# Patient Record
Sex: Female | Born: 1975 | Race: Black or African American | Hispanic: No | Marital: Single | Smoking: Never smoker
Health system: Southern US, Community
[De-identification: ages and names within clinical notes are randomized; demographics above are authoritative.]

## PROBLEM LIST (undated history)

## (undated) DIAGNOSIS — C50919 Malignant neoplasm of unspecified site of unspecified female breast: Secondary | ICD-10-CM

## (undated) DIAGNOSIS — J45909 Unspecified asthma, uncomplicated: Secondary | ICD-10-CM

## (undated) DIAGNOSIS — Z8616 Personal history of COVID-19: Secondary | ICD-10-CM

## (undated) DIAGNOSIS — E785 Hyperlipidemia, unspecified: Secondary | ICD-10-CM

## (undated) DIAGNOSIS — Z923 Personal history of irradiation: Secondary | ICD-10-CM

## (undated) DIAGNOSIS — R51 Headache: Secondary | ICD-10-CM

## (undated) DIAGNOSIS — K219 Gastro-esophageal reflux disease without esophagitis: Secondary | ICD-10-CM

## (undated) DIAGNOSIS — S82852A Displaced trimalleolar fracture of left lower leg, initial encounter for closed fracture: Secondary | ICD-10-CM

## (undated) DIAGNOSIS — R7303 Prediabetes: Secondary | ICD-10-CM

## (undated) DIAGNOSIS — R519 Headache, unspecified: Secondary | ICD-10-CM

## (undated) DIAGNOSIS — I10 Essential (primary) hypertension: Secondary | ICD-10-CM

## (undated) DIAGNOSIS — C801 Malignant (primary) neoplasm, unspecified: Secondary | ICD-10-CM

## (undated) HISTORY — DX: Personal history of COVID-19: Z86.16

## (undated) HISTORY — PX: NO PAST SURGERIES: SHX2092

## (undated) HISTORY — DX: Prediabetes: R73.03

## (undated) HISTORY — DX: Hyperlipidemia, unspecified: E78.5

## (undated) HISTORY — DX: Gastro-esophageal reflux disease without esophagitis: K21.9

## (undated) HISTORY — DX: Malignant neoplasm of unspecified site of unspecified female breast: C50.919

---

## 1998-12-16 DIAGNOSIS — T7840XA Allergy, unspecified, initial encounter: Secondary | ICD-10-CM

## 1998-12-16 HISTORY — DX: Allergy, unspecified, initial encounter: T78.40XA

## 2000-06-05 ENCOUNTER — Other Ambulatory Visit: Admission: RE | Admit: 2000-06-05 | Discharge: 2000-06-05 | Payer: Self-pay | Admitting: Internal Medicine

## 2001-08-13 ENCOUNTER — Other Ambulatory Visit: Admission: RE | Admit: 2001-08-13 | Discharge: 2001-08-13 | Payer: Self-pay | Admitting: Internal Medicine

## 2002-10-06 ENCOUNTER — Other Ambulatory Visit: Admission: RE | Admit: 2002-10-06 | Discharge: 2002-10-06 | Payer: Self-pay | Admitting: Internal Medicine

## 2004-01-13 ENCOUNTER — Other Ambulatory Visit: Admission: RE | Admit: 2004-01-13 | Discharge: 2004-01-13 | Payer: Self-pay | Admitting: Internal Medicine

## 2004-10-17 ENCOUNTER — Ambulatory Visit: Payer: Self-pay | Admitting: Internal Medicine

## 2005-03-11 ENCOUNTER — Other Ambulatory Visit: Admission: RE | Admit: 2005-03-11 | Discharge: 2005-03-11 | Payer: Self-pay | Admitting: Internal Medicine

## 2005-03-11 ENCOUNTER — Ambulatory Visit: Payer: Self-pay | Admitting: Internal Medicine

## 2005-07-16 ENCOUNTER — Ambulatory Visit: Payer: Self-pay | Admitting: Internal Medicine

## 2006-03-20 ENCOUNTER — Ambulatory Visit (HOSPITAL_COMMUNITY): Admission: RE | Admit: 2006-03-20 | Discharge: 2006-03-20 | Payer: Self-pay | Admitting: Obstetrics and Gynecology

## 2006-04-10 ENCOUNTER — Inpatient Hospital Stay (HOSPITAL_COMMUNITY): Admission: AD | Admit: 2006-04-10 | Discharge: 2006-04-10 | Payer: Self-pay | Admitting: *Deleted

## 2006-04-11 ENCOUNTER — Inpatient Hospital Stay (HOSPITAL_COMMUNITY): Admission: AD | Admit: 2006-04-11 | Discharge: 2006-04-12 | Payer: Self-pay | Admitting: Obstetrics and Gynecology

## 2006-04-11 ENCOUNTER — Encounter (INDEPENDENT_AMBULATORY_CARE_PROVIDER_SITE_OTHER): Payer: Self-pay | Admitting: Specialist

## 2006-11-12 ENCOUNTER — Ambulatory Visit: Payer: Self-pay | Admitting: Internal Medicine

## 2006-11-12 LAB — CONVERTED CEMR LAB
Alkaline Phosphatase: 58 units/L (ref 39–117)
Basophils Absolute: 0 10*3/uL (ref 0.0–0.1)
Creatinine, Ser: 0.8 mg/dL (ref 0.4–1.2)
GFR calc non Af Amer: 90 mL/min
Glucose, Bld: 105 mg/dL — ABNORMAL HIGH (ref 70–99)
Hemoglobin: 13.6 g/dL (ref 12.0–15.0)
MCHC: 33.3 g/dL (ref 30.0–36.0)
Monocytes Absolute: 0.4 10*3/uL (ref 0.2–0.7)
Monocytes Relative: 4.5 % (ref 3.0–11.0)
Neutro Abs: 6.5 10*3/uL (ref 1.4–7.7)
Neutrophils Relative %: 73.7 % (ref 43.0–77.0)
Platelets: 263 10*3/uL (ref 150–400)
RBC: 4.48 M/uL (ref 3.87–5.11)
RDW: 13 % (ref 11.5–14.6)
Sodium: 143 meq/L (ref 135–145)
TSH: 0.57 microintl units/mL (ref 0.35–5.50)
Total Bilirubin: 0.9 mg/dL (ref 0.3–1.2)
Triglyceride fasting, serum: 104 mg/dL (ref 0–149)
VLDL: 21 mg/dL (ref 0–40)

## 2006-11-19 ENCOUNTER — Ambulatory Visit: Payer: Self-pay | Admitting: Internal Medicine

## 2008-06-23 ENCOUNTER — Telehealth: Payer: Self-pay | Admitting: *Deleted

## 2008-08-01 ENCOUNTER — Ambulatory Visit: Payer: Self-pay | Admitting: Internal Medicine

## 2008-08-01 DIAGNOSIS — N760 Acute vaginitis: Secondary | ICD-10-CM | POA: Insufficient documentation

## 2008-08-01 DIAGNOSIS — J309 Allergic rhinitis, unspecified: Secondary | ICD-10-CM | POA: Insufficient documentation

## 2008-08-01 DIAGNOSIS — R51 Headache: Secondary | ICD-10-CM

## 2008-08-01 DIAGNOSIS — R519 Headache, unspecified: Secondary | ICD-10-CM | POA: Insufficient documentation

## 2008-08-01 DIAGNOSIS — E669 Obesity, unspecified: Secondary | ICD-10-CM

## 2008-08-01 DIAGNOSIS — B009 Herpesviral infection, unspecified: Secondary | ICD-10-CM | POA: Insufficient documentation

## 2008-08-03 ENCOUNTER — Ambulatory Visit: Payer: Self-pay | Admitting: Internal Medicine

## 2008-08-03 DIAGNOSIS — E786 Lipoprotein deficiency: Secondary | ICD-10-CM | POA: Insufficient documentation

## 2008-08-04 LAB — CONVERTED CEMR LAB
ALT: 12 units/L (ref 0–35)
AST: 14 units/L (ref 0–37)
Albumin: 3.7 g/dL (ref 3.5–5.2)
Alkaline Phosphatase: 49 units/L (ref 39–117)
BUN: 6 mg/dL (ref 6–23)
Basophils Relative: 0.4 % (ref 0.0–3.0)
Chloride: 109 meq/L (ref 96–112)
Cholesterol: 168 mg/dL (ref 0–200)
Creatinine, Ser: 0.8 mg/dL (ref 0.4–1.2)
Eosinophils Absolute: 0.1 10*3/uL (ref 0.0–0.7)
GFR calc Af Amer: 108 mL/min
GFR calc non Af Amer: 89 mL/min
LDL Cholesterol: 127 mg/dL — ABNORMAL HIGH (ref 0–99)
Potassium: 4 meq/L (ref 3.5–5.1)
RBC: 4.34 M/uL (ref 3.87–5.11)
RDW: 13.1 % (ref 11.5–14.6)
Sodium: 142 meq/L (ref 135–145)
TSH: 0.68 microintl units/mL (ref 0.35–5.50)
Total Bilirubin: 0.7 mg/dL (ref 0.3–1.2)
Total Protein: 6.9 g/dL (ref 6.0–8.3)
VLDL: 15 mg/dL (ref 0–40)
WBC: 8.8 10*3/uL (ref 4.5–10.5)

## 2009-08-14 ENCOUNTER — Telehealth: Payer: Self-pay | Admitting: Internal Medicine

## 2009-09-07 ENCOUNTER — Ambulatory Visit: Payer: Self-pay | Admitting: Internal Medicine

## 2009-09-07 DIAGNOSIS — J019 Acute sinusitis, unspecified: Secondary | ICD-10-CM | POA: Insufficient documentation

## 2009-09-07 DIAGNOSIS — J029 Acute pharyngitis, unspecified: Secondary | ICD-10-CM | POA: Insufficient documentation

## 2009-09-22 ENCOUNTER — Ambulatory Visit: Payer: Self-pay | Admitting: Internal Medicine

## 2012-03-24 ENCOUNTER — Emergency Department (HOSPITAL_COMMUNITY): Admission: EM | Admit: 2012-03-24 | Discharge: 2012-03-24 | Payer: Self-pay | Source: Home / Self Care

## 2015-01-30 ENCOUNTER — Encounter (HOSPITAL_COMMUNITY): Payer: Self-pay | Admitting: Emergency Medicine

## 2015-01-30 ENCOUNTER — Emergency Department (HOSPITAL_COMMUNITY): Payer: Medicaid Other

## 2015-01-30 ENCOUNTER — Emergency Department (HOSPITAL_COMMUNITY)
Admission: EM | Admit: 2015-01-30 | Discharge: 2015-01-30 | Disposition: A | Discharge status: Home / Self Care | Payer: Medicaid Other | Attending: Emergency Medicine | Admitting: Emergency Medicine

## 2015-01-30 DIAGNOSIS — R109 Unspecified abdominal pain: Secondary | ICD-10-CM | POA: Insufficient documentation

## 2015-01-30 DIAGNOSIS — Z79899 Other long term (current) drug therapy: Secondary | ICD-10-CM | POA: Insufficient documentation

## 2015-01-30 DIAGNOSIS — Z7982 Long term (current) use of aspirin: Secondary | ICD-10-CM | POA: Insufficient documentation

## 2015-01-30 DIAGNOSIS — Z3202 Encounter for pregnancy test, result negative: Secondary | ICD-10-CM | POA: Insufficient documentation

## 2015-01-30 LAB — CBC
HCT: 39.3 % (ref 36.0–46.0)
Hemoglobin: 13.2 g/dL (ref 12.0–15.0)
MCH: 30.2 pg (ref 26.0–34.0)
MCHC: 33.6 g/dL (ref 30.0–36.0)
MCV: 89.9 fL (ref 78.0–100.0)
PLATELETS: 297 10*3/uL (ref 150–400)
RBC: 4.37 MIL/uL (ref 3.87–5.11)
RDW: 14.1 % (ref 11.5–15.5)
WBC: 14.3 10*3/uL — AB (ref 4.0–10.5)

## 2015-01-30 LAB — URINALYSIS, ROUTINE W REFLEX MICROSCOPIC
BILIRUBIN URINE: NEGATIVE
GLUCOSE, UA: NEGATIVE mg/dL
HGB URINE DIPSTICK: NEGATIVE
Ketones, ur: NEGATIVE mg/dL
NITRITE: NEGATIVE
Protein, ur: NEGATIVE mg/dL
SPECIFIC GRAVITY, URINE: 1.011 (ref 1.005–1.030)
UROBILINOGEN UA: 0.2 mg/dL (ref 0.0–1.0)
pH: 7.5 (ref 5.0–8.0)

## 2015-01-30 LAB — URINE MICROSCOPIC-ADD ON

## 2015-01-30 LAB — BASIC METABOLIC PANEL
Anion gap: 8 (ref 5–15)
BUN: 6 mg/dL (ref 6–23)
CALCIUM: 8.6 mg/dL (ref 8.4–10.5)
CHLORIDE: 107 mmol/L (ref 96–112)
CO2: 23 mmol/L (ref 19–32)
Creatinine, Ser: 0.84 mg/dL (ref 0.50–1.10)
GFR, EST NON AFRICAN AMERICAN: 88 mL/min — AB (ref >90)
GLUCOSE: 103 mg/dL — AB (ref 70–99)
POTASSIUM: 3.9 mmol/L (ref 3.5–5.1)
Sodium: 138 mmol/L (ref 135–145)

## 2015-01-30 LAB — POC URINE PREG, ED: PREG TEST UR: NEGATIVE

## 2015-01-30 MED ORDER — CIPROFLOXACIN HCL 500 MG PO TABS: 500.0000 mg | ORAL_TABLET | Freq: Two times a day (BID) | ORAL | Status: DC

## 2015-01-30 MED ORDER — MORPHINE SULFATE 4 MG/ML IJ SOLN
4.0000 mg | Freq: Once | INTRAMUSCULAR | Status: AC
  Administered 2015-01-30: 4 mg via INTRAVENOUS
  Filled 2015-01-30: qty 1

## 2015-01-30 MED ORDER — HYDROCODONE-ACETAMINOPHEN 5-325 MG PO TABS: 1.0000 | ORAL_TABLET | Freq: Four times a day (QID) | ORAL | Status: DC | PRN

## 2015-01-30 MED ORDER — SODIUM CHLORIDE 0.9 % IV BOLUS (SEPSIS)
1000.0000 mL | Freq: Once | INTRAVENOUS | Status: AC
  Administered 2015-01-30: 1000 mL via INTRAVENOUS

## 2015-01-30 MED ORDER — DEXTROSE 5 % IV SOLN
1.0000 g | Freq: Once | INTRAVENOUS | Status: AC
  Administered 2015-01-30: 1 g via INTRAVENOUS
  Filled 2015-01-30: qty 10

## 2015-01-30 MED ORDER — ONDANSETRON HCL 4 MG PO TABS: 4.0000 mg | ORAL_TABLET | Freq: Four times a day (QID) | ORAL | Status: DC

## 2015-01-30 MED ORDER — ONDANSETRON HCL 4 MG/2ML IJ SOLN
4.0000 mg | Freq: Once | INTRAMUSCULAR | Status: AC
  Administered 2015-01-30: 4 mg via INTRAVENOUS
  Filled 2015-01-30: qty 2

## 2015-01-30 NOTE — ED Notes (Signed)
Per pt, states right flank pain since yesterday, increased urination

## 2015-01-30 NOTE — ED Provider Notes (Signed)
CSN: [id number]     Arrival date & time 01/30/15  1253 History   First MD Initiated Contact with Patient 01/30/15 1311     Chief Complaint  Patient presents with  . Flank Pain     (Consider location/radiation/quality/duration/timing/severity/associated sxs/prior Treatment) HPI   Diane Miranda is a 39 y.o. female complaining of 10 out of 10 right flank pain radiating around to the far lateral side onset yesterday. No pain medication is taken prior to arrival, no exacerbating or alleviating factors identified. Patient reports dysuria, denies urinary frequency, foul-smelling more concentrated urine. She denies fever, chills, nausea, vomiting, abnormal vaginal discharge, prior history of kidney stones, cough, chest pain, shortness of breath, abdominal pain, rash, cervicalgia, headache.  History reviewed. No pertinent past medical history. History reviewed. No pertinent past surgical history. No family history on file. History  Substance Use Topics  . Smoking status: Never Smoker   . Smokeless tobacco: Not on file  . Alcohol Use: No   OB History    No data available     Review of Systems  10 systems reviewed and found to be negative, except as noted in the HPI.   Allergies  Review of patient's allergies indicates no known allergies.  Home Medications   Prior to Admission medications   Medication Sig Start Date End Date Taking? Authorizing Provider  aspirin 325 MG tablet Take 325 mg by mouth once.   Yes Historical Provider, MD  ibuprofen (ADVIL,MOTRIN) 200 MG tablet Take 400 mg by mouth every 6 (six) hours as needed for headache or moderate pain.   Yes Historical Provider, MD  levonorgestrel (MIRENA) 20 MCG/24HR IUD 1 each by Intrauterine route once.   Yes Historical Provider, MD  OVER THE COUNTER MEDICATION Take 1 tablet by mouth daily. protandim   Yes Historical Provider, MD  sodium chloride (OCEAN) 0.65 % SOLN nasal spray Place 2 sprays into both nostrils daily as needed  for congestion.   Yes Historical Provider, MD   BP 149/91 mmHg  Pulse 112  Temp(Src) 98.5 F (36.9 C) (Oral)  Resp 18  SpO2 100%  LMP 01/16/2015 Physical Exam  Constitutional: She is oriented to person, place, and time. She appears well-developed and well-nourished. No distress.  Reclining comfortably in bed, watching TV  HENT:  Head: Normocephalic and atraumatic.  Mouth/Throat: Oropharynx is clear and moist.  Eyes: Conjunctivae and EOM are normal. Pupils are equal, round, and reactive to light.  Neck: Normal range of motion.  Cardiovascular: Normal rate, regular rhythm and intact distal pulses.   Pulmonary/Chest: Effort normal and breath sounds normal. No stridor. No respiratory distress. She has no wheezes. She has no rales. She exhibits no tenderness.  Abdominal: Soft. Bowel sounds are normal. She exhibits no distension and no mass. There is no tenderness. There is no rebound and no guarding.  Genitourinary:  No CVA tenderness to palpation bilaterally  Musculoskeletal: Normal range of motion.  Neurological: She is alert and oriented to person, place, and time.  Psychiatric: She has a normal mood and affect.  Nursing note and vitals reviewed.   ED Course  Procedures (including critical care time) Labs Review Labs Reviewed  CBC - Abnormal; Notable for the following:    WBC 14.3 (*)    All other components within normal limits  BASIC METABOLIC PANEL  URINALYSIS, ROUTINE W REFLEX MICROSCOPIC  POC URINE PREG, ED    Imaging Review No results found.   EKG Interpretation None      MDM  Final diagnoses:  None    Filed Vitals:   01/30/15 1300  BP: 149/91  Pulse: 112  Temp: 98.5 F (36.9 C)  TempSrc: Oral  Resp: 18  SpO2: 100%    Medications  sodium chloride 0.9 % bolus 1,000 mL (1,000 mLs Intravenous New Bag/Given 01/30/15 1333)  morphine 4 MG/ML injection 4 mg (4 mg Intravenous Given 01/30/15 1404)  ondansetron (ZOFRAN) injection 4 mg (4 mg Intravenous  Given 01/30/15 1403)    [name] is a pleasant 39 y.o. female presenting with right flank pain and dysuria onset yesterday. Patient is afebrile and mildly tachycardic at 112. Urinalysis does not appear to be infected, urine culture is ordered. She has a white count of 14.5. Will obtain renal ultrasound to evaluate for hydronephrosis.  Case signed out to PA [place] at shift change: Plan is to follow-up renal ultrasound, recheck vitals.      Monico Blitz, PA-C 01/30/15 [place], MD 01/31/15 [phone]

## 2015-01-30 NOTE — Discharge Instructions (Signed)
Pyelonephritis, Adult Pyelonephritis is a kidney infection. In general, there are 2 main types of pyelonephritis:  Infections that come on quickly without any warning (acute pyelonephritis).  Infections that persist for a long period of time (chronic pyelonephritis). CAUSES  Two main causes of pyelonephritis are:  Bacteria traveling from the bladder to the kidney. This is a problem especially in pregnant women. The urine in the bladder can become filled with bacteria from multiple causes, including:  Inflammation of the prostate gland (prostatitis).  Sexual intercourse in females.  Bladder infection (cystitis).  Bacteria traveling from the bloodstream to the tissue part of the kidney. Problems that may increase your risk of getting a kidney infection include:  Diabetes.  Kidney stones or bladder stones.  Cancer.  Catheters placed in the bladder.  Other abnormalities of the kidney or ureter. SYMPTOMS   Abdominal pain.  Pain in the side or flank area.  Fever.  Chills.  Upset stomach.  Blood in the urine (dark urine).  Frequent urination.  Strong or persistent urge to urinate.  Burning or stinging when urinating. DIAGNOSIS  Your caregiver may diagnose your kidney infection based on your symptoms. A urine sample may also be taken. TREATMENT  In general, treatment depends on how severe the infection is.   If the infection is mild and caught early, your caregiver may treat you with oral antibiotics and send you home.  If the infection is more severe, the bacteria may have gotten into the bloodstream. This will require intravenous (IV) antibiotics and a hospital stay. Symptoms may include:  High fever.  Severe flank pain.  Shaking chills.  Even after a hospital stay, your caregiver may require you to be on oral antibiotics for a period of time.  Other treatments may be required depending upon the cause of the infection. HOME CARE INSTRUCTIONS   Take your  antibiotics as directed. Finish them even if you start to feel better.  Make an appointment to have your urine checked to make sure the infection is gone.  Drink enough fluids to keep your urine clear or pale yellow.  Take medicines for the bladder if you have urgency and frequency of urination as directed by your caregiver. SEEK IMMEDIATE MEDICAL CARE IF:   You have a fever or persistent symptoms for more than 2-3 days.  You have a fever and your symptoms suddenly get worse.  You are unable to take your antibiotics or fluids.  You develop shaking chills.  You experience extreme weakness or fainting.  There is no improvement after 2 days of treatment. MAKE SURE YOU:  Understand these instructions.  Will watch your condition.  Will get help right away if you are not doing well or get worse. Document Released: 12/02/2005 Document Revised: 06/02/2012 Document Reviewed: 05/08/2011 [facility] Patient Information 2015 [place], [place]. This information is not intended to replace advice given to you by your health care provider. Make sure you discuss any questions you have with your health care provider.  Flank Pain Flank pain refers to pain that is located on the side of the body between the upper abdomen and the back. The pain may occur over a short period of time (acute) or may be long-term or reoccurring (chronic). It may be mild or severe. Flank pain can be caused by many things. CAUSES  Some of the more common causes of flank pain include:  Muscle strains.   Muscle spasms.   A disease of your spine (vertebral disk disease).   A  lung infection (pneumonia).   Fluid around your lungs (pulmonary edema).   A kidney infection.   Kidney stones.   A very painful skin rash caused by the chickenpox virus (shingles).   Gallbladder disease.  [place] care will depend on the cause of your pain. In general,  Rest as directed by your  caregiver.  Drink enough fluids to keep your urine clear or pale yellow.  Only take over-the-counter or prescription medicines as directed by your caregiver. Some medicines may help relieve the pain.  Tell your caregiver about any changes in your pain.  Follow up with your caregiver as directed. SEEK IMMEDIATE MEDICAL CARE IF:   Your pain is not controlled with medicine.   You have new or worsening symptoms.  Your pain increases.   You have abdominal pain.   You have shortness of breath.   You have persistent nausea or vomiting.   You have swelling in your abdomen.   You feel faint or pass out.   You have blood in your urine.  You have a fever or persistent symptoms for more than 2-3 days.  You have a fever and your symptoms suddenly get worse. MAKE SURE YOU:   Understand these instructions.  Will watch your condition.  Will get help right away if you are not doing well or get worse. Document Released: 01/23/2006 Document Revised: 08/26/2012 Document Reviewed: 07/16/2012 [facility] Patient Information 2015 [place], [place]. This information is not intended to replace advice given to you by your health care provider. Make sure you discuss any questions you have with your health care provider.

## 2015-02-03 LAB — URINE CULTURE: Colony Count: 80000

## 2015-02-07 ENCOUNTER — Telehealth (HOSPITAL_COMMUNITY): Payer: Self-pay

## 2015-02-07 NOTE — Telephone Encounter (Signed)
Post ED Visit - Positive Culture Follow-up  Culture report reviewed by antimicrobial stewardship pharmacist: []  Wes Redmon, Pharm.D., BCPS []  Heide Guile, Pharm.D., BCPS []  Alycia Rossetti, Pharm.D., BCPS []  [place], Pharm.D., BCPS, AAHIVP [x]  Legrand Como, Pharm.D., BCPS, AAHIVP []  Isac Sarna, Pharm.D., BCPS  Positive Urine culture, >/= 80,000 colonies -> Staph Species Treated with Ciprofloxacin, organism sensitive to the same and no further patient follow-up is required at this time.  Dortha Kern 02/07/2015, 5:27 AM

## 2016-11-19 ENCOUNTER — Ambulatory Visit (HOSPITAL_COMMUNITY)
Admission: EM | Admit: 2016-11-19 | Discharge: 2016-11-19 | Disposition: A | Discharge status: Home / Self Care | Payer: BLUE CROSS/BLUE SHIELD | Attending: Family Medicine | Admitting: Family Medicine

## 2016-11-19 ENCOUNTER — Encounter (HOSPITAL_COMMUNITY): Payer: Self-pay | Admitting: Emergency Medicine

## 2016-11-19 DIAGNOSIS — H6503 Acute serous otitis media, bilateral: Secondary | ICD-10-CM | POA: Diagnosis not present

## 2016-11-19 MED ORDER — FLUCONAZOLE 150 MG PO TABS: 150.0000 mg | ORAL_TABLET | Freq: Every day | ORAL | 0 refills | Status: DC

## 2016-11-19 MED ORDER — AMOXICILLIN 875 MG PO TABS: 875.0000 mg | ORAL_TABLET | Freq: Two times a day (BID) | ORAL | 0 refills | Status: DC

## 2016-11-19 MED ORDER — IPRATROPIUM BROMIDE 0.06 % NA SOLN: 2.0000 | Freq: Four times a day (QID) | NASAL | 0 refills | Status: DC

## 2016-11-19 NOTE — ED Triage Notes (Signed)
See provider note. 

## 2016-11-19 NOTE — ED Provider Notes (Signed)
CSN: [id number]     Arrival date & time 11/19/16  1848 History   First MD Initiated Contact with Patient 11/19/16 1935     No chief complaint on file.  (Consider location/radiation/quality/duration/timing/severity/associated sxs/prior Treatment) Patient c/o decreased hearing acuity and congestion.   The history is provided by the patient.    No past medical history on file. No past surgical history on file. No family history on file. Social History  Substance Use Topics  . Smoking status: Never Smoker  . Smokeless tobacco: Not on file  . Alcohol use No   OB History    No data available     Review of Systems  Constitutional: Negative.   HENT: Positive for ear pain, postnasal drip, sneezing and sore throat.   Eyes: Negative.   Respiratory: Negative.   Cardiovascular: Negative.   Gastrointestinal: Negative.   Endocrine: Negative.   Genitourinary: Negative.   Musculoskeletal: Negative.   Allergic/Immunologic: Negative.   Neurological: Negative.   Hematological: Negative.   Psychiatric/Behavioral: Negative.     Allergies  Patient has no known allergies.  Home Medications   Prior to Admission medications   Medication Sig Start Date End Date Taking? Authorizing Provider  amoxicillin (AMOXIL) 875 MG tablet Take 1 tablet (875 mg total) by mouth 2 (two) times daily. 11/19/16   Lysbeth Penner, FNP  aspirin 325 MG tablet Take 325 mg by mouth once.    Historical Provider, MD  ciprofloxacin (CIPRO) 500 MG tablet Take 1 tablet (500 mg total) by mouth 2 (two) times daily. One po bid x 7 days 01/30/15   Dahlia Bailiff, PA-C  HYDROcodone-acetaminophen (NORCO/VICODIN) 5-325 MG per tablet Take 1-2 tablets by mouth every 6 (six) hours as needed for moderate pain or severe pain. 01/30/15   Dahlia Bailiff, PA-C  ibuprofen (ADVIL,MOTRIN) 200 MG tablet Take 400 mg by mouth every 6 (six) hours as needed for headache or moderate pain.    Historical Provider, MD  ipratropium (ATROVENT) 0.06 % nasal  spray Place 2 sprays into both nostrils 4 (four) times daily. 11/19/16   Lysbeth Penner, FNP  levonorgestrel (MIRENA) 20 MCG/24HR IUD 1 each by Intrauterine route once.    Historical Provider, MD  ondansetron (ZOFRAN) 4 MG tablet Take 1 tablet (4 mg total) by mouth every 6 (six) hours. 01/30/15   Dahlia Bailiff, PA-C  OVER THE COUNTER MEDICATION Take 1 tablet by mouth daily. protandim    Historical Provider, MD  sodium chloride (OCEAN) 0.65 % SOLN nasal spray Place 2 sprays into both nostrils daily as needed for congestion.    Historical Provider, MD   Meds Ordered and Administered this Visit  Medications - No data to display  BP 151/93 (BP Location: Left Arm)   Pulse 89   Temp 99 F (37.2 C) (Oral)   Resp 14   SpO2 98%  No data found.   Physical Exam  Constitutional: She is oriented to person, place, and time. She appears well-developed and well-nourished.  HENT:  Head: Normocephalic and atraumatic.  Right Ear: External ear normal.  Left Ear: External ear normal.  Mouth/Throat: Oropharynx is clear and moist.  Eyes: Conjunctivae and EOM are normal. Pupils are equal, round, and reactive to light.  Neck: Normal range of motion. Neck supple.  Cardiovascular: Normal rate, regular rhythm and normal heart sounds.   Pulmonary/Chest: Effort normal and breath sounds normal.  Abdominal: Soft. Bowel sounds are normal.  Neurological: She is alert and oriented to person, place, and time.  Nursing note and vitals reviewed.   Urgent Care Course   Clinical Course     Procedures (including critical care time)  Labs Review Labs Reviewed - No data to display  Imaging Review No results found.   Visual Acuity Review  Right Eye Distance:   Left Eye Distance:   Bilateral Distance:    Right Eye Near:   Left Eye Near:    Bilateral Near:         MDM   1. Bilateral acute serous otitis media, recurrence not specified    Amoxicillin 875mg  one po bid x 7 days #14 Diflucan 150mg  one po  qd #2 Push po fluids, rest, tylenol and motrin otc prn as directed for fever, arthralgias, and myalgias.  Follow up prn if sx's continue or persist.    Lysbeth Penner, FNP 11/19/16 1949

## 2016-12-16 HISTORY — PX: FRACTURE SURGERY: SHX138

## 2017-07-04 ENCOUNTER — Emergency Department (HOSPITAL_COMMUNITY)
Admission: EM | Admit: 2017-07-04 | Discharge: 2017-07-05 | Disposition: A | Discharge status: Home / Self Care | Payer: No Typology Code available for payment source | Attending: Emergency Medicine | Admitting: Emergency Medicine

## 2017-07-04 ENCOUNTER — Encounter (HOSPITAL_COMMUNITY): Payer: Self-pay | Admitting: Emergency Medicine

## 2017-07-04 ENCOUNTER — Emergency Department (HOSPITAL_COMMUNITY): Payer: No Typology Code available for payment source

## 2017-07-04 DIAGNOSIS — Z79899 Other long term (current) drug therapy: Secondary | ICD-10-CM | POA: Diagnosis not present

## 2017-07-04 DIAGNOSIS — S82842A Displaced bimalleolar fracture of left lower leg, initial encounter for closed fracture: Secondary | ICD-10-CM | POA: Insufficient documentation

## 2017-07-04 DIAGNOSIS — S92255A Nondisplaced fracture of navicular [scaphoid] of left foot, initial encounter for closed fracture: Secondary | ICD-10-CM | POA: Diagnosis not present

## 2017-07-04 DIAGNOSIS — S99922A Unspecified injury of left foot, initial encounter: Secondary | ICD-10-CM | POA: Diagnosis present

## 2017-07-04 DIAGNOSIS — X500XXA Overexertion from strenuous movement or load, initial encounter: Secondary | ICD-10-CM | POA: Diagnosis not present

## 2017-07-04 DIAGNOSIS — Y999 Unspecified external cause status: Secondary | ICD-10-CM | POA: Diagnosis not present

## 2017-07-04 DIAGNOSIS — Y9389 Activity, other specified: Secondary | ICD-10-CM | POA: Insufficient documentation

## 2017-07-04 DIAGNOSIS — Y92830 Public park as the place of occurrence of the external cause: Secondary | ICD-10-CM | POA: Insufficient documentation

## 2017-07-04 MED ORDER — KETAMINE HCL-SODIUM CHLORIDE 100-0.9 MG/10ML-% IV SOSY
1.0000 mg/kg | PREFILLED_SYRINGE | Freq: Once | INTRAVENOUS | Status: AC
  Administered 2017-07-04: 100 mg via INTRAVENOUS
  Filled 2017-07-04: qty 20

## 2017-07-04 MED ORDER — SODIUM CHLORIDE 0.9 % IV SOLN
INTRAVENOUS | Status: DC
  Administered 2017-07-04: 23:00:00 via INTRAVENOUS

## 2017-07-04 MED ORDER — PROPOFOL 10 MG/ML IV BOLUS
0.5000 mg/kg | Freq: Once | INTRAVENOUS | Status: AC
  Administered 2017-07-04: 40 mg via INTRAVENOUS
  Filled 2017-07-04: qty 20

## 2017-07-04 MED ORDER — KETAMINE HCL 10 MG/ML IJ SOLN
INTRAMUSCULAR | Status: AC | PRN
  Administered 2017-07-04: 204.2 mg via INTRAVENOUS

## 2017-07-04 MED ORDER — HYDROMORPHONE HCL 1 MG/ML IJ SOLN
1.0000 mg | Freq: Once | INTRAMUSCULAR | Status: AC
  Administered 2017-07-04: 1 mg via INTRAMUSCULAR
  Filled 2017-07-04: qty 1

## 2017-07-04 MED ORDER — PROPOFOL 10 MG/ML IV BOLUS
INTRAVENOUS | Status: AC | PRN
  Administered 2017-07-04: 51.05 mg via INTRAVENOUS

## 2017-07-04 NOTE — ED Provider Notes (Signed)
[facility] DEPT Provider Note   CSN: [id number] Arrival date & time: 07/04/17  2104     History   Chief Complaint Chief Complaint  Patient presents with  . Foot Injury    HPI Diane Miranda is a 41 y.o. female who presents with L ankle and foot pain. She states she was with her family at movie night at [employer] when the movie screen fell and landed on her L ankle and foot. She had immediate pain and swelling and heard a "pop" when she tried to get out of the way. She was unable to bear weight after the incident. She denies numbness and is able to wiggle all her toes.  HPI  History reviewed. No pertinent past medical history.  Patient Active Problem List   Diagnosis Date Noted  . SINUSITIS - ACUTE-NOS 09/07/2009  . EXUDATIVE PHARYNGITIS 09/07/2009  . LOW HDL 08/03/2008  . 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 OBESITY 08/01/2008  . ALLERGIC RHINITIS 08/01/2008  . VAGINITIS 08/01/2008  . HEADACHE 08/01/2008    History reviewed. No pertinent surgical history.  OB History    No data available       Home Medications    Prior to Admission medications   Medication Sig Start Date End Date Taking? Authorizing Provider  aspirin 325 MG tablet Take 325 mg by mouth once.    [provider]  ibuprofen (ADVIL,MOTRIN) 200 MG tablet Take 400 mg by mouth every 6 (six) hours as needed for headache or moderate pain.    [provider]  ipratropium (ATROVENT) 0.06 % nasal spray Place 2 sprays into both nostrils 4 (four) times daily. 11/19/16   Lysbeth Penner, FNP  levonorgestrel (MIRENA) 20 MCG/24HR IUD 1 each by Intrauterine route once.    [provider]  OVER THE COUNTER MEDICATION Take 1 tablet by mouth daily. protandim    [provider]  sodium chloride (OCEAN) 0.65 % SOLN nasal spray Place 2 sprays into both nostrils daily as needed for congestion.    [provider]    Family History History reviewed. No pertinent family  history.  Social History Social History  Substance Use Topics  . Smoking status: Never Smoker  . Smokeless tobacco: Never Used  . Alcohol use No     Allergies   Patient has no known allergies.   Review of Systems Review of Systems  Musculoskeletal: Positive for arthralgias, gait problem and joint swelling.  Skin: Negative for wound.  Neurological: Negative for weakness and numbness.     Physical Exam Updated Vital Signs BP (!) 149/90 (BP Location: Right Arm)   Pulse 94   Temp 99.2 F (37.3 C) (Oral)   Resp 20   Ht 5\' 4"  (1.626 m)   Wt 102.1 kg (225 lb)   LMP 07/02/2017   SpO2 100%   BMI 38.62 kg/m   Physical Exam  Constitutional: She is oriented to person, place, and time. She appears well-developed and well-nourished. No distress.  HENT:  Head: Normocephalic and atraumatic.  Eyes: Pupils are equal, round, and reactive to light. Conjunctivae are normal. Right eye exhibits no discharge. Left eye exhibits no discharge. No scleral icterus.  Neck: Normal range of motion.  Cardiovascular: Normal rate.   Pulmonary/Chest: Effort normal. No respiratory distress.  Abdominal: She exhibits no distension.  Musculoskeletal:  Left ankle and foot: Significant amount of swelling of foot and ankle. Diffuse tenderness to palpation. Able to wiggle all toes. N/V intact.   Neurological: She is alert  and oriented to person, place, and time.  Skin: Skin is warm and dry.  Psychiatric: She has a normal mood and affect. Her behavior is normal.  Nursing note and vitals reviewed.    ED Treatments / Results  Labs (all labs ordered are listed, but only abnormal results are displayed) Labs Reviewed - No data to display  EKG  EKG Interpretation None       Radiology Dg Ankle Complete Left  Result Date: 07/04/2017 CLINICAL DATA:  Initial evaluation for acute trauma. EXAM: LEFT ANKLE COMPLETE - 3+ VIEW COMPARISON:  None. FINDINGS: Acute transverse mildly displaced fracture through  the medial malleolus. Additional acute comminuted oblique fracture through the distal fibula with mild lateral displacement. Posterior malleolus grossly intact. Disruption of the ankle mortise. Extensive soft tissue swelling about the ankle. IMPRESSION: Acute fracture dislocation about the left ankle as above. Electronically Signed   By: Jeannine Boga M.D.   On: 07/04/2017 22:02   Dg Foot Complete Left  Result Date: 07/04/2017 CLINICAL DATA:  Initial evaluation for acute injury. EXAM: LEFT FOOT - COMPLETE 3+ VIEW COMPARISON:  None. FINDINGS: Acute fractures about the ankle, better evaluated on concomitant ankle radiographs. There is linear lucency traversing the medial aspect of the navicular, suspicious for acute nondisplaced fracture. Overlying soft tissue swelling. No other acute fracture or dislocation about the foot. Small plantar calcaneal enthesophyte noted. IMPRESSION: 1. Linear lucency traversing the medial aspect of the navicular, suspicious for acute nondisplaced fracture. 2. Multiple acute fractures about the left ankle, better evaluated on concomitant ankle radiographs. Electronically Signed   By: Jeannine Boga M.D.   On: 07/04/2017 22:00    Procedures Procedures (including critical care time)  Medications Ordered in ED Medications  HYDROmorphone (DILAUDID) injection 1 mg (1 mg Intramuscular Given 07/04/17 2232)  propofol (DIPRIVAN) 10 mg/mL bolus/IV push 51.1 mg (40 mg Intravenous Given 07/04/17 2326)  ketamine 100 mg in normal saline 10 mL (10mg /mL) syringe (100 mg Intravenous Given 07/04/17 2324)  ketamine (KETALAR) injection (204.2 mg Intravenous Given 07/04/17 2324)  propofol (DIPRIVAN) 10 mg/mL bolus/IV push (51.05 mg Intravenous Given 07/04/17 2326)  ondansetron (ZOFRAN) injection 4 mg (4 mg Intravenous Given 07/05/17 0058)  ketorolac (TORADOL) 15 MG/ML injection 15 mg (15 mg Intravenous Given 07/05/17 0058)    Initial Impression / Assessment and Plan / ED Course  I  have reviewed the triage vital signs and the nursing notes.  Pertinent labs & imaging results that were available during my care of the patient were reviewed by me and considered in my medical decision making (see chart for details).  41 year old female with unstable L ankle and foot fx. Shared visit with Dr. Wilson Singer. Spoke with Dr. Doran Durand who recommends reduction, splint, CT of ankle and foot, pain control, elevation and office follow up Monday.   L ankle was reduced and splinted. Crutches were given. Pain rx and Ortho referral given. Return precautions were discussed.  Final Clinical Impressions(s) / ED Diagnoses   Final diagnoses:  Closed bimalleolar fracture, left, initial encounter  Closed nondisplaced fracture of navicular bone of left foot, initial encounter    New Prescriptions Current Discharge Medication List       Iris Pert 07/05/17 1525    Virgel Manifold, MD 07/12/17 1219

## 2017-07-04 NOTE — ED Notes (Signed)
Pt reports she was at [employer] watching a movie, states the screen was falling and she was right under it, so she attempted to get out of the way when she slipped on the sand twisting her L ankle.

## 2017-07-04 NOTE — ED Triage Notes (Signed)
Patient was at family night and a screen was falling. Patient was trying to get out of the way and twisted her left foot.

## 2017-07-04 NOTE — Progress Notes (Signed)
Orthopedic Tech Progress Note Patient Details:  Diane Miranda 28-Mar-1976 [id number]  Assisted reduction performed by Dr. Wilson Singer and PA Janetta Hora. Posterior and stirrup short leg splint applied. Crutches were left in the room, Ortho Tech did not do crutch training as Pt was under conscious sedation. Informed RN to please provide crutch training when Pt is more conscious.  Ortho Devices Type of Ortho Device: Ace wrap, Post (short leg) splint, Stirrup splint, Crutches Ortho Device/Splint Location: Well padded plaster posterior and stirrup splint to Lt Leg. Ortho Device/Splint Interventions: Application   Charlott Rakes 07/04/2017, 11:47 PM

## 2017-07-05 ENCOUNTER — Emergency Department (HOSPITAL_COMMUNITY): Payer: No Typology Code available for payment source

## 2017-07-05 MED ORDER — IBUPROFEN 600 MG PO TABS: 600.0000 mg | ORAL_TABLET | Freq: Four times a day (QID) | ORAL | 0 refills | Status: DC | PRN

## 2017-07-05 MED ORDER — OXYCODONE-ACETAMINOPHEN 5-325 MG PO TABS: 1.0000 | ORAL_TABLET | ORAL | 0 refills | Status: DC | PRN

## 2017-07-05 MED ORDER — KETOROLAC TROMETHAMINE 15 MG/ML IJ SOLN
15.0000 mg | Freq: Once | INTRAMUSCULAR | Status: AC
  Administered 2017-07-05: 15 mg via INTRAVENOUS
  Filled 2017-07-05: qty 1

## 2017-07-05 MED ORDER — ONDANSETRON HCL 4 MG/2ML IJ SOLN
4.0000 mg | Freq: Once | INTRAMUSCULAR | Status: AC
  Administered 2017-07-05: 4 mg via INTRAVENOUS
  Filled 2017-07-05: qty 2

## 2017-07-05 NOTE — ED Provider Notes (Signed)
Medical screening examination/treatment/procedure(s) were conducted as a shared visit with non-physician practitioner(s) and myself.  I personally evaluated the patient during the encounter.   EKG Interpretation None       41yF with closed trimalleolar fx L ankle. Reduced. Remains NVI post-reduction. Splinted. Crutches. PRN pain meds. CT per ortho request. FU with ortho this upcoming week.     Reduction of dislocation Date/Time: 11:00 PM Performed by: Virgel Manifold Authorized by: Virgel Manifold Consent: Verbal consent obtained. Risks and benefits: risks, benefits and alternatives were discussed Consent given by: patient Required items: required blood products, implants, devices, and special equipment available Time out: Immediately prior to procedure a "time out" was called to verify the correct patient, procedure, equipment, support staff and site/side marked as required.  Patient sedated: see MAR  Vitals: Vital signs were monitored during sedation. Patient tolerance: Patient tolerated the procedure well with no immediate complications. Joint: L ankle Reduction technique: axial traction   Procedural Sedation  Preprocedure  Pre-anesthesia/induction confirmation of laterality/correct procedure site including "time-out."  Provider confirms review of the nurses' note, allergies, medications, pertinent labs, PMH, pre-induction vital signs, pulse oximetry, pain level, and ECG (as applicable), and patient condition satisfactory for commencing with order for sedation and procedure.  Medications: Ketamine IV           Propofol IV  Patient tolerated procedure and procedural sedation component as expected without apparent immediate complications.  Physician confirms procedural medication orders as administered, patient was assessed by physician post-procedure, and confirms post-sedation plan of care and disposition.  Total time of sedation/monitoring: 35 minutes   Virgel Manifold,  MD 07/05/17 1447

## 2017-07-05 NOTE — Discharge Instructions (Signed)
Please elevate leg as much as possible this weekend. Leg should be above the level of the heart (toes above nose) Take Ibuprofen three times a day for pain/inflammation Take pain medicine as needed Do not put any weight on left leg  Follow up with Dr. Doran Durand on Monday - call his office Monday morning

## 2017-07-07 ENCOUNTER — Other Ambulatory Visit: Payer: Self-pay | Admitting: Orthopedic Surgery

## 2017-07-08 ENCOUNTER — Encounter (HOSPITAL_BASED_OUTPATIENT_CLINIC_OR_DEPARTMENT_OTHER): Payer: Self-pay | Admitting: *Deleted

## 2017-07-08 ENCOUNTER — Encounter (HOSPITAL_BASED_OUTPATIENT_CLINIC_OR_DEPARTMENT_OTHER)
Admission: RE | Admit: 2017-07-08 | Discharge: 2017-07-08 | Disposition: A | Discharge status: Home / Self Care | Payer: BLUE CROSS/BLUE SHIELD | Source: Ambulatory Visit | Attending: Orthopedic Surgery | Admitting: Orthopedic Surgery

## 2017-07-08 ENCOUNTER — Other Ambulatory Visit: Payer: Self-pay

## 2017-07-08 DIAGNOSIS — S82852A Displaced trimalleolar fracture of left lower leg, initial encounter for closed fracture: Secondary | ICD-10-CM | POA: Diagnosis present

## 2017-07-08 DIAGNOSIS — Z79899 Other long term (current) drug therapy: Secondary | ICD-10-CM | POA: Diagnosis not present

## 2017-07-08 DIAGNOSIS — X58XXXA Exposure to other specified factors, initial encounter: Secondary | ICD-10-CM | POA: Diagnosis not present

## 2017-07-08 DIAGNOSIS — Y929 Unspecified place or not applicable: Secondary | ICD-10-CM | POA: Diagnosis not present

## 2017-07-08 DIAGNOSIS — I10 Essential (primary) hypertension: Secondary | ICD-10-CM | POA: Diagnosis not present

## 2017-07-08 LAB — POCT I-STAT, CHEM 8
BUN: 3 mg/dL — ABNORMAL LOW (ref 6–20)
CALCIUM ION: 1.15 mmol/L (ref 1.15–1.40)
CREATININE: 0.7 mg/dL (ref 0.44–1.00)
Chloride: 104 mmol/L (ref 101–111)
GLUCOSE: 109 mg/dL — AB (ref 65–99)
HCT: 40 % (ref 36.0–46.0)
HEMOGLOBIN: 13.6 g/dL (ref 12.0–15.0)
POTASSIUM: 3.5 mmol/L (ref 3.5–5.1)
Sodium: 139 mmol/L (ref 135–145)
TCO2: 25 mmol/L (ref 0–100)

## 2017-07-08 NOTE — Progress Notes (Signed)
Pt is a difficult stick, S. Coakley RN drew I stat 8 on second attempt in right hand. Pt tolerated well.

## 2017-07-08 NOTE — Progress Notes (Signed)
Reviewed EKG with Dr Tobias Alexander. OK to proceed as planned with surgery

## 2017-07-10 ENCOUNTER — Ambulatory Visit (HOSPITAL_BASED_OUTPATIENT_CLINIC_OR_DEPARTMENT_OTHER): Payer: BLUE CROSS/BLUE SHIELD | Admitting: Certified Registered"

## 2017-07-10 ENCOUNTER — Encounter (HOSPITAL_BASED_OUTPATIENT_CLINIC_OR_DEPARTMENT_OTHER)
Admission: RE | Disposition: A | Discharge status: Home / Self Care | Payer: Self-pay | Source: Ambulatory Visit | Attending: Orthopedic Surgery

## 2017-07-10 ENCOUNTER — Encounter (HOSPITAL_BASED_OUTPATIENT_CLINIC_OR_DEPARTMENT_OTHER): Payer: Self-pay

## 2017-07-10 ENCOUNTER — Ambulatory Visit (HOSPITAL_BASED_OUTPATIENT_CLINIC_OR_DEPARTMENT_OTHER)
Admission: RE | Admit: 2017-07-10 | Discharge: 2017-07-10 | Disposition: A | Discharge status: Home / Self Care | Payer: BLUE CROSS/BLUE SHIELD | Source: Ambulatory Visit | Attending: Orthopedic Surgery | Admitting: Orthopedic Surgery

## 2017-07-10 DIAGNOSIS — X58XXXA Exposure to other specified factors, initial encounter: Secondary | ICD-10-CM | POA: Insufficient documentation

## 2017-07-10 DIAGNOSIS — S82852A Displaced trimalleolar fracture of left lower leg, initial encounter for closed fracture: Secondary | ICD-10-CM | POA: Insufficient documentation

## 2017-07-10 DIAGNOSIS — I10 Essential (primary) hypertension: Secondary | ICD-10-CM | POA: Insufficient documentation

## 2017-07-10 DIAGNOSIS — Y929 Unspecified place or not applicable: Secondary | ICD-10-CM | POA: Insufficient documentation

## 2017-07-10 DIAGNOSIS — Z79899 Other long term (current) drug therapy: Secondary | ICD-10-CM | POA: Insufficient documentation

## 2017-07-10 HISTORY — PX: ORIF ANKLE FRACTURE: SHX5408

## 2017-07-10 HISTORY — DX: Displaced trimalleolar fracture of left lower leg, initial encounter for closed fracture: S82.852A

## 2017-07-10 HISTORY — DX: Headache: R51

## 2017-07-10 HISTORY — DX: Essential (primary) hypertension: I10

## 2017-07-10 HISTORY — DX: Unspecified asthma, uncomplicated: J45.909

## 2017-07-10 HISTORY — DX: Headache, unspecified: R51.9

## 2017-07-10 SURGERY — OPEN REDUCTION INTERNAL FIXATION (ORIF) ANKLE FRACTURE
Anesthesia: General | Site: Ankle | Laterality: Left

## 2017-07-10 MED ORDER — CEFAZOLIN SODIUM-DEXTROSE 2-4 GM/100ML-% IV SOLN
2.0000 g | INTRAVENOUS | Status: AC
  Administered 2017-07-10: 2 g via INTRAVENOUS

## 2017-07-10 MED ORDER — LACTATED RINGERS IV SOLN
INTRAVENOUS | Status: DC
  Administered 2017-07-10 (×2): via INTRAVENOUS

## 2017-07-10 MED ORDER — FENTANYL CITRATE (PF) 100 MCG/2ML IJ SOLN
50.0000 ug | INTRAMUSCULAR | Status: DC | PRN
  Administered 2017-07-10: 50 ug via INTRAVENOUS
  Administered 2017-07-10: 25 ug via INTRAVENOUS

## 2017-07-10 MED ORDER — ASPIRIN EC 81 MG PO TBEC: 81.0000 mg | DELAYED_RELEASE_TABLET | Freq: Two times a day (BID) | ORAL | 0 refills | Status: DC

## 2017-07-10 MED ORDER — MIDAZOLAM HCL 2 MG/2ML IJ SOLN
INTRAMUSCULAR | Status: AC
  Filled 2017-07-10: qty 2

## 2017-07-10 MED ORDER — ROCURONIUM BROMIDE 10 MG/ML (PF) SYRINGE
PREFILLED_SYRINGE | INTRAVENOUS | Status: AC
  Filled 2017-07-10: qty 5

## 2017-07-10 MED ORDER — FENTANYL CITRATE (PF) 100 MCG/2ML IJ SOLN
INTRAMUSCULAR | Status: AC
  Filled 2017-07-10: qty 2

## 2017-07-10 MED ORDER — DOCUSATE SODIUM 100 MG PO CAPS: 100.0000 mg | ORAL_CAPSULE | Freq: Two times a day (BID) | ORAL | 0 refills | Status: DC

## 2017-07-10 MED ORDER — HYDROCODONE-ACETAMINOPHEN 5-325 MG PO TABS: 1.0000 | ORAL_TABLET | ORAL | 0 refills | Status: DC | PRN

## 2017-07-10 MED ORDER — DEXAMETHASONE SODIUM PHOSPHATE 10 MG/ML IJ SOLN
INTRAMUSCULAR | Status: DC | PRN
  Administered 2017-07-10: 10 mg via INTRAVENOUS

## 2017-07-10 MED ORDER — LIDOCAINE HCL (CARDIAC) 20 MG/ML IV SOLN
INTRAVENOUS | Status: DC | PRN
  Administered 2017-07-10: 30 mg via INTRAVENOUS

## 2017-07-10 MED ORDER — FENTANYL CITRATE (PF) 100 MCG/2ML IJ SOLN: 25.0000 ug | INTRAMUSCULAR | Status: DC | PRN

## 2017-07-10 MED ORDER — CHLORHEXIDINE GLUCONATE 4 % EX LIQD: 60.0000 mL | Freq: Once | CUTANEOUS | Status: DC

## 2017-07-10 MED ORDER — BUPIVACAINE-EPINEPHRINE (PF) 0.5% -1:200000 IJ SOLN
INTRAMUSCULAR | Status: DC | PRN
  Administered 2017-07-10: 30 mL via PERINEURAL

## 2017-07-10 MED ORDER — PROMETHAZINE HCL 25 MG/ML IJ SOLN: 6.2500 mg | INTRAMUSCULAR | Status: DC | PRN

## 2017-07-10 MED ORDER — SENNA 8.6 MG PO TABS
2.0000 | ORAL_TABLET | Freq: Two times a day (BID) | ORAL | 0 refills | Status: DC
Start: 2017-07-10 — End: 2021-03-14

## 2017-07-10 MED ORDER — SODIUM CHLORIDE 0.9 % IV SOLN: INTRAVENOUS | Status: DC

## 2017-07-10 MED ORDER — SCOPOLAMINE 1 MG/3DAYS TD PT72: 1.0000 | MEDICATED_PATCH | Freq: Once | TRANSDERMAL | Status: DC | PRN

## 2017-07-10 MED ORDER — EPHEDRINE 5 MG/ML INJ
INTRAVENOUS | Status: AC
  Filled 2017-07-10: qty 10

## 2017-07-10 MED ORDER — MIDAZOLAM HCL 2 MG/2ML IJ SOLN
1.0000 mg | INTRAMUSCULAR | Status: DC | PRN
  Administered 2017-07-10: 2 mg via INTRAVENOUS

## 2017-07-10 MED ORDER — ONDANSETRON HCL 4 MG/2ML IJ SOLN
INTRAMUSCULAR | Status: DC | PRN
  Administered 2017-07-10: 4 mg via INTRAVENOUS

## 2017-07-10 MED ORDER — PROPOFOL 10 MG/ML IV BOLUS
INTRAVENOUS | Status: DC | PRN
  Administered 2017-07-10: 200 mg via INTRAVENOUS

## 2017-07-10 MED ORDER — CEFAZOLIN SODIUM-DEXTROSE 2-4 GM/100ML-% IV SOLN
INTRAVENOUS | Status: AC
  Filled 2017-07-10: qty 100

## 2017-07-10 MED ORDER — PHENYLEPHRINE HCL 10 MG/ML IJ SOLN
INTRAMUSCULAR | Status: DC | PRN
  Administered 2017-07-10 (×2): 40 ug via INTRAVENOUS

## 2017-07-10 MED ORDER — KETOROLAC TROMETHAMINE 10 MG PO TABS
10.0000 mg | ORAL_TABLET | Freq: Four times a day (QID) | ORAL | 0 refills | Status: DC | PRN
Start: 2017-07-10 — End: 2021-03-14

## 2017-07-10 SURGICAL SUPPLY — 79 items
BANDAGE ESMARK 6X9 LF (GAUZE/BANDAGES/DRESSINGS) ×1 IMPLANT
BIT DRILL 2.5X2.75 QC CALB (BIT) ×2 IMPLANT
BIT DRILL 2.9 CANN QC NONSTRL (BIT) ×2 IMPLANT
BIT DRILL 3.5X5.5 QC CALB (BIT) ×2 IMPLANT
BLADE SURG 15 STRL LF DISP TIS (BLADE) ×2 IMPLANT
BLADE SURG 15 STRL SS (BLADE) ×6
BNDG CMPR 9X4 STRL LF SNTH (GAUZE/BANDAGES/DRESSINGS)
BNDG CMPR 9X6 STRL LF SNTH (GAUZE/BANDAGES/DRESSINGS) ×1
BNDG COHESIVE 4X5 TAN STRL (GAUZE/BANDAGES/DRESSINGS) ×3 IMPLANT
BNDG COHESIVE 6X5 TAN STRL LF (GAUZE/BANDAGES/DRESSINGS) ×3 IMPLANT
BNDG ESMARK 4X9 LF (GAUZE/BANDAGES/DRESSINGS) IMPLANT
BNDG ESMARK 6X9 LF (GAUZE/BANDAGES/DRESSINGS) ×3
CANISTER SUCT 1200ML W/VALVE (MISCELLANEOUS) ×3 IMPLANT
CHLORAPREP W/TINT 26ML (MISCELLANEOUS) ×3 IMPLANT
COVER BACK TABLE 60X90IN (DRAPES) ×3 IMPLANT
CUFF TOURNIQUET SINGLE 34IN LL (TOURNIQUET CUFF) ×2 IMPLANT
DECANTER SPIKE VIAL GLASS SM (MISCELLANEOUS) IMPLANT
DRAPE EXTREMITY T 121X128X90 (DRAPE) ×3 IMPLANT
DRAPE OEC MINIVIEW 54X84 (DRAPES) ×3 IMPLANT
DRAPE U-SHAPE 47X51 STRL (DRAPES) ×3 IMPLANT
DRSG MEPITEL 4X7.2 (GAUZE/BANDAGES/DRESSINGS) ×3 IMPLANT
DRSG PAD ABDOMINAL 8X10 ST (GAUZE/BANDAGES/DRESSINGS) ×6 IMPLANT
ELECT REM PT RETURN 9FT ADLT (ELECTROSURGICAL) ×3
ELECTRODE REM PT RTRN 9FT ADLT (ELECTROSURGICAL) ×1 IMPLANT
GAUZE SPONGE 4X4 12PLY STRL (GAUZE/BANDAGES/DRESSINGS) ×3 IMPLANT
GLOVE BIO SURGEON STRL SZ8 (GLOVE) ×3 IMPLANT
GLOVE BIOGEL PI IND STRL 7.0 (GLOVE) IMPLANT
GLOVE BIOGEL PI IND STRL 8 (GLOVE) ×2 IMPLANT
GLOVE BIOGEL PI INDICATOR 7.0 (GLOVE) ×2
GLOVE BIOGEL PI INDICATOR 8 (GLOVE) ×6
GLOVE ECLIPSE 6.5 STRL STRAW (GLOVE) ×2 IMPLANT
GLOVE ECLIPSE 8.0 STRL XLNG CF (GLOVE) ×3 IMPLANT
GOWN STRL REUS W/ TWL LRG LVL3 (GOWN DISPOSABLE) ×1 IMPLANT
GOWN STRL REUS W/ TWL XL LVL3 (GOWN DISPOSABLE) ×2 IMPLANT
GOWN STRL REUS W/TWL LRG LVL3 (GOWN DISPOSABLE) ×3
GOWN STRL REUS W/TWL XL LVL3 (GOWN DISPOSABLE) ×6
K-WIRE ACE 1.6X6 (WIRE) ×6
KWIRE ACE 1.6X6 (WIRE) IMPLANT
NEEDLE HYPO 22GX1.5 SAFETY (NEEDLE) IMPLANT
NS IRRIG 1000ML POUR BTL (IV SOLUTION) ×3 IMPLANT
PACK BASIN DAY SURGERY FS (CUSTOM PROCEDURE TRAY) ×3 IMPLANT
PAD CAST 4YDX4 CTTN HI CHSV (CAST SUPPLIES) ×1 IMPLANT
PADDING CAST ABS 4INX4YD NS (CAST SUPPLIES)
PADDING CAST ABS COTTON 4X4 ST (CAST SUPPLIES) IMPLANT
PADDING CAST COTTON 4X4 STRL (CAST SUPPLIES) ×3
PADDING CAST COTTON 6X4 STRL (CAST SUPPLIES) ×3 IMPLANT
PENCIL BUTTON HOLSTER BLD 10FT (ELECTRODE) ×3 IMPLANT
PLATE ACE 100DEG 7HOLE (Plate) ×2 IMPLANT
SANITIZER HAND PURELL 535ML FO (MISCELLANEOUS) ×3 IMPLANT
SCREW ACE CAN 4.0 40M (Screw) ×4 IMPLANT
SCREW CORTICAL 3.5MM  16MM (Screw) ×2 IMPLANT
SCREW CORTICAL 3.5MM  20MM (Screw) ×2 IMPLANT
SCREW CORTICAL 3.5MM 14MM (Screw) ×4 IMPLANT
SCREW CORTICAL 3.5MM 16MM (Screw) IMPLANT
SCREW CORTICAL 3.5MM 18MM (Screw) ×4 IMPLANT
SCREW CORTICAL 3.5MM 20MM (Screw) IMPLANT
SCREW CORTICAL 3.5MM 24MM (Screw) ×2 IMPLANT
SHEET MEDIUM DRAPE 40X70 STRL (DRAPES) ×3 IMPLANT
SLEEVE SCD COMPRESS KNEE MED (MISCELLANEOUS) ×3 IMPLANT
SPLINT FAST PLASTER 5X30 (CAST SUPPLIES) ×40
SPLINT PLASTER CAST FAST 5X30 (CAST SUPPLIES) ×20 IMPLANT
SPONGE LAP 18X18 X RAY DECT (DISPOSABLE) ×3 IMPLANT
STOCKINETTE 6  STRL (DRAPES) ×2
STOCKINETTE 6 STRL (DRAPES) ×1 IMPLANT
SUCTION FRAZIER HANDLE 10FR (MISCELLANEOUS) ×2
SUCTION TUBE FRAZIER 10FR DISP (MISCELLANEOUS) ×1 IMPLANT
SUT ETHILON 3 0 PS 1 (SUTURE) ×5 IMPLANT
SUT FIBERWIRE #2 38 T-5 BLUE (SUTURE)
SUT MNCRL AB 3-0 PS2 18 (SUTURE) ×4 IMPLANT
SUT VIC AB 0 SH 27 (SUTURE) ×2 IMPLANT
SUT VIC AB 2-0 SH 27 (SUTURE) ×3
SUT VIC AB 2-0 SH 27XBRD (SUTURE) ×1 IMPLANT
SUTURE FIBERWR #2 38 T-5 BLUE (SUTURE) IMPLANT
SYR BULB 3OZ (MISCELLANEOUS) ×3 IMPLANT
SYR CONTROL 10ML LL (SYRINGE) IMPLANT
TOWEL OR 17X24 6PK STRL BLUE (TOWEL DISPOSABLE) ×6 IMPLANT
TUBE CONNECTING 20'X1/4 (TUBING) ×1
TUBE CONNECTING 20X1/4 (TUBING) ×2 IMPLANT
UNDERPAD 30X30 (UNDERPADS AND DIAPERS) ×3 IMPLANT

## 2017-07-10 NOTE — Op Note (Signed)
07/10/2017  11:13 AM  PATIENT:  Diane Miranda  40 y.o. female  PRE-OPERATIVE DIAGNOSIS:  Left ankle trimalleolar fracture   POST-OPERATIVE DIAGNOSIS:  Left ankle trimalleolar fracture   Procedure(s): 1.  Open treatment of left ankle trimalleolar fracture with internal fixation without fixation of the posterior lip 2.  Stress exam of the left ankle under fluoroscopy 3.  AP, mortise and lateral xrays of the left ankle  SURGEON:  Wylene Simmer, MD  ASSISTANT: Mechele Claude, PA-C  ANESTHESIA:   General, regional  EBL:  minimal   TOURNIQUET:   Total Tourniquet Time Documented: Thigh (Left) - 38 minutes Total: Thigh (Left) - 38 minutes  COMPLICATIONS:  None apparent  DISPOSITION:  Extubated, awake and stable to recovery.  PROCEDURE IN DETAIL:  After pre operative consent was obtained, and the correct operative site was identified, the patient was brought to the operating room and placed supine on the OR table.  Anesthesia was administered.  Pre-operative antibiotics were administered.  A surgical timeout was taken.  The left lower extremity was then prepped and draped in standard sterile fashion with tourniquet around the thigh. The extremity was exsanguinated and the tourniquet was inflated to 250 mmHg. A longitudinal incision was then made over the lateral malleolus. Sharp dissection was carried down through the skin and subtenon's tissue to the level of the fracture site. Fracture was then irrigated and hematoma was debrided. Fracture was reduced and held with a lobster claw and a pointed tenaculum. 3.5 mm fully threaded lag screw was inserted from anterior to posterior across the fracture site. It was noted to have excellent purchase and compressed the fracture site appropriately. A 7 hole one third tubular plate from the [employer] small frag set was selected. It was contoured to fit the lateral malleolus. It was secured distally with 3 unicortical screws and proximally with 3  bicortical screws.  AP and lateral radiographs confirmed appropriate reduction of the fibula fracture and appropriate position and length of the screws.  Attention was turned to the medial aspect of the ankle where a longitudinal incision was made over the medial malleolus. Sharp dissection was carried down through the skin and subcutaneous tissue to the fracture site. Periosteum was excised with a scalpel. The fracture was reduced and held with a pointed tenaculum. AP and lateral ray grafts confirmed appropriate reduction of the fracture. 2 K wires were inserted across the fracture site. 4 mm x 40 mm partially threaded cannulated screws were inserted. Both were noted of excellent purchase and compressed the fracture site appropriately.  AP, mortise and lateral radiographs of the ankle showed appropriate position and length of all hardware and appropriate reduction of the medial lateral and posterior malleolus fractures. Stress examination was then performed under live fluoroscopy. No widening of the ankle mortise or syndesmosis was noted. The graft both wounds were then irrigated copiously. Lateral wound was closed with Vicryl and nylon. The medial wound was closed with Monocryl and nylon. Sterile dressings were applied followed by well-padded short leg splint. Tourniquet was released after application of the dressings at 38 minutes. The patient was awakened from anesthesia and transported to the recovery room in stable condition.   FOLLOW UP PLAN: Nonweightbearing on the left lower extremity for the next 6 weeks. Follow-up in 2 weeks for suture removal and conversion to a short leg cast. Aspirin 81 mg by mouth twice a day for DVT prophylaxis.   RADIOGRAPHS: AP, mortise and lateral radiographs of the left ankle are obtained  intraoperatively. These show interval reduction and fixation of the medial and lateral malleolus fractures. The posterior malleolus fracture fragment is small and appropriately  reduced. No other acute injuries are noted.    Mechele Claude PA-C was present and scrubbed for the duration of the operative case. His assistance assistance was essential in positioning the patient, prepping and draping, gaining maintaining exposure, performing the operation, closing and dressing the wounds and applying the splint.

## 2017-07-10 NOTE — Discharge Instructions (Addendum)
Wylene Simmer, MD [facility]  Please read the following information regarding your care after surgery.  Medications  You only need a prescription for the narcotic pain medicine (ex. oxycodone, Percocet, Norco).  All of the other medicines listed below are available over the counter. X hydrocodone as prescribed for moderate to severe pain X ketorolac as prescribed, with food, as needed for pain  Narcotic pain medicine (ex. oxycodone, Percocet, Vicodin) will cause constipation.  To prevent this problem, take the following medicines while you are taking any pain medicine. X docusate sodium (Colace) 100 mg twice a day X senna (Senokot) 2 tablets twice a day  X To help prevent blood clots, take a baby aspirin (81 mg) twice a day for six weeks after surgery.  You should also get up every hour while you are awake to move around.    Weight Bearing X Do not bear any weight on the operated leg or foot.  Cast / Splint / Dressing X Keep your splint, cast or dressing clean and dry.  Dont put anything (coat hanger, pencil, etc) down inside of it.  If it gets damp, use a hair dryer on the cool setting to dry it.  If it gets soaked, call the office to schedule an appointment for a cast change.  After your dressing, cast or splint is removed; you may shower, but do not soak or scrub the wound.  Allow the water to run over it, and then gently pat it dry.  Swelling It is normal for you to have swelling where you had surgery.  To reduce swelling and pain, keep your toes above your nose for at least 3 days after surgery.  It may be necessary to keep your foot or leg elevated for several weeks.  If it hurts, it should be elevated.  Follow Up Call my office at [phone] when you are discharged from the hospital or surgery center to schedule an appointment to be seen two weeks after surgery.  Call my office at [phone] if you develop a fever >101.5 F, nausea, vomiting, bleeding from the  surgical site or severe pain.     Post Anesthesia Home Care Instructions  Activity: Get plenty of rest for the remainder of the day. A responsible individual must stay with you for 24 hours following the procedure.  For the next 24 hours, DO NOT: -Drive a car -Paediatric nurse -Drink alcoholic beverages -Take any medication unless instructed by your physician -Make any legal decisions or sign important papers.  Meals: Start with liquid foods such as gelatin or soup. Progress to regular foods as tolerated. Avoid greasy, spicy, heavy foods. If nausea and/or vomiting occur, drink only clear liquids until the nausea and/or vomiting subsides. Call your physician if vomiting continues.  Special Instructions/Symptoms: Your throat may feel dry or sore from the anesthesia or the breathing tube placed in your throat during surgery. If this causes discomfort, gargle with warm salt water. The discomfort should disappear within 24 hours.  If you had a scopolamine patch placed behind your ear for the management of post- operative nausea and/or vomiting:  1. The medication in the patch is effective for 72 hours, after which it should be removed.  Wrap patch in a tissue and discard in the trash. Wash hands thoroughly with soap and water. 2. You may remove the patch earlier than 72 hours if you experience unpleasant side effects which may include dry mouth, dizziness or visual disturbances. 3. Avoid touching the patch. Wash  your hands with soap and water after contact with the patch.   Regional Anesthesia Blocks  1. Numbness or the inability to move the "blocked" extremity may last from 3-48 hours after placement. The length of time depends on the medication injected and your individual response to the medication. If the numbness is not going away after 48 hours, call your surgeon.  2. The extremity that is blocked will need to be protected until the numbness is gone and the  Strength has returned.  Because you cannot feel it, you will need to take extra care to avoid injury. Because it may be weak, you may have difficulty moving it or using it. You may not know what position it is in without looking at it while the block is in effect.  3. For blocks in the legs and feet, returning to weight bearing and walking needs to be done carefully. You will need to wait until the numbness is entirely gone and the strength has returned. You should be able to move your leg and foot normally before you try and bear weight or walk. You will need someone to be with you when you first try to ensure you do not fall and possibly risk injury.  4. Bruising and tenderness at the needle site are common side effects and will resolve in a few days.  5. Persistent numbness or new problems with movement should be communicated to the surgeon or the [facility] [phone] [place] [phone]).

## 2017-07-10 NOTE — Anesthesia Procedure Notes (Signed)
Procedure Name: LMA Insertion Date/Time: 07/10/2017 10:15 AM Performed by: Yariana Hoaglund D Pre-anesthesia Checklist: Patient identified, Emergency Drugs available, Suction available and Patient being monitored Patient Re-evaluated:Patient Re-evaluated prior to induction Oxygen Delivery Method: Circle system utilized Preoxygenation: Pre-oxygenation with 100% oxygen Induction Type: IV induction Ventilation: Mask ventilation without difficulty LMA: LMA inserted LMA Size: 3.0 Number of attempts: 1 Airway Equipment and Method: Bite block Placement Confirmation: positive ETCO2 Tube secured with: Tape Dental Injury: Teeth and Oropharynx as per pre-operative assessment

## 2017-07-10 NOTE — Anesthesia Postprocedure Evaluation (Signed)
Anesthesia Post Note  Patient: Diane Miranda  Procedure(s) Performed: Procedure(s) (LRB): OPEN REDUCTION INTERNAL FIXATION (ORIF) trimallolar ANKLE FRACTURE (Left)     Patient location during evaluation: PACU Anesthesia Type: General Level of consciousness: awake and alert Pain management: pain level controlled Vital Signs Assessment: post-procedure vital signs reviewed and stable Respiratory status: spontaneous breathing, nonlabored ventilation, respiratory function stable and patient connected to nasal cannula oxygen Cardiovascular status: blood pressure returned to baseline and stable Postop Assessment: no signs of nausea or vomiting Anesthetic complications: no    Last Vitals:  Vitals:   07/10/17 1145 07/10/17 1204  BP: 120/73 (!) 159/82  Pulse: (!) 118 (!) 120  Resp: 17 16  Temp:  36.8 C    Last Pain:  Vitals:   07/10/17 1204  TempSrc:   PainSc: 0-No pain                 Catalina Gravel

## 2017-07-10 NOTE — H&P (Signed)
Diane Miranda is an 41 y.o. female.   Chief Complaint:  Left ankle pain HPI:  41 y/o female with left ankle trimalleolar fracture after an injury sustained this past weekend.  She presents today for operative treatment of the unstable and displaced ankle fracture.  Past Medical History:  Diagnosis Date  . Asthma    as child  . Headache   . Hypertension   . Trimalleolar fracture of ankle, closed, left, initial encounter     Past Surgical History:  Procedure Laterality Date  . NO PAST SURGERIES      History reviewed. No pertinent family history. Social History:  reports that she has never smoked. She has never used smokeless tobacco. She reports that she drinks alcohol. She reports that she does not use drugs.  Allergies: No Known Allergies  Medications Prior to Admission  Medication Sig Dispense Refill  . hydrochlorothiazide (MICROZIDE) 12.5 MG capsule Take 12.5 mg by mouth daily.    Marland Kitchen levonorgestrel (MIRENA) 20 MCG/24HR IUD 1 each by Intrauterine route once.    Marland Kitchen oxyCODONE-acetaminophen (PERCOCET/ROXICET) 5-325 MG tablet Take 1 tablet by mouth every 4 (four) hours as needed for severe pain. 15 tablet 0    Results for orders placed or performed during the hospital encounter of 07/08/17 (from the past 48 hour(s))  I-STAT, chem 8     Status: Abnormal   Collection Time: 07/08/17  1:44 PM  Result Value Ref Range   Sodium 139 135 - 145 mmol/L   Potassium 3.5 3.5 - 5.1 mmol/L   Chloride 104 101 - 111 mmol/L   BUN 3 (L) 6 - 20 mg/dL   Creatinine, Ser 0.70 0.44 - 1.00 mg/dL   Glucose, Bld 109 (H) 65 - 99 mg/dL   Calcium, Ion 1.15 1.15 - 1.40 mmol/L   TCO2 25 0 - 100 mmol/L   Hemoglobin 13.6 12.0 - 15.0 g/dL   HCT 40.0 36.0 - 46.0 %   No results found.  ROS  No recent f/c/n/v/wt loss  Blood pressure 126/80, pulse (!) 105, temperature 99.4 F (37.4 C), temperature source Oral, resp. rate 12, height 5\' 4"  (1.626 m), weight 102.1 kg (225 lb), last menstrual period  07/02/2017, SpO2 100 %. Physical Exam  wn wd woman in nad.  A and o x 4.  Mood and affect normal.  EOMI.  resp unlabored.  L ankle with healthy skin.  No lymphadenopathy.  5/5 strength in PF and DF of the toes.  sens to LT intact at the dorsal and plantar forefoot.  Assessment/Plan L ankle trimal fracture - to OR for ORIF.  The risks and benefits of the alternative treatment options have been discussed in detail.  The patient wishes to proceed with surgery and specifically understands risks of bleeding, infection, nerve damage, blood clots, need for additional surgery, amputation and death.   Wylene Simmer, MD 2017/07/27, 9:44 AM

## 2017-07-10 NOTE — Transfer of Care (Signed)
Immediate Anesthesia Transfer of Care Note  Patient: Diane Miranda  Procedure(s) Performed: Procedure(s): OPEN REDUCTION INTERNAL FIXATION (ORIF) trimallolar ANKLE FRACTURE (Left)  Patient Location: PACU  Anesthesia Type:GA combined with regional for post-op pain  Level of Consciousness: awake, alert , oriented and patient cooperative  Airway & Oxygen Therapy: Patient Spontanous Breathing and Patient connected to face mask oxygen  Post-op Assessment: Report given to RN and Post -op Vital signs reviewed and stable  Post vital signs: Reviewed and stable  Last Vitals:  Vitals:   07/10/17 0952 07/10/17 1117  BP: 124/82 124/76  Pulse:  (!) 131  Resp:  12  Temp:      Last Pain:  Vitals:   07/10/17 0906  TempSrc: Oral         Complications: No apparent anesthesia complications

## 2017-07-10 NOTE — Anesthesia Preprocedure Evaluation (Signed)
Anesthesia Evaluation  Patient identified by MRN, date of birth, ID band Patient awake    Reviewed: Allergy & Precautions, NPO status , Patient's Chart, lab work & pertinent test results  Airway Mallampati: II  TM Distance: >3 FB Neck ROM: Full    Dental  (+) Teeth Intact, Dental Advisory Given   Pulmonary asthma ,    Pulmonary exam normal breath sounds clear to auscultation       Cardiovascular hypertension, Pt. on medications Normal cardiovascular exam Rhythm:Regular Rate:Normal     Neuro/Psych  Headaches,    GI/Hepatic negative GI ROS, Neg liver ROS,   Endo/Other  Obesity   Renal/GU negative Renal ROS     Musculoskeletal negative musculoskeletal ROS (+)   Abdominal   Peds  Hematology negative hematology ROS (+)   Anesthesia Other Findings Day of surgery medications reviewed with the patient.  Reproductive/Obstetrics negative OB ROS                             Anesthesia Physical Anesthesia Plan  ASA: II  Anesthesia Plan: General   Post-op Pain Management:  Regional for Post-op pain   Induction: Intravenous  PONV Risk Score and Plan: 3 and Ondansetron, Dexamethasone, Propofol and Midazolam  Airway Management Planned: LMA  Additional Equipment:   Intra-op Plan:   Post-operative Plan: Extubation in OR  Informed Consent: I have reviewed the patients History and Physical, chart, labs and discussed the procedure including the risks, benefits and alternatives for the proposed anesthesia with the patient or authorized representative who has indicated his/her understanding and acceptance.   Dental advisory given  Plan Discussed with: CRNA  Anesthesia Plan Comments: (Risks/benefits of general anesthesia discussed with patient including risk of damage to teeth, lips, gum, and tongue, nausea/vomiting, allergic reactions to medications, and the possibility of heart attack, stroke  and death.  All patient questions answered.  Patient wishes to proceed.)       Anesthesia Quick Evaluation

## 2017-07-10 NOTE — Progress Notes (Signed)
Assisted Dr. Turk with left, ultrasound guided, popliteal/saphenous block. Side rails up, monitors on throughout procedure. See vital signs in flow sheet. Tolerated Procedure well. 

## 2017-07-10 NOTE — Anesthesia Procedure Notes (Addendum)
Anesthesia Regional Block: Popliteal block   Pre-Anesthetic Checklist: ,, timeout performed, Correct Patient, Correct Site, Correct Laterality, Correct Procedure, Correct Position, site marked, Risks and benefits discussed,  Surgical consent,  Pre-op evaluation,  At surgeon's request and post-op pain management  Laterality: Left  Prep: chloraprep       Needles:  Injection technique: Single-shot  Needle Type: Echogenic Needle     Needle Length: 9cm  Needle Gauge: 21     Additional Needles:   Procedures: ultrasound guided,,,,,,,,  Narrative:  Start time: 07/10/2017 9:45 AM End time: 07/10/2017 9:50 AM Injection made incrementally with aspirations every 5 mL.  Performed by: Personally  Anesthesiologist: Catalina Gravel  Additional Notes: No pain on injection. No increased resistance to injection. Injection made in 5cc increments.  Good needle visualization.  Patient tolerated procedure well.  Combined left popliteal/saphenous nerve block.

## 2017-07-11 ENCOUNTER — Encounter (HOSPITAL_BASED_OUTPATIENT_CLINIC_OR_DEPARTMENT_OTHER): Payer: Self-pay | Admitting: Orthopedic Surgery

## 2019-11-09 ENCOUNTER — Other Ambulatory Visit: Payer: Self-pay

## 2019-11-09 DIAGNOSIS — Z20822 Contact with and (suspected) exposure to covid-19: Secondary | ICD-10-CM

## 2019-11-11 LAB — NOVEL CORONAVIRUS, NAA: SARS-CoV-2, NAA: NOT DETECTED

## 2019-11-23 ENCOUNTER — Other Ambulatory Visit: Payer: Self-pay

## 2019-11-23 DIAGNOSIS — Z20822 Contact with and (suspected) exposure to covid-19: Secondary | ICD-10-CM

## 2019-11-25 LAB — NOVEL CORONAVIRUS, NAA: SARS-CoV-2, NAA: NOT DETECTED

## 2021-01-16 DIAGNOSIS — I10 Essential (primary) hypertension: Secondary | ICD-10-CM | POA: Insufficient documentation

## 2021-02-07 ENCOUNTER — Other Ambulatory Visit: Payer: Self-pay | Admitting: Nurse Practitioner

## 2021-02-07 ENCOUNTER — Other Ambulatory Visit: Payer: Self-pay | Admitting: *Deleted

## 2021-02-07 DIAGNOSIS — R5381 Other malaise: Secondary | ICD-10-CM

## 2021-02-09 ENCOUNTER — Other Ambulatory Visit: Payer: Self-pay | Admitting: Nurse Practitioner

## 2021-02-09 DIAGNOSIS — N644 Mastodynia: Secondary | ICD-10-CM

## 2021-02-15 ENCOUNTER — Ambulatory Visit
Admission: RE | Admit: 2021-02-15 | Discharge: 2021-02-15 | Disposition: A | Discharge status: Home / Self Care | Payer: No Typology Code available for payment source | Source: Ambulatory Visit | Attending: *Deleted | Admitting: *Deleted

## 2021-02-15 ENCOUNTER — Other Ambulatory Visit: Payer: Self-pay | Admitting: Nurse Practitioner

## 2021-02-15 ENCOUNTER — Other Ambulatory Visit: Payer: Self-pay

## 2021-02-15 DIAGNOSIS — N644 Mastodynia: Secondary | ICD-10-CM

## 2021-02-15 DIAGNOSIS — N6310 Unspecified lump in the right breast, unspecified quadrant: Secondary | ICD-10-CM

## 2021-02-15 DIAGNOSIS — R599 Enlarged lymph nodes, unspecified: Secondary | ICD-10-CM

## 2021-02-23 ENCOUNTER — Ambulatory Visit
Admission: RE | Admit: 2021-02-23 | Discharge: 2021-02-23 | Disposition: A | Discharge status: Home / Self Care | Payer: No Typology Code available for payment source | Source: Ambulatory Visit | Attending: *Deleted | Admitting: *Deleted

## 2021-02-23 ENCOUNTER — Other Ambulatory Visit: Payer: Self-pay

## 2021-02-23 DIAGNOSIS — N6310 Unspecified lump in the right breast, unspecified quadrant: Secondary | ICD-10-CM

## 2021-02-23 DIAGNOSIS — N644 Mastodynia: Secondary | ICD-10-CM

## 2021-02-23 DIAGNOSIS — R599 Enlarged lymph nodes, unspecified: Secondary | ICD-10-CM

## 2021-02-28 ENCOUNTER — Telehealth: Payer: Self-pay | Admitting: Oncology

## 2021-02-28 NOTE — Telephone Encounter (Signed)
Spoke to patient to confirm afternoon [facility] appointment for 3/23, packet emailed to patient

## 2021-03-01 ENCOUNTER — Other Ambulatory Visit: Payer: Self-pay | Admitting: Nurse Practitioner

## 2021-03-01 ENCOUNTER — Encounter: Payer: Self-pay | Admitting: *Deleted

## 2021-03-01 ENCOUNTER — Other Ambulatory Visit: Payer: Self-pay | Admitting: Anesthesiology

## 2021-03-01 DIAGNOSIS — C50911 Malignant neoplasm of unspecified site of right female breast: Secondary | ICD-10-CM

## 2021-03-01 DIAGNOSIS — C50411 Malignant neoplasm of upper-outer quadrant of right female breast: Secondary | ICD-10-CM

## 2021-03-04 ENCOUNTER — Ambulatory Visit
Admission: RE | Admit: 2021-03-04 | Discharge: 2021-03-04 | Disposition: A | Discharge status: Home / Self Care | Payer: No Typology Code available for payment source | Source: Ambulatory Visit | Attending: *Deleted | Admitting: *Deleted

## 2021-03-04 ENCOUNTER — Other Ambulatory Visit: Payer: Self-pay

## 2021-03-04 DIAGNOSIS — C50911 Malignant neoplasm of unspecified site of right female breast: Secondary | ICD-10-CM

## 2021-03-04 MED ORDER — GADOBUTROL 1 MMOL/ML IV SOLN
10.0000 mL | Freq: Once | INTRAVENOUS | Status: AC | PRN
  Administered 2021-03-04: 10 mL via INTRAVENOUS

## 2021-03-06 NOTE — Progress Notes (Signed)
[facility]  [phone] [phone]     ID: Diane Miranda DOB: [birth year]  MR#: [id number]  LYY#:[id number]  Patient Care Team: Diane Docker, MD as PCP - General (Internal Medicine) Diane Kaufmann, RN as Oncology Nurse Navigator Diane Germany, RN as Oncology Nurse Navigator Diane Klein, MD as Consulting Physician (General Surgery) Diane Miranda, Diane Dad, MD as Consulting Physician (Oncology) Diane Pray, MD as Consulting Physician (Radiation Oncology) Diane Simmer, MD as Consulting Physician (Orthopedic Surgery) Diane Cruel, MD OTHER MD:  CHIEF COMPLAINT: triple positive breast cancer  CURRENT TREATMENT: Neoadjuvant chemotherapy   HISTORY OF CURRENT ILLNESS: Diane Miranda herself palpated a right breast mass and noted associated pain. She underwent bilateral diagnostic mammography with tomography and right breast ultrasonography at [facility] on 02/15/2021 showing: breast density category B; palpable 5.4 cm mass within outer right breast; two abnormal right axillary lymph nodes; 1.8 cm indeterminate hypoechoic area within upper-outer right breast; no evidence of left breast malignancy.  Accordingly on 02/23/2021 she proceeded to biopsy of the right breast areas in question. The pathology from this procedure ([id number]) showed:  1. Right Breast, 11 o'clock  - adenosis and columnar cell hyperplasia; this was concordant 2. Right Breast, 9 o'clock  - invasive ductal carcinoma, grade 3  - Prognostic indicators significant for: estrogen receptor, 40% positive with moderate staining intensity and progesterone receptor, 60% positive with strong staining intensity. Proliferation marker Ki67 at 35%. HER2 positive by immunohistochemistry (3+). 3. Right Axilla, lymph node  - invasive ductal carcinoma involving nodal tissue  She underwent breast MRI on 03/04/2021 showing: breast composition B; 4.7 cm mass in 8-9 o'clock position  of posterior right breast represents recently diagnosed malignancy; two adjacent abnormal right axillary lymph nodes, one of which represents the biopsy-proven metastatic node; significant skin thickening in periareolar right breast, with significant increased T2 signal consistent with edema; mild abnormal enhancement within thickened skin, nonspecific.  There was also a 1.1 cm nonspecific enhancing mass in left breast at 2-3 o'clock.  Cancer Staging Malignant neoplasm of upper-outer quadrant of right breast in female, estrogen receptor positive ([place]) Staging form: Breast, AJCC 8th Edition - Clinical: Stage IIB (cT3, cN1, cM0, G3, ER+, PR+, HER2+) - Signed by Diane Cruel, MD on 03/07/2021 Histologic grading system: 3 grade system   The patient's subsequent history is as detailed below.   INTERVAL HISTORY: Diane Miranda was evaluated in the multidisciplinary breast cancer clinic on 03/07/2021 accompanied by her best friend Diane Miranda. Her case was also presented at the multidisciplinary breast cancer conference on the same day. At that time a preliminary plan was proposed: Neoadjuvant chemotherapy, definitive surgery, biopsy of the left breast mass, adjuvant radiation, antiestrogens, anti-HER-2 treatment   REVIEW OF SYSTEMS: On the provided questionnaire, Diane Miranda reports fatigue that does not affect her activities, joint pain, sinus problems, tachycardia, heartburn, palpable breast lump, anxiety, and swollen lymph glands. The patient denies unusual headaches, visual changes, nausea, vomiting, stiff neck, dizziness, or gait imbalance. There has been no cough, phlegm production, or pleurisy, no chest pain or pressure, and no change in bowel or bladder habits. The patient denies fever, rash, bleeding, unexplained fatigue or unexplained weight loss. A detailed review of systems was otherwise entirely negative.   COVID 19 VACCINATION STATUS: refuses vaccination; infection 10/2019   PAST MEDICAL  HISTORY: Past Medical History:  Diagnosis Date  . Allergy 2000  . Asthma    as child  . Headache   . Hypertension   .  Prediabetes   . Trimalleolar fracture of ankle, closed, left, initial encounter     PAST SURGICAL HISTORY: Past Surgical History:  Procedure Laterality Date  . FRACTURE SURGERY  2018  . NO PAST SURGERIES    . ORIF ANKLE FRACTURE Left 07/10/2017   Procedure: OPEN REDUCTION INTERNAL FIXATION (ORIF) trimallolar ANKLE FRACTURE;  Surgeon: Diane Simmer, MD;  Location: [facility];  Service: Orthopedics;  Laterality: Left;    FAMILY HISTORY: Family History  Problem Relation Age of Onset  . Diabetes Father   . Hypertension Father   . Early death Mother   . Heart disease Mother   . Hypertension Mother   . Hearing loss Maternal Grandmother   . Hypertension Maternal Grandmother   . Lung cancer Paternal Aunt    Her father is living at age 5 as of 03/26/2021. Her mother died at age 52 from [place]. Diane Miranda has two brothers (and no sisters). She reports endometrial cancer in a maternal great aunt and lung cancer in a paternal aunt. There is no other family history of cancer to her knowledge.   GYNECOLOGIC HISTORY:  No LMP recorded. Menarche: 45 years old Age at first live birth: 45 years old [place] P 2 LMP early Mar 26, 2021 (has since had IUD placed) Contraceptive: IUD in place Diane Miranda), previously used pills from 1996-2006 HRT n/a  Hysterectomy? no BSO? 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 She is single. She lives at home with daughter Diane Miranda (age 33), adopted daughter Diane Miranda (age 14), and the patient's maternal grandmother, who has significant dementia issues. Daughter Diane Miranda, age 24, works in Press photographer in [place]. 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    ADVANCED DIRECTIVES: not in place; however the patient made it clear during her 03/07/2021 visit that she would not accept  blood product transfusions for any reason including immediately life saving reasons  HEALTH MAINTENANCE: Social History   Tobacco Use  . Smoking status: Never Smoker  . Smokeless tobacco: Never Used  Vaping Use  . Vaping Use: Never used  Substance Use Topics  . Alcohol use: Yes    Alcohol/week: 0.0 standard drinks    Comment: social  . Drug use: No     Colonoscopy: n/a (age)  PAP: 2020  Bone density: n/a (age)   No Known Allergies  Current Outpatient Medications  Medication Sig Dispense Refill  . aspirin EC 81 MG tablet Take 1 tablet (81 mg total) by mouth 2 (two) times daily. 84 tablet 0  . docusate sodium (COLACE) 100 MG capsule Take 1 capsule (100 mg total) by mouth 2 (two) times daily. While taking narcotic pain medicine. 30 capsule 0  . hydrochlorothiazide (MICROZIDE) 12.5 MG capsule Take 12.5 mg by mouth daily.    Marland Kitchen HYDROcodone-acetaminophen (NORCO/VICODIN) 5-325 MG tablet Take 1-2 tablets by mouth every 4 (four) hours as needed for moderate pain. For no more than 3 days. 30 tablet 0  . ketorolac (TORADOL) 10 MG tablet Take 1 tablet (10 mg total) by mouth every 6 (six) hours as needed. 30 tablet 0  . levonorgestrel (MIRENA) 20 MCG/24HR IUD 1 each by Intrauterine route once.    . senna (SENOKOT) 8.6 MG TABS tablet Take 2 tablets (17.2 mg total) by mouth 2 (two) times daily. 30 each 0   No current facility-administered medications for this visit.    OBJECTIVE: African-American woman who appears stated age  12:   03/07/21 1252  BP: (!) 153/90  Pulse: (!) 107  Resp: 18  Temp: 98.8 F (37.1 C)  SpO2: 100%     Body mass index is 41.09 kg/m.   Wt Readings from Last 3 Encounters:  03/07/21 239 lb 6.4 oz (108.6 kg)  07/10/17 225 lb (102.1 kg)  07/04/17 225 lb (102.1 kg)      ECOG FS:1 - Symptomatic but completely ambulatory  Ocular: Sclerae unicteric, pupils round and equal Ear-nose-throat: Wearing a mask Lymphatic: No cervical or supraclavicular  adenopathy Lungs no rales or rhonchi Heart regular rate and rhythm Abd soft, nontender, positive bowel sounds MSK no focal spinal tenderness, no joint edema Neuro: non-focal, well-oriented, appropriate affect Breasts: The right breast is status post biopsies.  There is no significant ecchymosis.  The mass in the outer quadrant of the right breast is not well-defined but seems to measure approximately 5 cm by palpation.  I do not palpate axillary adenopathy bilaterally   LAB RESULTS:  CMP     Component Value Date/Time   NA 142 03/07/2021 1219   K 3.6 03/07/2021 1219   CL 106 03/07/2021 1219   CO2 26 03/07/2021 1219   GLUCOSE 122 (H) 03/07/2021 1219   GLUCOSE 105 (H) 11/12/2006 1022   BUN 8 03/07/2021 1219   CREATININE 0.92 03/07/2021 1219   CALCIUM 8.7 (L) 03/07/2021 1219   PROT 7.0 03/07/2021 1219   ALBUMIN 3.8 03/07/2021 1219   AST 13 (L) 03/07/2021 1219   ALT 16 03/07/2021 1219   ALKPHOS 63 03/07/2021 1219   BILITOT 0.6 03/07/2021 1219   GFRNONAA >60 03/07/2021 1219   GFRAA >90 01/30/2015 1319    No results found for: TOTALPROTELP, ALBUMINELP, A1GS, A2GS, BETS, BETA2SER, GAMS, MSPIKE, SPEI  Lab Results  Component Value Date   WBC 11.9 (H) 03/07/2021   NEUTROABS 9.0 (H) 03/07/2021   HGB 13.4 03/07/2021   HCT 39.8 03/07/2021   MCV 88.8 03/07/2021   PLT 330 03/07/2021    No results found for: LABCA2  No components found for: IRCVEL381  No results for input(s): INR in the last 168 hours.  No results found for: LABCA2  No results found for: OFB510  No results found for: CHE527  No results found for: POE423  No results found for: CA2729  No components found for: HGQUANT  No results found for: CEA1 / No results found for: CEA1   No results found for: AFPTUMOR  No results found for: CHROMOGRNA  No results found for: KPAFRELGTCHN, LAMBDASER, KAPLAMBRATIO (kappa/lambda light chains)  No results found for: HGBA, HGBA2QUANT, HGBFQUANT,  HGBSQUAN (Hemoglobinopathy evaluation)   No results found for: LDH  No results found for: IRON, TIBC, IRONPCTSAT (Iron and TIBC)  No results found for: FERRITIN  Urinalysis    Component Value Date/Time   COLORURINE YELLOW 01/30/2015 1343   APPEARANCEUR CLEAR 01/30/2015 1343   LABSPEC 1.011 01/30/2015 1343   PHURINE 7.5 01/30/2015 1343   GLUCOSEU NEGATIVE 01/30/2015 1343   HGBUR NEGATIVE 01/30/2015 1343   BILIRUBINUR NEGATIVE 01/30/2015 1343   KETONESUR NEGATIVE 01/30/2015 1343   PROTEINUR NEGATIVE 01/30/2015 1343   UROBILINOGEN 0.2 01/30/2015 1343   NITRITE NEGATIVE 01/30/2015 1343   LEUKOCYTESUR SMALL (A) 01/30/2015 1343     STUDIES: MR BREAST BILATERAL W WO CONTRAST INC CAD  Result Date: 03/05/2021 CLINICAL DATA:  Recent diagnosis invasive ductal carcinoma of the right breast at 9 o'clock as well as metastatic right axillary lymphadenopathy. Patient also underwent a biopsy of an abnormality in the anterior  right breast at 11 o'clock, which revealed adenosis and columnar cell hyperplasia without malignancy or atypia. Current exam is to assess extent of disease. LABS:  No labs drawn at time of imaging. EXAM: BILATERAL BREAST MRI WITH AND WITHOUT CONTRAST TECHNIQUE: Multiplanar, multisequence MR images of both breasts were obtained prior to and following the intravenous administration of 10 ml of Gadavist Three-dimensional MR images were rendered by post-processing of the original MR data on an independent workstation. The three-dimensional MR images were interpreted, and findings are reported in the following complete MRI report for this study. Three dimensional images were evaluated at the independent interpreting workstation using the DynaCAD thin client. COMPARISON:  Previous exam(s). FINDINGS: Breast composition: b. Scattered fibroglandular tissue. Background parenchymal enhancement: Marked Right breast: There is an enhancing mass in the right breast laterally measuring 4.7 x 4.2 x  4.4 cm. The mass lies posteriorly, projecting at 8-9 o'clock. Post biopsy clip artifact lies along the mass's superior margin. There is non mass-like enhancement along the lateral margin, which likely reflects post biopsy change. There are no other masses. Post biopsy clip artifact is also noted in the anterior, upper outer right breast from the recent benign biopsy. There is significant periareolar skin thickening, which shows increased T2 signal consistent with edema, but also demonstrates mild enhancement. Left breast: There is a small enhancing mass in the upper outer quadrant between 2 and 3 o'clock, middle depth, measuring 1.1 x 0.5 x 0.8 cm. This mass shows some washout kinetics, but is predominantly plateau type kinetics. There are no other left breast masses or areas of abnormal enhancement. Lymph nodes: 2 adjacent enlarged lymph nodes are noted in the right axilla with maximal cortical thickness is measured at 18 and 12 mm. No other enlarged or abnormal appearing lymph nodes on the right. No abnormal lymph nodes on the left. Ancillary findings:  None. IMPRESSION: 1. 4.7 x 4.2 x 4.4 cm mass in the 8-9 o'clock position of the posterior right breast represents the recently diagnosed malignancy. 2. Two adjacent abnormal right axillary lymph nodes, 1 of which represents the biopsy-proven metastatic node, the other is also consistent with metastatic lymphadenopathy. 3. Significant skin thickening in the periareolar right breast, with significant increased T2 signal consistent with edema. There is also mild abnormal enhancement within the thickened skin, which is nonspecific. Consider skin punch biopsy. 4. 1.1 cm, nonspecific enhancing mass in the 2 to 3 o'clock position of the left breast. RECOMMENDATION: 1. Recommend MRI guided biopsy of the 1.1 cm enhancing mass in the lateral left at 2-3 o'clock. BI-RADS CATEGORY  4: Suspicious. Electronically Signed   By: Lajean Manes M.D.   On: 03/05/2021 08:55   US  BREAST LTD UNI RIGHT INC AXILLA  Result Date: 02/15/2021 CLINICAL DATA:  45 year old female with palpable RIGHT breast mass and pain. Baseline examination. EXAM: DIGITAL DIAGNOSTIC BILATERAL MAMMOGRAM WITH TOMOSYNTHESIS AND CAD; ULTRASOUND RIGHT BREAST LIMITED TECHNIQUE: Bilateral digital diagnostic mammography and breast tomosynthesis was performed. The images were evaluated with computer-aided detection.; Targeted ultrasound examination of the right breast was performed COMPARISON:  None. ACR Breast Density Category b: There are scattered areas of fibroglandular density. FINDINGS: 2D/3D full field views of both breasts demonstrate a large indistinct mass within the posterior OUTER RIGHT breast. An asymmetry in the RETROAREOLAR RIGHT breast is noted. At least 2 lymph nodes with cortical thickening are identified within the RIGHT axilla. No suspicious mammographic findings within the LEFT breast are noted. On physical exam, a large firm palpable mass  identified at the 9 o'clock position of the RIGHT breast 8 cm from the nipple. Targeted ultrasound of the RIGHT breast is performed, showing the following: A 3.7 x 3.2 x 5.4 cm irregular hypoechoic mass at the 9 o'clock position 8 cm from the nipple. A 0.8 x 0.7 x 1.8 cm irregular hypoechoic area at the 11 o'clock position 3 cm from the nipple. Two abnormal RIGHT axillary lymph nodes with cortical thickening of up to 0.9 cm. IMPRESSION: 1. Highly suspicious 5.4 cm mass within the OUTER RIGHT breast and 2 abnormal RIGHT axillary lymph nodes. Tissue sampling of this mass and 1 of the RIGHT axillary lymph nodes is recommended. 2. 1.8 cm indeterminate hypoechoic area within the UPPER-OUTER RIGHT breast. Tissue sampling is recommended. 3. No mammographic evidence of LEFT breast malignancy. RECOMMENDATION: Ultrasound-guided biopsies of the 5.4 cm OUTER RIGHT breast mass, the 1.8 cm UPPER-OUTER RIGHT breast hypoechoic area and 1 of the enlarged RIGHT axillary lymph nodes.  These procedures will be scheduled. I have discussed the findings and recommendations with the patient. If applicable, a reminder letter will be sent to the patient regarding the next appointment. BI-RADS CATEGORY  5: Highly suggestive of malignancy. Electronically Signed   By: Margarette Canada M.D.   On: 02/15/2021 15:54   MM DIAG BREAST TOMO BILATERAL  Result Date: 02/15/2021 CLINICAL DATA:  45 year old female with palpable RIGHT breast mass and pain. Baseline examination. EXAM: DIGITAL DIAGNOSTIC BILATERAL MAMMOGRAM WITH TOMOSYNTHESIS AND CAD; ULTRASOUND RIGHT BREAST LIMITED TECHNIQUE: Bilateral digital diagnostic mammography and breast tomosynthesis was performed. The images were evaluated with computer-aided detection.; Targeted ultrasound examination of the right breast was performed COMPARISON:  None. ACR Breast Density Category b: There are scattered areas of fibroglandular density. FINDINGS: 2D/3D full field views of both breasts demonstrate a large indistinct mass within the posterior OUTER RIGHT breast. An asymmetry in the RETROAREOLAR RIGHT breast is noted. At least 2 lymph nodes with cortical thickening are identified within the RIGHT axilla. No suspicious mammographic findings within the LEFT breast are noted. On physical exam, a large firm palpable mass identified at the 9 o'clock position of the RIGHT breast 8 cm from the nipple. Targeted ultrasound of the RIGHT breast is performed, showing the following: A 3.7 x 3.2 x 5.4 cm irregular hypoechoic mass at the 9 o'clock position 8 cm from the nipple. A 0.8 x 0.7 x 1.8 cm irregular hypoechoic area at the 11 o'clock position 3 cm from the nipple. Two abnormal RIGHT axillary lymph nodes with cortical thickening of up to 0.9 cm. IMPRESSION: 1. Highly suspicious 5.4 cm mass within the OUTER RIGHT breast and 2 abnormal RIGHT axillary lymph nodes. Tissue sampling of this mass and 1 of the RIGHT axillary lymph nodes is recommended. 2. 1.8 cm indeterminate  hypoechoic area within the UPPER-OUTER RIGHT breast. Tissue sampling is recommended. 3. No mammographic evidence of LEFT breast malignancy. RECOMMENDATION: Ultrasound-guided biopsies of the 5.4 cm OUTER RIGHT breast mass, the 1.8 cm UPPER-OUTER RIGHT breast hypoechoic area and 1 of the enlarged RIGHT axillary lymph nodes. These procedures will be scheduled. I have discussed the findings and recommendations with the patient. If applicable, a reminder letter will be sent to the patient regarding the next appointment. BI-RADS CATEGORY  5: Highly suggestive of malignancy. Electronically Signed   By: Margarette Canada M.D.   On: 02/15/2021 15:54   [place] AXILLARY NODE CORE BIOPSY RIGHT  Addendum Date: 02/26/2021   ADDENDUM REPORT: 02/26/2021 15:04 ADDENDUM: Pathology revealed [place]  HYPERPLASIA of the Right breast, 11:00 o'clock. This was found to be concordant by Dr. Franki Cabot. 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 This was found to be concordant by Dr. Franki Cabot. Pathology revealed INVASIVE DUCTAL CARCINOMA INVOLVING NODAL TISSUE of the Right axilla. This was found to be concordant by Dr. Franki Cabot. Pathology results were discussed with the patient by telephone. The patient reported doing well after the biopsies with tenderness at the sites. Post biopsy instructions and care were reviewed and questions were answered. The patient was encouraged to call [facility] for any additional concerns. My direct phone number was provided. The patient was referred to [facility] on March 07, 2021. Pathology results reported by Terie Purser, RN on 02/26/2021. Electronically Signed   By: Franki Cabot M.D.   On: 02/26/2021 15:04   Result Date: 02/26/2021 CLINICAL DATA:  Patient with suspicious masses within the RIGHT breast at the 11 o'clock and 9 o'clock  axes, presenting today for ultrasound-guided core biopsies. Patient also with suspicious enlarged/morphologically abnormal lymph nodes in the RIGHT axilla, presenting today for ultrasound-guided core biopsy of 1 of these abnormal-appearing lymph nodes. EXAM: ULTRASOUND GUIDED RIGHT BREAST CORE NEEDLE BIOPSY x2 ULTRASOUND-GUIDED RIGHT AXILLARY LYMPH NODE CORE NEEDLE BIOPSY COMPARISON:  Previous exam(s). PROCEDURE: I met with the patient and we discussed the procedure of ultrasound-guided biopsy, including benefits and alternatives. We discussed the high likelihood of a successful procedure. We discussed the risks of the procedure, including infection, bleeding, tissue injury, clip migration, and inadequate sampling. Informed written consent was given. The usual time-out protocol was performed immediately prior to the procedure. Site 1: Lesion quadrant: Upper outer quadrant Using sterile technique and 1% Lidocaine as local anesthetic, under direct ultrasound visualization, a 12 gauge spring-loaded device was used to perform biopsy of the hypoechoic mass in the RIGHT breast at the 11 o'clock axis using a medial approach. At the conclusion of the procedure coil shaped tissue marker clip was deployed into the biopsy cavity. Site 2: Lesion quadrant: Upper outer quadrant Using sterile technique and 1% Lidocaine as local anesthetic, under direct ultrasound visualization, a 12 gauge spring-loaded device was used to perform biopsy of the RIGHT breast mass at the 9 o'clock axis using a lateral approach. At the conclusion of the procedure heart shaped tissue marker clip was deployed into the biopsy cavity. Site 3: Lesion quadrant: RIGHT axilla Using sterile technique and 1% Lidocaine as local anesthetic, under direct ultrasound visualization, a 14 gauge spring-loaded device was used to perform biopsy of 1 of the enlarged/morphologically abnormal lymph nodes in the RIGHT axilla using a lateral approach. At the conclusion of the  procedure tribell tissue marker clip was deployed into the biopsy cavity. Follow up 2 view mammogram was performed and dictated separately. IMPRESSION: 1. Ultrasound guided biopsy of the RIGHT breast mass at the 11 o'clock axis. No apparent complications. 2. Ultrasound guided biopsy of the RIGHT breast mass at the 9 o'clock axis. No apparent complications. 3. Ultrasound guided biopsy of 1 of the enlarged lymph nodes in the RIGHT axilla. No apparent complications. Electronically Signed: By: Franki Cabot M.D. On: 02/23/2021 13:48   MM CLIP PLACEMENT RIGHT  Result Date: 02/23/2021 CLINICAL DATA:  Status post ultrasound-guided biopsies for suspicious masses in the RIGHT breast at the 9 o'clock and 11 o'clock axes. Also status post ultrasound-guided biopsy of an enlarged lymph node  in the RIGHT axilla. EXAM: DIAGNOSTIC RIGHT MAMMOGRAM POST ULTRASOUND BIOPSY x3 COMPARISON:  Previous exam(s). FINDINGS: Mammographic images were obtained following ultrasound guided biopsy of suspicious masses in the RIGHT breast at the 9 o'clock and 11 o'clock axes and ultrasound-guided biopsy of an enlarged lymph node in the RIGHT axilla. The biopsy marking clips are in expected positions at the sites of biopsy. IMPRESSION: 1. Appropriate positioning of the coil shaped biopsy marking clip at the site of biopsy in the upper outer quadrant, corresponding to the targeted mass at the 11 o'clock axis. 2. Appropriate positioning of the heart shaped biopsy marking clip at the site of biopsy in the upper outer quadrant, corresponding to the targeted mass at the 9 o'clock axis. 3. Appropriate positioning of the tribell shaped biopsy marking clip at the site of biopsy in the lower RIGHT axilla. Final Assessment: Post Procedure Mammograms for Marker Placement Electronically Signed   By: Franki Cabot M.D.   On: 02/23/2021 14:22   [place] RT BREAST BX W LOC DEV 1ST LESION IMG BX SPEC US GUIDE  Addendum Date: 02/26/2021   ADDENDUM REPORT: 02/26/2021  15:04 ADDENDUM: 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 This was found to be concordant by Dr. Franki Cabot. 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 This was found to be concordant by Dr. Franki Cabot. Pathology revealed INVASIVE DUCTAL CARCINOMA INVOLVING NODAL TISSUE of the Right axilla. This was found to be concordant by Dr. Franki Cabot. Pathology results were discussed with the patient by telephone. The patient reported doing well after the biopsies with tenderness at the sites. Post biopsy instructions and care were reviewed and questions were answered. The patient was encouraged to call [facility] for any additional concerns. My direct phone number was provided. The patient was referred to [facility] on March 07, 2021. Pathology results reported by Terie Purser, RN on 02/26/2021. Electronically Signed   By: Franki Cabot M.D.   On: 02/26/2021 15:04   Result Date: 02/26/2021 CLINICAL DATA:  Patient with suspicious masses within the RIGHT breast at the 11 o'clock and 9 o'clock axes, presenting today for ultrasound-guided core biopsies. Patient also with suspicious enlarged/morphologically abnormal lymph nodes in the RIGHT axilla, presenting today for ultrasound-guided core biopsy of 1 of these abnormal-appearing lymph nodes. EXAM: ULTRASOUND GUIDED RIGHT BREAST CORE NEEDLE BIOPSY x2 ULTRASOUND-GUIDED RIGHT AXILLARY LYMPH NODE CORE NEEDLE BIOPSY COMPARISON:  Previous exam(s). PROCEDURE: I met with the patient and we discussed the procedure of ultrasound-guided biopsy, including benefits and alternatives. We discussed the high likelihood of a successful procedure. We discussed the risks of the procedure, including infection, bleeding, tissue injury, clip migration, and inadequate sampling. Informed written  consent was given. The usual time-out protocol was performed immediately prior to the procedure. Site 1: Lesion quadrant: Upper outer quadrant Using sterile technique and 1% Lidocaine as local anesthetic, under direct ultrasound visualization, a 12 gauge spring-loaded device was used to perform biopsy of the hypoechoic mass in the RIGHT breast at the 11 o'clock axis using a medial approach. At the conclusion of the procedure coil shaped tissue marker clip was deployed into the biopsy cavity. Site 2: Lesion quadrant: Upper outer quadrant Using sterile technique and 1% Lidocaine as local anesthetic, under direct ultrasound visualization, a 12 gauge spring-loaded device was used to perform biopsy of the RIGHT breast mass at  the 9 o'clock axis using a lateral approach. At the conclusion of the procedure heart shaped tissue marker clip was deployed into the biopsy cavity. Site 3: Lesion quadrant: RIGHT axilla Using sterile technique and 1% Lidocaine as local anesthetic, under direct ultrasound visualization, a 14 gauge spring-loaded device was used to perform biopsy of 1 of the enlarged/morphologically abnormal lymph nodes in the RIGHT axilla using a lateral approach. At the conclusion of the procedure tribell tissue marker clip was deployed into the biopsy cavity. Follow up 2 view mammogram was performed and dictated separately. IMPRESSION: 1. Ultrasound guided biopsy of the RIGHT breast mass at the 11 o'clock axis. No apparent complications. 2. Ultrasound guided biopsy of the RIGHT breast mass at the 9 o'clock axis. No apparent complications. 3. Ultrasound guided biopsy of 1 of the enlarged lymph nodes in the RIGHT axilla. No apparent complications. Electronically Signed: By: Franki Cabot M.D. 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 This was found to be concordant by Dr. Franki Cabot. 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 This was found to be concordant by Dr. Franki Cabot. Pathology revealed INVASIVE DUCTAL CARCINOMA INVOLVING NODAL TISSUE of the Right axilla. This was found to be concordant by Dr. Franki Cabot. Pathology results were discussed with the patient by telephone. The patient reported doing well after the biopsies with tenderness at the sites. Post biopsy instructions and care were reviewed and questions were answered. The patient was encouraged to call [facility] for any additional concerns. My direct phone number was provided. The patient was referred to [facility] on March 07, 2021. Pathology results reported by Terie Purser, RN on 02/26/2021. Electronically Signed   By: Franki Cabot M.D.   On: 02/26/2021 15:04   Result Date: 02/26/2021 CLINICAL DATA:  Patient with suspicious masses within the RIGHT breast at the 11 o'clock and 9 o'clock axes, presenting today for ultrasound-guided core biopsies. Patient also with suspicious enlarged/morphologically abnormal lymph nodes in the RIGHT axilla, presenting today for ultrasound-guided core biopsy of 1 of these abnormal-appearing lymph nodes. EXAM: ULTRASOUND GUIDED RIGHT BREAST CORE NEEDLE BIOPSY x2 ULTRASOUND-GUIDED RIGHT AXILLARY LYMPH NODE CORE NEEDLE BIOPSY COMPARISON:  Previous exam(s). PROCEDURE: I met with the patient and we discussed the procedure of ultrasound-guided biopsy, including benefits and alternatives. We discussed the high likelihood of a successful procedure. We discussed the risks of the procedure, including infection, bleeding, tissue injury, clip migration, and inadequate sampling. Informed written consent was given. The usual time-out protocol was performed immediately prior  to the procedure. Site 1: Lesion quadrant: Upper outer quadrant Using sterile technique and 1% Lidocaine as local anesthetic, under direct ultrasound visualization, a 12 gauge spring-loaded device was used to perform biopsy of the hypoechoic mass in the RIGHT breast at the 11 o'clock axis using a medial approach. At the conclusion of the procedure coil shaped tissue marker clip was deployed into the biopsy cavity. Site 2: Lesion quadrant: Upper outer quadrant Using sterile technique and 1% Lidocaine as local anesthetic, under direct ultrasound visualization, a 12 gauge spring-loaded device was used to perform biopsy of the RIGHT breast mass at the 9 o'clock axis using a lateral approach.  At the conclusion of the procedure heart shaped tissue marker clip was deployed into the biopsy cavity. Site 3: Lesion quadrant: RIGHT axilla Using sterile technique and 1% Lidocaine as local anesthetic, under direct ultrasound visualization, a 14 gauge spring-loaded device was used to perform biopsy of 1 of the enlarged/morphologically abnormal lymph nodes in the RIGHT axilla using a lateral approach. At the conclusion of the procedure tribell tissue marker clip was deployed into the biopsy cavity. Follow up 2 view mammogram was performed and dictated separately. IMPRESSION: 1. Ultrasound guided biopsy of the RIGHT breast mass at the 11 o'clock axis. No apparent complications. 2. Ultrasound guided biopsy of the RIGHT breast mass at the 9 o'clock axis. No apparent complications. 3. Ultrasound guided biopsy of 1 of the enlarged lymph nodes in the RIGHT axilla. No apparent complications. Electronically Signed: By: Franki Cabot M.D. On: 02/23/2021 13:48     ELIGIBLE FOR AVAILABLE RESEARCH PROTOCOL:no  ASSESSMENT: 45 y.o. [place] woman status post right breast upper outer quadrant biopsy 02/23/2021 for a clinical T3N1, stage IIB invasive ductal carcinoma, grade 3, triple positive, with an MIB-1 of 35%  (a) right periareolar  biopsy 02/23/2021 was benign/concordant  (b) left breast nodule biopsy pending  (1) genetics testing  (2) neoadjuvant chemotherapy will consist of carboplatin, docetaxel, trastuzumab and Pertuzumab every 21 days x 6 beginning 03/27/2021  (3) anti-HER-2 immunotherapy to continue to total 1 year  (4) definitive surgery to follow  (5) adjuvant radiation  (6) antiestrogens    PLAN: I met today with Arlee to review her new diagnosis. Specifically we discussed the biology of her breast cancer, its diagnosis, staging, treatment  options and prognosis. We first reviewed the fact that cancer is not one disease but more than 100 different diseases and that it is important to keep them separate-- otherwise when friends and relatives discuss their own cancer experiences with Amaiya confusion can result. Similarly we explained that if breast cancer spreads to the bone or liver, the patient would not have bone cancer or liver cancer, but breast cancer in the bone and breast cancer in the liver: one cancer in three places-- not 3 different cancers which otherwise would have to be treated in 3 different ways.  We discussed the difference between local and systemic therapy. In terms of loco-regional treatment, lumpectomy plus radiation is equivalent to mastectomy as far as survival is concerned. For this reason, and because the cosmetic results are generally superior, we recommend breast conserving surgery.   We also noted that in terms of sequencing of treatments, whether systemic therapy or surgery is done first does not affect the ultimate outcome.  This is relevant to this patient's case since we recommend neoadjuvant treatment so that we can obtain prognostic information from the response and also to minimize the need for axillary lymph node dissection.  We then discussed the rationale for systemic therapy. There is some risk that this cancer may have already spread to other parts of her body.  We  are going to obtain a CT of the chest and a bone scan to assure there is no stage IV disease.  Of course this does not mean there is no occult cancer.  By definition occult microscopic cancer cannot be demonstrated by scans.  This is why she needs systemic therapy  Next we went over the options for systemic therapy which are anti-estrogens, anti-HER-2 immunotherapy, and chemotherapy. Kristinia meets criteria for all 3 so the plan is for her to receive standard chemo/anti-HER-2  therapy for 6 doses to be followed by definitive surgery and then continuing anti-HER-2 treatment as appropriate depending on the final surgical results.  Today we discussed the possible toxicities side effects and complications of the chemotherapy which may include permanent alopecia, irreversible peripheral neuropathy, weakening of the heart muscle and permanent menopause  The patient does also qualify for genetics testing In patients who carry a deleterious mutation [for example in a  BRCA gene], the risk of a new breast cancer developing in the future may be sufficiently great that the patient may choose bilateral mastectomies. However if she wishes to keep her breasts in that situation it is safe to do so. That would require intensified screening, which generally means not only yearly mammography but a yearly breast MRI as well.  Kawehi has a good understanding of the overall plan. She agrees with it. She knows the goal of treatment in her case is cure. She will call with any problems that may develop before her next visit here.  Total encounter time 65 minutes.Sarajane Jews C. Primrose Oler, MD 03/07/2021 2:58 PM Medical Oncology and Hematology [facility] [place], [place] [address] Tel. [phone]    Fax. [phone]   This document serves as a record of services personally performed by Lurline Del, MD. It was created on his behalf by Wilburn Mylar, a trained medical scribe. The creation  of this record is based on the scribe's personal observations and the provider's statements to them.   I, Lurline Del MD, have reviewed the above documentation for accuracy and completeness, and I agree with the above.    *Total Encounter Time as defined by the Centers for Medicare and Medicaid Services includes, in addition to the face-to-face time of a patient visit (documented in the note above) non-face-to-face time: obtaining and reviewing outside history, ordering and reviewing medications, tests or procedures, care coordination (communications with other health care professionals or caregivers) and documentation in the medical record.

## 2021-03-07 ENCOUNTER — Other Ambulatory Visit: Payer: Self-pay | Admitting: Oncology

## 2021-03-07 ENCOUNTER — Encounter: Payer: Self-pay | Admitting: Oncology

## 2021-03-07 ENCOUNTER — Inpatient Hospital Stay: Payer: No Typology Code available for payment source

## 2021-03-07 ENCOUNTER — Inpatient Hospital Stay (HOSPITAL_BASED_OUTPATIENT_CLINIC_OR_DEPARTMENT_OTHER): Payer: No Typology Code available for payment source | Admitting: Genetic Counselor

## 2021-03-07 ENCOUNTER — Ambulatory Visit
Admission: RE | Admit: 2021-03-07 | Discharge: 2021-03-07 | Disposition: A | Discharge status: Home / Self Care | Payer: No Typology Code available for payment source | Source: Ambulatory Visit | Attending: Radiation Oncology | Admitting: Radiation Oncology

## 2021-03-07 ENCOUNTER — Ambulatory Visit: Payer: No Typology Code available for payment source | Attending: General Surgery | Admitting: Physical Therapy

## 2021-03-07 ENCOUNTER — Encounter: Payer: Self-pay | Admitting: *Deleted

## 2021-03-07 ENCOUNTER — Other Ambulatory Visit: Payer: Self-pay | Admitting: General Surgery

## 2021-03-07 ENCOUNTER — Inpatient Hospital Stay (HOSPITAL_BASED_OUTPATIENT_CLINIC_OR_DEPARTMENT_OTHER): Payer: No Typology Code available for payment source | Admitting: Oncology

## 2021-03-07 ENCOUNTER — Encounter: Payer: Self-pay | Admitting: Physical Therapy

## 2021-03-07 ENCOUNTER — Other Ambulatory Visit: Payer: Self-pay

## 2021-03-07 VITALS — BP 153/90 | HR 107 | Temp 98.8°F | Resp 18 | Ht 64.0 in | Wt 239.4 lb

## 2021-03-07 DIAGNOSIS — Z170 Estrogen receptor positive status [ER+]: Secondary | ICD-10-CM

## 2021-03-07 DIAGNOSIS — C50411 Malignant neoplasm of upper-outer quadrant of right female breast: Secondary | ICD-10-CM | POA: Insufficient documentation

## 2021-03-07 DIAGNOSIS — R293 Abnormal posture: Secondary | ICD-10-CM | POA: Diagnosis present

## 2021-03-07 DIAGNOSIS — R928 Other abnormal and inconclusive findings on diagnostic imaging of breast: Secondary | ICD-10-CM

## 2021-03-07 DIAGNOSIS — C773 Secondary and unspecified malignant neoplasm of axilla and upper limb lymph nodes: Secondary | ICD-10-CM | POA: Insufficient documentation

## 2021-03-07 DIAGNOSIS — Z793 Long term (current) use of hormonal contraceptives: Secondary | ICD-10-CM | POA: Insufficient documentation

## 2021-03-07 DIAGNOSIS — Z79899 Other long term (current) drug therapy: Secondary | ICD-10-CM | POA: Insufficient documentation

## 2021-03-07 DIAGNOSIS — Z8049 Family history of malignant neoplasm of other genital organs: Secondary | ICD-10-CM

## 2021-03-07 DIAGNOSIS — Z8249 Family history of ischemic heart disease and other diseases of the circulatory system: Secondary | ICD-10-CM

## 2021-03-07 DIAGNOSIS — Z801 Family history of malignant neoplasm of trachea, bronchus and lung: Secondary | ICD-10-CM

## 2021-03-07 DIAGNOSIS — Z833 Family history of diabetes mellitus: Secondary | ICD-10-CM | POA: Insufficient documentation

## 2021-03-07 DIAGNOSIS — N6320 Unspecified lump in the left breast, unspecified quadrant: Secondary | ICD-10-CM

## 2021-03-07 DIAGNOSIS — Z7982 Long term (current) use of aspirin: Secondary | ICD-10-CM | POA: Insufficient documentation

## 2021-03-07 DIAGNOSIS — I10 Essential (primary) hypertension: Secondary | ICD-10-CM | POA: Diagnosis not present

## 2021-03-07 LAB — CBC WITH DIFFERENTIAL (CANCER CENTER ONLY)
Abs Immature Granulocytes: 0.04 10*3/uL (ref 0.00–0.07)
Basophils Absolute: 0.1 10*3/uL (ref 0.0–0.1)
Basophils Relative: 1 %
Eosinophils Absolute: 0.1 10*3/uL (ref 0.0–0.5)
Eosinophils Relative: 1 %
HCT: 39.8 % (ref 36.0–46.0)
Hemoglobin: 13.4 g/dL (ref 12.0–15.0)
Immature Granulocytes: 0 %
Lymphocytes Relative: 17 %
Lymphs Abs: 2.1 10*3/uL (ref 0.7–4.0)
MCH: 29.9 pg (ref 26.0–34.0)
MCHC: 33.7 g/dL (ref 30.0–36.0)
MCV: 88.8 fL (ref 80.0–100.0)
Monocytes Absolute: 0.6 10*3/uL (ref 0.1–1.0)
Monocytes Relative: 5 %
Neutro Abs: 9 10*3/uL — ABNORMAL HIGH (ref 1.7–7.7)
Neutrophils Relative %: 76 %
Platelet Count: 330 10*3/uL (ref 150–400)
RBC: 4.48 MIL/uL (ref 3.87–5.11)
RDW: 14.6 % (ref 11.5–15.5)
WBC Count: 11.9 10*3/uL — ABNORMAL HIGH (ref 4.0–10.5)
nRBC: 0 % (ref 0.0–0.2)

## 2021-03-07 LAB — CMP (CANCER CENTER ONLY)
ALT: 16 U/L (ref 0–44)
AST: 13 U/L — ABNORMAL LOW (ref 15–41)
Albumin: 3.8 g/dL (ref 3.5–5.0)
Alkaline Phosphatase: 63 U/L (ref 38–126)
Anion gap: 10 (ref 5–15)
BUN: 8 mg/dL (ref 6–20)
CO2: 26 mmol/L (ref 22–32)
Calcium: 8.7 mg/dL — ABNORMAL LOW (ref 8.9–10.3)
Chloride: 106 mmol/L (ref 98–111)
Creatinine: 0.92 mg/dL (ref 0.44–1.00)
GFR, Estimated: >60 mL/min (ref >60)
Glucose, Bld: 122 mg/dL — ABNORMAL HIGH (ref 70–99)
Potassium: 3.6 mmol/L (ref 3.5–5.1)
Sodium: 142 mmol/L (ref 135–145)
Total Bilirubin: 0.6 mg/dL (ref 0.3–1.2)
Total Protein: 7 g/dL (ref 6.5–8.1)

## 2021-03-07 LAB — GENETIC SCREENING ORDER

## 2021-03-07 MED ORDER — ONDANSETRON HCL 8 MG PO TABS: 8.0000 mg | ORAL_TABLET | Freq: Two times a day (BID) | ORAL | 1 refills | Status: DC | PRN

## 2021-03-07 MED ORDER — LORATADINE 10 MG PO TABS: 10.0000 mg | ORAL_TABLET | Freq: Every day | ORAL | 4 refills | Status: DC

## 2021-03-07 MED ORDER — PROCHLORPERAZINE MALEATE 10 MG PO TABS: 10.0000 mg | ORAL_TABLET | Freq: Four times a day (QID) | ORAL | 1 refills | Status: DC | PRN

## 2021-03-07 MED ORDER — LIDOCAINE-PRILOCAINE 2.5-2.5 % EX CREA: TOPICAL_CREAM | CUTANEOUS | 3 refills | Status: DC

## 2021-03-07 MED ORDER — DEXAMETHASONE 4 MG PO TABS: 8.0000 mg | ORAL_TABLET | Freq: Two times a day (BID) | ORAL | 1 refills | Status: DC

## 2021-03-07 NOTE — Progress Notes (Signed)
START ON PATHWAY REGIMEN - Breast     A cycle is every 21 days:     Pertuzumab      Pertuzumab      Trastuzumab-xxxx      Trastuzumab-xxxx      Carboplatin      Docetaxel   **Always confirm dose/schedule in your pharmacy ordering system**  Patient Characteristics: Preoperative or Nonsurgical Candidate (Clinical Staging), Neoadjuvant Therapy followed by Surgery, Invasive Disease, Chemotherapy, HER2 Positive, ER Positive Therapeutic Status: Preoperative or Nonsurgical Candidate (Clinical Staging) AJCC M Category: cM0 AJCC Grade: G3 Breast Surgical Plan: Neoadjuvant Therapy followed by Surgery ER Status: Positive (+) AJCC 8 Stage Grouping: IIB HER2 Status: Positive (+) AJCC T Category: cT3 AJCC N Category: cN1 PR Status: Positive (+) Intent of Therapy: Curative Intent, Discussed with Patient

## 2021-03-07 NOTE — Progress Notes (Signed)
[facility] Psychosocial Distress Screening Counseling Intern  Counseling intern was referred by distress screening protocol.  The patient scored a 8 on the Psychosocial Distress Thermometer which indicates severe distress. Counseling intern met with patient in exam room" to assess for distress and other psychosocial needs. Intern met with patient and her long time best friend. Patient reported distress at more of a 3 after clinic and was feeling less nervous overall, despite still being nervous about the actual process of treatment. She reports good support from friends and family and is more worried about sharing the news with her children.   ON[facility] DISTRESS SCREENING 03/07/2021  Screening Type Initial Screening  Distress experienced in past week (1-10) 8  Emotional problem type Nervousness/Anxiety;Adjusting to illness  Information Concerns Type Lack of info about diagnosis;Lack of info about treatment;Lack of info about complementary therapy choices  Referral to support programs Yes    Follow up needed: No.  Emily Fogleman Counseling Intern 

## 2021-03-07 NOTE — Patient Instructions (Signed)

## 2021-03-07 NOTE — Progress Notes (Signed)
Radiation Oncology         [phone] ________________________________  Multidisciplinary Breast Oncology Clinic [facility]) Initial Outpatient Consultation  Name: Diane Miranda MRN: [id number]  Date: 03/07/2021  DOB: 1976/09/29  ZD:GLOVFIEP, Ralene Bathe, MD  Stark Klein, MD   REFERRING PHYSICIAN: Stark Klein, MD  DIAGNOSIS: The encounter diagnosis was Malignant neoplasm of upper-outer quadrant of right breast in female, estrogen receptor positive ([place]).  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 Malignant neoplasm of upper-outer quadrant of right breast in female, estrogen receptor positive ([place])  C50.411    Z17.0     HISTORY OF PRESENT ILLNESS::Diane Miranda is a 46 y.o. female who is presenting to the office today for evaluation of her newly diagnosed breast cancer.  She is doing well overall.   She underwent bilateral diagnostic mammography with tomography and right breast ultrasonography at [facility] on 02/15/2021 for evaluation of palpable right breast mass. Results showed a highly suspicious 5.4 cm mass within the upper outer right breast and two abnormal right axillary lymph nodes. There was also noted to be a 1.8 cm indeterminate hypoechoic area within the upper inner right breast. There was no mammographic evidence of left breast malignancy.  Biopsy on 02/23/2021 showed: grade 3 invasive ductal carcinoma of the right breast at the 9 o'clock position. Right axillary node was positive for invasive ductal carcinoma involving nodal tissue. There was no malignancy identified in the right breast at the 11 o'clock position. Prognostic indicators significant for: estrogen receptor, 40% positive with a moderate staining intensity and progesterone receptor, 60% positive with a strong staining intensity. Proliferation marker Ki67 at 35%. HER2 positive.  MRI of bilateral breasts on 03/04/2021  showed a 4.7 x 4.2 x 4.4 cm mass in the 8-9 o'clock position of the posterior right breast that represented the recently diagnosed malignancy. There were two adjacent abnormal right axillary lymph nodes, one of which represented the biopsy-proven metastatic node. The other was also consistent with metastatic lymphadenopathy. There was significant skin thickening in the periareolar right breast with significant increased T2 signal, consistent with edema. There was also mild abnormal enhancement within the thickened skin, which was non-specific. Finally, there was a 1.1 cm non-specific enhancing mass in the 2 to 3 o'clock position of the left breast.  Biopsy was recommended of this area in the left breast  GYNECOLOGIC HISTORY:  No LMP recorded. Menarche: 45 years old Age at first live birth: 45 years old [place] P 2 LMP early March 2022 (has since had IUD placed) Contraceptive: IUD in place [place]), previously used pills from 1996-2006 HRT n/a Hysterectomy? no BSO? no  The patient was referred today for presentation in the multidisciplinary conference.  Radiology studies and pathology slides were presented there for review and discussion of treatment options.  A consensus was discussed regarding potential next steps.  PREVIOUS RADIATION THERAPY: No  PAST MEDICAL HISTORY:  Past Medical History:  Diagnosis Date  . Allergy 2000  . Asthma    as child  . Headache   . Hypertension   . Prediabetes   . Trimalleolar fracture of ankle, closed, left, initial encounter     PAST SURGICAL HISTORY: Past Surgical History:  Procedure Laterality Date  . FRACTURE SURGERY  2018  . NO PAST SURGERIES    . ORIF ANKLE FRACTURE Left 07/10/2017   Procedure: OPEN REDUCTION INTERNAL FIXATION (ORIF) trimallolar ANKLE FRACTURE;  Surgeon: Wylene Simmer, MD;  Location: [place];  Service: Orthopedics;  Laterality: Left;    FAMILY HISTORY:  Family History  Problem Relation Age of Onset  . Diabetes  Father   . Hypertension Father   . Early death Mother   . Heart disease Mother   . Hypertension Mother   . Hearing loss Maternal Grandmother   . Hypertension Maternal Grandmother   . Lung cancer Paternal Aunt     SOCIAL HISTORY:  Social History   Socioeconomic History  . Marital status: Single    Spouse name: Not on file  . Number of children: Not on file  . Years of education: Not on file  . Highest education level: Not on file  Occupational History  . Not on file  Tobacco Use  . Smoking status: Never Smoker  . Smokeless tobacco: Never Used  Vaping Use  . Vaping Use: Never used  Substance and Sexual Activity  . Alcohol use: Yes    Alcohol/week: 0.0 standard drinks    Comment: social  . Drug use: No  . Sexual activity: Yes    Birth control/protection: I.U.D.  Other Topics Concern  . Not on file  Social History Narrative  . Not on file   Social Determinants of Health   Financial Resource Strain: Not on file  Food Insecurity: Not on file  Transportation Needs: Not on file  Physical Activity: Not on file  Stress: Not on file  Social Connections: Not on file    ALLERGIES: No Known Allergies  MEDICATIONS:  Current Outpatient Medications  Medication Sig Dispense Refill  . aspirin EC 81 MG tablet Take 1 tablet (81 mg total) by mouth 2 (two) times daily. 84 tablet 0  . dexamethasone (DECADRON) 4 MG tablet Take 2 tablets (8 mg total) by mouth 2 (two) times daily. Start the day before Taxotere. Then take daily x 3 days after chemotherapy. 30 tablet 1  . docusate sodium (COLACE) 100 MG capsule Take 1 capsule (100 mg total) by mouth 2 (two) times daily. While taking narcotic pain medicine. 30 capsule 0  . hydrochlorothiazide (MICROZIDE) 12.5 MG capsule Take 12.5 mg by mouth daily.    Marland Kitchen HYDROcodone-acetaminophen (NORCO/VICODIN) 5-325 MG tablet Take 1-2 tablets by mouth every 4 (four) hours as needed for moderate pain. For no more than 3 days. 30 tablet 0  . ketorolac  (TORADOL) 10 MG tablet Take 1 tablet (10 mg total) by mouth every 6 (six) hours as needed. 30 tablet 0  . levonorgestrel (MIRENA) 20 MCG/24HR IUD 1 each by Intrauterine route once.    . lidocaine-prilocaine (EMLA) cream Apply to affected area once 30 g 3  . loratadine (CLARITIN) 10 MG tablet Take 1 tablet (10 mg total) by mouth daily. 90 tablet 4  . ondansetron (ZOFRAN) 8 MG tablet Take 1 tablet (8 mg total) by mouth 2 (two) times daily as needed (Nausea or vomiting). Start on the third day after chemotherapy. 30 tablet 1  . prochlorperazine (COMPAZINE) 10 MG tablet Take 1 tablet (10 mg total) by mouth every 6 (six) hours as needed (Nausea or vomiting). 30 tablet 1  . senna (SENOKOT) 8.6 MG TABS tablet Take 2 tablets (17.2 mg total) by mouth 2 (two) times daily. 30 each 0   No current facility-administered medications for this encounter.    REVIEW OF SYSTEMS: A 10+ POINT REVIEW OF SYSTEMS WAS OBTAINED including neurology, dermatology, psychiatry, cardiac, respiratory, lymph, extremities, GI, GU, musculoskeletal, constitutional, reproductive, HEENT. On  the provided form, she reports palpating the mass along the lateral aspect of the right breast.  No significant pain. She denies new bony pain headaches dizziness or blurred vision and any other symptoms.    PHYSICAL EXAM:  03/07/21 1252  BP: (!) 153/90  Pulse: (!) 107  Resp: 18  Temp: 98.8 F (37.1 C)  SpO2: 100%     Body mass index is 41.09 kg/m.    Lungs are clear to auscultation bilaterally. Heart has regular rate and rhythm. No palpable cervical, supraclavicular, or left axillary adenopathy. Abdomen soft, non-tender, normal bowel sounds. Left breast with no palpable mass, nipple discharge, or bleeding.  Right breast with biopsy site noted in the upper inner quadrant which turned out to be benign.  Patient has a large palpable mass in the lateral aspect of the breast estimated to be approximately 4 x 5 cm. Palpation along the right  axilla reveals approximately 2 palpable lymph nodes   KPS = 90  100 - Normal; no complaints; no evidence of disease. 90   - Able to carry on normal activity; minor signs or symptoms of disease. 80   - Normal activity with effort; some signs or symptoms of disease. 19   - Cares for self; unable to carry on normal activity or to do active work. 60   - Requires occasional assistance, but is able to care for most of his personal needs. 50   - Requires considerable assistance and frequent medical care. 53   - Disabled; requires special care and assistance. 37   - Severely disabled; hospital admission is indicated although death not imminent. 54   - Very sick; hospital admission necessary; active supportive treatment necessary. 10   - Moribund; fatal processes progressing rapidly. 0     - Dead  Karnofsky DA, Abelmann [place], Craver LS and Burchenal [facility] [phone]) The use of the nitrogen mustards in the palliative treatment of carcinoma: with particular reference to bronchogenic carcinoma Cancer 1 634-56  LABORATORY DATA:  Lab Results  Component Value Date   WBC 11.9 (H) 03/07/2021   HGB 13.4 03/07/2021   HCT 39.8 03/07/2021   MCV 88.8 03/07/2021   PLT 330 03/07/2021   Lab Results  Component Value Date   NA 142 03/07/2021   K 3.6 03/07/2021   CL 106 03/07/2021   CO2 26 03/07/2021   Lab Results  Component Value Date   ALT 16 03/07/2021   AST 13 (L) 03/07/2021   ALKPHOS 63 03/07/2021   BILITOT 0.6 03/07/2021    PULMONARY FUNCTION TEST:   Recent Review Flowsheet Data   There is no flowsheet data to display.     RADIOGRAPHY: MR BREAST BILATERAL W WO CONTRAST INC CAD  Result Date: 03/05/2021 CLINICAL DATA:  Recent diagnosis invasive ductal carcinoma of the right breast at 9 o'clock as well as metastatic right axillary lymphadenopathy. Patient also underwent a biopsy of an abnormality in the anterior right breast at 11 o'clock, which revealed adenosis and columnar cell hyperplasia without  malignancy or atypia. Current exam is to assess extent of disease. LABS:  No labs drawn at time of imaging. EXAM: BILATERAL BREAST MRI WITH AND WITHOUT CONTRAST TECHNIQUE: Multiplanar, multisequence MR images of both breasts were obtained prior to and following the intravenous administration of 10 ml of Gadavist Three-dimensional MR images were rendered by post-processing of the original MR data on an independent workstation. The three-dimensional MR images were interpreted, and findings are reported in the following complete MRI report  for this study. Three dimensional images were evaluated at the independent interpreting workstation using the DynaCAD thin client. COMPARISON:  Previous exam(s). FINDINGS: Breast composition: b. Scattered fibroglandular tissue. Background parenchymal enhancement: Marked Right breast: There is an enhancing mass in the right breast laterally measuring 4.7 x 4.2 x 4.4 cm. The mass lies posteriorly, projecting at 8-9 o'clock. Post biopsy clip artifact lies along the mass's superior margin. There is non mass-like enhancement along the lateral margin, which likely reflects post biopsy change. There are no other masses. Post biopsy clip artifact is also noted in the anterior, upper outer right breast from the recent benign biopsy. There is significant periareolar skin thickening, which shows increased T2 signal consistent with edema, but also demonstrates mild enhancement. Left breast: There is a small enhancing mass in the upper outer quadrant between 2 and 3 o'clock, middle depth, measuring 1.1 x 0.5 x 0.8 cm. This mass shows some washout kinetics, but is predominantly plateau type kinetics. There are no other left breast masses or areas of abnormal enhancement. Lymph nodes: 2 adjacent enlarged lymph nodes are noted in the right axilla with maximal cortical thickness is measured at 18 and 12 mm. No other enlarged or abnormal appearing lymph nodes on the right. No abnormal lymph nodes on  the left. Ancillary findings:  None. IMPRESSION: 1. 4.7 x 4.2 x 4.4 cm mass in the 8-9 o'clock position of the posterior right breast represents the recently diagnosed malignancy. 2. Two adjacent abnormal right axillary lymph nodes, 1 of which represents the biopsy-proven metastatic node, the other is also consistent with metastatic lymphadenopathy. 3. Significant skin thickening in the periareolar right breast, with significant increased T2 signal consistent with edema. There is also mild abnormal enhancement within the thickened skin, which is nonspecific. Consider skin punch biopsy. 4. 1.1 cm, nonspecific enhancing mass in the 2 to 3 o'clock position of the left breast. RECOMMENDATION: 1. Recommend MRI guided biopsy of the 1.1 cm enhancing mass in the lateral left at 2-3 o'clock. BI-RADS CATEGORY  4: Suspicious. Electronically Signed   By: Lajean Manes M.D.   On: 03/05/2021 08:55   US BREAST LTD UNI RIGHT INC AXILLA  Result Date: 02/15/2021 CLINICAL DATA:  45 year old female with palpable RIGHT breast mass and pain. Baseline examination. EXAM: DIGITAL DIAGNOSTIC BILATERAL MAMMOGRAM WITH TOMOSYNTHESIS AND CAD; ULTRASOUND RIGHT BREAST LIMITED TECHNIQUE: Bilateral digital diagnostic mammography and breast tomosynthesis was performed. The images were evaluated with computer-aided detection.; Targeted ultrasound examination of the right breast was performed COMPARISON:  None. ACR Breast Density Category b: There are scattered areas of fibroglandular density. FINDINGS: 2D/3D full field views of both breasts demonstrate a large indistinct mass within the posterior OUTER RIGHT breast. An asymmetry in the RETROAREOLAR RIGHT breast is noted. At least 2 lymph nodes with cortical thickening are identified within the RIGHT axilla. No suspicious mammographic findings within the LEFT breast are noted. On physical exam, a large firm palpable mass identified at the 9 o'clock position of the RIGHT breast 8 cm from the nipple.  Targeted ultrasound of the RIGHT breast is performed, showing the following: A 3.7 x 3.2 x 5.4 cm irregular hypoechoic mass at the 9 o'clock position 8 cm from the nipple. A 0.8 x 0.7 x 1.8 cm irregular hypoechoic area at the 11 o'clock position 3 cm from the nipple. Two abnormal RIGHT axillary lymph nodes with cortical thickening of up to 0.9 cm. IMPRESSION: 1. Highly suspicious 5.4 cm mass within the OUTER RIGHT breast and 2 abnormal  RIGHT axillary lymph nodes. Tissue sampling of this mass and 1 of the RIGHT axillary lymph nodes is recommended. 2. 1.8 cm indeterminate hypoechoic area within the UPPER-OUTER RIGHT breast. Tissue sampling is recommended. 3. No mammographic evidence of LEFT breast malignancy. RECOMMENDATION: Ultrasound-guided biopsies of the 5.4 cm OUTER RIGHT breast mass, the 1.8 cm UPPER-OUTER RIGHT breast hypoechoic area and 1 of the enlarged RIGHT axillary lymph nodes. These procedures will be scheduled. I have discussed the findings and recommendations with the patient. If applicable, a reminder letter will be sent to the patient regarding the next appointment. BI-RADS CATEGORY  5: Highly suggestive of malignancy. Electronically Signed   By: Margarette Canada M.D.   On: 02/15/2021 15:54   MM DIAG BREAST TOMO BILATERAL  Result Date: 02/15/2021 CLINICAL DATA:  45 year old female with palpable RIGHT breast mass and pain. Baseline examination. EXAM: DIGITAL DIAGNOSTIC BILATERAL MAMMOGRAM WITH TOMOSYNTHESIS AND CAD; ULTRASOUND RIGHT BREAST LIMITED TECHNIQUE: Bilateral digital diagnostic mammography and breast tomosynthesis was performed. The images were evaluated with computer-aided detection.; Targeted ultrasound examination of the right breast was performed COMPARISON:  None. ACR Breast Density Category b: There are scattered areas of fibroglandular density. FINDINGS: 2D/3D full field views of both breasts demonstrate a large indistinct mass within the posterior OUTER RIGHT breast. An asymmetry in the  RETROAREOLAR RIGHT breast is noted. At least 2 lymph nodes with cortical thickening are identified within the RIGHT axilla. No suspicious mammographic findings within the LEFT breast are noted. On physical exam, a large firm palpable mass identified at the 9 o'clock position of the RIGHT breast 8 cm from the nipple. Targeted ultrasound of the RIGHT breast is performed, showing the following: A 3.7 x 3.2 x 5.4 cm irregular hypoechoic mass at the 9 o'clock position 8 cm from the nipple. A 0.8 x 0.7 x 1.8 cm irregular hypoechoic area at the 11 o'clock position 3 cm from the nipple. Two abnormal RIGHT axillary lymph nodes with cortical thickening of up to 0.9 cm. IMPRESSION: 1. Highly suspicious 5.4 cm mass within the OUTER RIGHT breast and 2 abnormal RIGHT axillary lymph nodes. Tissue sampling of this mass and 1 of the RIGHT axillary lymph nodes is recommended. 2. 1.8 cm indeterminate hypoechoic area within the UPPER-OUTER RIGHT breast. Tissue sampling is recommended. 3. No mammographic evidence of LEFT breast malignancy. RECOMMENDATION: Ultrasound-guided biopsies of the 5.4 cm OUTER RIGHT breast mass, the 1.8 cm UPPER-OUTER RIGHT breast hypoechoic area and 1 of the enlarged RIGHT axillary lymph nodes. These procedures will be scheduled. I have discussed the findings and recommendations with the patient. If applicable, a reminder letter will be sent to the patient regarding the next appointment. BI-RADS CATEGORY  5: Highly suggestive of malignancy. Electronically Signed   By: Margarette Canada M.D.   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 This was found to be concordant by Dr. Franki Cabot. 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 This was found to be concordant by Dr. Franki Cabot.  Pathology revealed INVASIVE DUCTAL CARCINOMA INVOLVING NODAL TISSUE of the Right axilla. This was found to be concordant by Dr. Franki Cabot. Pathology results were discussed with the patient by telephone. The patient reported doing well after the biopsies with tenderness at the sites. Post biopsy instructions and care were reviewed and questions were answered.  The patient was encouraged to call [facility] for any additional concerns. My direct phone number was provided. The patient was referred to [facility] at [facility] on March 07, 2021. Pathology results reported by Terie Purser, RN on 02/26/2021. Electronically Signed   By: Franki Cabot M.D.   On: 02/26/2021 15:04   Result Date: 02/26/2021 CLINICAL DATA:  Patient with suspicious masses within the RIGHT breast at the 11 o'clock and 9 o'clock axes, presenting today for ultrasound-guided core biopsies. Patient also with suspicious enlarged/morphologically abnormal lymph nodes in the RIGHT axilla, presenting today for ultrasound-guided core biopsy of 1 of these abnormal-appearing lymph nodes. EXAM: ULTRASOUND GUIDED RIGHT BREAST CORE NEEDLE BIOPSY x2 ULTRASOUND-GUIDED RIGHT AXILLARY LYMPH NODE CORE NEEDLE BIOPSY COMPARISON:  Previous exam(s). PROCEDURE: I met with the patient and we discussed the procedure of ultrasound-guided biopsy, including benefits and alternatives. We discussed the high likelihood of a successful procedure. We discussed the risks of the procedure, including infection, bleeding, tissue injury, clip migration, and inadequate sampling. Informed written consent was given. The usual time-out protocol was performed immediately prior to the procedure. Site 1: Lesion quadrant: Upper outer quadrant Using sterile technique and 1% Lidocaine as local anesthetic, under direct ultrasound visualization, a 12 gauge spring-loaded device was used to perform biopsy of  the hypoechoic mass in the RIGHT breast at the 11 o'clock axis using a medial approach. At the conclusion of the procedure coil shaped tissue marker clip was deployed into the biopsy cavity. Site 2: Lesion quadrant: Upper outer quadrant Using sterile technique and 1% Lidocaine as local anesthetic, under direct ultrasound visualization, a 12 gauge spring-loaded device was used to perform biopsy of the RIGHT breast mass at the 9 o'clock axis using a lateral approach. At the conclusion of the procedure heart shaped tissue marker clip was deployed into the biopsy cavity. Site 3: Lesion quadrant: RIGHT axilla Using sterile technique and 1% Lidocaine as local anesthetic, under direct ultrasound visualization, a 14 gauge spring-loaded device was used to perform biopsy of 1 of the enlarged/morphologically abnormal lymph nodes in the RIGHT axilla using a lateral approach. At the conclusion of the procedure tribell tissue marker clip was deployed into the biopsy cavity. Follow up 2 view mammogram was performed and dictated separately. IMPRESSION: 1. Ultrasound guided biopsy of the RIGHT breast mass at the 11 o'clock axis. No apparent complications. 2. Ultrasound guided biopsy of the RIGHT breast mass at the 9 o'clock axis. No apparent complications. 3. Ultrasound guided biopsy of 1 of the enlarged lymph nodes in the RIGHT axilla. No apparent complications. Electronically Signed: By: Franki Cabot M.D. On: 02/23/2021 13:48   MM CLIP PLACEMENT RIGHT  Result Date: 02/23/2021 CLINICAL DATA:  Status post ultrasound-guided biopsies for suspicious masses in the RIGHT breast at the 9 o'clock and 11 o'clock axes. Also status post ultrasound-guided biopsy of an enlarged lymph node in the RIGHT axilla. EXAM: DIAGNOSTIC RIGHT MAMMOGRAM POST ULTRASOUND BIOPSY x3 COMPARISON:  Previous exam(s). FINDINGS: Mammographic images were obtained following ultrasound guided biopsy of suspicious masses in the RIGHT breast at the 9 o'clock and 11  o'clock axes and ultrasound-guided biopsy of an enlarged lymph node in the RIGHT axilla. The biopsy marking clips are in expected positions at the sites of biopsy. IMPRESSION: 1. Appropriate positioning of the coil shaped biopsy marking clip at the site of biopsy in the upper outer quadrant, corresponding to the targeted mass at the 11 o'clock  axis. 2. Appropriate positioning of the heart shaped biopsy marking clip at the site of biopsy in the upper outer quadrant, corresponding to the targeted mass at the 9 o'clock axis. 3. Appropriate positioning of the tribell shaped biopsy marking clip at the site of biopsy in the lower RIGHT axilla. Final Assessment: Post Procedure Mammograms for Marker Placement Electronically Signed   By: Franki Cabot M.D.   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 This was found to be concordant by Dr. Franki Cabot. 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 This was found to be concordant by Dr. Franki Cabot. Pathology revealed INVASIVE DUCTAL CARCINOMA INVOLVING NODAL TISSUE of the Right axilla. This was found to be concordant by Dr. Franki Cabot. Pathology results were discussed with the patient by telephone. The patient reported doing well after the biopsies with tenderness at the sites. Post biopsy instructions and care were reviewed and questions were answered. The patient was encouraged to call [facility] for any additional concerns. My direct phone number was provided. The patient was referred to [facility] on March 07, 2021. Pathology results reported by Terie Purser, RN on 02/26/2021. Electronically Signed   By: Franki Cabot M.D.   On: 02/26/2021 15:04   Result Date: 02/26/2021 CLINICAL DATA:  Patient with suspicious masses within the RIGHT breast at the 11 o'clock and 9 o'clock axes, presenting today for ultrasound-guided core biopsies. Patient also with suspicious enlarged/morphologically abnormal lymph nodes in the RIGHT axilla, presenting today for ultrasound-guided core biopsy of 1 of these abnormal-appearing lymph nodes. EXAM: ULTRASOUND GUIDED RIGHT BREAST CORE NEEDLE BIOPSY x2 ULTRASOUND-GUIDED RIGHT AXILLARY LYMPH NODE CORE NEEDLE BIOPSY COMPARISON:  Previous exam(s). PROCEDURE: I met with the patient and we discussed the procedure of ultrasound-guided biopsy, including benefits and alternatives. We discussed the high likelihood of a successful procedure. We discussed the risks of the procedure, including infection, bleeding, tissue injury, clip migration, and inadequate sampling. Informed written consent was given. The usual time-out protocol was performed immediately prior to the procedure. Site 1: Lesion quadrant: Upper outer quadrant Using sterile technique and 1% Lidocaine as local anesthetic, under direct ultrasound visualization, a 12 gauge spring-loaded device was used to perform biopsy of the hypoechoic mass in the RIGHT breast at the 11 o'clock axis using a medial approach. At the conclusion of the procedure coil shaped tissue marker clip was deployed into the biopsy cavity. Site 2: Lesion quadrant: Upper outer quadrant Using sterile technique and 1% Lidocaine as local anesthetic, under direct ultrasound visualization, a 12 gauge spring-loaded device was used to perform biopsy of the RIGHT breast mass at the 9 o'clock axis using a lateral approach. At the conclusion of the procedure heart shaped tissue marker clip was deployed into the biopsy cavity. Site 3: Lesion quadrant: RIGHT axilla Using sterile technique and 1% Lidocaine as local anesthetic, under direct ultrasound visualization, a 14 gauge  spring-loaded device was used to perform biopsy of 1 of the enlarged/morphologically abnormal lymph nodes in the RIGHT axilla using a lateral approach. At the conclusion of the procedure tribell tissue marker clip was deployed into the biopsy cavity. Follow up 2 view mammogram was performed and dictated  separately. IMPRESSION: 1. Ultrasound guided biopsy of the RIGHT breast mass at the 11 o'clock axis. No apparent complications. 2. Ultrasound guided biopsy of the RIGHT breast mass at the 9 o'clock axis. No apparent complications. 3. Ultrasound guided biopsy of 1 of the enlarged lymph nodes in the RIGHT axilla. No apparent complications. Electronically Signed: By: Franki Cabot M.D. 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 This was found to be concordant by Dr. Franki Cabot. 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 This was found to be concordant by Dr. Franki Cabot. Pathology revealed INVASIVE DUCTAL CARCINOMA INVOLVING NODAL TISSUE of the Right axilla. This was found to be concordant by Dr. Franki Cabot. Pathology results were discussed with the patient by telephone. The patient reported doing well after the biopsies with tenderness at the sites. Post biopsy instructions and care were reviewed and questions were answered. The patient was encouraged to call [facility] for any additional concerns. My direct phone number was provided. The patient was referred to [facility] on March 07, 2021. Pathology results reported by Terie Purser, RN on 02/26/2021. Electronically Signed   By: Franki Cabot M.D.   On: 02/26/2021 15:04   Result Date: 02/26/2021 CLINICAL  DATA:  Patient with suspicious masses within the RIGHT breast at the 11 o'clock and 9 o'clock axes, presenting today for ultrasound-guided core biopsies. Patient also with suspicious enlarged/morphologically abnormal lymph nodes in the RIGHT axilla, presenting today for ultrasound-guided core biopsy of 1 of these abnormal-appearing lymph nodes. EXAM: ULTRASOUND GUIDED RIGHT BREAST CORE NEEDLE BIOPSY x2 ULTRASOUND-GUIDED RIGHT AXILLARY LYMPH NODE CORE NEEDLE BIOPSY COMPARISON:  Previous exam(s). PROCEDURE: I met with the patient and we discussed the procedure of ultrasound-guided biopsy, including benefits and alternatives. We discussed the high likelihood of a successful procedure. We discussed the risks of the procedure, including infection, bleeding, tissue injury, clip migration, and inadequate sampling. Informed written consent was given. The usual time-out protocol was performed immediately prior to the procedure. Site 1: Lesion quadrant: Upper outer quadrant Using sterile technique and 1% Lidocaine as local anesthetic, under direct ultrasound visualization, a 12 gauge spring-loaded device was used to perform biopsy of the hypoechoic mass in the RIGHT breast at the 11 o'clock axis using a medial approach. At the conclusion of the procedure coil shaped tissue marker clip was deployed into the biopsy cavity. Site 2: Lesion quadrant: Upper outer quadrant Using sterile technique and 1% Lidocaine as local anesthetic, under direct ultrasound visualization, a 12 gauge spring-loaded device was used to perform biopsy of the RIGHT breast mass at the 9 o'clock axis using a lateral approach. At the conclusion of the procedure heart shaped tissue marker clip was deployed into the biopsy cavity. Site 3: Lesion quadrant: RIGHT axilla Using sterile technique and 1% Lidocaine as local anesthetic, under direct ultrasound visualization, a 14 gauge spring-loaded device was used to perform biopsy of 1 of the  enlarged/morphologically abnormal lymph nodes in the RIGHT axilla using a lateral approach. At the conclusion of the procedure tribell tissue marker clip was deployed into the biopsy cavity. Follow up 2 view mammogram was performed and dictated separately. IMPRESSION: 1. Ultrasound guided biopsy of the  RIGHT breast mass at the 11 o'clock axis. No apparent complications. 2. Ultrasound guided biopsy of the RIGHT breast mass at the 9 o'clock axis. No apparent complications. 3. Ultrasound guided biopsy of 1 of the enlarged lymph nodes in the RIGHT axilla. No apparent complications. Electronically Signed: By: Franki Cabot M.D. On: 02/23/2021 13:48      IMPRESSION: Stage II-B (clinical T3N1) Right Breast UOQ, Invasive Ductal Carcinoma, ER+ / PR+ / Her2+, Grade 3  The patient will be a good candidate for breast conservation with radiotherapy to the right breast. We discussed the general course of radiation, potential side effects, and toxicities with radiation and the patient is interested in this approach.  Would also recommend coverage of the axillary region given her biopsy-proven disease in this area.  Would recommend postmastectomy radiation therapy if she deems not be a candidate for breast conserving surgery.   PLAN:  1. Genetics testing 2. Staging work-up 3. Biopsy of left breast lesion found on MRI 4. neoadjuvant chemotherapy will consist of carboplatin, docetaxel, trastuzumab and Pertuzumab every 21 days x 6 beginning 03/27/2021 5. Anti-HER-2/neu therapy continuing for 1 year total 6. Definitive surgery to follow-up 7. adjuvant radiation therapy 8. Adjuvant hormonal therapy    ------------------------------------------------  Blair Promise, PhD, MD  This document serves as a record of services personally performed by Gery Pray, MD. It was created on his behalf by Clerance Lav, a trained medical scribe. The creation of this record is based on the scribe's personal observations and the  provider's statements to them. This document has been checked and approved by the attending provider.

## 2021-03-07 NOTE — Therapy (Signed)
[place], [place], [address] Phone: [phone]   Fax:  [phone]  Physical Therapy Evaluation  Patient Details  Name: Diane Miranda MRN: [id number] Date of Birth: [birth year] Referring Provider (PT): Dr. Stark Klein   Encounter Date: 03/07/2021   PT End of Session - 03/07/21 1557    Visit Number 1    Number of Visits 2    Date for PT Re-Evaluation 09/07/21    PT Start Time 1512    PT Stop Time 1543    PT Time Calculation (min) 31 min    Activity Tolerance Patient tolerated treatment well    Behavior During Therapy [facility] for tasks assessed/performed           Past Medical History:  Diagnosis Date  . Allergy 2000  . Asthma    as child  . Headache   . Hypertension   . Prediabetes   . Trimalleolar fracture of ankle, closed, left, initial encounter     Past Surgical History:  Procedure Laterality Date  . FRACTURE SURGERY  2018  . NO PAST SURGERIES    . ORIF ANKLE FRACTURE Left 07/10/2017   Procedure: OPEN REDUCTION INTERNAL FIXATION (ORIF) trimallolar ANKLE FRACTURE;  Surgeon: Wylene Simmer, MD;  Location: [facility];  Service: Orthopedics;  Laterality: Left;    There were no vitals filed for this visit.    Subjective Assessment - 03/07/21 1547    Subjective Patient reports she is here today to be seen by her medical team for her newly diagnosed right breast cancer.    Patient is accompained by: Family member    Pertinent History Patient was diagnosed on 02/15/2021 with right grade III invasive ductal carcinoma breast cancer. It measures 5.4 cm and is located in the upper outer quadrant. It is triple positive with a Ki67 of 35%. She had an axillary lymph node biopsied and it was found to be positive.    Patient Stated Goals Reduce lymphedema risk and learn post op shoulder ROM HEP    Currently in Pain? No/denies              [facility] PT Assessment - 03/07/21 0001       Assessment   Medical Diagnosis Right breast cancer    Referring Provider (PT) Dr. Stark Klein    Onset Date/Surgical Date 02/15/21    Hand Dominance Right    Prior Therapy none      Precautions   Precautions Other (comment)    Precaution Comments active cancer      Restrictions   Weight Bearing Restrictions No      Balance Screen   Has the patient fallen in the past 6 months No    Has the patient had a decrease in activity level because of a fear of falling?  No    Is the patient reluctant to leave their home because of a fear of falling?  No      Home Ecologist residence    Living Arrangements Children;Other relatives   Grandmother, 78 and 55 y.o. kids   Available Help at Discharge Family      Prior Function   Level of [place] Full time employment    Research scientist (physical sciences)    Leisure She walks 3-4x/week for 30 minutes      Cognition   Overall Cognitive Status Within Functional Limits for tasks assessed  Posture/Postural Control   Posture/Postural Control Postural limitations    Postural Limitations Rounded Shoulders;Forward head      ROM / Strength   AROM / PROM / Strength AROM;Strength      AROM   Overall AROM Comments Cervical AROM is WNL    AROM Assessment Site Shoulder    Right/Left Shoulder Right;Left    Right Shoulder Extension 48 Degrees    Right Shoulder Flexion 157 Degrees    Right Shoulder ABduction 156 Degrees    Right Shoulder Internal Rotation 51 Degrees    Right Shoulder External Rotation 88 Degrees    Left Shoulder Extension 48 Degrees    Left Shoulder Flexion 150 Degrees    Left Shoulder ABduction 159 Degrees    Left Shoulder Internal Rotation 65 Degrees    Left Shoulder External Rotation 82 Degrees      Strength   Overall Strength Within functional limits for tasks performed             LYMPHEDEMA/ONCOLOGY QUESTIONNAIRE - 03/07/21 0001      Type    Cancer Type Right breast cancer      Lymphedema Assessments   Lymphedema Assessments Upper extremities      Right Upper Extremity Lymphedema   10 cm Proximal to Olecranon Process 42.6 cm    Olecranon Process 29.9 cm    10 cm Proximal to Ulnar Styloid Process 28.8 cm    Just Proximal to Ulnar Styloid Process 18.6 cm    Across Hand at [employer] 20.1 cm    At [place] of 2nd Digit 6.7 cm      Left Upper Extremity Lymphedema   10 cm Proximal to Olecranon Process 41.1 cm    Olecranon Process 30.4 cm    10 cm Proximal to Ulnar Styloid Process 28 cm    Just Proximal to Ulnar Styloid Process 18.1 cm    Across Hand at [employer] 19.9 cm    At [place] of 2nd Digit 6.5 cm           L-DEX FLOWSHEETS - 03/07/21 1500      L-DEX LYMPHEDEMA SCREENING   Measurement Type Unilateral    L-DEX MEASUREMENT EXTREMITY Upper Extremity    POSITION  Standing    DOMINANT SIDE Right    At Risk Side Right    BASELINE SCORE (UNILATERAL) -5.2          The patient was assessed using the L-Dex machine today to produce a lymphedema index baseline score. The patient will be reassessed on a regular basis (typically every 3 months) to obtain new L-Dex scores. If the score is > 6.5 points away from his/her baseline score indicating onset of subclinical lymphedema, it will be recommended to wear a compression garment for 4 weeks, 12 hours per day and then be reassessed. If the score continues to be > 6.5 points from baseline at reassessment, we will initiate lymphedema treatment. Assessing in this manner has a 95% rate of preventing clinically significant lymphedema.     [name] - 03/07/21 0001    Open a tight or new jar No difficulty    Do heavy household chores (wash walls, wash floors) No difficulty    Carry a shopping bag or briefcase No difficulty    Wash your back No difficulty    Use a knife to cut food No difficulty    Recreational activities in which you take some force or impact through your  arm, shoulder, or hand (  golf, hammering, tennis) No difficulty    During the past week, to what extent has your arm, shoulder or hand problem interfered with your normal social activities with family, friends, neighbors, or groups? Not at all    During the past week, to what extent has your arm, shoulder or hand problem limited your work or other regular daily activities Not at all    Arm, shoulder, or hand pain. Mild    Tingling (pins and needles) in your arm, shoulder, or hand None    Difficulty Sleeping No difficulty    DASH Score 2.27 %            Objective measurements completed on examination: See above findings.      Patient was instructed today in a home exercise program today for post op shoulder range of motion. These included active assist shoulder flexion in sitting, scapular retraction, wall walking with shoulder abduction, and hands behind head external rotation.  She was encouraged to do these twice a day, holding 3 seconds and repeating 5 times when permitted by her physician.             PT Education - 03/07/21 1556    Education Details Lymphedema risk reduction and post op shoulder ROM HEP    Person(s) Educated Patient;Other (comment)   best friend   Methods Explanation;Demonstration;Handout    Comprehension Returned demonstration;Verbalized understanding               PT Long Term Goals - 03/07/21 1604      PT LONG TERM GOAL #1   Title Patient will demonstrate she has regained full shoulder ROM and function post operatively compared to baselines.    Time 6    Period Months    Status New    Target Date 09/07/21           Breast Clinic Goals - 03/07/21 1604      Patient will be able to verbalize understanding of pertinent lymphedema risk reduction practices relevant to her diagnosis specifically related to skin care.   Time 1    Period Days    Status Achieved      Patient will be able to return demonstrate and/or verbalize understanding of  the post-op home exercise program related to regaining shoulder range of motion.   Time 1    Period Days    Status Achieved      Patient will be able to verbalize understanding of the importance of attending the postoperative After Breast Cancer Class for further lymphedema risk reduction education and therapeutic exercise.   Time 1    Period Days    Status Achieved                 Plan - 03/07/21 1558    Clinical Impression Statement Patient was diagnosed on 02/15/2021 with right grade III invasive ductal carcinoma breast cancer. It measures 5.4 cm and is located in the upper outer quadrant. It is triple positive with a Ki67 of 35%. She had an axillary lymph node biopsied and it was found to be positive. Her multidisciplinary medical team met prior to her assessments to determine a recommended treatment plan. She is planning to have neoadjuvant chemotherapy followed by a right lumpectomy and sentinel node biopsy, radiation, and anti-estrogen therapy. She will benefit from a post op PT reassessment to determine needs and from L-Dex screens every 3 months to detect subclinical lymphedema.    Stability/Clinical Decision Making Stable/Uncomplicated    Clinical  Decision Making Low    Rehab Potential Excellent    PT Frequency --   Eval and 1 f/u visit   PT Treatment/Interventions ADLs/Self Care Home Management;Therapeutic exercise;Patient/family education    PT Next Visit Plan Will reassess 3-4 weeks post op to determine needs    PT Home Exercise Plan Post op shoulder ROM HEP    Consulted and Agree with Plan of Care Patient;Family member/caregiver    Family Member Consulted best friend           Patient will benefit from skilled therapeutic intervention in order to improve the following deficits and impairments:  Decreased knowledge of precautions,Postural dysfunction,Decreased range of motion,Impaired UE functional use,Pain  Visit Diagnosis: Malignant neoplasm of upper-outer quadrant  of right breast in female, estrogen receptor positive ([facility]) - Plan: PT plan of care cert/re-cert  Abnormal posture - Plan: PT plan of care cert/re-cert   Patient will follow up at outpatient cancer rehab 3-4 weeks following surgery.  If the patient requires physical therapy at that time, a specific plan will be dictated and sent to the referring physician for approval. The patient was educated today on appropriate basic range of motion exercises to begin post operatively and the importance of attending the After Breast Cancer class following surgery.  Patient was educated today on lymphedema risk reduction practices as it pertains to recommendations that will benefit the patient immediately following surgery.  She verbalized good understanding.      Problem List Patient Active Problem List   Diagnosis Date Noted  . Malignant neoplasm of upper-outer quadrant of right breast in female, estrogen receptor positive ([place]) 03/01/2021  . SINUSITIS - ACUTE-NOS 09/07/2009  . EXUDATIVE PHARYNGITIS 09/07/2009  . LOW HDL 08/03/2008  . 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 OBESITY 08/01/2008  . ALLERGIC RHINITIS 08/01/2008  . VAGINITIS 08/01/2008  . HEADACHE 08/01/2008   Annia Friendly, PT 03/07/21 4:06 PM  [place], [place], [address] Phone: [phone]   Fax:  [phone]  Name: Diane Miranda MRN: [id number] Date of Birth: October 30, 1976

## 2021-03-07 NOTE — Progress Notes (Signed)
REFERRING PROVIDER: Chauncey Cruel, MD [place],  [place] [address]  PRIMARY PROVIDER:  [name], MD  PRIMARY REASON FOR VISIT:  Encounter Diagnoses  Name Primary?  . Malignant neoplasm of upper-outer quadrant of right breast in female, estrogen receptor positive ([place]) Yes  . Family history of uterine cancer   . Family history of lung cancer      HISTORY OF PRESENT ILLNESS:   Ms. Diane Miranda, a 45 y.o. female, was seen for a [facility] cancer genetics consultation at the request of Dr. Jana Hakim due to a personal history of cancer.  Ms. Waldron presents to clinic today with her friend to discuss the possibility of a hereditary predisposition to cancer, to discuss genetic testing, and to further clarify her future cancer risks, as well as potential cancer risks for family members.   In March 2022, at the age of 32, Ms. Detwiler was diagnosed with invasive ductal carcinoma of the right breast (ER+/PR+/HER2+). The preliminary treatment plan includes neoadjuvant chemotherapy.   CANCER HISTORY:  Oncology History  Malignant neoplasm of upper-outer quadrant of right breast in female, estrogen receptor positive ([place])  03/01/2021 Initial Diagnosis   Malignant neoplasm of upper-outer quadrant of right breast in female, estrogen receptor positive ([place])   03/07/2021 Cancer Staging   Staging form: Breast, AJCC 8th Edition - Clinical: Stage IIB (cT3, cN1, cM0, G3, ER+, PR+, HER2+) - Signed by Chauncey Cruel, MD on 03/07/2021 Histologic grading system: 3 grade system    RISK FACTORS:  Menarche was at age 60.  First live birth at age 77.  OCP use for approximately 10 years.  Ovaries intact: yes.  Hysterectomy: no.  Menopausal status: premenopausal.  HRT use: 0 years. Colonoscopy: no; not examined. Mammogram within the last year: yes. Up to date with pelvic exams: most recent PAP two years ago.  Past Medical History:  Diagnosis Date  . Allergy 2000  .  Asthma    as child  . Headache   . Hypertension   . Prediabetes   . Trimalleolar fracture of ankle, closed, left, initial encounter     Past Surgical History:  Procedure Laterality Date  . FRACTURE SURGERY  2018  . NO PAST SURGERIES    . ORIF ANKLE FRACTURE Left 07/10/2017   Procedure: OPEN REDUCTION INTERNAL FIXATION (ORIF) trimallolar ANKLE FRACTURE;  Surgeon: Wylene Simmer, MD;  Location: [facility];  Service: Orthopedics;  Laterality: Left;    Social History   Socioeconomic History  . Marital status: Single    Spouse name: Not on file  . Number of children: Not on file  . Years of education: Not on file  . Highest education level: Not on file  Occupational History  . Not on file  Tobacco Use  . Smoking status: Never Smoker  . Smokeless tobacco: Never Used  Vaping Use  . Vaping Use: Never used  Substance and Sexual Activity  . Alcohol use: Yes    Alcohol/week: 0.0 standard drinks    Comment: social  . Drug use: No  . Sexual activity: Yes    Birth control/protection: I.U.D.  Other Topics Concern  . Not on file  Social History Narrative  . Not on file   Social Determinants of Health   Financial Resource Strain: Not on file  Food Insecurity: Not on file  Transportation Needs: Not on file  Physical Activity: Not on file  Stress: Not on file  Social Connections: Not on file  FAMILY HISTORY:  We obtained a detailed, 4-generation family history.  Significant diagnoses are listed below: Family History  Problem Relation Age of Onset  . Uterine cancer Other 28       MGM's sister  . Throat cancer Other        MGM's brother; dx after 67  . Lung cancer Other        PGM's sister; dx 1s    Ms. Montfort has two biological daughters, ages 37 and 85, and one daughter, age 28, who was adopted into the family.  Ms. Puskas has two paternal half brothers, ages 9 and 51, both without a cancer history.    Ms. Gullatt mother passed away at age 21  and did not have cancer.  Ms. Lober mother did not have siblings.  Ms. Bolser maternal grandmother had a sister with uterine cancer diagnosed at 14 and a brother with throat cancer diagnosed after the age of 39.    Ms. Siegfried father is living at age 34 and did not have cancer.  Ms. Kappes paternal grandmother passed away before 22 of an unknown cause, possibly cancer per Ms. Marzano.  Ms. Swayze paternal grandmother's sister had lung cancer diagnosed in her 36s.   Ms. Dedic is unaware of previous family history of genetic testing for hereditary cancer risks. There is no reported Ashkenazi Jewish ancestry. There is no known consanguinity.  GENETIC COUNSELING ASSESSMENT: Ms. Scarber is a 45 y.o. female with a personal history of cancer which is somewhat suggestive of a hereditary cancer syndrome and predisposition to cancer given her age of diagnosis as well a non-informative family given the family size. We, therefore, discussed and recommended the following at today's visit.   DISCUSSION: We discussed that 5 - 10% of cancer is hereditary, with most cases of hereditary breast cancer associated with mutations in BRCA1/2.  There are other genes that can be associated with hereditary breast cancer syndromes.  Type of cancer risk and level of risk are gene-specific.  We discussed that testing is beneficial for several reasons including knowing how to follow individuals after completing their treatment, identifying whether potential treatment options would be beneficial, and understanding if other family members could be at risk for cancer and allowing them to undergo genetic testing.   We reviewed the characteristics, features and inheritance patterns of hereditary cancer syndromes. We also discussed genetic testing, including the appropriate family members to test, the process of testing, insurance coverage and turn-around-time for results. We discussed the implications of a negative,  positive, carrier and/or variant of uncertain significant result. We recommended Ms. Gangemi pursue genetic testing for a panel that includes genes associated with breast cancer.   Ms. Brossard  was offered a common hereditary cancer panel (47 genes) and an expanded pan-cancer panel (77 genes). Ms. Wooden was informed of the benefits and limitations of each panel, including that expanded pan-cancer panels contain genes that do not have clear management guidelines at this point in time.  We also discussed that as the number of genes included on a panel increases, the chances of variants of uncertain significance increases.  After considering the benefits and limitations of each gene panel, Ms. Hallberg  elected to have a common hereditary cancers panel through [facility].   The CustomNext-Cancer+RNAinsight panel offered by [facility] includes sequencing and rearrangement analysis for the following 47 genes:  APC, ATM, AXIN2, BARD1, BMPR1A, BRCA1, BRCA2, BRIP1, CDH1, CDK4, CDKN2A, CHEK2, DICER1, EPCAM, GREM1, HOXB13, MEN1, MLH1, MSH2, MSH3, MSH6, MUTYH,  NBN, NF1, NF2, NTHL1, PALB2, PMS2, POLD1, POLE, PTEN, RAD51C, RAD51D, RECQL, RET, SDHA, SDHAF2, SDHB, SDHC, SDHD, SMAD4, SMARCA4, STK11, TP53, TSC1, TSC2, and VHL.  RNA data is routinely analyzed for use in variant interpretation for all genes.  Based on Ms. Hellstrom's personal history of breast cancer before the age of 54, she meets medical criteria for genetic testing. Despite that she meets criteria, she may still have an out of pocket cost. We discussed that if her out of pocket cost for testing is over $100, the laboratory will contact her to discuss self-pay options or patient pay assistance programs.   PLAN: After considering the risks, benefits, and limitations, Ms. Mccaskey provided informed consent to pursue genetic testing and the blood sample was sent to [facility] for analysis of the CustomNext-Cancer +RNAinsight Panel. Results should be  available within approximately 3 weeks' time, at which point they will be disclosed by telephone to Ms. Huntley, as will any additional recommendations warranted by these results. Ms. Quain will receive a summary of her genetic counseling visit and a copy of her results once available. This information will also be available in Epic.   Lastly, we encouraged Ms. Loscalzo to remain in contact with cancer genetics annually so that we can continuously update the family history and inform her of any changes in cancer genetics and testing that may be of benefit for this family.   Ms. Hartshorn questions were answered to her satisfaction today. Our contact information was provided should additional questions or concerns arise. Thank you for the referral and allowing [place] to share in the care of your patient.   Timmi Devora M. Joette Catching, [place], [facility] Genetic Counselor [email or web address] (P) [phone]  The patient was seen for a total of 20 minutes in face-to-face genetic counseling.  Drs. Magrinat, Lindi Adie and/or Burr Medico were available to discuss this case as needed.    _______________________________________________________________________ For Office Staff:  Number of people involved in session: 1 Was an Intern/ student involved with case: no

## 2021-03-08 ENCOUNTER — Telehealth: Payer: Self-pay | Admitting: Oncology

## 2021-03-08 ENCOUNTER — Encounter: Payer: Self-pay | Admitting: Genetic Counselor

## 2021-03-08 DIAGNOSIS — Z8049 Family history of malignant neoplasm of other genital organs: Secondary | ICD-10-CM

## 2021-03-08 DIAGNOSIS — Z801 Family history of malignant neoplasm of trachea, bronchus and lung: Secondary | ICD-10-CM

## 2021-03-08 HISTORY — DX: Family history of malignant neoplasm of other genital organs: Z80.49

## 2021-03-08 HISTORY — DX: Family history of malignant neoplasm of trachea, bronchus and lung: Z80.1

## 2021-03-08 NOTE — Telephone Encounter (Signed)
Scheduled appt per 3/23 los. Called pt, no answer. Left msg with appt date and time.

## 2021-03-13 ENCOUNTER — Other Ambulatory Visit: Payer: Self-pay

## 2021-03-13 ENCOUNTER — Inpatient Hospital Stay: Payer: No Typology Code available for payment source

## 2021-03-13 ENCOUNTER — Encounter: Payer: Self-pay | Admitting: Oncology

## 2021-03-13 ENCOUNTER — Ambulatory Visit (HOSPITAL_COMMUNITY)
Admission: RE | Admit: 2021-03-13 | Discharge: 2021-03-13 | Disposition: A | Discharge status: Home / Self Care | Payer: No Typology Code available for payment source | Source: Ambulatory Visit | Attending: Oncology | Admitting: Oncology

## 2021-03-13 DIAGNOSIS — Z6839 Body mass index (BMI) 39.0-39.9, adult: Secondary | ICD-10-CM | POA: Diagnosis not present

## 2021-03-13 DIAGNOSIS — N6320 Unspecified lump in the left breast, unspecified quadrant: Secondary | ICD-10-CM

## 2021-03-13 DIAGNOSIS — C50411 Malignant neoplasm of upper-outer quadrant of right female breast: Secondary | ICD-10-CM | POA: Diagnosis present

## 2021-03-13 DIAGNOSIS — R928 Other abnormal and inconclusive findings on diagnostic imaging of breast: Secondary | ICD-10-CM | POA: Diagnosis not present

## 2021-03-13 DIAGNOSIS — Z0189 Encounter for other specified special examinations: Secondary | ICD-10-CM

## 2021-03-13 DIAGNOSIS — I10 Essential (primary) hypertension: Secondary | ICD-10-CM | POA: Diagnosis not present

## 2021-03-13 DIAGNOSIS — Z170 Estrogen receptor positive status [ER+]: Secondary | ICD-10-CM | POA: Diagnosis present

## 2021-03-13 LAB — ECHOCARDIOGRAM COMPLETE
Area-P 1/2: 3.6 cm2
Calc EF: 70.9 %
S' Lateral: 2.4 cm
Single Plane A2C EF: 73.3 %
Single Plane A4C EF: 68.9 %

## 2021-03-13 NOTE — Progress Notes (Signed)
Met with patient at registration to introduce myself as Arboriculturist and to offer available resources.  Discussed one-time $1000 Radio broadcast assistant to assist with personal expenses while going through treatment.  Also, discussed available copay assistance for specific treatment drugs if insurance ded/OOP have not been met. She thinks she has met them.  Gave her my card if interested in applying and for any additional financial questions or concerns.

## 2021-03-13 NOTE — Progress Notes (Signed)
  Echocardiogram 2D Echocardiogram has been performed.  Diane Miranda 03/13/2021, 9:53 AM

## 2021-03-14 ENCOUNTER — Encounter (HOSPITAL_BASED_OUTPATIENT_CLINIC_OR_DEPARTMENT_OTHER): Payer: Self-pay | Admitting: General Surgery

## 2021-03-14 ENCOUNTER — Other Ambulatory Visit: Payer: Self-pay

## 2021-03-14 ENCOUNTER — Ambulatory Visit
Admission: RE | Admit: 2021-03-14 | Discharge: 2021-03-14 | Disposition: A | Discharge status: Home / Self Care | Payer: No Typology Code available for payment source | Source: Ambulatory Visit | Attending: Oncology | Admitting: Oncology

## 2021-03-14 ENCOUNTER — Encounter: Payer: Self-pay | Admitting: Oncology

## 2021-03-14 DIAGNOSIS — Z170 Estrogen receptor positive status [ER+]: Secondary | ICD-10-CM

## 2021-03-14 DIAGNOSIS — C50411 Malignant neoplasm of upper-outer quadrant of right female breast: Secondary | ICD-10-CM

## 2021-03-14 DIAGNOSIS — N6320 Unspecified lump in the left breast, unspecified quadrant: Secondary | ICD-10-CM

## 2021-03-14 DIAGNOSIS — R928 Other abnormal and inconclusive findings on diagnostic imaging of breast: Secondary | ICD-10-CM

## 2021-03-14 MED ORDER — GADOBUTROL 1 MMOL/ML IV SOLN
10.0000 mL | Freq: Once | INTRAVENOUS | Status: AC | PRN
  Administered 2021-03-14: 10 mL via INTRAVENOUS

## 2021-03-14 NOTE — Progress Notes (Signed)
Patient called with concern regarding surgery related cost. Advised patient to contact surgeons office to discuss cost/options. She has a number to return call.  She asked about applying for [employer]. Advised what is needed to apply. She will email it to me and I will meet with her at her next visit 4/7 to complete paperwork.  She has my card for any additional financial questions or concerns. Marland Kitchen

## 2021-03-15 ENCOUNTER — Telehealth: Payer: Self-pay | Admitting: *Deleted

## 2021-03-15 ENCOUNTER — Encounter: Payer: Self-pay | Admitting: Licensed Clinical Social Worker

## 2021-03-15 ENCOUNTER — Encounter: Payer: Self-pay | Admitting: *Deleted

## 2021-03-15 NOTE — Telephone Encounter (Signed)
Spoke with patient to follow up from [facility] and assess navigation needs. Patient is considering doing the dignicap with her treatment and I discussed that with her.  She states she plans on making a decision about that by Monday and will let me know.  We have the extra time to her chemo for now just in case  She is aware of all her appointments and knows to call should have any further questions.

## 2021-03-15 NOTE — Progress Notes (Signed)
[employer]  [employer] representative spoke with patient by phone regarding financial assistance available. She has already spoken with [place] regarding the [employer].  CSW reviewed breast cancer foundations that may be able to help and e-mailed & printed information for patient.  CSW also encouraged patient to look into what her OOP maximum is, so she can be aware of what the max cost may be for her.   She is currently working. Lives with two teenage daughters and MGM.         [place], [place] Worker [employer]

## 2021-03-16 ENCOUNTER — Ambulatory Visit (HOSPITAL_COMMUNITY): Payer: No Typology Code available for payment source

## 2021-03-19 ENCOUNTER — Other Ambulatory Visit (HOSPITAL_COMMUNITY)
Admission: RE | Admit: 2021-03-19 | Discharge: 2021-03-19 | Disposition: A | Discharge status: Home / Self Care | Payer: No Typology Code available for payment source | Source: Ambulatory Visit | Attending: General Surgery | Admitting: General Surgery

## 2021-03-19 ENCOUNTER — Encounter: Payer: Self-pay | Admitting: *Deleted

## 2021-03-19 DIAGNOSIS — Z01812 Encounter for preprocedural laboratory examination: Secondary | ICD-10-CM | POA: Insufficient documentation

## 2021-03-19 DIAGNOSIS — Z20822 Contact with and (suspected) exposure to covid-19: Secondary | ICD-10-CM | POA: Diagnosis not present

## 2021-03-19 LAB — SARS CORONAVIRUS 2 (TAT 6-24 HRS): SARS Coronavirus 2: NEGATIVE

## 2021-03-20 ENCOUNTER — Other Ambulatory Visit: Payer: Self-pay

## 2021-03-20 ENCOUNTER — Encounter: Payer: Self-pay | Admitting: Oncology

## 2021-03-20 ENCOUNTER — Encounter (HOSPITAL_BASED_OUTPATIENT_CLINIC_OR_DEPARTMENT_OTHER)
Admission: RE | Admit: 2021-03-20 | Discharge: 2021-03-20 | Disposition: A | Discharge status: Home / Self Care | Payer: No Typology Code available for payment source | Source: Ambulatory Visit | Attending: General Surgery | Admitting: General Surgery

## 2021-03-20 ENCOUNTER — Ambulatory Visit (HOSPITAL_COMMUNITY)
Admission: RE | Admit: 2021-03-20 | Discharge: 2021-03-20 | Disposition: A | Discharge status: Home / Self Care | Payer: No Typology Code available for payment source | Source: Ambulatory Visit | Attending: Oncology | Admitting: Oncology

## 2021-03-20 ENCOUNTER — Encounter (HOSPITAL_COMMUNITY): Payer: Self-pay

## 2021-03-20 DIAGNOSIS — Z170 Estrogen receptor positive status [ER+]: Secondary | ICD-10-CM | POA: Diagnosis present

## 2021-03-20 DIAGNOSIS — C50411 Malignant neoplasm of upper-outer quadrant of right female breast: Secondary | ICD-10-CM | POA: Insufficient documentation

## 2021-03-20 DIAGNOSIS — R928 Other abnormal and inconclusive findings on diagnostic imaging of breast: Secondary | ICD-10-CM | POA: Insufficient documentation

## 2021-03-20 DIAGNOSIS — Z01810 Encounter for preprocedural cardiovascular examination: Secondary | ICD-10-CM | POA: Insufficient documentation

## 2021-03-20 DIAGNOSIS — N6320 Unspecified lump in the left breast, unspecified quadrant: Secondary | ICD-10-CM | POA: Insufficient documentation

## 2021-03-20 HISTORY — DX: Malignant (primary) neoplasm, unspecified: C80.1

## 2021-03-20 MED ORDER — IOHEXOL 300 MG/ML  SOLN
100.0000 mL | Freq: Once | INTRAMUSCULAR | Status: AC | PRN
  Administered 2021-03-20: 100 mL via INTRAVENOUS

## 2021-03-20 NOTE — Progress Notes (Signed)

## 2021-03-20 NOTE — H&P (Signed)
Diane Miranda Appointment: 03/07/2021 1:00 PM Location: [facility] Patient #: [id number] DOB: 10-23-1976 Undefined / Language: Cleophus Molt / Race: Black or African American Female   History of Present Illness Stark Klein MD; 03/07/2021 3:10 PM) The patient is a 45 year old female who presents with breast cancer. Pt is a 45 yo F referred for consultation by Dr. Enriqueta Shutter for a new right breast cancer. The patient presented with a palpable mass. Diagnostic imaging confirmed this, with a 5.4 cm mass at 9 o'clock, a 1.8 cm abnormal area at 11 o'clock, and 2 abnormal lymph nodes. 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 The 11 o'clock region was benign and concordant. She received a breast MR as well and the dominant mass was 4.7 cm in greatest dimension, 2 nodes, and periareolar skin thickening on the right. There was a 1.1 cm mass on the left at 2-3 o'clock.   She had menarche at age 62. She is a G2P2 with first child in her late teens. She has no close family cancer history. Maternal great aunt did have endometrial cancer. Paternal aunt had lung cancer.   She works as a Engineer, structural at [employer] and labs reviewed today in multidisciplinary fashion.  dx mammo/us 02/15/21 ACR Breast Density Category b: There are scattered areas of fibroglandular density.  FINDINGS: 2D/3D full field views of both breasts demonstrate a large indistinct mass within the posterior OUTER RIGHT breast.  An asymmetry in the RETROAREOLAR RIGHT breast is noted.  At least 2 lymph nodes with cortical thickening are identified within the RIGHT axilla.  No suspicious mammographic findings within the LEFT breast are noted.  On physical exam, a large firm palpable mass identified at the 9 o'clock position of the RIGHT breast 8 cm from the nipple.  Targeted ultrasound of the RIGHT breast is performed, showing  the following:  A 3.7 x 3.2 x 5.4 cm irregular hypoechoic mass at the 9 o'clock position 8 cm from the nipple.  A 0.8 x 0.7 x 1.8 cm irregular hypoechoic area at the 11 o'clock position 3 cm from the nipple.  Two abnormal RIGHT axillary lymph nodes with cortical thickening of up to 0.9 cm.  IMPRESSION: 1. Highly suspicious 5.4 cm mass within the OUTER RIGHT breast and 2 abnormal RIGHT axillary lymph nodes. Tissue sampling of this mass and 1 of the RIGHT axillary lymph nodes is recommended. 2. 1.8 cm indeterminate hypoechoic area within the UPPER-OUTER RIGHT breast. Tissue sampling is recommended. 3. No mammographic evidence of LEFT breast malignancy.  RECOMMENDATION: Ultrasound-guided biopsies of the 5.4 cm OUTER RIGHT breast mass, the 1.8 cm UPPER-OUTER RIGHT breast hypoechoic area and 1 of the enlarged RIGHT axillary lymph nodes. These procedures will be scheduled.  I have discussed the findings and recommendations with the patient. If applicable, a reminder letter will be sent to the patient regarding the next appointment.  BI-RADS CATEGORY 5: Highly suggestive of malignancy.   pathology 02/23/21 1. Breast, right, needle core biopsy, right breast, 11 o'clock - ADENOSIS AND COLUMNAR CELL HYPERPLASIA - NO MALIGNANCY IDENTIFIED 2. Breast, right, needle core biopsy, 9 o'clock - INVASIVE DUCTAL CARCINOMA 3. Breast, right, needle core biopsy, right axilla - INVASIVE DUCTAL CARCINOMA INVOLVING NODAL TISSUE Microscopic Comment 2. Based on the biopsy, the carcinoma appears Nottingham grade 3 of 3 and measures 0.9 cm in greatest linear extent.  breast MR 03/05/21 IMPRESSION: 1. 4.7 x 4.2 x 4.4  cm mass in the 8-9 o'clock position of the posterior right breast represents the recently diagnosed malignancy. 2. Two adjacent abnormal right axillary lymph nodes, 1 of which represents the biopsy-proven metastatic node, the other is also consistent with metastatic  lymphadenopathy. 3. Significant skin thickening in the periareolar right breast, with significant increased T2 signal consistent with edema. There is also mild abnormal enhancement within the thickened skin, which is nonspecific. Consider skin punch biopsy. 4. 1.1 cm, nonspecific enhancing mass in the 2 to 3 o'clock position of the left breast.  RECOMMENDATION: 1. Recommend MRI guided biopsy of the 1.1 cm enhancing mass in the lateral left at 2-3 o'clock.  BI-RADS CATEGORY 4: Suspicious.  The tumor cells are POSITIVE for Her2 (3+). Estrogen Receptor: 40%, POSITIVE, MODERATE STAINING INTENSITY Progesterone Receptor: 60%, POSITIVE, STRONG STAINING INTENSITY Proliferation Marker Ki67: 35%   Past Surgical History Conni Slipper, RN; 03/07/2021 8:15 AM) Breast Biopsy  Right. Foot Surgery  Left. Oral Surgery   Diagnostic Studies History Conni Slipper, RN; 03/07/2021 8:15 AM) Colonoscopy  never Mammogram  within last year Pap Smear  1-5 years ago  Medication History Conni Slipper, RN; 03/07/2021 8:15 AM) Medications Reconciled  Social History Conni Slipper, RN; 03/07/2021 8:15 AM) Alcohol use  Occasional alcohol use. Caffeine use  Coffee, Tea. No drug use  Tobacco use  Never smoker.  Family History Conni Slipper, RN; 03/07/2021 8:15 AM) Arthritis  Family Members In General. Cerebrovascular Accident  Family Members In General. Diabetes Mellitus  Family Members In General, Father. Heart Disease  Family Members In General. Heart disease in female family member before age 55  Hypertension  Family Members In General, Father, Mother. Ischemic Bowel Disease  Brother.  Pregnancy / Birth History Conni Slipper, RN; 03/07/2021 8:15 AM) Age at menarche  65 years. Contraceptive History  Contraceptive implant. Gravida  2 Length (months) of breastfeeding  7-12 Maternal age  24-20 Para  2 Regular periods   Other Problems Conni Slipper, RN; 03/07/2021 8:15 AM) Asthma   Hemorrhoids  High blood pressure  Migraine Headache     Review of Systems Conni Slipper RN; 03/07/2021 8:15 AM) General Present- Fatigue. Not Present- Appetite Loss, Chills, Fever, Night Sweats, Weight Gain and Weight Loss. Skin Not Present- Change in Wart/Mole, Dryness, Hives, Jaundice, New Lesions, Non-Healing Wounds, Rash and Ulcer. HEENT Present- Earache and Seasonal Allergies. Not Present- Hearing Loss, Hoarseness, Nose Bleed, Oral Ulcers, Ringing in the Ears, Sinus Pain, Sore Throat, Visual Disturbances, Wears glasses/contact lenses and Yellow Eyes. Respiratory Not Present- Bloody sputum, Chronic Cough, Difficulty Breathing, Snoring and Wheezing. Breast Present- Breast Mass. Not Present- Breast Pain, Nipple Discharge and Skin Changes. Cardiovascular Present- Rapid Heart Rate. Not Present- Chest Pain, Difficulty Breathing Lying Down, Leg Cramps, Palpitations, Shortness of Breath and Swelling of Extremities. Gastrointestinal Present- Indigestion. Not Present- Abdominal Pain, Bloating, Bloody Stool, Change in Bowel Habits, Chronic diarrhea, Constipation, Difficulty Swallowing, Excessive gas, Gets full quickly at meals, Hemorrhoids, Nausea, Rectal Pain and Vomiting. Female Genitourinary Not Present- Frequency, Nocturia, Painful Urination, Pelvic Pain and Urgency. Musculoskeletal Present- Joint Pain. Not Present- Back Pain, Joint Stiffness, Muscle Pain, Muscle Weakness and Swelling of Extremities. Neurological Not Present- Decreased Memory, Fainting, Headaches, Numbness, Seizures, Tingling, Tremor, Trouble walking and Weakness. Psychiatric Present- Fearful. Not Present- Anxiety, Bipolar, Change in Sleep Pattern, Depression and Frequent crying. Endocrine Not Present- Cold Intolerance, Excessive Hunger, Hair Changes, Heat Intolerance, Hot flashes and New Diabetes. Hematology Not Present- Blood Thinners, Easy Bruising, Excessive bleeding, Gland problems, HIV and Persistent  Infections.  Physical Exam Stark Klein MD; 03/07/2021 3:12 PM) General Mental Status-Alert. General Appearance-Consistent with stated age. Hydration-Well hydrated. Voice-Normal.  Head and Neck Head-normocephalic, atraumatic with no lesions or palpable masses. Trachea-midline. Thyroid Gland Characteristics - normal size and consistency.  Eye Eyeball - Bilateral-Extraocular movements intact. Sclera/Conjunctiva - Bilateral-No scleral icterus.  Chest and Lung Exam Chest and lung exam reveals -quiet, even and easy respiratory effort with no use of accessory muscles and on auscultation, normal breath sounds, no adventitious sounds and normal vocal resonance. Inspection Chest Wall - Normal. Back - normal.  Breast Note: large mobile mass laterally on right 5 cm. it farther out toward axilla. Breast skin of right superior areola is a bit thickened. right axilla has large LN that is palpable and mobile. left breast normal. both breasts are ptotic. no nipple retraction or discharge.   Cardiovascular Cardiovascular examination reveals -normal heart sounds, regular rate and rhythm with no murmurs and normal pedal pulses bilaterally.  Abdomen Inspection Inspection of the abdomen reveals - No Hernias. Palpation/Percussion Palpation and Percussion of the abdomen reveal - Soft, Non Tender, No Rebound tenderness, No Rigidity (guarding) and No hepatosplenomegaly. Auscultation Auscultation of the abdomen reveals - Bowel sounds normal.  Neurologic Neurologic evaluation reveals -alert and oriented x 3 with no impairment of recent or remote memory. Mental Status-Normal.  Musculoskeletal Global Assessment -Note: no gross deformities.  Normal Exam - Left-Upper Extremity Strength Normal and Lower Extremity Strength Normal. Normal Exam - Right-Upper Extremity Strength Normal and Lower Extremity Strength Normal.  Lymphatic Head & Neck  General Head & Neck  Lymphatics: Bilateral - Description - Normal. Axillary  General Axillary Region: Bilateral - Description - Normal. Tenderness - Non Tender. Femoral & Inguinal  Generalized Femoral & Inguinal Lymphatics: Bilateral - Description - No Generalized lymphadenopathy.    Assessment & Plan Stark Klein MD; 03/07/2021 3:13 PM) MALIGNANT NEOPLASM OF UPPER-OUTER QUADRANT OF RIGHT BREAST IN FEMALE, ESTROGEN RECEPTOR POSITIVE (C50.411) Impression: Pt with cT2N1 right breast cancer, triple positive.  Will try downstaging axilla with neoadjuvant chemo.  Pt is a candidate for breast conservation given breast size. Will make final determination regarding surgery once genetics are back. We may be able to do all her surgery from the axilla.  Port discussed with patient. Reviewed risk of bleeding, malposition, pneumothorax, malfunction, possible need to revise port, and more.  I will see the patient back toward the end of chemotherapy. If she is interested in mastectomy, I will send her to see plastic surgery to discuss. She will need adjuvant radiation on the right regardless of surgery and will need antihormone tx post op.  Discussed with patient and her best friend. Current Plans You are being scheduled for surgery- Our schedulers will call you.  You should hear from our office's scheduling department within 5 working days about the location, date, and time of surgery. We try to make accommodations for patient's preferences in scheduling surgery, but sometimes the OR schedule or the surgeon's schedule prevents [place] from making those accommodations.  If you have not heard from our office [phone]) in 5 working days, call the office and ask for your surgeon's nurse.  If you have other questions about your diagnosis, plan, or surgery, call the office and ask for your surgeon's nurse.  Pt Education - ccs port insertion education ABNORMAL MAGNETIC RESONANCE IMAGING OF LEFT BREAST (R92.8) Impression:  Will need MR biopsy of left breast. If cancer, would be candidate for breast conservation on that side.

## 2021-03-20 NOTE — Progress Notes (Signed)
Met with patient at registration to complete grant process.  Patient approved for one-time $1000 [employer] grant to assist with personal expenses while going through treatment.Discussed in detail expenses and how they are covered.  Patient submitted an expense which will close her grant.  She has a copy of the approval letter and expense sheet along with the Outpatient pharmacy information.   She has my card for any additional financial questions or concerns.

## 2021-03-21 ENCOUNTER — Encounter: Payer: Self-pay | Admitting: *Deleted

## 2021-03-21 ENCOUNTER — Telehealth: Payer: Self-pay | Admitting: *Deleted

## 2021-03-21 NOTE — Progress Notes (Signed)
Pharmacist Chemotherapy Monitoring - Initial Assessment    Anticipated start date: 03/28/2021  Regimen:  . Are orders appropriate based on the patient's diagnosis, regimen, and cycle? Yes . Does the plan date match the patient's scheduled date? Yes . Is the sequencing of drugs appropriate? Yes . Are the premedications appropriate for the patient's regimen? Yes . Prior Authorization for treatment is: Pending o If applicable, is the correct biosimilar selected based on the patient's insurance? yes  Organ Function and Labs: Marland Kitchen Are dose adjustments needed based on the patient's renal function, hepatic function, or hematologic function? Yes . Are appropriate labs ordered prior to the start of patient's treatment? Yes . Other organ system assessment, if indicated: trastuzumab: Echo/ MUGA . The following baseline labs, if indicated, have been ordered: N/A  Dose Assessment: . Are the drug doses appropriate? Yes .  Marland Kitchen Are the following correct: o Drug concentrations Yes o IV fluid compatible with drug Yes o Administration routes Yes o Timing of therapy Yes . If applicable, does the patient have documented access for treatment and/or plans for port-a-cath placement? no . If applicable, have lifetime cumulative doses been properly documented and assessed? yes Lifetime Dose Tracking  No doses have been documented on this patient for the following tracked chemicals: Doxorubicin, Epirubicin, Idarubicin, Daunorubicin, Mitoxantrone, Bleomycin, Oxaliplatin, Carboplatin, Liposomal Doxorubicin  o   Toxicity Monitoring/Prevention: . The patient has the following take home antiemetics prescribed: Ondansetron, Prochlorperazine, Dexamethasone and Lorazepam . The patient has the following take home medications prescribed: N/A . Medication allergies and previous infusion related reactions, if applicable, have been reviewed and addressed. No . The patient's current medication list has been assessed for  drug-drug interactions with their chemotherapy regimen. no significant drug-drug interactions were identified on review.  Order Review: . Are the treatment plan orders signed? no . Is the patient scheduled to see a provider prior to their treatment? Yes  I verify that I have reviewed each item in the above checklist and answered each question accordingly.  Larene Beach, [place], 03/21/2021  12:07 PM

## 2021-03-21 NOTE — Telephone Encounter (Signed)
Spoke with patient. She tells me she has decided not to do the dignicap.  I have let our infusion room schedulers know that she is not going to do this.

## 2021-03-21 NOTE — Progress Notes (Signed)
[place]  [phone] Fax:([phone]     ID: Diane Miranda DOB: 28-Nov-1976  MR#: [id number]  GYI#:[id number]  Patient Care Team: [name], MD as PCP - General (Internal Medicine) Mauro Kaufmann, RN as Oncology Nurse Navigator Rockwell Germany, RN as Oncology Nurse Navigator Stark Klein, MD as Consulting Physician (General Surgery) Jaleya Pebley, Virgie Dad, MD as Consulting Physician (Oncology) Gery Pray, MD as Consulting Physician (Radiation Oncology) Wylene Simmer, MD as Consulting Physician (Orthopedic Surgery) Chauncey Cruel, MD OTHER MD:  CHIEF COMPLAINT: triple positive breast cancer  CURRENT TREATMENT: Neoadjuvant chemotherapy   INTERVAL HISTORY: [name] returns today for follow up of her triple positive right breast cancer. She was evaluated in the multidisciplinary breast cancer clinic on 03/07/2021.  She is accompanied by her significant other Tim.  She underwent genetic testing during clinic. Results are pending.  Since consultation, she underwent echocardiogram on 03/13/2021 showing an ejection fraction of 60-65%.  She also underwent left breast biopsy on 03/14/2021. Pathology [id number]) showed: adenosis, including focal sclerosing adenosis; fibrocystic change.  She underwent an additional right breast biopsy today 03/22/2021, with results pending  She also underwent CT chest/abdomen/pelvis on 03/20/2021 showing: 4.4 cm lobular enhancing biopsy-proven right breast malignancy; enlarged 1.7 cm lobular right axillary lymph nodes; nonspecific 2.5 cm cystic mass in head of pancreas, incompletely evaluated; hyperdense 4 mm lesion in right lobe of liver, too small to accurately characterize; IUD in place with lobular uterine contour commonly related to uterine leiomyomas.  Finally, she underwent port placement earlier today.  She tolerated that procedure without unusual bleeding or pain.  She is scheduled for bone scan  tomorrow, 03/23/2021 and to start chemo immunotherapy 03/28/2021   REVIEW OF SYSTEMS: Nikkole has done a very good job of understanding of her situation and she greatly profit from meeting with our chemotherapy teaching nurse.  She knows exactly what to do and when to do it as far as supportive care is concerned.  She has all her medication on hand already.  She was appropriately concerned with similar findings and the CT scan and those are discussed below.  She is waiting on bone scan tomorrow.  A detailed review of systems today was otherwise stable.   COVID 19 VACCINATION STATUS: refuses vaccination; infection 10/2019   HISTORY OF CURRENT ILLNESS: From the original intake note:  [name] herself palpated a right breast mass and noted associated pain. She underwent bilateral diagnostic mammography with tomography and right breast ultrasonography at [facility] on 02/15/2021 showing: breast density category B; palpable 5.4 cm mass within outer right breast; two abnormal right axillary lymph nodes; 1.8 cm indeterminate hypoechoic area within upper-outer right breast; no evidence of left breast malignancy.  Accordingly on 02/23/2021 she proceeded to biopsy of the right breast areas in question. The pathology from this procedure ([id number]) showed:  1. Right Breast, 11 o'clock  - adenosis and columnar cell hyperplasia; this was concordant 2. Right Breast, 9 o'clock  - invasive ductal carcinoma, grade 3  - Prognostic indicators significant for: estrogen receptor, 40% positive with moderate staining intensity and progesterone receptor, 60% positive with strong staining intensity. Proliferation marker Ki67 at 35%. HER2 positive by immunohistochemistry (3+). 3. Right Axilla, lymph node  - invasive ductal carcinoma involving nodal tissue  She underwent breast MRI on 03/04/2021 showing: breast composition B; 4.7 cm mass in 8-9 o'clock position of posterior right breast represents recently  diagnosed malignancy; two adjacent abnormal right axillary lymph nodes,  one of which represents the biopsy-proven metastatic node; significant skin thickening in periareolar right breast, with significant increased T2 signal consistent with edema; mild abnormal enhancement within thickened skin, nonspecific.  There was also a 1.1 cm nonspecific enhancing mass in left breast at 2-3 o'clock.  Cancer Staging Malignant neoplasm of upper-outer quadrant of right breast in female, estrogen receptor positive ([place]) Staging form: Breast, AJCC 8th Edition - Clinical: Stage IIB (cT3, cN1, cM0, G3, ER+, PR+, HER2+) - Signed by Chauncey Cruel, MD on 03/07/2021 Histologic grading system: 3 grade system  The patient's subsequent history is as detailed below.   PAST MEDICAL HISTORY: Past Medical History:  Diagnosis Date  . Allergy 2000  . Asthma    as child  . Family history of lung cancer 03/08/2021  . Family history of uterine cancer 03/08/2021  . Headache   . Hypertension   . Prediabetes   . right breast ca dx'd 2021/04/03  . Trimalleolar fracture of ankle, closed, left, initial encounter     PAST SURGICAL HISTORY: Past Surgical History:  Procedure Laterality Date  . FRACTURE SURGERY  2018  . NO PAST SURGERIES    . ORIF ANKLE FRACTURE Left 07/10/2017   Procedure: OPEN REDUCTION INTERNAL FIXATION (ORIF) trimallolar ANKLE FRACTURE;  Surgeon: Wylene Simmer, MD;  Location: [facility];  Service: Orthopedics;  Laterality: Left;    FAMILY HISTORY: Family History  Problem Relation Age of Onset  . Diabetes Father   . Hypertension Father   . Early death Mother   . Heart disease Mother   . Hypertension Mother   . Hearing loss Maternal Grandmother   . Hypertension Maternal Grandmother   . Uterine cancer Other 4       MGM's sister  . Throat cancer Other        MGM's brother; dx after 37  . Lung cancer Other        PGM's sister; dx 16s   Her father is living at age 42 as of  03-Apr-2021. Her mother died at age 57 from [place]. Desree has two brothers (and no sisters). She reports endometrial cancer in a maternal great aunt and lung cancer in a paternal aunt. There is no other family history of cancer to her knowledge.   GYNECOLOGIC HISTORY:  Patient's last menstrual period was 04-03-21. Menarche: 45 years old Age at first live birth: 45 years old [place] P 2 LMP early 04-03-21 (has since had IUD placed) Contraceptive: IUD in place [place]), previously used pills from 1996-2006 HRT n/a  Hysterectomy? no BSO? 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 She is single. She lives at home with daughter Joylene Draft (age 73), adopted daughter Hetty Blend (age 28), and the patient's maternal grandmother, who has significant dementia issues. Daughter Levy Pupa, age 33, works in Press photographer in [place]. Yarnell is a Restaurant manager, fast food.    ADVANCED DIRECTIVES: not in place; however the patient made it clear during her 03/07/2021 visit that she would not accept blood product transfusions for any reason including immediately life saving reasons  HEALTH MAINTENANCE: Social History   Tobacco Use  . Smoking status: Never Smoker  . Smokeless tobacco: Never Used  Vaping Use  . Vaping Use: Never used  Substance Use Topics  . Alcohol use: Yes    Alcohol/week: 0.0 standard drinks    Comment: social  . Drug use: No     Colonoscopy: n/a (age)  PAP: 2020  Bone density: n/a (age)   No Known Allergies  Current Outpatient Medications  Medication Sig Dispense Refill  . amLODipine (NORVASC) 10 MG tablet Take 10 mg by mouth daily.    Marland Kitchen Fexofenadine HCl (ALLEGRA ALLERGY PO) Take by mouth.    . Multiple Vitamins-Minerals (MULTIVITAMIN WITH MINERALS) tablet Take 1 tablet by mouth daily.    Marland Kitchen oxyCODONE (OXY IR/ROXICODONE) 5 MG immediate release tablet Take 1 tablet (5 mg total) by mouth every 6 (six) hours as needed for  severe pain. 5 tablet 0  . Turmeric (QC TUMERIC COMPLEX PO) Take by mouth.     No current facility-administered medications for this visit.    OBJECTIVE: African-American woman who appears stated age  89:   03/22/21 1603  BP: 119/61  Pulse: (!) 129  Resp: 18  Temp: 97.7 F (36.5 C)  SpO2: 99%     Body mass index is 42.9 kg/m.   Wt Readings from Last 3 Encounters:  03/22/21 249 lb 14.4 oz (113.4 kg)  03/22/21 238 lb 8.6 oz (108.2 kg)  03/07/21 239 lb 6.4 oz (108.6 kg)      ECOG FS:1 - Symptomatic but completely ambulatory  Sclerae unicteric, EOMs intact Wearing a mask No cervical or supraclavicular adenopathy Lungs no rales or rhonchi Heart regular rate and rhythm Abd soft, nontender, positive bowel sounds MSK no focal spinal tenderness, no upper extremity lymphedema Neuro: nonfocal, well oriented, appropriate affect Breasts: The right breast is status post a recent biopsy.  There is a bandage in place.  The mass in the outer quadrant is movable and palpable although difficult to delineate.  There is no skin or nipple involvement.  The left breast is benign.   LAB RESULTS:  CMP     Component Value Date/Time   NA 142 03/07/2021 1219   K 3.6 03/07/2021 1219   CL 106 03/07/2021 1219   CO2 26 03/07/2021 1219   GLUCOSE 122 (H) 03/07/2021 1219   GLUCOSE 105 (H) 11/12/2006 1022   BUN 8 03/07/2021 1219   CREATININE 0.92 03/07/2021 1219   CALCIUM 8.7 (L) 03/07/2021 1219   PROT 7.0 03/07/2021 1219   ALBUMIN 3.8 03/07/2021 1219   AST 13 (L) 03/07/2021 1219   ALT 16 03/07/2021 1219   ALKPHOS 63 03/07/2021 1219   BILITOT 0.6 03/07/2021 1219   GFRNONAA >60 03/07/2021 1219   GFRAA >90 01/30/2015 1319    No results found for: TOTALPROTELP, ALBUMINELP, A1GS, A2GS, BETS, BETA2SER, GAMS, MSPIKE, SPEI  Lab Results  Component Value Date   WBC 11.9 (H) 03/07/2021   NEUTROABS 9.0 (H) 03/07/2021   HGB 13.4 03/07/2021   HCT 39.8 03/07/2021   MCV 88.8 03/07/2021   PLT  330 03/07/2021    No results found for: LABCA2  No components found for: [id number]  No results for input(s): INR in the last 168 hours.  No results found for: LABCA2  No results found for: CBJ628  No results found for: BTD176  No results found for: HYW737  No results found for: CA2729  No components found for: HGQUANT  No results found for: CEA1 / No results found for: CEA1   No results found for: AFPTUMOR  No results found for: CHROMOGRNA  No results found for: KPAFRELGTCHN, LAMBDASER, KAPLAMBRATIO (kappa/lambda light chains)  No results found for: HGBA, HGBA2QUANT, HGBFQUANT, HGBSQUAN (Hemoglobinopathy evaluation)   No results found for: LDH  No results found for: IRON, TIBC, IRONPCTSAT (Iron and TIBC)  No results found  for: FERRITIN  Urinalysis    Component Value Date/Time   COLORURINE YELLOW 01/30/2015 [place] 01/30/2015 1343   LABSPEC 1.011 01/30/2015 1343   PHURINE 7.5 01/30/2015 1343   GLUCOSEU NEGATIVE 01/30/2015 1343   HGBUR NEGATIVE 01/30/2015 1343   BILIRUBINUR NEGATIVE 01/30/2015 1343   KETONESUR NEGATIVE 01/30/2015 1343   PROTEINUR NEGATIVE 01/30/2015 1343   UROBILINOGEN 0.2 01/30/2015 1343   NITRITE NEGATIVE 01/30/2015 1343   LEUKOCYTESUR SMALL (A) 01/30/2015 1343     STUDIES: CT Chest W Contrast  Result Date: 03/21/2021 CLINICAL DATA:  Pretreatment staging for right breast cancer with right axillary lymph node involvement EXAM: CT CHEST, ABDOMEN, AND PELVIS WITH CONTRAST TECHNIQUE: Multidetector CT imaging of the chest, abdomen and pelvis was performed following the standard protocol during bolus administration of intravenous contrast. CONTRAST:  169m OMNIPAQUE IOHEXOL 300 MG/ML  SOLN COMPARISON:  None. FINDINGS: CT CHEST FINDINGS Cardiovascular: No thoracic aortic aneurysm. No central pulmonary embolus. Normal size heart. No significant pericardial effusion/thickening. Mediastinum/Nodes: Enlarged lobular RIGHT axillary  lymph nodes measuring up to 1.7 cm on image 19/2. No LEFT axillary adenopathy Lungs/Pleura: No suspicious pulmonary nodules or masses. No pleural effusion. No pneumothorax. Musculoskeletal: Lobular enhancing 4.4 x 3.7 cm RIGHT breast mass consistent with known diagnosis of breast cancer. Oval focus of gas and biopsy marker in the LEFT breast, sequela of recent vacuum assisted biopsy. No aggressive lytic or blastic lesions of bone. CT ABDOMEN PELVIS FINDINGS Hepatobiliary: Hyperdense 4 mm lesion in the right lobe of the liver on image 54/2 which is too small to accurately characterize. Gallbladder is unremarkable. No biliary ductal dilation. Pancreas: Cystic 1.7 x 1.7 x 2.5 cm mass in the head of the pancreas on image 70/2 and 65/4. No pancreatic ductal dilation. No evidence of acute inflammation. Spleen: Normal in size without focal abnormality. Adrenals/Urinary Tract: Adrenal glands are unremarkable. Kidneys are normal, without renal calculi, focal lesion, or hydronephrosis. Bladder is unremarkable for degree of distension. Stomach/Bowel: Radiopaque orally ingested contrast traverses the rectum. Stomach is within normal limits. Appendix appears normal. No evidence of bowel wall thickening, distention, or inflammatory changes. Vascular/Lymphatic: No significant vascular findings are present. No enlarged abdominal or pelvic lymph nodes. Reproductive: Intrauterine device in place. Lobular uterine contour commonly related to uterine leiomyomas. No suspicious adnexal masses. Other: No abdominopelvic ascites. Musculoskeletal: No acute or significant osseous findings. IMPRESSION: 1. Lobular enhancing 4.4 cm RIGHT breast mass consistent with known diagnosis of breast cancer. 2. Enlarged lobular RIGHT axillary lymph nodes measuring up to 1.7 cm, compatible with disease involvement. 3. Cystic 1.7 x 1.7 x 2.5 cm mass in the head of the pancreas, nonspecific and incompletely evaluated, recommend further characterization with  pancreas protocol MRI with and without contrast. 4. Hyperdense 4 mm lesion in the right lobe of the liver which is too small to accurately characterize. Attention on follow-up studies is recommended. 5. Intrauterine device in place with lobular uterine contour commonly related to uterine leiomyomas. These results will be called to the ordering clinician or representative by the Radiologist Assistant, and communication documented in the PACS or [employer] Electronically Signed   By: JDahlia BailiffMD   On: 03/21/2021 08:47   CT Abdomen Pelvis W Contrast  Result Date: 03/21/2021 CLINICAL DATA:  Pretreatment staging for right breast cancer with right axillary lymph node involvement EXAM: CT CHEST, ABDOMEN, AND PELVIS WITH CONTRAST TECHNIQUE: Multidetector CT imaging of the chest, abdomen and pelvis was performed following the standard protocol during bolus administration  of intravenous contrast. CONTRAST:  175m OMNIPAQUE IOHEXOL 300 MG/ML  SOLN COMPARISON:  None. FINDINGS: CT CHEST FINDINGS Cardiovascular: No thoracic aortic aneurysm. No central pulmonary embolus. Normal size heart. No significant pericardial effusion/thickening. Mediastinum/Nodes: Enlarged lobular RIGHT axillary lymph nodes measuring up to 1.7 cm on image 19/2. No LEFT axillary adenopathy Lungs/Pleura: No suspicious pulmonary nodules or masses. No pleural effusion. No pneumothorax. Musculoskeletal: Lobular enhancing 4.4 x 3.7 cm RIGHT breast mass consistent with known diagnosis of breast cancer. Oval focus of gas and biopsy marker in the LEFT breast, sequela of recent vacuum assisted biopsy. No aggressive lytic or blastic lesions of bone. CT ABDOMEN PELVIS FINDINGS Hepatobiliary: Hyperdense 4 mm lesion in the right lobe of the liver on image 54/2 which is too small to accurately characterize. Gallbladder is unremarkable. No biliary ductal dilation. Pancreas: Cystic 1.7 x 1.7 x 2.5 cm mass in the head of the pancreas on image 70/2 and 65/4.  No pancreatic ductal dilation. No evidence of acute inflammation. Spleen: Normal in size without focal abnormality. Adrenals/Urinary Tract: Adrenal glands are unremarkable. Kidneys are normal, without renal calculi, focal lesion, or hydronephrosis. Bladder is unremarkable for degree of distension. Stomach/Bowel: Radiopaque orally ingested contrast traverses the rectum. Stomach is within normal limits. Appendix appears normal. No evidence of bowel wall thickening, distention, or inflammatory changes. Vascular/Lymphatic: No significant vascular findings are present. No enlarged abdominal or pelvic lymph nodes. Reproductive: Intrauterine device in place. Lobular uterine contour commonly related to uterine leiomyomas. No suspicious adnexal masses. Other: No abdominopelvic ascites. Musculoskeletal: No acute or significant osseous findings. IMPRESSION: 1. Lobular enhancing 4.4 cm RIGHT breast mass consistent with known diagnosis of breast cancer. 2. Enlarged lobular RIGHT axillary lymph nodes measuring up to 1.7 cm, compatible with disease involvement. 3. Cystic 1.7 x 1.7 x 2.5 cm mass in the head of the pancreas, nonspecific and incompletely evaluated, recommend further characterization with pancreas protocol MRI with and without contrast. 4. Hyperdense 4 mm lesion in the right lobe of the liver which is too small to accurately characterize. Attention on follow-up studies is recommended. 5. Intrauterine device in place with lobular uterine contour commonly related to uterine leiomyomas. These results will be called to the ordering clinician or representative by the Radiologist Assistant, and communication documented in the PACS or [employer] Electronically Signed   By: JDahlia BailiffMD   On: 03/21/2021 08:47   MR BREAST BILATERAL W WO CONTRAST INC CAD  Result Date: 03/05/2021 CLINICAL DATA:  Recent diagnosis invasive ductal carcinoma of the right breast at 9 o'clock as well as metastatic right axillary  lymphadenopathy. Patient also underwent a biopsy of an abnormality in the anterior right breast at 11 o'clock, which revealed adenosis and columnar cell hyperplasia without malignancy or atypia. Current exam is to assess extent of disease. LABS:  No labs drawn at time of imaging. EXAM: BILATERAL BREAST MRI WITH AND WITHOUT CONTRAST TECHNIQUE: Multiplanar, multisequence MR images of both breasts were obtained prior to and following the intravenous administration of 10 ml of Gadavist Three-dimensional MR images were rendered by post-processing of the original MR data on an independent workstation. The three-dimensional MR images were interpreted, and findings are reported in the following complete MRI report for this study. Three dimensional images were evaluated at the independent interpreting workstation using the DynaCAD thin client. COMPARISON:  Previous exam(s). FINDINGS: Breast composition: b. Scattered fibroglandular tissue. Background parenchymal enhancement: Marked Right breast: There is an enhancing mass in the right breast laterally measuring 4.7 x 4.2  x 4.4 cm. The mass lies posteriorly, projecting at 8-9 o'clock. Post biopsy clip artifact lies along the mass's superior margin. There is non mass-like enhancement along the lateral margin, which likely reflects post biopsy change. There are no other masses. Post biopsy clip artifact is also noted in the anterior, upper outer right breast from the recent benign biopsy. There is significant periareolar skin thickening, which shows increased T2 signal consistent with edema, but also demonstrates mild enhancement. Left breast: There is a small enhancing mass in the upper outer quadrant between 2 and 3 o'clock, middle depth, measuring 1.1 x 0.5 x 0.8 cm. This mass shows some washout kinetics, but is predominantly plateau type kinetics. There are no other left breast masses or areas of abnormal enhancement. Lymph nodes: 2 adjacent enlarged lymph nodes are noted in  the right axilla with maximal cortical thickness is measured at 18 and 12 mm. No other enlarged or abnormal appearing lymph nodes on the right. No abnormal lymph nodes on the left. Ancillary findings:  None. IMPRESSION: 1. 4.7 x 4.2 x 4.4 cm mass in the 8-9 o'clock position of the posterior right breast represents the recently diagnosed malignancy. 2. Two adjacent abnormal right axillary lymph nodes, 1 of which represents the biopsy-proven metastatic node, the other is also consistent with metastatic lymphadenopathy. 3. Significant skin thickening in the periareolar right breast, with significant increased T2 signal consistent with edema. There is also mild abnormal enhancement within the thickened skin, which is nonspecific. Consider skin punch biopsy. 4. 1.1 cm, nonspecific enhancing mass in the 2 to 3 o'clock position of the left breast. RECOMMENDATION: 1. Recommend MRI guided biopsy of the 1.1 cm enhancing mass in the lateral left at 2-3 o'clock. BI-RADS CATEGORY  4: Suspicious. Electronically Signed   By: Lajean Manes M.D.   On: 03/05/2021 08:55   DG CHEST PORT 1 VIEW  Result Date: 03/22/2021 CLINICAL DATA:  45 year old female status post Port-A-Cath placement. EXAM: PORTABLE CHEST - 1 VIEW COMPARISON:  None. FINDINGS: The mediastinal contours are within normal limits. No cardiomegaly. Left subclavian port in place with catheter tip terminating at the cavoatrial junction. The lungs are clear bilaterally without evidence of focal consolidation, pleural effusion, or pneumothorax. No acute osseous abnormality. IMPRESSION: Status post left subclavian Port-A-Cath placement without evidence of pneumothorax. Electronically Signed   By: Ruthann Cancer MD   On: 03/22/2021 09:52   DG Fluoro Guide CV Line-No Report  Result Date: 03/22/2021 Fluoroscopy was utilized by the requesting physician.  No radiographic interpretation.   ECHOCARDIOGRAM COMPLETE  Result Date: 03/13/2021    ECHOCARDIOGRAM REPORT   Patient  Name:   MAIZY DAVANZO Date of Exam: 03/13/2021 Medical Rec #:  [id number]           Height:       64.0 in Accession #:    [id number]          Weight:       239.4 lb Date of Birth:  1976/01/22           BSA:          2.112 m Patient Age:    78 years            BP:           157/83 mmHg Patient Gender: F                   HR:           96 bpm. Exam  Location:  Outpatient Procedure: 2D Echo, 3D Echo, Cardiac Doppler, Color Doppler and Strain Analysis Indications:    Z51.11 Encounter for antineoplastic chemotheraphy  History:        Patient has no prior history of Echocardiogram examinations.                 Risk Factors:Hypertension. Breast cancer.  Sonographer:    Roseanna Rainbow Referring Phys: [phone] Isaura Schiller C Zoi Devine  Sonographer Comments: Technically difficult study due to poor echo windows, patient is morbidly obese and suboptimal parasternal window. Image acquisition challenging due to patient body habitus. Global longitudinal strain was attempted. IMPRESSIONS  1. Left ventricular ejection fraction, by estimation, is 60 to 65%. The left ventricle has normal function. The left ventricle has no regional wall motion abnormalities. Left ventricular diastolic parameters were normal. The average left ventricular global longitudinal strain is -24.7 %. The global longitudinal strain is normal.  2. Right ventricular systolic function is normal. The right ventricular size is normal.  3. The mitral valve is normal in structure. No evidence of mitral valve regurgitation. No evidence of mitral stenosis.  4. The aortic valve is normal in structure. Aortic valve regurgitation is not visualized. No aortic stenosis is present.  5. The inferior vena cava is normal in size with greater than 50% respiratory variability, suggesting right atrial pressure of 3 mmHg. FINDINGS  Left Ventricle: Left ventricular ejection fraction, by estimation, is 60 to 65%. The left ventricle has normal function. The left ventricle has no regional wall  motion abnormalities. The average left ventricular global longitudinal strain is -24.7 %. The global longitudinal strain is normal. The left ventricular internal cavity size was normal in size. There is no left ventricular hypertrophy. Left ventricular diastolic parameters were normal. Right Ventricle: The right ventricular size is normal. No increase in right ventricular wall thickness. Right ventricular systolic function is normal. Left Atrium: Left atrial size was normal in size. Right Atrium: Right atrial size was normal in size. Pericardium: There is no evidence of pericardial effusion. Mitral Valve: The mitral valve is normal in structure. No evidence of mitral valve regurgitation. No evidence of mitral valve stenosis. Tricuspid Valve: The tricuspid valve is normal in structure. Tricuspid valve regurgitation is not demonstrated. No evidence of tricuspid stenosis. Aortic Valve: The aortic valve is normal in structure. Aortic valve regurgitation is not visualized. No aortic stenosis is present. Pulmonic Valve: The pulmonic valve was normal in structure. Pulmonic valve regurgitation is not visualized. No evidence of pulmonic stenosis. Aorta: The aortic root is normal in size and structure. Venous: The inferior vena cava is normal in size with greater than 50% respiratory variability, suggesting right atrial pressure of 3 mmHg. IAS/Shunts: No atrial level shunt detected by color flow Doppler.  LEFT VENTRICLE PLAX 2D LVIDd:         3.60 cm     Diastology LVIDs:         2.40 cm     LV e' medial:    9.03 cm/s LV PW:         1.10 cm     LV E/e' medial:  10.6 LV IVS:        1.20 cm     LV e' lateral:   14.50 cm/s LVOT diam:     2.10 cm     LV E/e' lateral: 6.6 LV SV:         92 LV SV Index:   44  2D Longitudinal Strain LVOT Area:     3.46 cm    2D Strain GLS Avg:     -24.7 %  LV Volumes (MOD) LV vol d, MOD A2C: 39.0 ml 3D Volume EF: LV vol d, MOD A4C: 52.4 ml 3D EF:        70 % LV vol s, MOD A2C: 10.4 ml LV  EDV:       125 ml LV vol s, MOD A4C: 16.3 ml LV ESV:       37 ml LV SV MOD A2C:     28.6 ml LV SV:        87 ml LV SV MOD A4C:     52.4 ml LV SV MOD BP:      32.5 ml RIGHT VENTRICLE             IVC RV S prime:     15.20 cm/s  IVC diam: 1.40 cm TAPSE (M-mode): 2.7 cm LEFT ATRIUM             Index       RIGHT ATRIUM           Index LA diam:        3.10 cm 1.47 cm/m  RA Area:     12.50 cm LA Vol (A2C):   21.4 ml 10.13 ml/m RA Volume:   30.60 ml  14.49 ml/m LA Vol (A4C):   27.0 ml 12.79 ml/m LA Biplane Vol: 24.3 ml 11.51 ml/m  AORTIC VALVE LVOT Vmax:   144.00 cm/s LVOT Vmean:  94.300 cm/s LVOT VTI:    0.267 m  AORTA Ao Root diam: 3.00 cm MITRAL VALVE MV Area (PHT): 3.60 cm    SHUNTS MV Decel Time: 211 msec    Systemic VTI:  0.27 m MV E velocity: 95.60 cm/s  Systemic Diam: 2.10 cm MV A velocity: 84.40 cm/s MV E/A ratio:  1.13 Mihai Croitoru MD Electronically signed by Sanda Klein MD Signature Date/Time: 03/13/2021/1:17:39 PM    Final    [place] AXILLARY NODE CORE BIOPSY RIGHT  Addendum Date: 02/26/2021   ADDENDUM REPORT: 02/26/2021 15:04 ADDENDUM: 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 This was found to be concordant by Dr. Franki Cabot. 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 This was found to be concordant by Dr. Franki Cabot. Pathology revealed INVASIVE DUCTAL CARCINOMA INVOLVING NODAL TISSUE of the Right axilla. This was found to be concordant by Dr. Franki Cabot. Pathology results were discussed with the patient by telephone. The patient reported doing well after the biopsies with tenderness at the sites. Post biopsy instructions and care were reviewed and questions were answered. The patient was encouraged to call The [place] for any additional concerns. My direct phone number was provided. The patient was referred to [facility] on March 07, 2021. Pathology results reported by Terie Purser, RN on 02/26/2021. Electronically Signed   By: Franki Cabot M.D.   On: 02/26/2021 15:04   Result Date: 02/26/2021 CLINICAL DATA:  Patient with suspicious masses within the RIGHT breast at the 11 o'clock and 9 o'clock axes, presenting today for ultrasound-guided core biopsies. Patient also with suspicious enlarged/morphologically abnormal lymph nodes in the RIGHT axilla, presenting today for ultrasound-guided core biopsy of 1 of these abnormal-appearing lymph nodes. EXAM: ULTRASOUND GUIDED RIGHT BREAST CORE NEEDLE BIOPSY x2 ULTRASOUND-GUIDED RIGHT AXILLARY  LYMPH NODE CORE NEEDLE BIOPSY COMPARISON:  Previous exam(s). PROCEDURE: I met with the patient and we discussed the procedure of ultrasound-guided biopsy, including benefits and alternatives. We discussed the high likelihood of a successful procedure. We discussed the risks of the procedure, including infection, bleeding, tissue injury, clip migration, and inadequate sampling. Informed written consent was given. The usual time-out protocol was performed immediately prior to the procedure. Site 1: Lesion quadrant: Upper outer quadrant Using sterile technique and 1% Lidocaine as local anesthetic, under direct ultrasound visualization, a 12 gauge spring-loaded device was used to perform biopsy of the hypoechoic mass in the RIGHT breast at the 11 o'clock axis using a medial approach. At the conclusion of the procedure coil shaped tissue marker clip was deployed into the biopsy cavity. Site 2: Lesion quadrant: Upper outer quadrant Using sterile technique and 1% Lidocaine as local anesthetic, under direct ultrasound visualization, a 12 gauge spring-loaded device was used to perform biopsy of the RIGHT breast mass at the 9 o'clock axis using a lateral approach. At the conclusion of the procedure heart shaped tissue marker clip was deployed into the biopsy cavity. Site 3: Lesion  quadrant: RIGHT axilla Using sterile technique and 1% Lidocaine as local anesthetic, under direct ultrasound visualization, a 14 gauge spring-loaded device was used to perform biopsy of 1 of the enlarged/morphologically abnormal lymph nodes in the RIGHT axilla using a lateral approach. At the conclusion of the procedure tribell tissue marker clip was deployed into the biopsy cavity. Follow up 2 view mammogram was performed and dictated separately. IMPRESSION: 1. Ultrasound guided biopsy of the RIGHT breast mass at the 11 o'clock axis. No apparent complications. 2. Ultrasound guided biopsy of the RIGHT breast mass at the 9 o'clock axis. No apparent complications. 3. Ultrasound guided biopsy of 1 of the enlarged lymph nodes in the RIGHT axilla. No apparent complications. Electronically Signed: By: Franki Cabot M.D. On: 02/23/2021 13:48   MM CLIP PLACEMENT LEFT  Result Date: 03/14/2021 CLINICAL DATA:  Evaluate BARBELL clip placement following MR guided biopsy of 1.1 cm UPPER-OUTER LEFT breast mass. EXAM: DIAGNOSTIC LEFT MAMMOGRAM POST MRI BIOPSY COMPARISON:  Previous exam(s). FINDINGS: Mammographic images were obtained following MR guided biopsy of the 1.1 cm UPPER-OUTER LEFT breast mass. The BARBELL biopsy marking clip is in expected position at the site of biopsy. IMPRESSION: Appropriate positioning of the BARBELL shaped biopsy marking clip at the site of biopsy in the UPPER OUTER LEFT breast. Final Assessment: Post Procedure Mammograms for Marker Placement Electronically Signed   By: Margarette Canada M.D.   On: 03/14/2021 10:22   MM CLIP PLACEMENT RIGHT  Result Date: 02/23/2021 CLINICAL DATA:  Status post ultrasound-guided biopsies for suspicious masses in the RIGHT breast at the 9 o'clock and 11 o'clock axes. Also status post ultrasound-guided biopsy of an enlarged lymph node in the RIGHT axilla. EXAM: DIAGNOSTIC RIGHT MAMMOGRAM POST ULTRASOUND BIOPSY x3 COMPARISON:  Previous exam(s). FINDINGS: Mammographic  images were obtained following ultrasound guided biopsy of suspicious masses in the RIGHT breast at the 9 o'clock and 11 o'clock axes and ultrasound-guided biopsy of an enlarged lymph node in the RIGHT axilla. The biopsy marking clips are in expected positions at the sites of biopsy. IMPRESSION: 1. Appropriate positioning of the coil shaped biopsy marking clip at the site of biopsy in the upper outer quadrant, corresponding to the targeted mass at the 11 o'clock axis. 2. Appropriate positioning of the heart shaped biopsy marking clip at the site of biopsy in the upper outer  quadrant, corresponding to the targeted mass at the 9 o'clock axis. 3. Appropriate positioning of the tribell shaped biopsy marking clip at the site of biopsy in the lower RIGHT axilla. Final Assessment: Post Procedure Mammograms for Marker Placement Electronically Signed   By: Franki Cabot M.D.   On: 02/23/2021 14:22   MR LT BREAST BX W LOC DEV 1ST LESION IMAGE BX SPEC MR GUIDE  Addendum Date: 03/15/2021   ADDENDUM REPORT: 03/15/2021 12:42 ADDENDUM: Pathology revealed ADENOSIS, INCLUDING FOCAL SCLEROSING ADENOSIS, FIBROCYSTIC CHANGE- NO MALIGNANCY IDENTIFIED of the LEFT breast, upper outer. This was found to be concordant by Dr. Hassan Rowan. Pathology results were discussed with the patient by telephone. The patient reported doing well after the biopsy with tenderness at the site. Post biopsy instructions and care were reviewed and questions were answered. The patient was encouraged to call [facility] [place] for any additional concerns. The patient has a recent diagnosis of right breast cancer and should follow her outlined treatment plan. Pathology results reported by Stacie Acres RN on 03/15/2021. Electronically Signed   By: Margarette Canada M.D.   On: 03/15/2021 12:42   Result Date: 03/15/2021 CLINICAL DATA:  45 year old female for tissue sampling of 1.1 cm UPPER-OUTER LEFT breast mass. Recent diagnosis of RIGHT breast  cancer. EXAM: MRI GUIDED CORE NEEDLE BIOPSY OF THE LEFT BREAST TECHNIQUE: Multiplanar, multisequence MR imaging of the LEFT breast was performed both before and after administration of intravenous contrast. CONTRAST:  69m GADAVIST GADOBUTROL 1 MMOL/ML IV SOLN COMPARISON:  Previous exams. FINDINGS: I met with the patient, and we discussed the procedure of MRI guided biopsy, including risks, benefits, and alternatives. Specifically, we discussed the risks of infection, bleeding, tissue injury, clip migration, and inadequate sampling. Informed, written consent was given. The usual time out protocol was performed immediately prior to the procedure. Using sterile technique, 1% Lidocaine with and without epinephrine, MRI guidance, and a 9 gauge vacuum assisted device, biopsy was performed of the 1.1 cm mass in the middle to posterior depth UPPER OUTER LEFT breast using a LATERAL approach. At the conclusion of the procedure, a BARBELL tissue marker clip was deployed into the biopsy cavity. Follow-up 2-view mammogram was performed and dictated separately. IMPRESSION: MRI guided biopsy of the 1.1 cm UPPER OUTER LEFT breast mass. No apparent complications. Electronically Signed: By: JMargarette CanadaM.D. 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 This was found to be concordant by Dr. 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 This was found to be concordant by Dr. SFranki Cabot Pathology revealed INVASIVE DUCTAL CARCINOMA INVOLVING NODAL TISSUE of the Right axilla. This was found to be concordant by Dr. SFranki Cabot Pathology results were discussed with the patient by telephone. The patient reported doing well after the biopsies with tenderness at the sites. Post  biopsy instructions and care were reviewed and questions were answered. [facility] My direct phone number was provided. [facility] Pathology results reported by LTerie Purser RN on 02/26/2021. Electronically Signed   By: SRoxy HorsemanD.  On: 02/26/2021 15:04   Result Date: 02/26/2021 CLINICAL DATA:  Patient with suspicious masses within the RIGHT breast at the 11 o'clock and 9 o'clock axes, presenting today for ultrasound-guided core biopsies. Patient also with suspicious enlarged/morphologically abnormal lymph nodes in the RIGHT axilla, presenting today for ultrasound-guided core biopsy of 1 of these abnormal-appearing lymph nodes. EXAM: ULTRASOUND GUIDED RIGHT BREAST CORE NEEDLE BIOPSY x2 ULTRASOUND-GUIDED RIGHT AXILLARY LYMPH NODE CORE NEEDLE BIOPSY COMPARISON:  Previous exam(s). PROCEDURE: I met with the patient and we discussed the procedure of ultrasound-guided biopsy, including benefits and alternatives. We discussed the high likelihood of a successful procedure. We discussed the risks of the procedure, including infection, bleeding, tissue injury, clip migration, and inadequate sampling. Informed written consent was given. The usual time-out protocol was performed immediately prior to the procedure. Site 1: Lesion quadrant: Upper outer quadrant Using sterile technique and 1% Lidocaine as local anesthetic, under direct ultrasound visualization, a 12 gauge spring-loaded device was used to perform biopsy of the hypoechoic mass in the RIGHT breast at the 11 o'clock axis using a medial approach. At the conclusion of the procedure coil shaped tissue marker clip was deployed into the biopsy cavity. Site 2: Lesion quadrant: Upper outer quadrant Using sterile technique and 1% Lidocaine as local  anesthetic, under direct ultrasound visualization, a 12 gauge spring-loaded device was used to perform biopsy of the RIGHT breast mass at the 9 o'clock axis using a lateral approach. At the conclusion of the procedure heart shaped tissue marker clip was deployed into the biopsy cavity. Site 3: Lesion quadrant: RIGHT axilla Using sterile technique and 1% Lidocaine as local anesthetic, under direct ultrasound visualization, a 14 gauge spring-loaded device was used to perform biopsy of 1 of the enlarged/morphologically abnormal lymph nodes in the RIGHT axilla using a lateral approach. At the conclusion of the procedure tribell tissue marker clip was deployed into the biopsy cavity. Follow up 2 view mammogram was performed and dictated separately. IMPRESSION: 1. Ultrasound guided biopsy of the RIGHT breast mass at the 11 o'clock axis. No apparent complications. 2. Ultrasound guided biopsy of the RIGHT breast mass at the 9 o'clock axis. No apparent complications. 3. Ultrasound guided biopsy of 1 of the enlarged lymph nodes in the RIGHT axilla. No apparent complications. Electronically Signed: By: Franki Cabot M.D. 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 This was found to be concordant by Dr. Franki Cabot. 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 This was found to be concordant by Dr. Franki Cabot. Pathology revealed INVASIVE DUCTAL CARCINOMA INVOLVING NODAL TISSUE of the Right axilla. This was found to be concordant by Dr. Franki Cabot. Pathology results were discussed with the patient by telephone. The patient reported doing well after the biopsies with tenderness at the sites. Post biopsy instructions and care were reviewed and questions were answered. The  patient was encouraged to call The [place] for any additional concerns. My direct phone number was provided. The patient was referred to [facility] at [facility] on March 07, 2021. Pathology results reported by Terie Purser, RN on 02/26/2021. Electronically Signed   By: Franki Cabot M.D.   On: 02/26/2021 15:04   Result Date: 02/26/2021  CLINICAL DATA:  Patient with suspicious masses within the RIGHT breast at the 11 o'clock and 9 o'clock axes, presenting today for ultrasound-guided core biopsies. Patient also with suspicious enlarged/morphologically abnormal lymph nodes in the RIGHT axilla, presenting today for ultrasound-guided core biopsy of 1 of these abnormal-appearing lymph nodes. EXAM: ULTRASOUND GUIDED RIGHT BREAST CORE NEEDLE BIOPSY x2 ULTRASOUND-GUIDED RIGHT AXILLARY LYMPH NODE CORE NEEDLE BIOPSY COMPARISON:  Previous exam(s). PROCEDURE: I met with the patient and we discussed the procedure of ultrasound-guided biopsy, including benefits and alternatives. We discussed the high likelihood of a successful procedure. We discussed the risks of the procedure, including infection, bleeding, tissue injury, clip migration, and inadequate sampling. Informed written consent was given. The usual time-out protocol was performed immediately prior to the procedure. Site 1: Lesion quadrant: Upper outer quadrant Using sterile technique and 1% Lidocaine as local anesthetic, under direct ultrasound visualization, a 12 gauge spring-loaded device was used to perform biopsy of the hypoechoic mass in the RIGHT breast at the 11 o'clock axis using a medial approach. At the conclusion of the procedure coil shaped tissue marker clip was deployed into the biopsy cavity. Site 2: Lesion quadrant: Upper outer quadrant Using sterile technique and 1% Lidocaine as local anesthetic, under direct ultrasound visualization, a 12 gauge spring-loaded  device was used to perform biopsy of the RIGHT breast mass at the 9 o'clock axis using a lateral approach. At the conclusion of the procedure heart shaped tissue marker clip was deployed into the biopsy cavity. Site 3: Lesion quadrant: RIGHT axilla Using sterile technique and 1% Lidocaine as local anesthetic, under direct ultrasound visualization, a 14 gauge spring-loaded device was used to perform biopsy of 1 of the enlarged/morphologically abnormal lymph nodes in the RIGHT axilla using a lateral approach. At the conclusion of the procedure tribell tissue marker clip was deployed into the biopsy cavity. Follow up 2 view mammogram was performed and dictated separately. IMPRESSION: 1. Ultrasound guided biopsy of the RIGHT breast mass at the 11 o'clock axis. No apparent complications. 2. Ultrasound guided biopsy of the RIGHT breast mass at the 9 o'clock axis. No apparent complications. 3. Ultrasound guided biopsy of 1 of the enlarged lymph nodes in the RIGHT axilla. No apparent complications. Electronically Signed: By: Franki Cabot M.D. On: 02/23/2021 13:48     ELIGIBLE FOR AVAILABLE RESEARCH PROTOCOL:no  ASSESSMENT: 45 y.o. 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  It very likely is benign.  It will require yearly follow-up and at some point in the future may need to be resected.  She does have a small irregularity in the liver which is probably likely also a cyst or  a hemangioma.  This will need to be followed.  It should be imaged when we obtain the baseline MRI/MRCP for the pancreas lesion.  In short at this point and with the understanding that a bone scan is pending I do not have evidence for metastatic disease  We went over her echo results which are very favorable.  She has a very good understanding of how to take her supportive medicines and knows that she needs to start the dexamethasone the day before treatment.  She needs some dental work.  The very best time to get that done is a couple of days before her second cycle of chemotherapy, at which time we can be sure her labs will be fine.  That would be May 2 for example.  There are no limitations any procedure they might do that day but what they are planning apparently is a kappa.  She will return 03/28/2021 for cycle one of her chemotherapy and immunotherapy.  She will see me a week later and I asked her to keep a diary of symptoms so we can troubleshoot those most effectively  She knows to call for any other issue that may develop before the next visit  Total encounter time 45 minutes.Sarajane Jews C. Zarinah Oviatt, MD 03/22/2021 6:07 PM Medical Oncology and Hematology [facility] [place], [place] [address] Tel. [phone]    Fax. [phone]   This document serves as a record of services personally performed by Lurline Del, MD. It was created on his behalf by Wilburn Mylar, a trained medical scribe. The creation of this record is based on the scribe's personal observations and the provider's statements  to them.   I, Lurline Del MD, have reviewed the above documentation for accuracy and completeness, and I agree with the above.    *Total Encounter Time as defined by the Centers for Medicare and Medicaid Services includes, in addition to the face-to-face time of a patient visit (documented in the note above) non-face-to-face time: obtaining and reviewing outside history, ordering and reviewing medications, tests or procedures, care coordination (communications with other health care professionals or caregivers) and documentation in the medical record.

## 2021-03-22 ENCOUNTER — Ambulatory Visit (HOSPITAL_BASED_OUTPATIENT_CLINIC_OR_DEPARTMENT_OTHER)
Admission: RE | Admit: 2021-03-22 | Discharge: 2021-03-22 | Disposition: A | Discharge status: Home / Self Care | Payer: No Typology Code available for payment source | Attending: General Surgery | Admitting: General Surgery

## 2021-03-22 ENCOUNTER — Ambulatory Visit (HOSPITAL_BASED_OUTPATIENT_CLINIC_OR_DEPARTMENT_OTHER): Payer: No Typology Code available for payment source | Admitting: Anesthesiology

## 2021-03-22 ENCOUNTER — Encounter (HOSPITAL_BASED_OUTPATIENT_CLINIC_OR_DEPARTMENT_OTHER): Payer: Self-pay | Admitting: General Surgery

## 2021-03-22 ENCOUNTER — Other Ambulatory Visit: Payer: No Typology Code available for payment source

## 2021-03-22 ENCOUNTER — Inpatient Hospital Stay: Payer: No Typology Code available for payment source | Attending: Oncology | Admitting: Oncology

## 2021-03-22 ENCOUNTER — Ambulatory Visit (HOSPITAL_COMMUNITY): Payer: No Typology Code available for payment source

## 2021-03-22 ENCOUNTER — Encounter (HOSPITAL_BASED_OUTPATIENT_CLINIC_OR_DEPARTMENT_OTHER)
Admission: RE | Disposition: A | Discharge status: Home / Self Care | Payer: Self-pay | Source: Home / Self Care | Attending: General Surgery

## 2021-03-22 ENCOUNTER — Other Ambulatory Visit: Payer: Self-pay

## 2021-03-22 VITALS — BP 119/61 | HR 129 | Temp 97.7°F | Resp 18 | Ht 64.0 in | Wt 249.9 lb

## 2021-03-22 DIAGNOSIS — Z79899 Other long term (current) drug therapy: Secondary | ICD-10-CM | POA: Diagnosis not present

## 2021-03-22 DIAGNOSIS — R932 Abnormal findings on diagnostic imaging of liver and biliary tract: Secondary | ICD-10-CM | POA: Insufficient documentation

## 2021-03-22 DIAGNOSIS — C50411 Malignant neoplasm of upper-outer quadrant of right female breast: Secondary | ICD-10-CM | POA: Insufficient documentation

## 2021-03-22 DIAGNOSIS — K835 Biliary cyst: Secondary | ICD-10-CM | POA: Diagnosis not present

## 2021-03-22 DIAGNOSIS — Z5112 Encounter for antineoplastic immunotherapy: Secondary | ICD-10-CM | POA: Insufficient documentation

## 2021-03-22 DIAGNOSIS — Z801 Family history of malignant neoplasm of trachea, bronchus and lung: Secondary | ICD-10-CM | POA: Insufficient documentation

## 2021-03-22 DIAGNOSIS — Z8049 Family history of malignant neoplasm of other genital organs: Secondary | ICD-10-CM | POA: Insufficient documentation

## 2021-03-22 DIAGNOSIS — Z170 Estrogen receptor positive status [ER+]: Secondary | ICD-10-CM | POA: Diagnosis not present

## 2021-03-22 DIAGNOSIS — Z95828 Presence of other vascular implants and grafts: Secondary | ICD-10-CM

## 2021-03-22 DIAGNOSIS — K862 Cyst of pancreas: Secondary | ICD-10-CM | POA: Insufficient documentation

## 2021-03-22 DIAGNOSIS — Z5189 Encounter for other specified aftercare: Secondary | ICD-10-CM | POA: Insufficient documentation

## 2021-03-22 HISTORY — PX: PORTACATH PLACEMENT: SHX2246

## 2021-03-22 HISTORY — PX: BREAST BIOPSY: SHX20

## 2021-03-22 LAB — POCT PREGNANCY, URINE: Preg Test, Ur: NEGATIVE

## 2021-03-22 SURGERY — INSERTION, TUNNELED CENTRAL VENOUS DEVICE, WITH PORT
Anesthesia: General | Site: Chest | Laterality: Right

## 2021-03-22 MED ORDER — CEFAZOLIN SODIUM-DEXTROSE 2-4 GM/100ML-% IV SOLN
2.0000 g | INTRAVENOUS | Status: AC
  Administered 2021-03-22: 2 g via INTRAVENOUS

## 2021-03-22 MED ORDER — DEXAMETHASONE SODIUM PHOSPHATE 10 MG/ML IJ SOLN
INTRAMUSCULAR | Status: AC
  Filled 2021-03-22: qty 1

## 2021-03-22 MED ORDER — LIDOCAINE HCL (PF) 1 % IJ SOLN
INTRAMUSCULAR | Status: AC
  Filled 2021-03-22: qty 90

## 2021-03-22 MED ORDER — MIDAZOLAM HCL 2 MG/2ML IJ SOLN
INTRAMUSCULAR | Status: AC
  Filled 2021-03-22: qty 2

## 2021-03-22 MED ORDER — DROPERIDOL 2.5 MG/ML IJ SOLN
INTRAMUSCULAR | Status: DC | PRN
  Administered 2021-03-22: .625 mg via INTRAVENOUS

## 2021-03-22 MED ORDER — OXYCODONE HCL 5 MG PO TABS: 5.0000 mg | ORAL_TABLET | Freq: Once | ORAL | Status: DC | PRN

## 2021-03-22 MED ORDER — PROPOFOL 500 MG/50ML IV EMUL
INTRAVENOUS | Status: AC
  Filled 2021-03-22: qty 50

## 2021-03-22 MED ORDER — HEPARIN (PORCINE) IN NACL 2-0.9 UNITS/ML
INTRAMUSCULAR | Status: AC | PRN
  Administered 2021-03-22: 1

## 2021-03-22 MED ORDER — EPHEDRINE 5 MG/ML INJ
INTRAVENOUS | Status: AC
  Filled 2021-03-22: qty 10

## 2021-03-22 MED ORDER — HEPARIN SOD (PORK) LOCK FLUSH 100 UNIT/ML IV SOLN
INTRAVENOUS | Status: AC
  Filled 2021-03-22: qty 5

## 2021-03-22 MED ORDER — MIDAZOLAM HCL 5 MG/5ML IJ SOLN
INTRAMUSCULAR | Status: DC | PRN
  Administered 2021-03-22: 2 mg via INTRAVENOUS

## 2021-03-22 MED ORDER — MEPERIDINE HCL 25 MG/ML IJ SOLN: 6.2500 mg | INTRAMUSCULAR | Status: DC | PRN

## 2021-03-22 MED ORDER — AMISULPRIDE (ANTIEMETIC) 5 MG/2ML IV SOLN: 10.0000 mg | Freq: Once | INTRAVENOUS | Status: DC | PRN

## 2021-03-22 MED ORDER — FENTANYL CITRATE (PF) 100 MCG/2ML IJ SOLN
INTRAMUSCULAR | Status: DC | PRN
  Administered 2021-03-22: 100 ug via INTRAVENOUS

## 2021-03-22 MED ORDER — ACETAMINOPHEN 500 MG PO TABS
ORAL_TABLET | ORAL | Status: AC
  Filled 2021-03-22: qty 2

## 2021-03-22 MED ORDER — PROPOFOL 10 MG/ML IV BOLUS
INTRAVENOUS | Status: DC | PRN
  Administered 2021-03-22: 80 mg via INTRAVENOUS

## 2021-03-22 MED ORDER — HYDROMORPHONE HCL 1 MG/ML IJ SOLN: 0.2500 mg | INTRAMUSCULAR | Status: DC | PRN

## 2021-03-22 MED ORDER — CEFAZOLIN SODIUM-DEXTROSE 2-4 GM/100ML-% IV SOLN
INTRAVENOUS | Status: AC
  Filled 2021-03-22: qty 100

## 2021-03-22 MED ORDER — FENTANYL CITRATE (PF) 100 MCG/2ML IJ SOLN
INTRAMUSCULAR | Status: AC
  Filled 2021-03-22: qty 2

## 2021-03-22 MED ORDER — PHENYLEPHRINE HCL (PRESSORS) 10 MG/ML IV SOLN
INTRAVENOUS | Status: DC | PRN
  Administered 2021-03-22: 80 ug via INTRAVENOUS

## 2021-03-22 MED ORDER — PROMETHAZINE HCL 25 MG/ML IJ SOLN: 6.2500 mg | INTRAMUSCULAR | Status: DC | PRN

## 2021-03-22 MED ORDER — HEPARIN SOD (PORK) LOCK FLUSH 100 UNIT/ML IV SOLN
INTRAVENOUS | Status: DC | PRN
  Administered 2021-03-22: 500 [IU] via INTRAVENOUS

## 2021-03-22 MED ORDER — LACTATED RINGERS IV SOLN: INTRAVENOUS | Status: DC

## 2021-03-22 MED ORDER — OXYCODONE HCL 5 MG PO TABS: 5.0000 mg | ORAL_TABLET | Freq: Four times a day (QID) | ORAL | 0 refills | Status: DC | PRN

## 2021-03-22 MED ORDER — OXYCODONE HCL 5 MG/5ML PO SOLN: 5.0000 mg | Freq: Once | ORAL | Status: DC | PRN

## 2021-03-22 MED ORDER — LIDOCAINE HCL (CARDIAC) PF 100 MG/5ML IV SOSY
PREFILLED_SYRINGE | INTRAVENOUS | Status: DC | PRN
  Administered 2021-03-22: 80 mg via INTRAVENOUS

## 2021-03-22 MED ORDER — HEPARIN (PORCINE) IN NACL 1000-0.9 UT/500ML-% IV SOLN
INTRAVENOUS | Status: AC
  Filled 2021-03-22: qty 500

## 2021-03-22 MED ORDER — DEXAMETHASONE SODIUM PHOSPHATE 4 MG/ML IJ SOLN
INTRAMUSCULAR | Status: DC | PRN
  Administered 2021-03-22: 10 mg via INTRAVENOUS

## 2021-03-22 MED ORDER — ONDANSETRON HCL 4 MG/2ML IJ SOLN
INTRAMUSCULAR | Status: AC
  Filled 2021-03-22: qty 2

## 2021-03-22 MED ORDER — SUCCINYLCHOLINE CHLORIDE 200 MG/10ML IV SOSY
PREFILLED_SYRINGE | INTRAVENOUS | Status: AC
  Filled 2021-03-22: qty 10

## 2021-03-22 MED ORDER — LIDOCAINE-EPINEPHRINE (PF) 1 %-1:200000 IJ SOLN
INTRAMUSCULAR | Status: DC | PRN
  Administered 2021-03-22: 13 mL via INTRAMUSCULAR

## 2021-03-22 MED ORDER — ACETAMINOPHEN 500 MG PO TABS
1000.0000 mg | ORAL_TABLET | ORAL | Status: AC
  Administered 2021-03-22: 1000 mg via ORAL

## 2021-03-22 MED ORDER — PHENYLEPHRINE 40 MCG/ML (10ML) SYRINGE FOR IV PUSH (FOR BLOOD PRESSURE SUPPORT)
PREFILLED_SYRINGE | INTRAVENOUS | Status: AC
  Filled 2021-03-22: qty 10

## 2021-03-22 MED ORDER — LIDOCAINE 2% (20 MG/ML) 5 ML SYRINGE
INTRAMUSCULAR | Status: AC
  Filled 2021-03-22: qty 5

## 2021-03-22 MED ORDER — ONDANSETRON HCL 4 MG/2ML IJ SOLN
INTRAMUSCULAR | Status: DC | PRN
  Administered 2021-03-22: 4 mg via INTRAVENOUS

## 2021-03-22 MED ORDER — CHLORHEXIDINE GLUCONATE CLOTH 2 % EX PADS: 6.0000 | MEDICATED_PAD | Freq: Once | CUTANEOUS | Status: DC

## 2021-03-22 SURGICAL SUPPLY — 56 items
ADH SKN CLS APL DERMABOND .7 (GAUZE/BANDAGES/DRESSINGS) ×2
APL PRP STRL LF DISP 70% ISPRP (MISCELLANEOUS) ×2
BAG DECANTER FOR FLEXI CONT (MISCELLANEOUS) ×3 IMPLANT
BLADE HEX COATED 2.75 (ELECTRODE) ×3 IMPLANT
BLADE SURG 11 STRL SS (BLADE) ×3 IMPLANT
BLADE SURG 15 STRL LF DISP TIS (BLADE) ×2 IMPLANT
BLADE SURG 15 STRL SS (BLADE) ×3
CHLORAPREP W/TINT 26 (MISCELLANEOUS) ×3 IMPLANT
COVER BACK TABLE 60X90IN (DRAPES) ×3 IMPLANT
COVER MAYO STAND STRL (DRAPES) ×3 IMPLANT
DERMABOND ADVANCED (GAUZE/BANDAGES/DRESSINGS) ×1
DERMABOND ADVANCED .7 DNX12 (GAUZE/BANDAGES/DRESSINGS) ×2 IMPLANT
DRAPE C-ARM 42X72 X-RAY (DRAPES) ×3 IMPLANT
DRAPE LAPAROSCOPIC ABDOMINAL (DRAPES) ×1 IMPLANT
DRAPE UTILITY XL STRL (DRAPES) ×5 IMPLANT
DRSG TEGADERM 4X4.75 (GAUZE/BANDAGES/DRESSINGS) IMPLANT
ELECT COATED BLADE 2.86 ST (ELECTRODE) ×3 IMPLANT
ELECT REM PT RETURN 9FT ADLT (ELECTROSURGICAL) ×3
ELECTRODE REM PT RTRN 9FT ADLT (ELECTROSURGICAL) ×2 IMPLANT
GLOVE SURG ENC MOIS LTX SZ6 (GLOVE) ×3 IMPLANT
GLOVE SURG ENC MOIS LTX SZ6.5 (GLOVE) ×2 IMPLANT
GLOVE SURG UNDER POLY LF SZ6.5 (GLOVE) ×3 IMPLANT
GLOVE SURG UNDER POLY LF SZ7 (GLOVE) ×1 IMPLANT
GOWN STRL REUS W/ TWL LRG LVL3 (GOWN DISPOSABLE) ×2 IMPLANT
GOWN STRL REUS W/TWL 2XL LVL3 (GOWN DISPOSABLE) ×3 IMPLANT
GOWN STRL REUS W/TWL LRG LVL3 (GOWN DISPOSABLE) ×6
IV CONNECTOR ONE LINK NDLESS (IV SETS) IMPLANT
KIT MARKER MARGIN INK (KITS) IMPLANT
KIT PORT POWER 8FR ISP CVUE (Port) ×1 IMPLANT
NDL HYPO 25X1 1.5 SAFETY (NEEDLE) ×2 IMPLANT
NEEDLE HYPO 25X1 1.5 SAFETY (NEEDLE) ×3 IMPLANT
NS IRRIG 1000ML POUR BTL (IV SOLUTION) ×2 IMPLANT
PACK BASIN DAY SURGERY FS (CUSTOM PROCEDURE TRAY) ×3 IMPLANT
PENCIL SMOKE EVACUATOR (MISCELLANEOUS) ×3 IMPLANT
PUNCH BIOPSY DERMAL 4MM (MISCELLANEOUS) ×1 IMPLANT
SLEEVE SCD COMPRESS KNEE MED (STOCKING) ×3 IMPLANT
SPONGE LAP 18X18 RF (DISPOSABLE) ×3 IMPLANT
STRIP CLOSURE SKIN 1/2X4 (GAUZE/BANDAGES/DRESSINGS) ×3 IMPLANT
SUT MNCRL AB 4-0 PS2 18 (SUTURE) ×3 IMPLANT
SUT MON AB 4-0 PC3 18 (SUTURE) ×3 IMPLANT
SUT PROLENE 2 0 SH DA (SUTURE) ×6 IMPLANT
SUT SILK 2 0 SH (SUTURE) IMPLANT
SUT VIC AB 2-0 SH 27 (SUTURE)
SUT VIC AB 2-0 SH 27XBRD (SUTURE) IMPLANT
SUT VIC AB 3-0 54X BRD REEL (SUTURE) IMPLANT
SUT VIC AB 3-0 BRD 54 (SUTURE)
SUT VIC AB 3-0 SH 27 (SUTURE)
SUT VIC AB 3-0 SH 27X BRD (SUTURE) ×2 IMPLANT
SUT VICRYL 3-0 CR8 SH (SUTURE) ×1 IMPLANT
SYR 10ML LL (SYRINGE) ×3 IMPLANT
SYR 5ML LUER SLIP (SYRINGE) ×3 IMPLANT
SYR BULB EAR ULCER 3OZ GRN STR (SYRINGE) ×2 IMPLANT
SYR CONTROL 10ML LL (SYRINGE) ×3 IMPLANT
TOWEL GREEN STERILE FF (TOWEL DISPOSABLE) ×3 IMPLANT
TRAY FAXITRON CT DISP (TRAY / TRAY PROCEDURE) IMPLANT
TUBE CONNECTING 20X1/4 (TUBING) ×2 IMPLANT

## 2021-03-22 NOTE — Anesthesia Postprocedure Evaluation (Signed)
Anesthesia Post Note  Patient: Diane Miranda  Procedure(s) Performed: INSERTION PORT-A-CATH (Left Chest) PUNCH BIOPSY OF RIGHT AREOLA (Right Breast)     Patient location during evaluation: PACU Anesthesia Type: General Level of consciousness: awake and alert Pain management: pain level controlled Vital Signs Assessment: post-procedure vital signs reviewed and stable Respiratory status: spontaneous breathing, nonlabored ventilation and respiratory function stable Cardiovascular status: blood pressure returned to baseline and stable Postop Assessment: no apparent nausea or vomiting Anesthetic complications: no   No complications documented.  Last Vitals:  Vitals:   03/22/21 0900 03/22/21 0913  BP: (!) 141/89 (!) 150/87  Pulse: (!) 101 (!) 101  Resp: 17 16  Temp:  37.2 C  SpO2: 95% 97%    Last Pain:  Vitals:   03/22/21 0913  TempSrc:   PainSc: 0-No pain                 Lynda Rainwater

## 2021-03-22 NOTE — Anesthesia Procedure Notes (Signed)
Procedure Name: LMA Insertion Date/Time: 03/22/2021 7:41 AM Performed by: Willa Frater, CRNA Pre-anesthesia Checklist: Patient identified, Emergency Drugs available, Suction available and Patient being monitored Patient Re-evaluated:Patient Re-evaluated prior to induction Oxygen Delivery Method: Circle system utilized Preoxygenation: Pre-oxygenation with 100% oxygen Induction Type: IV induction Ventilation: Mask ventilation without difficulty LMA: LMA inserted LMA Size: 4.0 Number of attempts: 1 Airway Equipment and Method: Bite block Placement Confirmation: positive ETCO2 Tube secured with: Tape Dental Injury: Teeth and Oropharynx as per pre-operative assessment

## 2021-03-22 NOTE — Anesthesia Preprocedure Evaluation (Signed)
Anesthesia Evaluation  Patient identified by MRN, date of birth, ID band Patient awake    Reviewed: Allergy & Precautions, NPO status , Patient's Chart, lab work & pertinent test results  Airway Mallampati: II  TM Distance: >3 FB Neck ROM: Full    Dental  (+) Teeth Intact, Dental Advisory Given   Pulmonary asthma ,    Pulmonary exam normal breath sounds clear to auscultation       Cardiovascular hypertension, Pt. on medications Normal cardiovascular exam Rhythm:Regular Rate:Normal     Neuro/Psych  Headaches,    GI/Hepatic negative GI ROS, Neg liver ROS,   Endo/Other  Morbid obesityObesity   Renal/GU negative Renal ROS     Musculoskeletal negative musculoskeletal ROS (+)   Abdominal (+) + obese,   Peds  Hematology negative hematology ROS (+)   Anesthesia Other Findings Day of surgery medications reviewed with the patient.  Reproductive/Obstetrics negative OB ROS                             Anesthesia Physical  Anesthesia Plan  ASA: III  Anesthesia Plan: General   Post-op Pain Management:    Induction: Intravenous  PONV Risk Score and Plan: 3 and Ondansetron, Dexamethasone, Midazolam and Treatment may vary due to age or medical condition  Airway Management Planned: LMA  Additional Equipment:   Intra-op Plan:   Post-operative Plan: Extubation in OR  Informed Consent: I have reviewed the patients History and Physical, chart, labs and discussed the procedure including the risks, benefits and alternatives for the proposed anesthesia with the patient or authorized representative who has indicated his/her understanding and acceptance.     Dental advisory given  Plan Discussed with: CRNA  Anesthesia Plan Comments: (Risks/benefits of general anesthesia discussed with patient including risk of damage to teeth, lips, gum, and tongue, nausea/vomiting, allergic reactions to  medications, and the possibility of heart attack, stroke and death.  All patient questions answered.  Patient wishes to proceed.)        Anesthesia Quick Evaluation

## 2021-03-22 NOTE — Interval H&P Note (Signed)
History and Physical Interval Note:  03/22/2021 7:28 AM  Diane Miranda  has presented today for surgery, with the diagnosis of RIGHT BREAST CANCER.  The various methods of treatment have been discussed with the patient and family. After consideration of risks, benefits and other options for treatment, the patient has consented to  Procedure(s): INSERTION PORT-A-CATH (N/A) PUNCH BIOPSY OF RIGHT AREOLA (Right) as a surgical intervention.  The patient's history has been reviewed, patient examined, no change in status, stable for surgery.  I have reviewed the patient's chart and labs.  Questions were answered to the patient's satisfaction.     Stark Klein

## 2021-03-22 NOTE — Discharge Instructions (Addendum)
[place] Office Phone Number [phone]   POST OP INSTRUCTIONS  Always review your discharge instruction sheet given to you by the facility where your surgery was performed.  IF YOU HAVE DISABILITY OR FAMILY LEAVE FORMS, YOU MUST BRING THEM TO THE OFFICE FOR PROCESSING.  DO NOT GIVE THEM TO YOUR DOCTOR.  1. A prescription for pain medication may be given to you upon discharge.  Take your pain medication as prescribed, if needed.  If narcotic pain medicine is not needed, then you may take acetaminophen (Tylenol) or ibuprofen (Advil) as needed. 2. Take your usually prescribed medications unless otherwise directed 3. If you need a refill on your pain medication, please contact your pharmacy.  They will contact our office to request authorization.  Prescriptions will not be filled after 5pm or on week-ends. 4. You should eat very light the first 24 hours after surgery, such as soup, crackers, pudding, etc.  Resume your normal diet the day after surgery 5. It is common to experience some constipation if taking pain medication after surgery.  Increasing fluid intake and taking a stool softener will usually help or prevent this problem from occurring.  A mild laxative (Milk of Magnesia or Miralax) should be taken according to package directions if there are no bowel movements after 48 hours. 6. You may shower in 48 hours.  The surgical glue will flake off in 2-3 weeks.   7. ACTIVITIES:  No strenuous activity or heavy lifting for 1 week.   a. You may drive when you no longer are taking prescription pain medication, you can comfortably wear a seatbelt, and you can safely maneuver your car and apply brakes. b. RETURN TO WORK:  __________to be determined_______________ Dennis Bast should see your doctor in the office for a follow-up appointment approximately three-four weeks after your surgery.    WHEN TO CALL YOUR DOCTOR: 1. Fever over 101.0 2. Nausea and/or vomiting. 3. Extreme swelling or  bruising. 4. Continued bleeding from incision. 5. Increased pain, redness, or drainage from the incision.  The clinic staff is available to answer your questions during regular business hours.  Please don't hesitate to call and ask to speak to one of the nurses for clinical concerns.  If you have a medical emergency, go to the nearest emergency room or call 911.  A surgeon from [facility] is always on call at the hospital.  For further questions, please visit [email or web address]   No Tylenol until 12:50 pm   Post Anesthesia Home Care Instructions  Activity: Get plenty of rest for the remainder of the day. A responsible individual must stay with you for 24 hours following the procedure.  For the next 24 hours, DO NOT: -Drive a car -Paediatric nurse -Drink alcoholic beverages -Take any medication unless instructed by your physician -Make any legal decisions or sign important papers.  Meals: Start with liquid foods such as gelatin or soup. Progress to regular foods as tolerated. Avoid greasy, spicy, heavy foods. If nausea and/or vomiting occur, drink only clear liquids until the nausea and/or vomiting subsides. Call your physician if vomiting continues.  Special Instructions/Symptoms: Your throat may feel dry or sore from the anesthesia or the breathing tube placed in your throat during surgery. If this causes discomfort, gargle with warm salt water. The discomfort should disappear within 24 hours.  If you had a scopolamine patch placed behind your ear for the management of post- operative nausea and/or vomiting:  1. The medication in the patch is effective  for 72 hours, after which it should be removed.  Wrap patch in a tissue and discard in the trash. Wash hands thoroughly with soap and water. 2. You may remove the patch earlier than 72 hours if you experience unpleasant side effects which may include dry mouth, dizziness or visual disturbances. 3. Avoid touching  the patch. Wash your hands with soap and water after contact with the patch.

## 2021-03-22 NOTE — Transfer of Care (Signed)
Immediate Anesthesia Transfer of Care Note  Patient: Diane Miranda  Procedure(s) Performed: INSERTION PORT-A-CATH (Left Chest) PUNCH BIOPSY OF RIGHT AREOLA (Right Breast)  Patient Location: PACU  Anesthesia Type:General  Level of Consciousness: awake, alert , oriented and drowsy  Airway & Oxygen Therapy: Patient Spontanous Breathing and Patient connected to face mask oxygen  Post-op Assessment: Report given to RN and Post -op Vital signs reviewed and stable  Post vital signs: Reviewed and stable  Last Vitals:  Vitals Value Taken Time  BP 131/88 03/22/21 0845  Temp 37.1 C 03/22/21 0845  Pulse 106 03/22/21 0845  Resp 23 03/22/21 0845  SpO2 99 % 03/22/21 0845  Vitals shown include unvalidated device data.  Last Pain:  Vitals:   03/22/21 0647  TempSrc: Oral  PainSc: 0-No pain         Complications: No complications documented.

## 2021-03-22 NOTE — Op Note (Signed)
PREOPERATIVE DIAGNOSIS:  Right breast cancer     POSTOPERATIVE DIAGNOSIS:  Same     PROCEDURE: Left subclavian port placement, Bard ClearVue Power Port, MRI safe, 8-French.  Right areolar punch biopsy     SURGEON:  Stark Klein, MD      ANESTHESIA:  General   FINDINGS:  Good venous return, easy flush, and tip of the catheter and   SVC 23.5 cm.      SPECIMEN:  None.      ESTIMATED BLOOD LOSS:  Minimal.      COMPLICATIONS:  None known.      PROCEDURE:  Pt was identified in the holding area and taken to   the operating room, where patient was placed supine on the operating room   table.  General anesthesia was induced.  Patient's arms were tucked and the upper   chest and neck were prepped and draped in sterile fashion.  Time-out was   performed according to the surgical safety check list.  When all was   correct, we continued.   Local anesthetic was administered over this   area at the angle of the clavicle.  The vein was accessed with 2 pass(es) of the needle. There was good venous return.  It took a bit of manipulation of the syringe and the arm to get the wire to pass.  At that point,  the wire passed easily with no ectopy.  Fluoroscopy was used to confirm that the wire was in the vena cava.      The patient was placed back level and the area for the pocket was anethetized   with local anesthetic.  A 3-cm transverse incision was made with a #15   blade.  Cautery was used to divide the subcutaneous tissues down to the   pectoralis muscle.  An [employer] retractor was used to elevate the skin   while a pocket was created on top of the pectoralis fascia.  The port   was placed into the pocket to confirm that it was of adequate size.  The   catheter was preattached to the port.  The port was then secured to the   pectoralis fascia with four 2-0 Prolene sutures.  These were clamped and   not tied down yet.    The catheter was tunneled through to the wire exit   site.  The catheter  was placed along the wire to determine what length it should be to be in the SVC.  The catheter was cut at 23.5 cm.  The tunneler sheath and dilator were passed over the wire and the dilator and wire were removed.  The catheter was advanced through the tunneler sheath and the tunneler sheath was pulled away.  Care was taken to keep the catheter in the tunneler sheath as this occurred. This was advanced and the tunneler sheath was removed.  There was good venous   return and easy flush of the catheter.  The Prolene sutures were tied   down to the pectoral fascia.  The skin was reapproximated using 3-0   Vicryl interrupted deep dermal sutures.    Fluoroscopy was used to re-confirm good position of the catheter.  The skin   was then closed using 4-0 Monocryl in a subcuticular fashion.  The port was flushed with concentrated heparin flush as well.  The wounds were then cleaned, dried, and dressed with Dermabond.    The right areola thickened area was identified and a 4 mm punch biopsy was  taken.  This was closed with a 4-0 single monocryl and dressed with a bandaid.    The patient was awakened from anesthesia and taken to the PACU in stable condition.  Needle, sponge, and instrument counts were correct.               Stark Klein, MD

## 2021-03-23 ENCOUNTER — Encounter (HOSPITAL_COMMUNITY)
Admission: RE | Admit: 2021-03-23 | Discharge: 2021-03-23 | Disposition: A | Discharge status: Home / Self Care | Payer: No Typology Code available for payment source | Source: Ambulatory Visit | Attending: Oncology | Admitting: Oncology

## 2021-03-23 ENCOUNTER — Encounter (HOSPITAL_BASED_OUTPATIENT_CLINIC_OR_DEPARTMENT_OTHER): Payer: Self-pay | Admitting: General Surgery

## 2021-03-23 ENCOUNTER — Other Ambulatory Visit: Payer: Self-pay

## 2021-03-23 DIAGNOSIS — C50411 Malignant neoplasm of upper-outer quadrant of right female breast: Secondary | ICD-10-CM | POA: Insufficient documentation

## 2021-03-23 DIAGNOSIS — N6320 Unspecified lump in the left breast, unspecified quadrant: Secondary | ICD-10-CM | POA: Diagnosis present

## 2021-03-23 DIAGNOSIS — Z170 Estrogen receptor positive status [ER+]: Secondary | ICD-10-CM | POA: Insufficient documentation

## 2021-03-23 DIAGNOSIS — R928 Other abnormal and inconclusive findings on diagnostic imaging of breast: Secondary | ICD-10-CM | POA: Diagnosis present

## 2021-03-23 MED ORDER — PROCHLORPERAZINE MALEATE 10 MG PO TABS: 10.0000 mg | ORAL_TABLET | Freq: Four times a day (QID) | ORAL | 1 refills | Status: DC | PRN

## 2021-03-23 MED ORDER — LORATADINE 10 MG PO TABS: 10.0000 mg | ORAL_TABLET | Freq: Every day | ORAL | 4 refills | Status: DC

## 2021-03-23 MED ORDER — DEXAMETHASONE 4 MG PO TABS: 8.0000 mg | ORAL_TABLET | Freq: Two times a day (BID) | ORAL | 1 refills | Status: DC

## 2021-03-23 MED ORDER — ONDANSETRON HCL 8 MG PO TABS: 8.0000 mg | ORAL_TABLET | Freq: Two times a day (BID) | ORAL | 1 refills | Status: DC | PRN

## 2021-03-23 MED ORDER — TECHNETIUM TC 99M MEDRONATE IV KIT
20.0000 | PACK | Freq: Once | INTRAVENOUS | Status: AC | PRN
  Administered 2021-03-23: 20.1 via INTRAVENOUS

## 2021-03-23 MED ORDER — LIDOCAINE-PRILOCAINE 2.5-2.5 % EX CREA: TOPICAL_CREAM | CUTANEOUS | 3 refills | Status: DC

## 2021-03-23 NOTE — Telephone Encounter (Signed)
Pt called and states she went to pick up Rx's prescribed on 3/23 and they were not there. After further investigating, these medications were d/c in error by nurse at the surgery center. Medications have been reordered and sent to phx of choice. Pt is aware and verbalizes thanks and understanding.

## 2021-03-23 NOTE — Progress Notes (Signed)
The following biosimilar Ziextenzo ([id number]) has been selected for use in this patient do to insurance requirements.  Henreitta Leber, PharmD 03/23/21 @ 0900

## 2021-03-26 ENCOUNTER — Encounter: Payer: Self-pay | Admitting: *Deleted

## 2021-03-26 LAB — SURGICAL PATHOLOGY

## 2021-03-27 ENCOUNTER — Telehealth: Payer: Self-pay | Admitting: Genetic Counselor

## 2021-03-27 ENCOUNTER — Ambulatory Visit: Payer: Self-pay | Admitting: Genetic Counselor

## 2021-03-27 ENCOUNTER — Encounter: Payer: Self-pay | Admitting: Genetic Counselor

## 2021-03-27 DIAGNOSIS — Z8049 Family history of malignant neoplasm of other genital organs: Secondary | ICD-10-CM

## 2021-03-27 DIAGNOSIS — Z1379 Encounter for other screening for genetic and chromosomal anomalies: Secondary | ICD-10-CM

## 2021-03-27 DIAGNOSIS — Z801 Family history of malignant neoplasm of trachea, bronchus and lung: Secondary | ICD-10-CM

## 2021-03-27 DIAGNOSIS — C50411 Malignant neoplasm of upper-outer quadrant of right female breast: Secondary | ICD-10-CM

## 2021-03-27 DIAGNOSIS — Z1211 Encounter for screening for malignant neoplasm of colon: Secondary | ICD-10-CM | POA: Insufficient documentation

## 2021-03-27 NOTE — Progress Notes (Signed)
HPI:  Diane Miranda was previously seen in the [place] clinic due to a personal history of breast cancer and concerns regarding a hereditary predisposition to cancer. Please refer to our prior cancer genetics clinic note for more information regarding our discussion, assessment and recommendations, at the time. Diane Miranda recent genetic test results were disclosed to her, as were recommendations warranted by these results. These results and recommendations are discussed in more detail below.  CANCER HISTORY:  Oncology History  Malignant neoplasm of upper-outer quadrant of right breast in female, estrogen receptor positive ([place])  03/01/2021 Initial Diagnosis   Malignant neoplasm of upper-outer quadrant of right breast in female, estrogen receptor positive ([place])   03/07/2021 Cancer Staging   Staging form: Breast, AJCC 8th Edition - Clinical: Stage IIB (cT3, cN1, cM0, G3, ER+, PR+, HER2+) - Signed by Chauncey Cruel, MD on 03/07/2021 Histologic grading system: 3 grade system   03/26/2021 Genetic Testing   Negative hereditary cancer genetic testing: no pathogenic variants detected in [facility] CustomNext-Cancer +RNAinsight Panel.  The report date is March 26, 2021.    The CustomNext-Cancer+RNAinsight panel offered by [facility] includes sequencing and rearrangement analysis for the following 47 genes:  APC, ATM, AXIN2, BARD1, BMPR1A, BRCA1, BRCA2, BRIP1, CDH1, CDK4, CDKN2A, CHEK2, DICER1, EPCAM, GREM1, HOXB13, MEN1, MLH1, MSH2, MSH3, MSH6, MUTYH, NBN, NF1, NF2, NTHL1, PALB2, PMS2, POLD1, POLE, PTEN, RAD51C, RAD51D, RECQL, RET, SDHA, SDHAF2, SDHB, SDHC, SDHD, SMAD4, SMARCA4, STK11, TP53, TSC1, TSC2, and VHL.  RNA data is routinely analyzed for use in variant interpretation for all genes.   03/28/2021 -  Chemotherapy    Patient is on Treatment Plan: BREAST  DOCETAXEL + CARBOPLATIN + TRASTUZUMAB + PERTUZUMAB  (TCHP) Q21D         FAMILY HISTORY:  We obtained a detailed,  4-generation family history.  Significant diagnoses are listed below: Family History  Problem Relation Age of Onset  . Uterine cancer Other 22       MGM's sister  . Throat cancer Other        MGM's brother; dx after 25  . Lung cancer Other        PGM's sister; dx 37s    Diane Miranda has two biological daughters, ages 28 and 58, and one daughter, age 80, who was adopted into the family.  Diane Miranda has two paternal half brothers, ages 45 and 81, both without a cancer history.    Diane Miranda mother passed away at age 13 and did not have cancer.  Diane Miranda mother did not have siblings.  Diane Miranda maternal grandmother had a sister with uterine cancer diagnosed at 53 and a brother with throat cancer diagnosed after the age of 58.    Diane Miranda father is living at age 85 and did not have cancer.  Diane Miranda paternal grandmother passed away before 51 of an unknown cause, possibly cancer per Diane Miranda.  Diane Miranda paternal grandmother's sister had lung cancer diagnosed in her 17s.   Diane Miranda is unaware of previous family history of genetic testing for hereditary cancer risks. There is no reported Ashkenazi Jewish ancestry. There is no known consanguinity.   GENETIC TEST RESULTS: Genetic testing reported out on March 26, 2021.  The CustomNext-Cancer Panel through the [facility] found no pathogenic mutations. The CustomNext-Cancer+RNAinsight panel offered by [facility] includes sequencing and rearrangement analysis for the following 47 genes:  APC, ATM, AXIN2, BARD1, BMPR1A, BRCA1, BRCA2, BRIP1, CDH1, CDK4, CDKN2A, CHEK2, DICER1, EPCAM, GREM1,  HOXB13, MEN1, MLH1, MSH2, MSH3, MSH6, MUTYH, NBN, NF1, NF2, NTHL1, PALB2, PMS2, POLD1, POLE, PTEN, RAD51C, RAD51D, RECQL, RET, SDHA, SDHAF2, SDHB, SDHC, SDHD, SMAD4, SMARCA4, STK11, TP53, TSC1, TSC2, and VHL.  RNA data is routinely analyzed for use in variant interpretation for all genes.  The test report has been scanned into  EPIC and is located under the Molecular Pathology section of the Results Review tab.  A portion of the result report is included below for reference.     We discussed with Diane Miranda that because current genetic testing is not perfect, it is possible there may be a gene mutation in one of these genes that current testing cannot detect, but that chance is small.  We also discussed, that there could be another gene that has not yet been discovered, or that we have not yet tested, that is responsible for the cancer diagnoses in the family. It is also possible there is a hereditary cause for the cancer in the family that Diane Miranda did not inherit and therefore was not identified in her testing.  Therefore, it is important to remain in touch with cancer genetics in the future so that we can continue to offer Diane Miranda the most up to date genetic testing.   ADDITIONAL GENETIC TESTING: We discussed with Diane Miranda that there are other genes that are associated with increased cancer risk that can be analyzed. Should Diane Miranda wish to pursue additional genetic testing, we are happy to discuss and coordinate this testing, at any time.    CANCER SCREENING RECOMMENDATIONS: Diane Miranda test result is considered negative (normal).  This means that we have not identified a hereditary cause for her personal history of cancer at this time. Most cancers happen by chance and this negative test suggests that her cancer may fall into this category.    While reassuring, this does not definitively rule out a hereditary predisposition to cancer. It is still possible that there could be genetic mutations that are undetectable by current technology. There could be genetic mutations in genes that have not been tested or identified to increase cancer risk.  Therefore, it is recommended she continue to follow the cancer management and screening guidelines provided by her oncology and primary healthcare provider.   An  individual's cancer risk and medical management are not determined by genetic test results alone. Overall cancer risk assessment incorporates additional factors, including personal medical history, family history, and any available genetic information that may result in a personalized plan for cancer prevention and surveillance  RECOMMENDATIONS FOR FAMILY MEMBERS:  Individuals in this family might be at some increased risk of developing cancer, over the general population risk, simply due to the family history of cancer.  We recommended women in this family have a yearly mammogram beginning at age 39, or 39 years younger than the earliest onset of cancer, an annual clinical breast exam, and perform monthly breast self-exams. Women in this family should also have a gynecological exam as recommended by their primary provider. Family members should be referred for colonoscopy starting at age 54.   FOLLOW-UP: Lastly, we discussed with Diane Miranda that cancer genetics is a rapidly advancing field and it is possible that new genetic tests will be appropriate for her and/or her family members in the future. We encouraged her to remain in contact with cancer genetics on an annual basis so we can update her personal and family histories and let her know of advances in cancer genetics that  may benefit this family.   Our contact number was provided. Ms. Meeuwsen questions were answered to her satisfaction, and she knows she is welcome to call us at anytime with additional questions or concerns.     Prophet Renwick M. Joette Catching, [place], [facility] Genetic Counselor [email or web address] (P) [phone]

## 2021-03-27 NOTE — Telephone Encounter (Signed)
Revealed negative genetic testing.  Discussed that we do not know why she has breast cancer. It could be sporadic, due to a different gene that we are not testing, or maybe our current technology may not be able to pick something up.  It will be important for her to keep in contact with genetics to keep up with whether additional testing may be needed.

## 2021-03-28 ENCOUNTER — Inpatient Hospital Stay: Payer: No Typology Code available for payment source

## 2021-03-28 ENCOUNTER — Other Ambulatory Visit: Payer: Self-pay | Admitting: Oncology

## 2021-03-28 ENCOUNTER — Encounter: Payer: Self-pay | Admitting: Licensed Clinical Social Worker

## 2021-03-28 ENCOUNTER — Encounter: Payer: Self-pay | Admitting: *Deleted

## 2021-03-28 ENCOUNTER — Other Ambulatory Visit: Payer: Self-pay

## 2021-03-28 VITALS — BP 129/69 | HR 102 | Temp 99.2°F | Resp 18 | Wt 236.0 lb

## 2021-03-28 DIAGNOSIS — Z170 Estrogen receptor positive status [ER+]: Secondary | ICD-10-CM

## 2021-03-28 DIAGNOSIS — Z5189 Encounter for other specified aftercare: Secondary | ICD-10-CM | POA: Diagnosis not present

## 2021-03-28 DIAGNOSIS — R932 Abnormal findings on diagnostic imaging of liver and biliary tract: Secondary | ICD-10-CM | POA: Diagnosis not present

## 2021-03-28 DIAGNOSIS — Z801 Family history of malignant neoplasm of trachea, bronchus and lung: Secondary | ICD-10-CM | POA: Diagnosis not present

## 2021-03-28 DIAGNOSIS — C50411 Malignant neoplasm of upper-outer quadrant of right female breast: Secondary | ICD-10-CM

## 2021-03-28 DIAGNOSIS — K862 Cyst of pancreas: Secondary | ICD-10-CM | POA: Diagnosis not present

## 2021-03-28 DIAGNOSIS — Z8049 Family history of malignant neoplasm of other genital organs: Secondary | ICD-10-CM | POA: Diagnosis not present

## 2021-03-28 DIAGNOSIS — Z5112 Encounter for antineoplastic immunotherapy: Secondary | ICD-10-CM | POA: Diagnosis present

## 2021-03-28 LAB — COMPREHENSIVE METABOLIC PANEL
ALT: 92 U/L — ABNORMAL HIGH (ref 0–44)
AST: 34 U/L (ref 15–41)
Albumin: 3.7 g/dL (ref 3.5–5.0)
Alkaline Phosphatase: 62 U/L (ref 38–126)
Anion gap: 12 (ref 5–15)
BUN: 10 mg/dL (ref 6–20)
CO2: 19 mmol/L — ABNORMAL LOW (ref 22–32)
Calcium: 8.9 mg/dL (ref 8.9–10.3)
Chloride: 106 mmol/L (ref 98–111)
Creatinine, Ser: 0.92 mg/dL (ref 0.44–1.00)
GFR, Estimated: >60 mL/min (ref >60)
Glucose, Bld: 233 mg/dL — ABNORMAL HIGH (ref 70–99)
Potassium: 4.1 mmol/L (ref 3.5–5.1)
Sodium: 137 mmol/L (ref 135–145)
Total Bilirubin: 0.4 mg/dL (ref 0.3–1.2)
Total Protein: 7.1 g/dL (ref 6.5–8.1)

## 2021-03-28 LAB — CBC WITH DIFFERENTIAL/PLATELET
Abs Immature Granulocytes: 0.05 10*3/uL (ref 0.00–0.07)
Basophils Absolute: 0 10*3/uL (ref 0.0–0.1)
Basophils Relative: 0 %
Eosinophils Absolute: 0 10*3/uL (ref 0.0–0.5)
Eosinophils Relative: 0 %
HCT: 38.5 % (ref 36.0–46.0)
Hemoglobin: 13.3 g/dL (ref 12.0–15.0)
Immature Granulocytes: 0 %
Lymphocytes Relative: 8 %
Lymphs Abs: 0.9 10*3/uL (ref 0.7–4.0)
MCH: 30.2 pg (ref 26.0–34.0)
MCHC: 34.5 g/dL (ref 30.0–36.0)
MCV: 87.5 fL (ref 80.0–100.0)
Monocytes Absolute: 0.1 10*3/uL (ref 0.1–1.0)
Monocytes Relative: 1 %
Neutro Abs: 11.2 10*3/uL — ABNORMAL HIGH (ref 1.7–7.7)
Neutrophils Relative %: 91 %
Platelets: 325 10*3/uL (ref 150–400)
RBC: 4.4 MIL/uL (ref 3.87–5.11)
RDW: 14.5 % (ref 11.5–15.5)
WBC: 12.3 10*3/uL — ABNORMAL HIGH (ref 4.0–10.5)
nRBC: 0 % (ref 0.0–0.2)

## 2021-03-28 MED ORDER — SODIUM CHLORIDE 0.9% FLUSH
10.0000 mL | INTRAVENOUS | Status: DC | PRN
  Administered 2021-03-28: 10 mL
  Filled 2021-03-28: qty 10

## 2021-03-28 MED ORDER — ACETAMINOPHEN 325 MG PO TABS
ORAL_TABLET | ORAL | Status: AC
  Filled 2021-03-28: qty 2

## 2021-03-28 MED ORDER — SODIUM CHLORIDE 0.9 % IV SOLN
900.0000 mg | Freq: Once | INTRAVENOUS | Status: AC
  Administered 2021-03-28: 900 mg via INTRAVENOUS
  Filled 2021-03-28: qty 42.86

## 2021-03-28 MED ORDER — ACETAMINOPHEN 325 MG PO TABS
650.0000 mg | ORAL_TABLET | Freq: Once | ORAL | Status: AC
  Administered 2021-03-28: 650 mg via ORAL

## 2021-03-28 MED ORDER — DIPHENHYDRAMINE HCL 25 MG PO CAPS
25.0000 mg | ORAL_CAPSULE | Freq: Once | ORAL | Status: AC
  Administered 2021-03-28: 25 mg via ORAL

## 2021-03-28 MED ORDER — PALONOSETRON HCL INJECTION 0.25 MG/5ML
INTRAVENOUS | Status: AC
  Filled 2021-03-28: qty 5

## 2021-03-28 MED ORDER — SODIUM CHLORIDE 0.9 % IV SOLN
10.0000 mg | Freq: Once | INTRAVENOUS | Status: AC
  Administered 2021-03-28: 10 mg via INTRAVENOUS
  Filled 2021-03-28: qty 10

## 2021-03-28 MED ORDER — HEPARIN SOD (PORK) LOCK FLUSH 100 UNIT/ML IV SOLN
500.0000 [IU] | Freq: Once | INTRAVENOUS | Status: AC | PRN
  Administered 2021-03-28: 500 [IU]
  Filled 2021-03-28: qty 5

## 2021-03-28 MED ORDER — DIPHENHYDRAMINE HCL 25 MG PO CAPS
ORAL_CAPSULE | ORAL | Status: AC
  Filled 2021-03-28: qty 2

## 2021-03-28 MED ORDER — SODIUM CHLORIDE 0.9 % IV SOLN
750.0000 mg | Freq: Once | INTRAVENOUS | Status: AC
  Administered 2021-03-28: 750 mg via INTRAVENOUS
  Filled 2021-03-28: qty 75

## 2021-03-28 MED ORDER — SODIUM CHLORIDE 0.9 % IV SOLN
160.0000 mg | Freq: Once | INTRAVENOUS | Status: AC
  Administered 2021-03-28: 160 mg via INTRAVENOUS
  Filled 2021-03-28: qty 16

## 2021-03-28 MED ORDER — PALONOSETRON HCL INJECTION 0.25 MG/5ML
0.2500 mg | Freq: Once | INTRAVENOUS | Status: AC
  Administered 2021-03-28: 0.25 mg via INTRAVENOUS

## 2021-03-28 MED ORDER — FOSAPREPITANT DIMEGLUMINE INJECTION 150 MG
150.0000 mg | Freq: Once | INTRAVENOUS | Status: AC
Start: 2021-03-28 — End: 2021-03-28
  Administered 2021-03-28: 150 mg via INTRAVENOUS
  Filled 2021-03-28: qty 150

## 2021-03-28 MED ORDER — SODIUM CHLORIDE 0.9 % IV SOLN
Freq: Once | INTRAVENOUS | Status: AC
  Filled 2021-03-28: qty 250

## 2021-03-28 MED ORDER — SODIUM CHLORIDE 0.9 % IV SOLN
420.0000 mg | Freq: Once | INTRAVENOUS | Status: AC
  Administered 2021-03-28: 420 mg via INTRAVENOUS
  Filled 2021-03-28: qty 14

## 2021-03-28 MED ORDER — FAMOTIDINE IN NACL 20-0.9 MG/50ML-% IV SOLN
INTRAVENOUS | Status: AC
  Filled 2021-03-28: qty 50

## 2021-03-28 NOTE — Progress Notes (Signed)
Per Dr. Jana Hakim, ok to treat with HR 105.

## 2021-03-28 NOTE — Progress Notes (Signed)
[facility] CSW Progress Note  [employer] representative met with patient to sign up for [employer] and provide first installment. Confirmed that patient received other documents to apply for assistance through breast cancer foundations. Patient stated that she is working on applications and will call with any questions.    Christeen Douglas , LCSW

## 2021-03-28 NOTE — Patient Instructions (Signed)
[facility] Discharge Instructions for Patients Receiving Chemotherapy  Today you received the following chemotherapy agents Herceptin, Perjeta, Docetaxel, Carboplatin  To help prevent nausea and vomiting after your treatment, we encourage you to take your nausea medication as directed.    If you develop nausea and vomiting that is not controlled by your nausea medication, call the clinic.   BELOW ARE SYMPTOMS THAT SHOULD BE REPORTED IMMEDIATELY:  *FEVER GREATER THAN 100.5 F  *CHILLS WITH OR WITHOUT FEVER  NAUSEA AND VOMITING THAT IS NOT CONTROLLED WITH YOUR NAUSEA MEDICATION  *UNUSUAL SHORTNESS OF BREATH  *UNUSUAL BRUISING OR BLEEDING  TENDERNESS IN MOUTH AND THROAT WITH OR WITHOUT PRESENCE OF ULCERS  *URINARY PROBLEMS  *BOWEL PROBLEMS  UNUSUAL RASH Items with * indicate a potential emergency and should be followed up as soon as possible.  Feel free to call the clinic should you have any questions or concerns. The clinic phone number is [phone].  Please show the [place] at check-in to the Emergency Department and triage nurse.  Trastuzumab injection for infusion What is this medicine? TRASTUZUMAB (tras TOO zoo mab) is a monoclonal antibody. It is used to treat breast cancer and stomach cancer. This medicine may be used for other purposes; ask your health care provider or pharmacist if you have questions. COMMON BRAND NAME(S): Herceptin, Galvin Proffer, Trazimera What should I tell my health care provider before I take this medicine? They need to know if you have any of these conditions:  heart disease  heart failure  lung or breathing disease, like asthma  an unusual or allergic reaction to trastuzumab, benzyl alcohol, or other medications, foods, dyes, or preservatives  pregnant or trying to get pregnant  breast-feeding How should I use this medicine? This drug is given as an infusion into a vein. It is  administered in a hospital or clinic by a specially trained health care professional. Talk to your pediatrician regarding the use of this medicine in children. This medicine is not approved for use in children. Overdosage: If you think you have taken too much of this medicine contact a poison control center or emergency room at once. NOTE: This medicine is only for you. Do not share this medicine with others. What if I miss a dose? It is important not to miss a dose. Call your doctor or health care professional if you are unable to keep an appointment. What may interact with this medicine? This medicine may interact with the following medications:  certain types of chemotherapy, such as daunorubicin, doxorubicin, epirubicin, and idarubicin This list may not describe all possible interactions. Give your health care provider a list of all the medicines, herbs, non-prescription drugs, or dietary supplements you use. Also tell them if you smoke, drink alcohol, or use illegal drugs. Some items may interact with your medicine. What should I watch for while using this medicine? Visit your doctor for checks on your progress. Report any side effects. Continue your course of treatment even though you feel ill unless your doctor tells you to stop. Call your doctor or health care professional for advice if you get a fever, chills or sore throat, or other symptoms of a cold or flu. Do not treat yourself. Try to avoid being around people who are sick. You may experience fever, chills and shaking during your first infusion. These effects are usually mild and can be treated with other medicines. Report any side effects during the infusion to your health care  professional. Fever and chills usually do not happen with later infusions. Do not become pregnant while taking this medicine or for 7 months after stopping it. Women should inform their doctor if they wish to become pregnant or think they might be pregnant. Women  of child-bearing potential will need to have a negative pregnancy test before starting this medicine. There is a potential for serious side effects to an unborn child. Talk to your health care professional or pharmacist for more information. Do not breast-feed an infant while taking this medicine or for 7 months after stopping it. Women must use effective birth control with this medicine. What side effects may I notice from receiving this medicine? Side effects that you should report to your doctor or health care professional as soon as possible:  allergic reactions like skin rash, itching or hives, swelling of the face, lips, or tongue  chest pain or palpitations  cough  dizziness  feeling faint or lightheaded, falls  fever  general ill feeling or flu-like symptoms  signs of worsening heart failure like breathing problems; swelling in your legs and feet  unusually weak or tired Side effects that usually do not require medical attention (report to your doctor or health care professional if they continue or are bothersome):  bone pain  changes in taste  diarrhea  joint pain  nausea/vomiting  weight loss This list may not describe all possible side effects. Call your doctor for medical advice about side effects. You may report side effects to FDA at 1-800-FDA-1088. Where should I keep my medicine? This drug is given in a hospital or clinic and will not be stored at home. NOTE: This sheet is a summary. It may not cover all possible information. If you have questions about this medicine, talk to your doctor, pharmacist, or health care provider.  2021 Elsevier/Gold Standard (2016-11-26 14:37:52)  Pertuzumab injection What is this medicine? PERTUZUMAB (per TOOZ ue mab) is a monoclonal antibody. It is used to treat breast cancer. This medicine may be used for other purposes; ask your health care provider or pharmacist if you have questions. COMMON BRAND NAME(S): PERJETA What  should I tell my health care provider before I take this medicine? They need to know if you have any of these conditions:  heart disease  heart failure  high blood pressure  history of irregular heart beat  recent or ongoing radiation therapy  an unusual or allergic reaction to pertuzumab, other medicines, foods, dyes, or preservatives  pregnant or trying to get pregnant  breast-feeding How should I use this medicine? This medicine is for infusion into a vein. It is given by a health care professional in a hospital or clinic setting. Talk to your pediatrician regarding the use of this medicine in children. Special care may be needed. Overdosage: If you think you have taken too much of this medicine contact a poison control center or emergency room at once. NOTE: This medicine is only for you. Do not share this medicine with others. What if I miss a dose? It is important not to miss your dose. Call your doctor or health care professional if you are unable to keep an appointment. What may interact with this medicine? Interactions are not expected. Give your health care provider a list of all the medicines, herbs, non-prescription drugs, or dietary supplements you use. Also tell them if you smoke, drink alcohol, or use illegal drugs. Some items may interact with your medicine. This list may not describe all possible  interactions. Give your health care provider a list of all the medicines, herbs, non-prescription drugs, or dietary supplements you use. Also tell them if you smoke, drink alcohol, or use illegal drugs. Some items may interact with your medicine. What should I watch for while using this medicine? Your condition will be monitored carefully while you are receiving this medicine. Report any side effects. Continue your course of treatment even though you feel ill unless your doctor tells you to stop. Do not become pregnant while taking this medicine or for 7 months after stopping  it. Women should inform their doctor if they wish to become pregnant or think they might be pregnant. Women of child-bearing potential will need to have a negative pregnancy test before starting this medicine. There is a potential for serious side effects to an unborn child. Talk to your health care professional or pharmacist for more information. Do not breast-feed an infant while taking this medicine or for 7 months after stopping it. Women must use effective birth control with this medicine. Call your doctor or health care professional for advice if you get a fever, chills or sore throat, or other symptoms of a cold or flu. Do not treat yourself. Try to avoid being around people who are sick. You may experience fever, chills, and headache during the infusion. Report any side effects during the infusion to your health care professional. What side effects may I notice from receiving this medicine? Side effects that you should report to your doctor or health care professional as soon as possible:  breathing problems  chest pain or palpitations  dizziness  feeling faint or lightheaded  fever or chills  skin rash, itching or hives  sore throat  swelling of the face, lips, or tongue  swelling of the legs or ankles  unusually weak or tired Side effects that usually do not require medical attention (report to your doctor or health care professional if they continue or are bothersome):  diarrhea  hair loss  nausea, vomiting  tiredness This list may not describe all possible side effects. Call your doctor for medical advice about side effects. You may report side effects to FDA at 1-800-FDA-1088. Where should I keep my medicine? This drug is given in a hospital or clinic and will not be stored at home. NOTE: This sheet is a summary. It may not cover all possible information. If you have questions about this medicine, talk to your doctor, pharmacist, or health care provider.  2021  Elsevier/Gold Standard (2016-01-04 12:08:50)  Docetaxel injection What is this medicine? DOCETAXEL (doe se TAX el) is a chemotherapy drug. It targets fast dividing cells, like cancer cells, and causes these cells to die. This medicine is used to treat many types of cancers like breast cancer, certain stomach cancers, head and neck cancer, lung cancer, and prostate cancer. This medicine may be used for other purposes; ask your health care provider or pharmacist if you have questions. COMMON BRAND NAME(S): Docefrez, Taxotere What should I tell my health care provider before I take this medicine? They need to know if you have any of these conditions:  infection (especially a virus infection such as chickenpox, cold sores, or herpes)  liver disease  low blood counts, like low white cell, platelet, or red cell counts  an unusual or allergic reaction to docetaxel, polysorbate 80, other chemotherapy agents, other medicines, foods, dyes, or preservatives  pregnant or trying to get pregnant  breast-feeding How should I use this medicine? This drug  is given as an infusion into a vein. It is administered in a hospital or clinic by a specially trained health care professional. Talk to your pediatrician regarding the use of this medicine in children. Special care may be needed. Overdosage: If you think you have taken too much of this medicine contact a poison control center or emergency room at once. NOTE: This medicine is only for you. Do not share this medicine with others. What if I miss a dose? It is important not to miss your dose. Call your doctor or health care professional if you are unable to keep an appointment. What may interact with this medicine? Do not take this medicine with any of the following medications:  live virus vaccines This medicine may also interact with the following medications:  aprepitant  certain antibiotics like erythromycin or clarithromycin  certain  antivirals for HIV or hepatitis  certain medicines for fungal infections like fluconazole, itraconazole, ketoconazole, posaconazole, or voriconazole  cimetidine  ciprofloxacin  conivaptan  cyclosporine  dronedarone  fluvoxamine  grapefruit juice  imatinib  verapamil This list may not describe all possible interactions. Give your health care provider a list of all the medicines, herbs, non-prescription drugs, or dietary supplements you use. Also tell them if you smoke, drink alcohol, or use illegal drugs. Some items may interact with your medicine. What should I watch for while using this medicine? Your condition will be monitored carefully while you are receiving this medicine. You will need important blood work done while you are taking this medicine. Call your doctor or health care professional for advice if you get a fever, chills or sore throat, or other symptoms of a cold or flu. Do not treat yourself. This drug decreases your body's ability to fight infections. Try to avoid being around people who are sick. Some products may contain alcohol. Ask your health care professional if this medicine contains alcohol. Be sure to tell all health care professionals you are taking this medicine. Certain medicines, like metronidazole and disulfiram, can cause an unpleasant reaction when taken with alcohol. The reaction includes flushing, headache, nausea, vomiting, sweating, and increased thirst. The reaction can last from 30 minutes to several hours. You may get drowsy or dizzy. Do not drive, use machinery, or do anything that needs mental alertness until you know how this medicine affects you. Do not stand or sit up quickly, especially if you are an older patient. This reduces the risk of dizzy or fainting spells. Alcohol may interfere with the effect of this medicine. Talk to your health care professional about your risk of cancer. You may be more at risk for certain types of cancer if you  take this medicine. Do not become pregnant while taking this medicine or for 6 months after stopping it. Women should inform their doctor if they wish to become pregnant or think they might be pregnant. There is a potential for serious side effects to an unborn child. Talk to your health care professional or pharmacist for more information. Do not breast-feed an infant while taking this medicine or for 1 week after stopping it. Males who get this medicine must use a condom during sex with females who can get pregnant. If you get a woman pregnant, the baby could have birth defects. The baby could die before they are born. You will need to continue wearing a condom for 3 months after stopping the medicine. Tell your health care provider right away if your partner becomes pregnant while you are  taking this medicine. This may interfere with the ability to father a child. You should talk to your doctor or health care professional if you are concerned about your fertility. What side effects may I notice from receiving this medicine? Side effects that you should report to your doctor or health care professional as soon as possible:  allergic reactions like skin rash, itching or hives, swelling of the face, lips, or tongue  blurred vision  breathing problems  changes in vision  low blood counts - This drug may decrease the number of white blood cells, red blood cells and platelets. You may be at increased risk for infections and bleeding.  nausea and vomiting  pain, redness or irritation at site where injected  pain, tingling, numbness in the hands or feet  redness, blistering, peeling, or loosening of the skin, including inside the mouth  signs of decreased platelets or bleeding - bruising, pinpoint red spots on the skin, black, tarry stools, nosebleeds  signs of decreased red blood cells - unusually weak or tired, fainting spells, lightheadedness  signs of infection - fever or chills, cough,  sore throat, pain or difficulty passing urine  swelling of the ankle, feet, hands Side effects that usually do not require medical attention (report to your doctor or health care professional if they continue or are bothersome):  constipation  diarrhea  fingernail or toenail changes  hair loss  loss of appetite  mouth sores  muscle pain This list may not describe all possible side effects. Call your doctor for medical advice about side effects. You may report side effects to FDA at 1-800-FDA-1088. Where should I keep my medicine? This drug is given in a hospital or clinic and will not be stored at home. NOTE: This sheet is a summary. It may not cover all possible information. If you have questions about this medicine, talk to your doctor, pharmacist, or health care provider.  2021 Elsevier/Gold Standard (2019-11-01 19:50:31)  Carboplatin injection What is this medicine? CARBOPLATIN (KAR boe pla tin) is a chemotherapy drug. It targets fast dividing cells, like cancer cells, and causes these cells to die. This medicine is used to treat ovarian cancer and many other cancers. This medicine may be used for other purposes; ask your health care provider or pharmacist if you have questions. COMMON BRAND NAME(S): Paraplatin What should I tell my health care provider before I take this medicine? They need to know if you have any of these conditions:  blood disorders  hearing problems  kidney disease  recent or ongoing radiation therapy  an unusual or allergic reaction to carboplatin, cisplatin, other chemotherapy, other medicines, foods, dyes, or preservatives  pregnant or trying to get pregnant  breast-feeding How should I use this medicine? This drug is usually given as an infusion into a vein. It is administered in a hospital or clinic by a specially trained health care professional. Talk to your pediatrician regarding the use of this medicine in children. Special care may be  needed. Overdosage: If you think you have taken too much of this medicine contact a poison control center or emergency room at once. NOTE: This medicine is only for you. Do not share this medicine with others. What if I miss a dose? It is important not to miss a dose. Call your doctor or health care professional if you are unable to keep an appointment. What may interact with this medicine?  medicines for seizures  medicines to increase blood counts like filgrastim, pegfilgrastim, sargramostim  some antibiotics like amikacin, gentamicin, neomycin, streptomycin, tobramycin  vaccines Talk to your doctor or health care professional before taking any of these medicines:  acetaminophen  aspirin  ibuprofen  ketoprofen  naproxen This list may not describe all possible interactions. Give your health care provider a list of all the medicines, herbs, non-prescription drugs, or dietary supplements you use. Also tell them if you smoke, drink alcohol, or use illegal drugs. Some items may interact with your medicine. What should I watch for while using this medicine? Your condition will be monitored carefully while you are receiving this medicine. You will need important blood work done while you are taking this medicine. This drug may make you feel generally unwell. This is not uncommon, as chemotherapy can affect healthy cells as well as cancer cells. Report any side effects. Continue your course of treatment even though you feel ill unless your doctor tells you to stop. In some cases, you may be given additional medicines to help with side effects. Follow all directions for their use. Call your doctor or health care professional for advice if you get a fever, chills or sore throat, or other symptoms of a cold or flu. Do not treat yourself. This drug decreases your body's ability to fight infections. Try to avoid being around people who are sick. This medicine may increase your risk to bruise or  bleed. Call your doctor or health care professional if you notice any unusual bleeding. Be careful brushing and flossing your teeth or using a toothpick because you may get an infection or bleed more easily. If you have any dental work done, tell your dentist you are receiving this medicine. Avoid taking products that contain aspirin, acetaminophen, ibuprofen, naproxen, or ketoprofen unless instructed by your doctor. These medicines may hide a fever. Do not become pregnant while taking this medicine. Women should inform their doctor if they wish to become pregnant or think they might be pregnant. There is a potential for serious side effects to an unborn child. Talk to your health care professional or pharmacist for more information. Do not breast-feed an infant while taking this medicine. What side effects may I notice from receiving this medicine? Side effects that you should report to your doctor or health care professional as soon as possible:  allergic reactions like skin rash, itching or hives, swelling of the face, lips, or tongue  signs of infection - fever or chills, cough, sore throat, pain or difficulty passing urine  signs of decreased platelets or bleeding - bruising, pinpoint red spots on the skin, black, tarry stools, nosebleeds  signs of decreased red blood cells - unusually weak or tired, fainting spells, lightheadedness  breathing problems  changes in hearing  changes in vision  chest pain  high blood pressure  low blood counts - This drug may decrease the number of white blood cells, red blood cells and platelets. You may be at increased risk for infections and bleeding.  nausea and vomiting  pain, swelling, redness or irritation at the injection site  pain, tingling, numbness in the hands or feet  problems with balance, talking, walking  trouble passing urine or change in the amount of urine Side effects that usually do not require medical attention (report to  your doctor or health care professional if they continue or are bothersome):  hair loss  loss of appetite  metallic taste in the mouth or changes in taste This list may not describe all possible side effects. Call your doctor for  medical advice about side effects. You may report side effects to FDA at 1-800-FDA-1088. Where should I keep my medicine? This drug is given in a hospital or clinic and will not be stored at home. NOTE: This sheet is a summary. It may not cover all possible information. If you have questions about this medicine, talk to your doctor, pharmacist, or health care provider.  2021 Elsevier/Gold Standard (2008-03-08 14:38:05)

## 2021-03-29 ENCOUNTER — Telehealth: Payer: Self-pay | Admitting: *Deleted

## 2021-03-30 ENCOUNTER — Other Ambulatory Visit: Payer: Self-pay

## 2021-03-30 ENCOUNTER — Inpatient Hospital Stay: Payer: No Typology Code available for payment source

## 2021-03-30 VITALS — BP 139/79 | HR 106 | Temp 99.0°F | Resp 18

## 2021-03-30 DIAGNOSIS — C50411 Malignant neoplasm of upper-outer quadrant of right female breast: Secondary | ICD-10-CM

## 2021-03-30 DIAGNOSIS — Z5112 Encounter for antineoplastic immunotherapy: Secondary | ICD-10-CM | POA: Diagnosis not present

## 2021-03-30 DIAGNOSIS — Z170 Estrogen receptor positive status [ER+]: Secondary | ICD-10-CM

## 2021-03-30 MED ORDER — PEGFILGRASTIM-BMEZ 6 MG/0.6ML ~~LOC~~ SOSY
PREFILLED_SYRINGE | SUBCUTANEOUS | Status: AC
  Filled 2021-03-30: qty 0.6

## 2021-03-30 MED ORDER — PEGFILGRASTIM-BMEZ 6 MG/0.6ML ~~LOC~~ SOSY
6.0000 mg | PREFILLED_SYRINGE | Freq: Once | SUBCUTANEOUS | Status: AC
  Administered 2021-03-30: 6 mg via SUBCUTANEOUS

## 2021-04-03 NOTE — Progress Notes (Signed)
Diane Miranda  [phone] [phone]     ID: Diane Miranda DOB: 28-Dec-1975  MR#: [id number]  AVW#:[id number]  Patient Care Team: [name], MD as PCP - General (Internal Medicine) Mauro Kaufmann, RN as Oncology Nurse Navigator Rockwell Germany, RN as Oncology Nurse Navigator Stark Klein, MD as Consulting Physician (General Surgery) Adolf Ormiston, Virgie Dad, MD as Consulting Physician (Oncology) Gery Pray, MD as Consulting Physician (Radiation Oncology) Wylene Simmer, MD as Consulting Physician (Orthopedic Surgery) Chauncey Cruel, MD OTHER MD:  CHIEF COMPLAINT: triple positive breast cancer  CURRENT TREATMENT: Neoadjuvant chemotherapy   INTERVAL HISTORY: Diane Miranda returns today for follow up and treatment of her triple positive right breast cancer.  She is accompanied by her Diane Miranda.  She underwent genetic testing during clinic. Results were negative.  Since her last visit, she underwent bone scan on 03/23/2021 showing: no evidence of bony metastatic disease.  She began neoadjuvant chemotherapy, consisting of carboplatin, docetaxel, trastuzumab and pertuzumab every 21 days x 6, on 03/28/2021.  Today is day 8 cycle 1   REVIEW OF SYSTEMS: Diane Miranda did generally well with her treatment.  When she got the immune booster shot on day 3 she did develop significant leg pain which was intense.  This lasted for about 3 days.  She took Claritin and Tylenol for this and that was really not quite adequate although she did get through it.  She did not have malaise or other flulike symptoms.  She had mild mouth sores and a little bit of tingling in her tongue and some angular cheilitis.  The diarrhea started on day 5.  She had about 5 bowel movements that day.  She did not start the Imodium until the third bowel movement.  The next day she started the Imodium immediately with the first bowel movement and she only had 2 bowel movements that  was yesterday.  She has had no bowel movements so far today.  Aside from these issues a detailed review of systems was stable   COVID 19 VACCINATION STATUS: refuses vaccination; infection 10/2019   HISTORY OF CURRENT ILLNESS: From the original intake note:  Diane Miranda herself palpated a right breast mass and noted associated pain. She underwent bilateral diagnostic mammography with tomography and right breast ultrasonography at [facility] on 02/15/2021 showing: breast density category B; palpable 5.4 cm mass within outer right breast; two abnormal right axillary lymph nodes; 1.8 cm indeterminate hypoechoic area within upper-outer right breast; no evidence of left breast malignancy.  Accordingly on 02/23/2021 she proceeded to biopsy of the right breast areas in question. The pathology from this procedure ([id number]) showed:  1. Right Breast, 11 o'clock  - adenosis and columnar cell hyperplasia; this was concordant 2. Right Breast, 9 o'clock  - invasive ductal carcinoma, grade 3  - Prognostic indicators significant for: estrogen receptor, 40% positive with moderate staining intensity and progesterone receptor, 60% positive with strong staining intensity. Proliferation marker Ki67 at 35%. HER2 positive by immunohistochemistry (3+). 3. Right Axilla, lymph node  - invasive ductal carcinoma involving nodal tissue  She underwent breast MRI on 03/04/2021 showing: breast composition B; 4.7 cm mass in 8-9 o'clock position of posterior right breast represents recently diagnosed malignancy; two adjacent abnormal right axillary lymph nodes, one of which represents the biopsy-proven metastatic node; significant skin thickening in periareolar right breast, with significant increased T2 signal consistent with edema; mild abnormal enhancement within thickened skin, nonspecific.  There was also a 1.1  cm nonspecific enhancing mass in left breast at 2-3 o'clock.  Cancer Staging Malignant neoplasm of  upper-outer quadrant of right breast in female, estrogen receptor positive ([place]) Staging form: Breast, AJCC 8th Edition - Clinical: Stage IIB (cT3, cN1, cM0, G3, ER+, PR+, HER2+) - Signed by Chauncey Cruel, MD on 03/07/2021 Histologic grading system: 3 grade system  The patient's subsequent history is as detailed below.   PAST MEDICAL HISTORY: Past Medical History:  Diagnosis Date  . Allergy 2000  . Asthma    as child  . Family history of lung cancer 03/08/2021  . Family history of uterine cancer 03/08/2021  . Headache   . Hypertension   . Prediabetes   . right breast ca dx'd 03/10/21  . Trimalleolar fracture of ankle, closed, left, initial encounter     PAST SURGICAL HISTORY: Past Surgical History:  Procedure Laterality Date  . BREAST BIOPSY Right 03/22/2021   Procedure: PUNCH BIOPSY OF RIGHT AREOLA;  Surgeon: Stark Klein, MD;  Location: [facility];  Service: General;  Laterality: Right;  . FRACTURE SURGERY  2018  . NO PAST SURGERIES    . ORIF ANKLE FRACTURE Left 07/10/2017   Procedure: OPEN REDUCTION INTERNAL FIXATION (ORIF) trimallolar ANKLE FRACTURE;  Surgeon: Wylene Simmer, MD;  Location: [facility];  Service: Orthopedics;  Laterality: Left;  . PORTACATH PLACEMENT Left 03/22/2021   Procedure: INSERTION PORT-A-CATH;  Surgeon: Stark Klein, MD;  Location: [facility];  Service: General;  Laterality: Left;    FAMILY HISTORY: Family History  Problem Relation Age of Onset  . Diabetes Father   . Hypertension Father   . Early death Mother   . Heart disease Mother   . Hypertension Mother   . Hearing loss Maternal Grandmother   . Hypertension Maternal Grandmother   . Uterine cancer Other 38       MGM's sister  . Throat cancer Other        MGM's brother; dx after 61  . Lung cancer Other        PGM's sister; dx 72s   Her father is living at age 78 as of 03-10-21. Her mother died at age 101 from [place]. [name] has two brothers (and no  sisters). She reports endometrial cancer in a maternal great aunt and lung cancer in a paternal aunt. There is no other family history of cancer to her knowledge.   GYNECOLOGIC HISTORY:  Patient's last menstrual period was 03-10-21. Menarche: 45 years old Age at first live birth: 45 years old [place] P 2 LMP early March 10, 2021 (has since had IUD placed) Contraceptive: IUD in place [place]), previously used pills from 1996-2006 HRT n/a  Hysterectomy? no BSO? 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 She is single. She lives at home with daughter Joylene Draft (age 37), adopted daughter Hetty Blend (age 59), and the patient's maternal grandmother, who has significant dementia issues. Daughter Levy Pupa, age 90, works in Press photographer in [place]. Loida is a Restaurant manager, fast food.    ADVANCED DIRECTIVES: not in place; however the patient made it clear during her 03/07/2021 visit that she would not accept blood product transfusions for any reason including immediately life saving reasons   HEALTH MAINTENANCE: Social History   Tobacco Use  . Smoking status: Never Smoker  . Smokeless tobacco: Never Used  Vaping Use  . Vaping Use: Never used  Substance Use Topics  . Alcohol use: Yes  Alcohol/week: 0.0 standard drinks    Comment: social  . Drug use: No     Colonoscopy: n/a (age)  PAP: 2020  Bone density: n/a (age)   No Known Allergies  Current Outpatient Medications  Medication Sig Dispense Refill  . acetaminophen (TYLENOL) 500 MG tablet Take 1 tablet (500 mg total) by mouth 3 (three) times daily with meals as needed. Take with aleve 220 mg tablet 90 tablet 0  . naproxen sodium (ALEVE) 220 MG tablet Take 1 tablet (220 mg total) by mouth 3 (three) times daily with meals as needed. Take with tylenol 500 mg tablet 90 tablet 0  . valACYclovir (VALTREX) 1000 MG tablet Take 1 tablet (1,000 mg total) by mouth daily. 60 tablet  1  . amLODipine (NORVASC) 10 MG tablet Take 10 mg by mouth daily.    Marland Kitchen dexamethasone (DECADRON) 4 MG tablet Take 2 tablets (8 mg total) by mouth 2 (two) times daily. Start the day before Taxotere. Then take daily x 3 days after chemotherapy. 30 tablet 1  . Fexofenadine HCl (ALLEGRA ALLERGY PO) Take by mouth.    . lidocaine-prilocaine (EMLA) cream Apply to affected area once 30 g 3  . loratadine (CLARITIN) 10 MG tablet Take 1 tablet (10 mg total) by mouth daily. 90 tablet 4  . Multiple Vitamins-Minerals (MULTIVITAMIN WITH MINERALS) tablet Take 1 tablet by mouth daily.    . ondansetron (ZOFRAN) 8 MG tablet Take 1 tablet (8 mg total) by mouth 2 (two) times daily as needed (Nausea or vomiting). Start on the third day after chemotherapy. 30 tablet 1  . oxyCODONE (OXY IR/ROXICODONE) 5 MG immediate release tablet Take 1 tablet (5 mg total) by mouth every 6 (six) hours as needed for severe pain. 5 tablet 0  . prochlorperazine (COMPAZINE) 10 MG tablet Take 1 tablet (10 mg total) by mouth every 6 (six) hours as needed (Nausea or vomiting). 30 tablet 1  . Turmeric (QC TUMERIC COMPLEX PO) Take by mouth.     No current facility-administered medications for this visit.    OBJECTIVE: African-American woman who appears well  Vitals:   04/04/21 0851  BP: 130/77  Pulse: (!) 105  Resp: 20  Temp: 97.7 F (36.5 C)  SpO2: 100%     Body mass index is 40.23 kg/m.   Wt Readings from Last 3 Encounters:  04/04/21 234 lb 6.4 oz (106.3 kg)  03/28/21 236 lb (107 kg)  03/22/21 249 lb 14.4 oz (113.4 kg)      ECOG FS:1 - Symptomatic but completely ambulatory  Sclerae unicteric, EOMs intact Wearing a mask No cervical or supraclavicular adenopathy Lungs no rales or rhonchi Heart regular rate and rhythm Abd soft, obese, nontender, positive bowel sounds MSK no focal spinal tenderness, no upper extremity lymphedema Neuro: nonfocal, well oriented, appropriate affect Breasts: The right breast mass is easily  palpable feeling from underneath at the 7 o'clock position.  It is movable.  There is no skin or nipple involvement.   LAB RESULTS:  CMP     Component Value Date/Time   NA 137 03/28/2021 0756   K 4.1 03/28/2021 0756   CL 106 03/28/2021 0756   CO2 19 (L) 03/28/2021 0756   GLUCOSE 233 (H) 03/28/2021 0756   GLUCOSE 105 (H) 11/12/2006 1022   BUN 10 03/28/2021 0756   CREATININE 0.92 03/28/2021 0756   CREATININE 0.92 03/07/2021 1219   CALCIUM 8.9 03/28/2021 0756   PROT 7.1 03/28/2021 0756   ALBUMIN 3.7 03/28/2021 0756  AST 34 03/28/2021 0756   AST 13 (L) 03/07/2021 1219   ALT 92 (H) 03/28/2021 0756   ALT 16 03/07/2021 1219   ALKPHOS 62 03/28/2021 0756   BILITOT 0.4 03/28/2021 0756   BILITOT 0.6 03/07/2021 1219   GFRNONAA >60 03/28/2021 0756   GFRNONAA >60 03/07/2021 1219   GFRAA >90 01/30/2015 1319    No results found for: Ronnald Ramp, A1GS, A2GS, BETS, BETA2SER, GAMS, MSPIKE, SPEI  Lab Results  Component Value Date   WBC 5.7 04/04/2021   NEUTROABS 3.0 04/04/2021   HGB 12.9 04/04/2021   HCT 37.6 04/04/2021   MCV 88.3 04/04/2021   PLT 256 04/04/2021    No results found for: LABCA2  No components found for: IZTIWP809  No results for input(s): INR in the last 168 hours.  No results found for: LABCA2  No results found for: XIP382  No results found for: NKN397  No results found for: QBH419  No results found for: CA2729  No components found for: HGQUANT  No results found for: CEA1 / No results found for: CEA1   No results found for: AFPTUMOR  No results found for: CHROMOGRNA  No results found for: KPAFRELGTCHN, LAMBDASER, KAPLAMBRATIO (kappa/lambda light chains)  No results found for: HGBA, HGBA2QUANT, HGBFQUANT, HGBSQUAN (Hemoglobinopathy evaluation)   No results found for: LDH  No results found for: IRON, TIBC, IRONPCTSAT (Iron and TIBC)  No results found for: FERRITIN  Urinalysis    Component Value Date/Time   COLORURINE YELLOW  01/30/2015 Lillie 01/30/2015 1343   LABSPEC 1.011 01/30/2015 1343   PHURINE 7.5 01/30/2015 1343   GLUCOSEU NEGATIVE 01/30/2015 1343   HGBUR NEGATIVE 01/30/2015 1343   BILIRUBINUR NEGATIVE 01/30/2015 1343   KETONESUR NEGATIVE 01/30/2015 1343   PROTEINUR NEGATIVE 01/30/2015 1343   UROBILINOGEN 0.2 01/30/2015 1343   NITRITE NEGATIVE 01/30/2015 1343   LEUKOCYTESUR SMALL (A) 01/30/2015 1343    STUDIES: CT Chest W Contrast  Result Date: 03/21/2021 CLINICAL DATA:  Pretreatment staging for right breast cancer with right axillary lymph node involvement EXAM: CT CHEST, ABDOMEN, AND PELVIS WITH CONTRAST TECHNIQUE: Multidetector CT imaging of the chest, abdomen and pelvis was performed following the standard protocol during bolus administration of intravenous contrast. CONTRAST:  133m OMNIPAQUE IOHEXOL 300 MG/ML  SOLN COMPARISON:  None. FINDINGS: CT CHEST FINDINGS Cardiovascular: No thoracic aortic aneurysm. No central pulmonary embolus. Normal size heart. No significant pericardial effusion/thickening. Mediastinum/Nodes: Enlarged lobular RIGHT axillary lymph nodes measuring up to 1.7 cm on image 19/2. No LEFT axillary adenopathy Lungs/Pleura: No suspicious pulmonary nodules or masses. No pleural effusion. No pneumothorax. Musculoskeletal: Lobular enhancing 4.4 x 3.7 cm RIGHT breast mass consistent with known diagnosis of breast cancer. Oval focus of gas and biopsy marker in the LEFT breast, sequela of recent vacuum assisted biopsy. No aggressive lytic or blastic lesions of bone. CT ABDOMEN PELVIS FINDINGS Hepatobiliary: Hyperdense 4 mm lesion in the right lobe of the liver on image 54/2 which is too small to accurately characterize. Gallbladder is unremarkable. No biliary ductal dilation. Pancreas: Cystic 1.7 x 1.7 x 2.5 cm mass in the head of the pancreas on image 70/2 and 65/4. No pancreatic ductal dilation. No evidence of acute inflammation. Spleen: Normal in size without focal  abnormality. Adrenals/Urinary Tract: Adrenal glands are unremarkable. Kidneys are normal, without renal calculi, focal lesion, or hydronephrosis. Bladder is unremarkable for degree of distension. Stomach/Bowel: Radiopaque orally ingested contrast traverses the rectum. Stomach is within normal limits. Appendix appears normal. No evidence  of bowel wall thickening, distention, or inflammatory changes. Vascular/Lymphatic: No significant vascular findings are present. No enlarged abdominal or pelvic lymph nodes. Reproductive: Intrauterine device in place. Lobular uterine contour commonly related to uterine leiomyomas. No suspicious adnexal masses. Other: No abdominopelvic ascites. Musculoskeletal: No acute or significant osseous findings. IMPRESSION: 1. Lobular enhancing 4.4 cm RIGHT breast mass consistent with known diagnosis of breast cancer. 2. Enlarged lobular RIGHT axillary lymph nodes measuring up to 1.7 cm, compatible with disease involvement. 3. Cystic 1.7 x 1.7 x 2.5 cm mass in the head of the pancreas, nonspecific and incompletely evaluated, recommend further characterization with pancreas protocol MRI with and without contrast. 4. Hyperdense 4 mm lesion in the right lobe of the liver which is too small to accurately characterize. Attention on follow-up studies is recommended. 5. Intrauterine device in place with lobular uterine contour commonly related to uterine leiomyomas. These results will be called to the ordering clinician or representative by the Radiologist Assistant, and communication documented in the PACS or [employer]. Electronically Signed   By: Dahlia Bailiff MD   On: 03/21/2021 08:47   NM Bone Scan Whole Body  Result Date: 03/25/2021 CLINICAL DATA:  Right breast cancer staging. Recent dental work. Left ankle plates and screws from previous fracture. EXAM: NUCLEAR MEDICINE WHOLE BODY BONE SCAN TECHNIQUE: Whole body anterior and posterior images were obtained approximately 3 hours after  intravenous injection of radiopharmaceutical. RADIOPHARMACEUTICALS:  20.1 mCi Technetium-39mMDP IV COMPARISON:  Chest, abdomen and pelvis CT dated 03/20/2021 FINDINGS: Multiple foci of increased tracer uptake in the maxilla, compatible with the history of recent dental work. Increased tracer activity in the left ankle and both proximal feet, compatible with degenerative/posttraumatic changes. Increased tracer activity in the right breast, including the skin. Corresponding mass and skin thickening on the recent CT associated with the patient's recently diagnosed breast cancer. Normal renal and bladder activity. IMPRESSION: 1. No evidence of bony metastatic disease. 2. Changes in the right breast compatible with the patient's recently diagnosed breast cancer. Electronically Signed   By: SClaudie ReveringM.D.   On: 03/25/2021 23:45   CT Abdomen Pelvis W Contrast  Result Date: 03/21/2021 CLINICAL DATA:  Pretreatment staging for right breast cancer with right axillary lymph node involvement EXAM: CT CHEST, ABDOMEN, AND PELVIS WITH CONTRAST TECHNIQUE: Multidetector CT imaging of the chest, abdomen and pelvis was performed following the standard protocol during bolus administration of intravenous contrast. CONTRAST:  1051mOMNIPAQUE IOHEXOL 300 MG/ML  SOLN COMPARISON:  None. FINDINGS: CT CHEST FINDINGS Cardiovascular: No thoracic aortic aneurysm. No central pulmonary embolus. Normal size heart. No significant pericardial effusion/thickening. Mediastinum/Nodes: Enlarged lobular RIGHT axillary lymph nodes measuring up to 1.7 cm on image 19/2. No LEFT axillary adenopathy Lungs/Pleura: No suspicious pulmonary nodules or masses. No pleural effusion. No pneumothorax. Musculoskeletal: Lobular enhancing 4.4 x 3.7 cm RIGHT breast mass consistent with known diagnosis of breast cancer. Oval focus of gas and biopsy marker in the LEFT breast, sequela of recent vacuum assisted biopsy. No aggressive lytic or blastic lesions of bone. CT  ABDOMEN PELVIS FINDINGS Hepatobiliary: Hyperdense 4 mm lesion in the right lobe of the liver on image 54/2 which is too small to accurately characterize. Gallbladder is unremarkable. No biliary ductal dilation. Pancreas: Cystic 1.7 x 1.7 x 2.5 cm mass in the head of the pancreas on image 70/2 and 65/4. No pancreatic ductal dilation. No evidence of acute inflammation. Spleen: Normal in size without focal abnormality. Adrenals/Urinary Tract: Adrenal glands are unremarkable. Kidneys are normal,  without renal calculi, focal lesion, or hydronephrosis. Bladder is unremarkable for degree of distension. Stomach/Bowel: Radiopaque orally ingested contrast traverses the rectum. Stomach is within normal limits. Appendix appears normal. No evidence of bowel wall thickening, distention, or inflammatory changes. Vascular/Lymphatic: No significant vascular findings are present. No enlarged abdominal or pelvic lymph nodes. Reproductive: Intrauterine device in place. Lobular uterine contour commonly related to uterine leiomyomas. No suspicious adnexal masses. Other: No abdominopelvic ascites. Musculoskeletal: No acute or significant osseous findings. IMPRESSION: 1. Lobular enhancing 4.4 cm RIGHT breast mass consistent with known diagnosis of breast cancer. 2. Enlarged lobular RIGHT axillary lymph nodes measuring up to 1.7 cm, compatible with disease involvement. 3. Cystic 1.7 x 1.7 x 2.5 cm mass in the head of the pancreas, nonspecific and incompletely evaluated, recommend further characterization with pancreas protocol MRI with and without contrast. 4. Hyperdense 4 mm lesion in the right lobe of the liver which is too small to accurately characterize. Attention on follow-up studies is recommended. 5. Intrauterine device in place with lobular uterine contour commonly related to uterine leiomyomas. These results will be called to the ordering clinician or representative by the Radiologist Assistant, and communication documented in the  PACS or [employer]. Electronically Signed   By: Dahlia Bailiff MD   On: 03/21/2021 08:47   DG CHEST PORT 1 VIEW  Result Date: 03/22/2021 CLINICAL DATA:  45 year old female status post Port-A-Cath placement. EXAM: PORTABLE CHEST - 1 VIEW COMPARISON:  None. FINDINGS: The mediastinal contours are within normal limits. No cardiomegaly. Left subclavian port in place with catheter tip terminating at the cavoatrial junction. The lungs are clear bilaterally without evidence of focal consolidation, pleural effusion, or pneumothorax. No acute osseous abnormality. IMPRESSION: Status post left subclavian Port-A-Cath placement without evidence of pneumothorax. Electronically Signed   By: Ruthann Cancer MD   On: 03/22/2021 09:52   DG Fluoro Guide CV Line-No Report  Result Date: 03/22/2021 Fluoroscopy was utilized by the requesting physician.  No radiographic interpretation.   ECHOCARDIOGRAM COMPLETE  Result Date: 03/13/2021    ECHOCARDIOGRAM REPORT   Patient Name:   OCTA UPLINGER Date of Exam: 03/13/2021 Medical Rec #:  [id number]           Height:       64.0 in Accession #:    [id number]          Weight:       239.4 lb Date of Birth:  [birth year]           BSA:          2.112 m Patient Age:    38 years            BP:           157/83 mmHg Patient Gender: F                   HR:           96 bpm. Exam Location:  Outpatient Procedure: 2D Echo, 3D Echo, Cardiac Doppler, Color Doppler and Strain Analysis Indications:    Z51.11 Encounter for antineoplastic chemotheraphy  History:        Patient has no prior history of Echocardiogram examinations.                 Risk Factors:Hypertension. Breast cancer.  Sonographer:    Roseanna Rainbow Referring Phys: [phone] Aneliese Beaudry C Soriyah Osberg  Sonographer Comments: Technically difficult study due to poor echo windows, patient is morbidly obese and suboptimal parasternal window.  Image acquisition challenging due to patient body habitus. Global longitudinal strain was attempted. IMPRESSIONS   1. Left ventricular ejection fraction, by estimation, is 60 to 65%. The left ventricle has normal function. The left ventricle has no regional wall motion abnormalities. Left ventricular diastolic parameters were normal. The average left ventricular global longitudinal strain is -24.7 %. The global longitudinal strain is normal.  2. Right ventricular systolic function is normal. The right ventricular size is normal.  3. The mitral valve is normal in structure. No evidence of mitral valve regurgitation. No evidence of mitral stenosis.  4. The aortic valve is normal in structure. Aortic valve regurgitation is not visualized. No aortic stenosis is present.  5. The inferior vena cava is normal in size with greater than 50% respiratory variability, suggesting right atrial pressure of 3 mmHg. FINDINGS  Left Ventricle: Left ventricular ejection fraction, by estimation, is 60 to 65%. The left ventricle has normal function. The left ventricle has no regional wall motion abnormalities. The average left ventricular global longitudinal strain is -24.7 %. The global longitudinal strain is normal. The left ventricular internal cavity size was normal in size. There is no left ventricular hypertrophy. Left ventricular diastolic parameters were normal. Right Ventricle: The right ventricular size is normal. No increase in right ventricular wall thickness. Right ventricular systolic function is normal. Left Atrium: Left atrial size was normal in size. Right Atrium: Right atrial size was normal in size. Pericardium: There is no evidence of pericardial effusion. Mitral Valve: The mitral valve is normal in structure. No evidence of mitral valve regurgitation. No evidence of mitral valve stenosis. Tricuspid Valve: The tricuspid valve is normal in structure. Tricuspid valve regurgitation is not demonstrated. No evidence of tricuspid stenosis. Aortic Valve: The aortic valve is normal in structure. Aortic valve regurgitation is not  visualized. No aortic stenosis is present. Pulmonic Valve: The pulmonic valve was normal in structure. Pulmonic valve regurgitation is not visualized. No evidence of pulmonic stenosis. Aorta: The aortic root is normal in size and structure. Venous: The inferior vena cava is normal in size with greater than 50% respiratory variability, suggesting right atrial pressure of 3 mmHg. IAS/Shunts: No atrial level shunt detected by color flow Doppler.  LEFT VENTRICLE PLAX 2D LVIDd:         3.60 cm     Diastology LVIDs:         2.40 cm     LV e' medial:    9.03 cm/s LV PW:         1.10 cm     LV E/e' medial:  10.6 LV IVS:        1.20 cm     LV e' lateral:   14.50 cm/s LVOT diam:     2.10 cm     LV E/e' lateral: 6.6 LV SV:         92 LV SV Index:   44          2D Longitudinal Strain LVOT Area:     3.46 cm    2D Strain GLS Avg:     -24.7 %  LV Volumes (MOD) LV vol d, MOD A2C: 39.0 ml 3D Volume EF: LV vol d, MOD A4C: 52.4 ml 3D EF:        70 % LV vol s, MOD A2C: 10.4 ml LV EDV:       125 ml LV vol s, MOD A4C: 16.3 ml LV ESV:       37 ml LV SV  MOD A2C:     28.6 ml LV SV:        87 ml LV SV MOD A4C:     52.4 ml LV SV MOD BP:      32.5 ml RIGHT VENTRICLE             IVC RV S prime:     15.20 cm/s  IVC diam: 1.40 cm TAPSE (M-mode): 2.7 cm LEFT ATRIUM             Index       RIGHT ATRIUM           Index LA diam:        3.10 cm 1.47 cm/m  RA Area:     12.50 cm LA Vol (A2C):   21.4 ml 10.13 ml/m RA Volume:   30.60 ml  14.49 ml/m LA Vol (A4C):   27.0 ml 12.79 ml/m LA Biplane Vol: 24.3 ml 11.51 ml/m  AORTIC VALVE LVOT Vmax:   144.00 cm/s LVOT Vmean:  94.300 cm/s LVOT VTI:    0.267 m  AORTA Ao Root diam: 3.00 cm MITRAL VALVE MV Area (PHT): 3.60 cm    SHUNTS MV Decel Time: 211 msec    Systemic VTI:  0.27 m MV E velocity: 95.60 cm/s  Systemic Diam: 2.10 cm MV A velocity: 84.40 cm/s MV E/A ratio:  1.13 Mihai Croitoru MD Electronically signed by Sanda Klein MD Signature Date/Time: 03/13/2021/1:17:39 PM    Final    MM CLIP  PLACEMENT LEFT  Result Date: 03/14/2021 CLINICAL DATA:  Evaluate BARBELL clip placement following MR guided biopsy of 1.1 cm UPPER-OUTER LEFT breast mass. EXAM: DIAGNOSTIC LEFT MAMMOGRAM POST MRI BIOPSY COMPARISON:  Previous exam(s). FINDINGS: Mammographic images were obtained following MR guided biopsy of the 1.1 cm UPPER-OUTER LEFT breast mass. The BARBELL biopsy marking clip is in expected position at the site of biopsy. IMPRESSION: Appropriate positioning of the BARBELL shaped biopsy marking clip at the site of biopsy in the UPPER OUTER LEFT breast. Final Assessment: Post Procedure Mammograms for Marker Placement Electronically Signed   By: Margarette Canada M.D.   On: 03/14/2021 10:22   MR LT BREAST BX W LOC DEV 1ST LESION IMAGE BX SPEC MR GUIDE  Addendum Date: 03/15/2021   ADDENDUM REPORT: 03/15/2021 12:42 ADDENDUM: Pathology revealed ADENOSIS, INCLUDING FOCAL SCLEROSING ADENOSIS, FIBROCYSTIC CHANGE- NO MALIGNANCY IDENTIFIED of the LEFT breast, upper outer. This was found to be concordant by Dr. Hassan Rowan. Pathology results were discussed with the patient by telephone. The patient reported doing well after the biopsy with tenderness at the site. Post biopsy instructions and care were reviewed and questions were answered. The patient was encouraged to call [facility] for any additional concerns. The patient has a recent diagnosis of right breast cancer and should follow her outlined treatment plan. Pathology results reported by Stacie Acres RN on 03/15/2021. Electronically Signed   By: Margarette Canada M.D.   On: 03/15/2021 12:42   Result Date: 03/15/2021 CLINICAL DATA:  45 year old female for tissue sampling of 1.1 cm UPPER-OUTER LEFT breast mass. Recent diagnosis of RIGHT breast cancer. EXAM: MRI GUIDED CORE NEEDLE BIOPSY OF THE LEFT BREAST TECHNIQUE: Multiplanar, multisequence MR imaging of the LEFT breast was performed both before and after administration of intravenous contrast.  CONTRAST:  63m GADAVIST GADOBUTROL 1 MMOL/ML IV SOLN COMPARISON:  Previous exams. FINDINGS: I met with the patient, and we discussed the procedure of MRI guided biopsy, including risks, benefits, and alternatives.  Specifically, we discussed the risks of infection, bleeding, tissue injury, clip migration, and inadequate sampling. Informed, written consent was given. The usual time out protocol was performed immediately prior to the procedure. Using sterile technique, 1% Lidocaine with and without epinephrine, MRI guidance, and a 9 gauge vacuum assisted device, biopsy was performed of the 1.1 cm mass in the middle to posterior depth UPPER OUTER LEFT breast using a LATERAL approach. At the conclusion of the procedure, a BARBELL tissue marker clip was deployed into the biopsy cavity. Follow-up 2-view mammogram was performed and dictated separately. IMPRESSION: MRI guided biopsy of the 1.1 cm UPPER OUTER LEFT breast mass. No apparent complications. Electronically Signed: By: Margarette Canada M.D. On: 03/14/2021 10:13     ELIGIBLE FOR AVAILABLE RESEARCH PROTOCOL:no  ASSESSMENT: 45 y.o. [place] woman status post right breast upper outer quadrant biopsy 02/23/2021 for a clinical T3 N1, stage IIB invasive ductal carcinoma, grade 3, triple positive, with an MIB-1 of 35%  (a) right periareolar biopsy 02/23/2021 was benign/concordant  (b) left breast nodule biopsy 03/14/2021 benign  (c) Punch biopsy of the right areolar area 03/22/2021 no malignancy  (d) CT of the chest abdomen and pelvis 03/20/2021 shows no evidence of metastatic disease; there is 2.5 cm cystic pancreatic mass and a 0.4 cm right liver lobe lesion requiring further follow-up  (e) bone scan 03/25/2021 shows no bone metastases  (1) genetics testing 03/26/2021 through the  [employer]RNA[employer] panel offered by [facility] found no deleterious mutations in APC, ATM, AXIN2, BARD1, BMPR1A, BRCA1, BRCA2, BRIP1, CDH1, CDK4, CDKN2A, CHEK2,  DICER1, EPCAM, GREM1, HOXB13, MEN1, MLH1, MSH2, MSH3, MSH6, MUTYH, NBN, NF1, NF2, NTHL1, PALB2, PMS2, POLD1, POLE, PTEN, RAD51C, RAD51D, RECQL, RET, SDHA, SDHAF2, SDHB, SDHC, SDHD, SMAD4, SMARCA4, STK11, TP53, TSC1, TSC2, and VHL. RNA data is routinely analyzed for use in variant interpretation for all genes.   (2) neoadjuvant chemotherapy consisting of carboplatin, docetaxel, trastuzumab and pertuzumab every 21 days x 6 beginning 03/28/2021  (3) anti-HER-2 immunotherapy to continue to total 1 year  (a) echo 03/13/2021 showed an ejection fraction in the 60- 65% range  (4) definitive surgery to follow  (5) adjuvant radiation  (6) antiestrogens    PLAN: Kristyne did generally well with her first cycle of treatment.  I think we can improve some of the symptoms.  I am starting her on valacyclovir 1 g daily for viral suppression.  I think that will take care of the oral mucositis issues.  For pain she will use Tylenol 500 mg with Aleve 220 mg up to 3 times a day as needed preferably with food.  She also has some oxycodone available.  I think that will make the pain issue much more bearable.  Finally she understands now that she does really need to take Imodium with the very first loose bowel movement.  When she did do that she had quite good control so I am going to continue with Perjeta at this time.  Of course we will reassess.  Her counts today are adequate so she does not need prophylactic antibiotics.  She will see me again 04/17/2021 with cycle #2  Total encounter time 25 minutes.   Virgie Dad. Ardelle Haliburton, MD 04/04/2021 9:14 AM Medical Oncology and Hematology [facility] [place], [place] [address] Tel. [phone]    Fax. [phone]   This document serves as a record of services personally performed by Lurline Del, MD. It was created on his behalf by Wilburn Mylar, a trained  medical scribe. The creation of this record is based on the scribe's  personal observations and the provider's statements to them.   I, Lurline Del MD, have reviewed the above documentation for accuracy and completeness, and I agree with the above.   *Total Encounter Time as defined by the Centers for Medicare and Medicaid Services includes, in addition to the face-to-face time of a patient visit (documented in the note above) non-face-to-face time: obtaining and reviewing outside history, ordering and reviewing medications, tests or procedures, care coordination (communications with other health care professionals or caregivers) and documentation in the medical record.

## 2021-04-04 ENCOUNTER — Inpatient Hospital Stay: Payer: No Typology Code available for payment source

## 2021-04-04 ENCOUNTER — Inpatient Hospital Stay (HOSPITAL_BASED_OUTPATIENT_CLINIC_OR_DEPARTMENT_OTHER): Payer: No Typology Code available for payment source | Admitting: Oncology

## 2021-04-04 ENCOUNTER — Other Ambulatory Visit: Payer: Self-pay

## 2021-04-04 ENCOUNTER — Encounter: Payer: Self-pay | Admitting: *Deleted

## 2021-04-04 VITALS — BP 130/77 | HR 105 | Temp 97.7°F | Resp 20 | Ht 64.0 in | Wt 234.4 lb

## 2021-04-04 DIAGNOSIS — C50411 Malignant neoplasm of upper-outer quadrant of right female breast: Secondary | ICD-10-CM

## 2021-04-04 DIAGNOSIS — Z170 Estrogen receptor positive status [ER+]: Secondary | ICD-10-CM | POA: Diagnosis not present

## 2021-04-04 DIAGNOSIS — Z5112 Encounter for antineoplastic immunotherapy: Secondary | ICD-10-CM | POA: Diagnosis not present

## 2021-04-04 LAB — COMPREHENSIVE METABOLIC PANEL
ALT: 37 U/L (ref 0–44)
AST: 16 U/L (ref 15–41)
Albumin: 3.5 g/dL (ref 3.5–5.0)
Alkaline Phosphatase: 67 U/L (ref 38–126)
Anion gap: 9 (ref 5–15)
BUN: 8 mg/dL (ref 6–20)
CO2: 27 mmol/L (ref 22–32)
Calcium: 8.5 mg/dL — ABNORMAL LOW (ref 8.9–10.3)
Chloride: 104 mmol/L (ref 98–111)
Creatinine, Ser: 0.9 mg/dL (ref 0.44–1.00)
GFR, Estimated: >60 mL/min (ref >60)
Glucose, Bld: 111 mg/dL — ABNORMAL HIGH (ref 70–99)
Potassium: 4.2 mmol/L (ref 3.5–5.1)
Sodium: 140 mmol/L (ref 135–145)
Total Bilirubin: 0.3 mg/dL (ref 0.3–1.2)
Total Protein: 6.2 g/dL — ABNORMAL LOW (ref 6.5–8.1)

## 2021-04-04 LAB — CBC WITH DIFFERENTIAL/PLATELET
Abs Immature Granulocytes: 0.32 10*3/uL — ABNORMAL HIGH (ref 0.00–0.07)
Basophils Absolute: 0 10*3/uL (ref 0.0–0.1)
Basophils Relative: 1 %
Eosinophils Absolute: 0 10*3/uL (ref 0.0–0.5)
Eosinophils Relative: 1 %
HCT: 37.6 % (ref 36.0–46.0)
Hemoglobin: 12.9 g/dL (ref 12.0–15.0)
Immature Granulocytes: 6 %
Lymphocytes Relative: 35 %
Lymphs Abs: 2 10*3/uL (ref 0.7–4.0)
MCH: 30.3 pg (ref 26.0–34.0)
MCHC: 34.3 g/dL (ref 30.0–36.0)
MCV: 88.3 fL (ref 80.0–100.0)
Monocytes Absolute: 0.3 10*3/uL (ref 0.1–1.0)
Monocytes Relative: 5 %
Neutro Abs: 3 10*3/uL (ref 1.7–7.7)
Neutrophils Relative %: 52 %
Platelets: 256 10*3/uL (ref 150–400)
RBC: 4.26 MIL/uL (ref 3.87–5.11)
RDW: 14.5 % (ref 11.5–15.5)
WBC: 5.7 10*3/uL (ref 4.0–10.5)
nRBC: 2.1 % — ABNORMAL HIGH (ref 0.0–0.2)

## 2021-04-04 MED ORDER — VALACYCLOVIR HCL 1 G PO TABS: 1000.0000 mg | ORAL_TABLET | Freq: Every day | ORAL | 1 refills | Status: DC

## 2021-04-04 MED ORDER — ACETAMINOPHEN 500 MG PO TABS: 500.0000 mg | ORAL_TABLET | Freq: Three times a day (TID) | ORAL | 0 refills | Status: DC | PRN

## 2021-04-04 MED ORDER — NAPROXEN SODIUM 220 MG PO TABS
220.0000 mg | ORAL_TABLET | Freq: Three times a day (TID) | ORAL | 0 refills | Status: DC | PRN
Start: 2021-04-04 — End: 2021-04-12

## 2021-04-10 NOTE — Progress Notes (Signed)
Date:  04/12/2021   ID:  Diane Miranda, DOB Jan 24, 1976, MRN [id number]  PCP:  Diane Docker, MD  Cardiologist:  Diane Kras, DO, [facility] (established care 04/12/2021)  REASON FOR CONSULT: Tachycardia  REQUESTING PHYSICIAN:  Diane Docker, MD [address] [place],  [place] [address]  Chief Complaint  Patient presents with  . Tachycardia  . New Patient (Initial Visit)    HPI  Diane Miranda is a 45 y.o. female who presents to the office with a chief complaint of "tachycardia / palpitation." Patient's past medical history and cardiovascular risk factors include: Hx of COVID 2021, right sided breast cancer (dx March 2022) currently on chemotherapy, hyperlipidemia, hypertension, GERD, obesity due to excess calorie.   She is referred to the office at the request of Pavelock, Ralene Bathe, MD for evaluation of tachycardia.  Patient presents today for evaluation of tachycardia.  Patient states that its been more noticeable since her COVID-19 infection back in 2021.  She is noted her heart rate to be around 110-112 bpm.  When she is resting or relaxing she does appreciate palpitations which are short-lived (few seconds), self-limited.  She has not experienced any near-syncope or syncopal events.  No significant consumption of caffeine rarely drinks soda or coffee.  No new over-the-counter medications.  No herbal supplements, smoking, illicits, marijuana use, alcohol, energy drinks, stimulant medications, weight loss supplements.  She was recently diagnosed with breast cancer in March 2022 and is currently undergoing chemotherapy with plans of radiation and surgery thereafter.  Currently on Taxotere and carboplatin.  No family history of premature coronary disease or sudden cardiac death.   FUNCTIONAL STATUS: Walks 30 to 40 minutes 3-4 times a week.  ALLERGIES: Allergies  Allergen Reactions  . Hydrochlorothiazide Anaphylaxis and Swelling    MEDICATION LIST  PRIOR TO VISIT: Current Meds  Medication Sig  . acetaminophen (TYLENOL) 500 MG tablet Take 1 tablet (500 mg total) by mouth 3 (three) times daily with meals as needed. Take with aleve 220 mg tablet  . carvedilol (COREG) 3.125 MG tablet Take 1 tablet (3.125 mg total) by mouth 2 (two) times daily with a meal.  . dexamethasone (DECADRON) 4 MG tablet Take 2 tablets (8 mg total) by mouth 2 (two) times daily. Start the day before Taxotere. Then take daily x 3 days after chemotherapy.  Marland Kitchen Fexofenadine HCl (ALLEGRA ALLERGY PO) Take by mouth.  . lidocaine-prilocaine (EMLA) cream Apply to affected area once  . lisinopril (ZESTRIL) 20 MG tablet Take 1 tablet (20 mg total) by mouth every morning.  . loratadine (CLARITIN) 10 MG tablet Take 1 tablet (10 mg total) by mouth daily. (Patient taking differently: Take 10 mg by mouth daily as needed.)  . Multiple Vitamins-Minerals (MULTIVITAMIN WITH MINERALS) tablet Take 1 tablet by mouth daily.  . ondansetron (ZOFRAN) 8 MG tablet Take 1 tablet (8 mg total) by mouth 2 (two) times daily as needed (Nausea or vomiting). Start on the third day after chemotherapy.  Marland Kitchen oxyCODONE (OXY IR/ROXICODONE) 5 MG immediate release tablet Take 1 tablet (5 mg total) by mouth every 6 (six) hours as needed for severe pain.  Marland Kitchen prochlorperazine (COMPAZINE) 10 MG tablet Take 1 tablet (10 mg total) by mouth every 6 (six) hours as needed (Nausea or vomiting).  . rosuvastatin (CRESTOR) 10 MG tablet Take 10 mg by mouth daily.  . Turmeric (QC TUMERIC COMPLEX PO) Take by mouth.  . valACYclovir (VALTREX) 1000 MG tablet Take 1 tablet (1,000 mg  total) by mouth daily.  . [DISCONTINUED] amLODipine (NORVASC) 10 MG tablet Take 10 mg by mouth daily.     PAST MEDICAL HISTORY: Past Medical History:  Diagnosis Date  . Allergy 2000  . Asthma    as child  . Breast cancer ([place])   . Family history of lung cancer 03/08/2021  . Family history of uterine cancer 03/08/2021  . GERD (gastroesophageal reflux  disease)   . Headache   . History of COVID-19   . Hyperlipidemia   . Hypertension   . Prediabetes   . right breast ca dx'd 02/2021  . Trimalleolar fracture of ankle, closed, left, initial encounter     PAST SURGICAL HISTORY: Past Surgical History:  Procedure Laterality Date  . BREAST BIOPSY Right 03/22/2021   Procedure: PUNCH BIOPSY OF RIGHT AREOLA;  Surgeon: Stark Klein, MD;  Location: [facility];  Service: General;  Laterality: Right;  . FRACTURE SURGERY  2018  . NO PAST SURGERIES    . ORIF ANKLE FRACTURE Left 07/10/2017   Procedure: OPEN REDUCTION INTERNAL FIXATION (ORIF) trimallolar ANKLE FRACTURE;  Surgeon: Wylene Simmer, MD;  Location: [facility];  Service: Orthopedics;  Laterality: Left;  . PORTACATH PLACEMENT Left 03/22/2021   Procedure: INSERTION PORT-A-CATH;  Surgeon: Stark Klein, MD;  Location: [facility];  Service: General;  Laterality: Left;    FAMILY HISTORY: The patient family history includes Diabetes in her brother and father; Early death in her mother; Hearing loss in her maternal grandmother; Heart disease in her mother; Hypertension in her father, maternal grandmother, and mother; Lung cancer in an other family member; Throat cancer in an other family member; Uterine cancer (age of onset: 63) in an other family member.  SOCIAL HISTORY:  The patient  reports that she has never smoked. She has never used smokeless tobacco. She reports previous alcohol use. She reports that she does not use drugs.  REVIEW OF SYSTEMS: Review of Systems  Constitutional: Negative for chills and fever.  HENT: Negative for hoarse voice and nosebleeds.   Eyes: Negative for discharge, double vision and pain.  Cardiovascular: Positive for palpitations. Negative for chest pain, claudication, dyspnea on exertion, leg swelling, near-syncope, orthopnea, paroxysmal nocturnal dyspnea and syncope.  Respiratory: Negative for hemoptysis and shortness of  breath.   Musculoskeletal: Negative for muscle cramps and myalgias.  Gastrointestinal: Negative for abdominal pain, constipation, diarrhea, hematemesis, hematochezia, melena, nausea and vomiting.  Neurological: Negative for dizziness and light-headedness.    PHYSICAL EXAM: Vitals with BMI 04/12/2021 04/12/2021 04/04/2021  Height - 5' 4"  5' 4"   Weight - 235 lbs 6 oz 234 lbs 6 oz  BMI - 08.14 48.18  Systolic 563 149 702  Diastolic 90 96 77  Pulse 637 116 105    CONSTITUTIONAL: Well-developed and well-nourished. No acute distress.  SKIN: Skin is warm and dry. No rash noted. No cyanosis. No pallor. No jaundice HEAD: Normocephalic and atraumatic.  EYES: No scleral icterus MOUTH/THROAT: Moist oral membranes.  NECK: No JVD present. No thyromegaly noted. No carotid bruits  LYMPHATIC: No visible cervical adenopathy.  CHEST Normal respiratory effort. No intercostal retractions  LUNGS: Clear to auscultation bilaterally.  No stridor. No wheezes. No rales.  CARDIOVASCULAR: Regular rate and rhythm, positive S1-S2, no murmurs rubs or gallops appreciated. ABDOMINAL: No apparent ascites.  EXTREMITIES: No peripheral edema  HEMATOLOGIC: No significant bruising NEUROLOGIC: Oriented to person, place, and time. Nonfocal. Normal muscle tone.  PSYCHIATRIC: Normal mood and affect. Normal behavior. Cooperative  CARDIAC  DATABASE: EKG: 04/12/2021: Sinus tachycardia, 102 bpm, low voltage in the precordial leads, poor R wave progression, without underlying injury pattern.   Echocardiogram: 03/13/2021: LVEF 60-65%, no regional wall motion abnormalities, global longitudinal strain -24.7%, right ventricular size and function normal, no significant valvular heart disease, right atrial pressure approximately 3 mmHg.   Stress Testing: No results found for this or any previous visit from the past 1095 days.  Heart Catheterization: None  LABORATORY DATA: CBC Latest Ref Rng & Units 04/04/2021 03/28/2021 03/07/2021   WBC 4.0 - 10.5 K/uL 5.7 12.3(H) 11.9(H)  Hemoglobin 12.0 - 15.0 g/dL 12.9 13.3 13.4  Hematocrit 36.0 - 46.0 % 37.6 38.5 39.8  Platelets 150 - 400 K/uL 256 325 330    CMP Latest Ref Rng & Units 04/04/2021 03/28/2021 03/07/2021  Glucose 70 - 99 mg/dL 111(H) 233(H) 122(H)  BUN 6 - 20 mg/dL 8 10 8   Creatinine 0.44 - 1.00 mg/dL 0.90 0.92 0.92  Sodium 135 - 145 mmol/L 140 137 142  Potassium 3.5 - 5.1 mmol/L 4.2 4.1 3.6  Chloride 98 - 111 mmol/L 104 106 106  CO2 22 - 32 mmol/L 27 19(L) 26  Calcium 8.9 - 10.3 mg/dL 8.5(L) 8.9 8.7(L)  Total Protein 6.5 - 8.1 g/dL 6.2(L) 7.1 7.0  Total Bilirubin 0.3 - 1.2 mg/dL 0.3 0.4 0.6  Alkaline Phos 38 - 126 U/L 67 62 63  AST 15 - 41 U/L 16 34 13(L)  ALT 0 - 44 U/L 37 92(H) 16    Lipid Panel     Component Value Date/Time   CHOL 168 08/03/2008 0927   TRIG 73 08/03/2008 0927   TRIG 104 11/12/2006 1022   HDL 26.4 (L) 08/03/2008 0927   CHOLHDL 6.4 CALC 08/03/2008 0927   VLDL 15 08/03/2008 0927   LDLCALC 127 (H) 08/03/2008 0927    No components found for: NTPROBNP No results for input(s): PROBNP in the last 8760 hours. No results for input(s): TSH in the last 8760 hours.  BMP Recent Labs    03/07/21 1219 03/28/21 0756 04/04/21 0834  NA 142 137 140  K 3.6 4.1 4.2  CL 106 106 104  CO2 26 19* 27  GLUCOSE 122* 233* 111*  BUN 8 10 8   CREATININE 0.92 0.92 0.90  CALCIUM 8.7* 8.9 8.5*  GFRNONAA >60 >60 >60    HEMOGLOBIN A1C Lab Results  Component Value Date   HGBA1C 5.2 11/12/2006   External Labs:  Date Collected: 02/03/2021 , information obtained by PCP Potassium: 3.5 Creatinine 0.80 mg/dL. eGFR: 105 mL/min per 1.73 m Hemoglobin: 13.7 g/dL and hematocrit: 40.3 % Lipid profile: Total cholesterol 188 , triglycerides 159 , HDL 30 , LDL 131, non-HDL 158 AST: 8 , ALT: 6 , alkaline phosphatase: 52  Hemoglobin A1c: 5.7 TSH: 1.41   IMPRESSION:    ICD-10-CM   1. Tachycardia  R00.0 EKG 12-Lead    carvedilol (COREG) 3.125 MG tablet  2.  Palpitations  R00.2 carvedilol (COREG) 3.125 MG tablet  3. History of COVID-19  Z86.16   4. Benign hypertension  I10 lisinopril (ZESTRIL) 20 MG tablet  5. Mixed hyperlipidemia  E78.2   6. Class 3 severe obesity due to excess calories with serious comorbidity and body mass index (BMI) of 40.0 to 44.9 in adult [facility])  E66.01    Z68.41      RECOMMENDATIONS: Diane Miranda is a 45 y.o. female whose past medical history and cardiac risk factors include: Hx of COVID 2021, right sided breast cancer (dx  March 2022) currently on chemotherapy, hyperlipidemia, hypertension, GERD, obesity due to excess calorie.   Tachycardia/palpitations: Symptoms have been present since her COVID infection back in 2021 but recently has been more concerned given the new diagnosis of breast cancer and currently undergoing chemotherapy. No identifiable reversible causes. Recommend 3-day extended Holter monitor to evaluate for underlying dysrhythmias prior to starting pharmacological therapy. Once she has completed the Holter monitor she is recommended to start carvedilol 3.125 mg p.o. twice daily. Recommend keeping a log of her blood pressures and heart rate for now until the medications have been appropriately uptitrated.  Benign essential hypertension: Currently on amlodipine. She does not check her home blood pressures; however, when she is at doctors offices that are usually between 130--145 mmHg. Given the fact that he is on a chemotherapy we will transition her to cardioprotective medications.  We will discontinue amlodipine. Will start lisinopril 20 mg p.o. every morning.  Medication profile discussed. Blood work in 1 week to evaluate kidney function and electrolytes.  Patient states that she has blood work done almost every week for chemo and will call the office once blood work is complete so it can be reviewed.  Recently diagnosed with right-sided breast cancer: Currently on chemotherapy. Recommend  initiating/transitioning to cardioprotective medications: We will start carvedilol and transition to lisinopril as discussed above.  Most recent echocardiogram from March 2022 reviewed and noted above.  Independently reviewed labs as a part of this office encounter and noted above for further reference.  FINAL MEDICATION LIST END OF ENCOUNTER: Meds ordered this encounter  Medications  . carvedilol (COREG) 3.125 MG tablet    Sig: Take 1 tablet (3.125 mg total) by mouth 2 (two) times daily with a meal.    Dispense:  60 tablet    Refill:  0  . lisinopril (ZESTRIL) 20 MG tablet    Sig: Take 1 tablet (20 mg total) by mouth every morning.    Dispense:  30 tablet    Refill:  0    Medications Discontinued During This Encounter  Medication Reason  . naproxen sodium (ALEVE) 220 MG tablet Error  . amLODipine (NORVASC) 10 MG tablet Change in therapy     Current Outpatient Medications:  .  acetaminophen (TYLENOL) 500 MG tablet, Take 1 tablet (500 mg total) by mouth 3 (three) times daily with meals as needed. Take with aleve 220 mg tablet, Disp: 90 tablet, Rfl: 0 .  carvedilol (COREG) 3.125 MG tablet, Take 1 tablet (3.125 mg total) by mouth 2 (two) times daily with a meal., Disp: 60 tablet, Rfl: 0 .  dexamethasone (DECADRON) 4 MG tablet, Take 2 tablets (8 mg total) by mouth 2 (two) times daily. Start the day before Taxotere. Then take daily x 3 days after chemotherapy., Disp: 30 tablet, Rfl: 1 .  Fexofenadine HCl (ALLEGRA ALLERGY PO), Take by mouth., Disp: , Rfl:  .  lidocaine-prilocaine (EMLA) cream, Apply to affected area once, Disp: 30 g, Rfl: 3 .  lisinopril (ZESTRIL) 20 MG tablet, Take 1 tablet (20 mg total) by mouth every morning., Disp: 30 tablet, Rfl: 0 .  loratadine (CLARITIN) 10 MG tablet, Take 1 tablet (10 mg total) by mouth daily. (Patient taking differently: Take 10 mg by mouth daily as needed.), Disp: 90 tablet, Rfl: 4 .  Multiple Vitamins-Minerals (MULTIVITAMIN WITH MINERALS) tablet,  Take 1 tablet by mouth daily., Disp: , Rfl:  .  ondansetron (ZOFRAN) 8 MG tablet, Take 1 tablet (8 mg total) by mouth 2 (two) times daily  as needed (Nausea or vomiting). Start on the third day after chemotherapy., Disp: 30 tablet, Rfl: 1 .  oxyCODONE (OXY IR/ROXICODONE) 5 MG immediate release tablet, Take 1 tablet (5 mg total) by mouth every 6 (six) hours as needed for severe pain., Disp: 5 tablet, Rfl: 0 .  prochlorperazine (COMPAZINE) 10 MG tablet, Take 1 tablet (10 mg total) by mouth every 6 (six) hours as needed (Nausea or vomiting)., Disp: 30 tablet, Rfl: 1 .  rosuvastatin (CRESTOR) 10 MG tablet, Take 10 mg by mouth daily., Disp: , Rfl:  .  Turmeric (QC TUMERIC COMPLEX PO), Take by mouth., Disp: , Rfl:  .  valACYclovir (VALTREX) 1000 MG tablet, Take 1 tablet (1,000 mg total) by mouth daily., Disp: 60 tablet, Rfl: 1  Orders Placed This Encounter  Procedures  . EKG 12-Lead    There are no Patient Instructions on file for this visit.   --Continue cardiac medications as reconciled in final medication list. --Return in about 4 weeks (around 05/10/2021) for Follow up, Palpitations, recent medication changes. Or sooner if needed. --Continue follow-up with your primary care physician regarding the management of your other chronic comorbid conditions.  Patient's questions and concerns were addressed to her satisfaction. She voices understanding of the instructions provided during this encounter.   This note was created using a voice recognition software as a result there may be grammatical errors inadvertently enclosed that do not reflect the nature of this encounter. Every attempt is made to correct such errors.  Diane Miranda, [place], [facility]  Pager: [phone] Office: [phone]

## 2021-04-12 ENCOUNTER — Other Ambulatory Visit: Payer: Self-pay

## 2021-04-12 ENCOUNTER — Encounter: Payer: Self-pay | Admitting: Cardiology

## 2021-04-12 ENCOUNTER — Ambulatory Visit: Payer: No Typology Code available for payment source | Admitting: Cardiology

## 2021-04-12 ENCOUNTER — Inpatient Hospital Stay: Payer: No Typology Code available for payment source

## 2021-04-12 VITALS — BP 144/90 | HR 107 | Temp 98.4°F | Resp 16 | Ht 64.0 in | Wt 235.4 lb

## 2021-04-12 DIAGNOSIS — Z8616 Personal history of COVID-19: Secondary | ICD-10-CM | POA: Insufficient documentation

## 2021-04-12 DIAGNOSIS — E782 Mixed hyperlipidemia: Secondary | ICD-10-CM

## 2021-04-12 DIAGNOSIS — R002 Palpitations: Secondary | ICD-10-CM

## 2021-04-12 DIAGNOSIS — I10 Essential (primary) hypertension: Secondary | ICD-10-CM

## 2021-04-12 DIAGNOSIS — R000 Tachycardia, unspecified: Secondary | ICD-10-CM

## 2021-04-12 DIAGNOSIS — Z6841 Body Mass Index (BMI) 40.0 and over, adult: Secondary | ICD-10-CM

## 2021-04-12 MED ORDER — LISINOPRIL 20 MG PO TABS: 20.0000 mg | ORAL_TABLET | Freq: Every morning | ORAL | 0 refills | Status: DC

## 2021-04-12 MED ORDER — CARVEDILOL 3.125 MG PO TABS
3.1250 mg | ORAL_TABLET | Freq: Two times a day (BID) | ORAL | 0 refills | Status: DC
Start: 2021-04-12 — End: 2021-05-07

## 2021-04-16 ENCOUNTER — Other Ambulatory Visit: Payer: Self-pay | Admitting: Oncology

## 2021-04-16 ENCOUNTER — Other Ambulatory Visit: Payer: Self-pay

## 2021-04-16 ENCOUNTER — Ambulatory Visit (HOSPITAL_COMMUNITY)
Admission: RE | Admit: 2021-04-16 | Discharge: 2021-04-16 | Disposition: A | Discharge status: Home / Self Care | Payer: No Typology Code available for payment source | Source: Ambulatory Visit | Attending: Oncology | Admitting: Oncology

## 2021-04-16 DIAGNOSIS — K835 Biliary cyst: Secondary | ICD-10-CM

## 2021-04-16 MED ORDER — GADOBUTROL 1 MMOL/ML IV SOLN
10.0000 mL | Freq: Once | INTRAVENOUS | Status: AC | PRN
  Administered 2021-04-16: 10 mL via INTRAVENOUS

## 2021-04-16 NOTE — Progress Notes (Signed)
[facility]  [phone]) [phone] [phone]) [phone]     ID: Diane Miranda DOB: 1976/06/25  MR#: [id number]  FWY#:[id number]  Patient Care Team: [name], MD as PCP - General (Internal Medicine) Mauro Kaufmann, RN as Oncology Nurse Navigator Rockwell Germany, RN as Oncology Nurse Navigator Stark Klein, MD as Consulting Physician (General Surgery) Layni Kreamer, Virgie Dad, MD as Consulting Physician (Oncology) Gery Pray, MD as Consulting Physician (Radiation Oncology) Wylene Simmer, MD as Consulting Physician (Orthopedic Surgery) Chauncey Cruel, MD OTHER MD:  CHIEF COMPLAINT: triple positive breast cancer  CURRENT TREATMENT: Neoadjuvant chemotherapy   INTERVAL HISTORY: Diane Miranda returns today for follow up and treatment of her triple positive right breast cancer.  She is accompanied by her "first born daughter".  She began neoadjuvant chemotherapy, consisting of carboplatin, docetaxel, trastuzumab and pertuzumab every 21 days x 6, on 03/28/2021.  Today is day 1 cycle 2.  Since her last visit, she underwent abdomen MRI yesterday, 04/16/2021 showing  1. Cystic lesion within the head of pancreas is again noted. No suspicious features identified. This is favored to represent a pseudocyst. Follow-up imaging in 6 months with repeat pancreas protocol MRI without and with contrast material is recommended. This recommendation follows ACR consensus guidelines: Management of Incidental Pancreatic Cysts: A White Paper of the ACR Incidental Findings Committee. J Am Coll Radiol 2774;12:878-676. 2. No evidence for metastatic disease within the abdomen. 3. Partially visualized enhancing left breast lesion identified measuring approximately 2.5 cm. Previously this was measured at 4.4 cm.  REVIEW OF SYSTEMS: Zan has recovered fairly completely from her first chemotherapy cycle and she is very encouraged that she is feeling so well right now.  Specifically  she is having no headaches, visual changes, nausea, vomiting, or fatigue.  She has not begun to lose her hair.  A detailed review of systems was otherwise stable.   COVID 19 VACCINATION STATUS: refuses vaccination; infection 10/2019   HISTORY OF CURRENT ILLNESS: From the original intake note:  Diane Miranda herself palpated a right breast mass and noted associated pain. She underwent bilateral diagnostic mammography with tomography and right breast ultrasonography at [facility] on 02/15/2021 showing: breast density category B; palpable 5.4 cm mass within outer right breast; two abnormal right axillary lymph nodes; 1.8 cm indeterminate hypoechoic area within upper-outer right breast; no evidence of left breast malignancy.  Accordingly on 02/23/2021 she proceeded to biopsy of the right breast areas in question. The pathology from this procedure ([id number]) showed:  1. Right Breast, 11 o'clock  - adenosis and columnar cell hyperplasia; this was concordant 2. Right Breast, 9 o'clock  - invasive ductal carcinoma, grade 3  - Prognostic indicators significant for: estrogen receptor, 40% positive with moderate staining intensity and progesterone receptor, 60% positive with strong staining intensity. Proliferation marker Ki67 at 35%. HER2 positive by immunohistochemistry (3+). 3. Right Axilla, lymph node  - invasive ductal carcinoma involving nodal tissue  She underwent breast MRI on 03/04/2021 showing: breast composition B; 4.7 cm mass in 8-9 o'clock position of posterior right breast represents recently diagnosed malignancy; two adjacent abnormal right axillary lymph nodes, one of which represents the biopsy-proven metastatic node; significant skin thickening in periareolar right breast, with significant increased T2 signal consistent with edema; mild abnormal enhancement within thickened skin, nonspecific.  There was also a 1.1 cm nonspecific enhancing mass in left breast at 2-3  o'clock.  Cancer Staging Malignant neoplasm of upper-outer quadrant of right breast in female, estrogen receptor positive (  [place]) Staging form: Breast, AJCC 8th Edition - Clinical: Stage IIB (cT3, cN1, cM0, G3, ER+, PR+, HER2+) - Signed by Chauncey Cruel, MD on 03/07/2021 Histologic grading system: 3 grade system  The patient's subsequent history is as detailed below.   PAST MEDICAL HISTORY: Past Medical History:  Diagnosis Date  . Allergy 2000  . Asthma    as child  . Breast cancer ([place])   . Family history of lung cancer 03/08/2021  . Family history of uterine cancer 03/08/2021  . GERD (gastroesophageal reflux disease)   . Headache   . History of COVID-19   . Hyperlipidemia   . Hypertension   . Prediabetes   . right breast ca dx'd 04/01/21  . Trimalleolar fracture of ankle, closed, left, initial encounter     PAST SURGICAL HISTORY: Past Surgical History:  Procedure Laterality Date  . BREAST BIOPSY Right 03/22/2021   Procedure: PUNCH BIOPSY OF RIGHT AREOLA;  Surgeon: Stark Klein, MD;  Location: [facility];  Service: General;  Laterality: Right;  . FRACTURE SURGERY  2018  . NO PAST SURGERIES    . ORIF ANKLE FRACTURE Left 07/10/2017   Procedure: OPEN REDUCTION INTERNAL FIXATION (ORIF) trimallolar ANKLE FRACTURE;  Surgeon: Wylene Simmer, MD;  Location: [facility];  Service: Orthopedics;  Laterality: Left;  . PORTACATH PLACEMENT Left 03/22/2021   Procedure: INSERTION PORT-A-CATH;  Surgeon: Stark Klein, MD;  Location: [facility];  Service: General;  Laterality: Left;    FAMILY HISTORY: Family History  Problem Relation Age of Onset  . Diabetes Father   . Hypertension Father   . Early death Mother   . Heart disease Mother   . Hypertension Mother   . Hearing loss Maternal Grandmother   . Hypertension Maternal Grandmother   . Uterine cancer Other 72       MGM's sister  . Throat cancer Other        MGM's brother; dx after 64  .  Lung cancer Other        PGM's sister; dx 44s  . Diabetes Brother    Her father is living at age 46 as of 04/01/2021. Her mother died at age 85 from [place]. Raisha has two brothers (and no sisters). She reports endometrial cancer in a maternal great aunt and lung cancer in a paternal aunt. There is no other family history of cancer to her knowledge.   GYNECOLOGIC HISTORY:  No LMP recorded. Menarche: 45 years old Age at first live birth: 45 years old Ester P 2 LMP early 04-01-2021 (has since had IUD placed) Contraceptive: IUD in place [place]), previously used pills from 1996-2006 HRT n/a  Hysterectomy? no BSO? 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 She is single. She lives at home with daughter Joylene Draft (age 11), adopted daughter Hetty Blend (age 19), and the patient's maternal grandmother, who has significant dementia issues. Daughter Levy Pupa, age 97, works in Press photographer in [place]. Chevy is a Restaurant manager, fast food.    ADVANCED DIRECTIVES: not in place; however the patient made it clear during her 03/07/2021 visit that she would not accept blood product transfusions for any reason including immediately life saving reasons   HEALTH MAINTENANCE: Social History   Tobacco Use  . Smoking status: Never Smoker  . Smokeless tobacco: Never Used  Vaping Use  . Vaping Use: Never used  Substance Use Topics  . Alcohol use: Not Currently  Alcohol/week: 0.0 standard drinks    Comment: social  . Drug use: No     Colonoscopy: n/a (age)  PAP: 2020  Bone density: n/a (age)   Allergies  Allergen Reactions  . Hydrochlorothiazide Anaphylaxis and Swelling    Current Outpatient Medications  Medication Sig Dispense Refill  . acetaminophen (TYLENOL) 500 MG tablet Take 1 tablet (500 mg total) by mouth 3 (three) times daily with Miranda as needed. Take with aleve 220 mg tablet 90 tablet 0  . carvedilol (COREG) 3.125 MG tablet  Take 1 tablet (3.125 mg total) by mouth 2 (two) times daily with a meal. 60 tablet 0  . dexamethasone (DECADRON) 4 MG tablet Take 2 tablets (8 mg total) by mouth 2 (two) times daily. Start the day before Taxotere. Then take daily x 3 days after chemotherapy. 30 tablet 1  . Fexofenadine HCl (ALLEGRA ALLERGY PO) Take by mouth.    . lidocaine-prilocaine (EMLA) cream Apply to affected area once 30 g 3  . lisinopril (ZESTRIL) 20 MG tablet Take 1 tablet (20 mg total) by mouth every morning. 30 tablet 0  . loratadine (CLARITIN) 10 MG tablet Take 1 tablet (10 mg total) by mouth daily. (Patient taking differently: Take 10 mg by mouth daily as needed.) 90 tablet 4  . Multiple Vitamins-Minerals (MULTIVITAMIN WITH MINERALS) tablet Take 1 tablet by mouth daily.    . ondansetron (ZOFRAN) 8 MG tablet Take 1 tablet (8 mg total) by mouth 2 (two) times daily as needed (Nausea or vomiting). Start on the third day after chemotherapy. 30 tablet 1  . oxyCODONE (OXY IR/ROXICODONE) 5 MG immediate release tablet Take 1 tablet (5 mg total) by mouth every 6 (six) hours as needed for severe pain. 5 tablet 0  . prochlorperazine (COMPAZINE) 10 MG tablet Take 1 tablet (10 mg total) by mouth every 6 (six) hours as needed (Nausea or vomiting). 30 tablet 1  . rosuvastatin (CRESTOR) 10 MG tablet Take 10 mg by mouth daily.    . Turmeric (QC TUMERIC COMPLEX PO) Take by mouth.    . valACYclovir (VALTREX) 1000 MG tablet Take 1 tablet (1,000 mg total) by mouth daily. 60 tablet 1   No current facility-administered medications for this visit.   Facility-Administered Medications Ordered in Other Visits  Medication Dose Route Frequency Provider Last Rate Last Admin  . sodium chloride flush (NS) 0.9 % injection 10 mL  10 mL Intracatheter PRN Aesha Agrawal, Virgie Dad, MD   10 mL at 04/17/21 1806    OBJECTIVE: African-American woman who appears well  Vitals:   04/17/21 1127  BP: (!) 147/89  Pulse: (!) 107  Resp: 18  Temp: 97.7 F (36.5 C)   SpO2: 98%     Body mass index is 40.65 kg/m.   Wt Readings from Last 3 Encounters:  04/17/21 236 lb 12.8 oz (107.4 kg)  04/12/21 235 lb 6.4 oz (106.8 kg)  04/04/21 234 lb 6.4 oz (106.3 kg)      ECOG FS:1 - Symptomatic but completely ambulatory  Sclerae unicteric, EOMs intact Wearing a mask No cervical or supraclavicular adenopathy Lungs no rales or rhonchi Heart regular rate and rhythm Abd soft, nontender, positive bowel sounds MSK no focal spinal tenderness, no upper extremity lymphedema Neuro: nonfocal, well oriented, appropriate affect Breasts: Today I am not able to palpate the mass in the right breast which was easily palpable before the first cycle of chemo.  This is favorable.   LAB RESULTS:  CMP     Component  Value Date/Time   NA 139 04/17/2021 1101   K 4.1 04/17/2021 1101   CL 108 04/17/2021 1101   CO2 25 04/17/2021 1101   GLUCOSE 157 (H) 04/17/2021 1101   GLUCOSE 105 (H) 11/12/2006 1022   BUN 8 04/17/2021 1101   CREATININE 0.89 04/17/2021 1101   CREATININE 0.92 03/07/2021 1219   CALCIUM 9.0 04/17/2021 1101   PROT 6.7 04/17/2021 1101   ALBUMIN 3.5 04/17/2021 1101   AST 14 (L) 04/17/2021 1101   AST 13 (L) 03/07/2021 1219   ALT 28 04/17/2021 1101   ALT 16 03/07/2021 1219   ALKPHOS 66 04/17/2021 1101   BILITOT 0.3 04/17/2021 1101   BILITOT 0.6 03/07/2021 1219   GFRNONAA >60 04/17/2021 1101   GFRNONAA >60 03/07/2021 1219   GFRAA >90 01/30/2015 1319    No results found for: TOTALPROTELP, ALBUMINELP, A1GS, A2GS, BETS, BETA2SER, GAMS, MSPIKE, SPEI  Lab Results  Component Value Date   WBC 12.2 (H) 04/17/2021   NEUTROABS 10.8 (H) 04/17/2021   HGB 11.5 (L) 04/17/2021   HCT 33.9 (L) 04/17/2021   MCV 88.7 04/17/2021   PLT 285 04/17/2021    No results found for: LABCA2  No components found for: [id number]  No results for input(s): INR in the last 168 hours.  No results found for: LABCA2  No results found for: LAG536  No results found for:  IWO032  No results found for: ZYY482  No results found for: CA2729  No components found for: HGQUANT  No results found for: CEA1 / No results found for: CEA1   No results found for: AFPTUMOR  No results found for: CHROMOGRNA  No results found for: KPAFRELGTCHN, LAMBDASER, KAPLAMBRATIO (kappa/lambda light chains)  No results found for: HGBA, HGBA2QUANT, HGBFQUANT, HGBSQUAN (Hemoglobinopathy evaluation)   No results found for: LDH  No results found for: IRON, TIBC, IRONPCTSAT (Iron and TIBC)  No results found for: FERRITIN  Urinalysis    Component Value Date/Time   COLORURINE YELLOW 01/30/2015 [place] 01/30/2015 1343   LABSPEC 1.011 01/30/2015 1343   PHURINE 7.5 01/30/2015 1343   GLUCOSEU NEGATIVE 01/30/2015 1343   HGBUR NEGATIVE 01/30/2015 1343   BILIRUBINUR NEGATIVE 01/30/2015 1343   KETONESUR NEGATIVE 01/30/2015 1343   PROTEINUR NEGATIVE 01/30/2015 1343   UROBILINOGEN 0.2 01/30/2015 1343   NITRITE NEGATIVE 01/30/2015 1343   LEUKOCYTESUR SMALL (A) 01/30/2015 1343    STUDIES: CT Chest W Contrast  Result Date: 03/21/2021 CLINICAL DATA:  Pretreatment staging for right breast cancer with right axillary lymph node involvement EXAM: CT CHEST, ABDOMEN, AND PELVIS WITH CONTRAST TECHNIQUE: Multidetector CT imaging of the chest, abdomen and pelvis was performed following the standard protocol during bolus administration of intravenous contrast. CONTRAST:  122m OMNIPAQUE IOHEXOL 300 MG/ML  SOLN COMPARISON:  None. FINDINGS: CT CHEST FINDINGS Cardiovascular: No thoracic aortic aneurysm. No central pulmonary embolus. Normal size heart. No significant pericardial effusion/thickening. Mediastinum/Nodes: Enlarged lobular RIGHT axillary lymph nodes measuring up to 1.7 cm on image 19/2. No LEFT axillary adenopathy Lungs/Pleura: No suspicious pulmonary nodules or masses. No pleural effusion. No pneumothorax. Musculoskeletal: Lobular enhancing 4.4 x 3.7 cm RIGHT breast  mass consistent with known diagnosis of breast cancer. Oval focus of gas and biopsy marker in the LEFT breast, sequela of recent vacuum assisted biopsy. No aggressive lytic or blastic lesions of bone. CT ABDOMEN PELVIS FINDINGS Hepatobiliary: Hyperdense 4 mm lesion in the right lobe of the liver on image 54/2 which is too small to accurately characterize. Gallbladder is  unremarkable. No biliary ductal dilation. Pancreas: Cystic 1.7 x 1.7 x 2.5 cm mass in the head of the pancreas on image 70/2 and 65/4. No pancreatic ductal dilation. No evidence of acute inflammation. Spleen: Normal in size without focal abnormality. Adrenals/Urinary Tract: Adrenal glands are unremarkable. Kidneys are normal, without renal calculi, focal lesion, or hydronephrosis. Bladder is unremarkable for degree of distension. Stomach/Bowel: Radiopaque orally ingested contrast traverses the rectum. Stomach is within normal limits. Appendix appears normal. No evidence of bowel wall thickening, distention, or inflammatory changes. Vascular/Lymphatic: No significant vascular findings are present. No enlarged abdominal or pelvic lymph nodes. Reproductive: Intrauterine device in place. Lobular uterine contour commonly related to uterine leiomyomas. No suspicious adnexal masses. Other: No abdominopelvic ascites. Musculoskeletal: No acute or significant osseous findings. IMPRESSION: 1. Lobular enhancing 4.4 cm RIGHT breast mass consistent with known diagnosis of breast cancer. 2. Enlarged lobular RIGHT axillary lymph nodes measuring up to 1.7 cm, compatible with disease involvement. 3. Cystic 1.7 x 1.7 x 2.5 cm mass in the head of the pancreas, nonspecific and incompletely evaluated, recommend further characterization with pancreas protocol MRI with and without contrast. 4. Hyperdense 4 mm lesion in the right lobe of the liver which is too small to accurately characterize. Attention on follow-up studies is recommended. 5. Intrauterine device in place  with lobular uterine contour commonly related to uterine leiomyomas. These results will be called to the ordering clinician or representative by the Radiologist Assistant, and communication documented in the PACS or [employer]. Electronically Signed   By: Dahlia Bailiff MD   On: 03/21/2021 08:47   NM Bone Scan Whole Body  Result Date: 03/25/2021 CLINICAL DATA:  Right breast cancer staging. Recent dental work. Left ankle plates and screws from previous fracture. EXAM: NUCLEAR MEDICINE WHOLE BODY BONE SCAN TECHNIQUE: Whole body anterior and posterior images were obtained approximately 3 hours after intravenous injection of radiopharmaceutical. RADIOPHARMACEUTICALS:  20.1 mCi Technetium-27mMDP IV COMPARISON:  Chest, abdomen and pelvis CT dated 03/20/2021 FINDINGS: Multiple foci of increased tracer uptake in the maxilla, compatible with the history of recent dental work. Increased tracer activity in the left ankle and both proximal feet, compatible with degenerative/posttraumatic changes. Increased tracer activity in the right breast, including the skin. Corresponding mass and skin thickening on the recent CT associated with the patient's recently diagnosed breast cancer. Normal renal and bladder activity. IMPRESSION: 1. No evidence of bony metastatic disease. 2. Changes in the right breast compatible with the patient's recently diagnosed breast cancer. Electronically Signed   By: SClaudie ReveringM.D.   On: 03/25/2021 23:45   CT Abdomen Pelvis W Contrast  Result Date: 03/21/2021 CLINICAL DATA:  Pretreatment staging for right breast cancer with right axillary lymph node involvement EXAM: CT CHEST, ABDOMEN, AND PELVIS WITH CONTRAST TECHNIQUE: Multidetector CT imaging of the chest, abdomen and pelvis was performed following the standard protocol during bolus administration of intravenous contrast. CONTRAST:  1035mOMNIPAQUE IOHEXOL 300 MG/ML  SOLN COMPARISON:  None. FINDINGS: CT CHEST FINDINGS Cardiovascular: No  thoracic aortic aneurysm. No central pulmonary embolus. Normal size heart. No significant pericardial effusion/thickening. Mediastinum/Nodes: Enlarged lobular RIGHT axillary lymph nodes measuring up to 1.7 cm on image 19/2. No LEFT axillary adenopathy Lungs/Pleura: No suspicious pulmonary nodules or masses. No pleural effusion. No pneumothorax. Musculoskeletal: Lobular enhancing 4.4 x 3.7 cm RIGHT breast mass consistent with known diagnosis of breast cancer. Oval focus of gas and biopsy marker in the LEFT breast, sequela of recent vacuum assisted biopsy. No aggressive lytic or  blastic lesions of bone. CT ABDOMEN PELVIS FINDINGS Hepatobiliary: Hyperdense 4 mm lesion in the right lobe of the liver on image 54/2 which is too small to accurately characterize. Gallbladder is unremarkable. No biliary ductal dilation. Pancreas: Cystic 1.7 x 1.7 x 2.5 cm mass in the head of the pancreas on image 70/2 and 65/4. No pancreatic ductal dilation. No evidence of acute inflammation. Spleen: Normal in size without focal abnormality. Adrenals/Urinary Tract: Adrenal glands are unremarkable. Kidneys are normal, without renal calculi, focal lesion, or hydronephrosis. Bladder is unremarkable for degree of distension. Stomach/Bowel: Radiopaque orally ingested contrast traverses the rectum. Stomach is within normal limits. Appendix appears normal. No evidence of bowel wall thickening, distention, or inflammatory changes. Vascular/Lymphatic: No significant vascular findings are present. No enlarged abdominal or pelvic lymph nodes. Reproductive: Intrauterine device in place. Lobular uterine contour commonly related to uterine leiomyomas. No suspicious adnexal masses. Other: No abdominopelvic ascites. Musculoskeletal: No acute or significant osseous findings. IMPRESSION: 1. Lobular enhancing 4.4 cm RIGHT breast mass consistent with known diagnosis of breast cancer. 2. Enlarged lobular RIGHT axillary lymph nodes measuring up to 1.7 cm,  compatible with disease involvement. 3. Cystic 1.7 x 1.7 x 2.5 cm mass in the head of the pancreas, nonspecific and incompletely evaluated, recommend further characterization with pancreas protocol MRI with and without contrast. 4. Hyperdense 4 mm lesion in the right lobe of the liver which is too small to accurately characterize. Attention on follow-up studies is recommended. 5. Intrauterine device in place with lobular uterine contour commonly related to uterine leiomyomas. These results will be called to the ordering clinician or representative by the Radiologist Assistant, and communication documented in the PACS or [employer]. Electronically Signed   By: Dahlia Bailiff MD   On: 03/21/2021 08:47   MR 3D Recon At Scanner  Result Date: 04/17/2021 CLINICAL DATA:  Evaluate mass within head of pancreas. History of right breast cancer. EXAM: MRI ABDOMEN WITHOUT AND WITH CONTRAST (INCLUDING MRCP) TECHNIQUE: Multiplanar multisequence MR imaging of the abdomen was performed both before and after the administration of intravenous contrast. Heavily T2-weighted images of the biliary and pancreatic ducts were obtained, and three-dimensional MRCP images were rendered by post processing. CONTRAST:  63m GADAVIST GADOBUTROL 1 MMOL/ML IV SOLN COMPARISON:  CT chest, abdomen and pelvis 03/20/2021. FINDINGS: Lower chest: No acute findings. Hepatobiliary: No suspicious liver abnormality. Small cysts noted within posterior right lobe of liver measuring 4 mm, image 9/18. Gallbladder appears normal. No biliary ductal dilatation. Pancreas: No main duct dilatation or inflammation. The cystic lesion within the head of pancreas is again noted. This has a maximum dimension of 2.3 cm, image 15/10. No signs of internal enhancement or mural nodularity. No additional pancreas lesions identified. Spleen:  Within normal limits in size and appearance. Adrenals/Urinary Tract: No masses identified. No evidence of hydronephrosis. Small cyst  noted within posterior cortex of the right mid kidney measuring 5 mm, image 55/32. No suspicious mass or hydronephrosis. Stomach/Bowel: Stomach appears normal. No abnormal bowel distention or wall thickening identified. Vascular/Lymphatic: No pathologically enlarged lymph nodes identified. No abdominal aortic aneurysm demonstrated. Other:  No free fluid or fluid collections. Musculoskeletal: No suspicious bone lesions identified. The enhancing left breast lesion identified previously is partially visualized measuring approximately 2.5 cm, image 15/28. Previously this was measured at 4.4 cm. IMPRESSION: 1. Cystic lesion within the head of pancreas is again noted. No suspicious features identified. This is favored to represent a pseudocyst. Follow-up imaging in 6 months with repeat pancreas protocol  MRI without and with contrast material is recommended. This recommendation follows ACR consensus guidelines: Management of Incidental Pancreatic Cysts: A White Paper of the ACR Incidental Findings Committee. J Am Coll Radiol 1856;31:497-026. 2. No evidence for metastatic disease within the abdomen. 3. Partially visualized enhancing left breast lesion identified measuring approximately 2.5 cm. Previously this was measured at 4.4 cm. Electronically Signed   By: Kerby Moors M.D.   On: 04/17/2021 12:05   DG CHEST PORT 1 VIEW  Result Date: 03/22/2021 CLINICAL DATA:  45 year old female status post Port-A-Cath placement. EXAM: PORTABLE CHEST - 1 VIEW COMPARISON:  None. FINDINGS: The mediastinal contours are within normal limits. No cardiomegaly. Left subclavian port in place with catheter tip terminating at the cavoatrial junction. The lungs are clear bilaterally without evidence of focal consolidation, pleural effusion, or pneumothorax. No acute osseous abnormality. IMPRESSION: Status post left subclavian Port-A-Cath placement without evidence of pneumothorax. Electronically Signed   By: Ruthann Cancer MD   On: 03/22/2021  09:52   DG Fluoro Guide CV Line-No Report  Result Date: 03/22/2021 Fluoroscopy was utilized by the requesting physician.  No radiographic interpretation.   MR ABDOMEN MRCP W WO CONTAST  Result Date: 04/17/2021 CLINICAL DATA:  Evaluate mass within head of pancreas. History of right breast cancer. EXAM: MRI ABDOMEN WITHOUT AND WITH CONTRAST (INCLUDING MRCP) TECHNIQUE: Multiplanar multisequence MR imaging of the abdomen was performed both before and after the administration of intravenous contrast. Heavily T2-weighted images of the biliary and pancreatic ducts were obtained, and three-dimensional MRCP images were rendered by post processing. CONTRAST:  91m GADAVIST GADOBUTROL 1 MMOL/ML IV SOLN COMPARISON:  CT chest, abdomen and pelvis 03/20/2021. FINDINGS: Lower chest: No acute findings. Hepatobiliary: No suspicious liver abnormality. Small cysts noted within posterior right lobe of liver measuring 4 mm, image 9/18. Gallbladder appears normal. No biliary ductal dilatation. Pancreas: No main duct dilatation or inflammation. The cystic lesion within the head of pancreas is again noted. This has a maximum dimension of 2.3 cm, image 15/10. No signs of internal enhancement or mural nodularity. No additional pancreas lesions identified. Spleen:  Within normal limits in size and appearance. Adrenals/Urinary Tract: No masses identified. No evidence of hydronephrosis. Small cyst noted within posterior cortex of the right mid kidney measuring 5 mm, image 55/32. No suspicious mass or hydronephrosis. Stomach/Bowel: Stomach appears normal. No abnormal bowel distention or wall thickening identified. Vascular/Lymphatic: No pathologically enlarged lymph nodes identified. No abdominal aortic aneurysm demonstrated. Other:  No free fluid or fluid collections. Musculoskeletal: No suspicious bone lesions identified. The enhancing left breast lesion identified previously is partially visualized measuring approximately 2.5 cm, image  15/28. Previously this was measured at 4.4 cm. IMPRESSION: 1. Cystic lesion within the head of pancreas is again noted. No suspicious features identified. This is favored to represent a pseudocyst. Follow-up imaging in 6 months with repeat pancreas protocol MRI without and with contrast material is recommended. This recommendation follows ACR consensus guidelines: Management of Incidental Pancreatic Cysts: A White Paper of the ACR Incidental Findings Committee. J Am Coll Radiol 23785;88:502-774 2. No evidence for metastatic disease within the abdomen. 3. Partially visualized enhancing left breast lesion identified measuring approximately 2.5 cm. Previously this was measured at 4.4 cm. Electronically Signed   By: TKerby MoorsM.D.   On: 04/17/2021 12:05     ELIGIBLE FOR AVAILABLE RESEARCH PROTOCOL:no  ASSESSMENT: 45y.o. [place] woman status post right breast upper outer quadrant biopsy 02/23/2021 for a clinical T3 N1, stage IIB invasive ductal carcinoma,  grade 3, triple positive, with an MIB-1 of 35%  (a) right periareolar biopsy 02/23/2021 was benign/concordant  (b) left breast nodule biopsy 03/14/2021 benign  (c) Punch biopsy of the right areolar area 03/22/2021 no malignancy  (d) CT of the chest abdomen and pelvis 03/20/2021 shows no evidence of metastatic disease; there is 2.5 cm cystic pancreatic mass and a 0.4 cm right liver lobe lesion requiring further follow-up  (e) bone scan 03/25/2021 shows no bone metastases  (1) genetics testing 03/26/2021 through the  [employer]RNA[employer] panel offered by [facility] found no deleterious mutations in APC, ATM, AXIN2, BARD1, BMPR1A, BRCA1, BRCA2, BRIP1, CDH1, CDK4, CDKN2A, CHEK2, DICER1, EPCAM, GREM1, HOXB13, MEN1, MLH1, MSH2, MSH3, MSH6, MUTYH, NBN, NF1, NF2, NTHL1, PALB2, PMS2, POLD1, POLE, PTEN, RAD51C, RAD51D, RECQL, RET, SDHA, SDHAF2, SDHB, SDHC, SDHD, SMAD4, SMARCA4, STK11, TP53, TSC1, TSC2, and VHL. RNA data is routinely analyzed  for use in variant interpretation for all genes.   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  In addition the mass in her right breast is no longer palpable.  It is this is also extremely encouraging.  Accordingly she is proceeding to cycle 2 of quadruple therapy today.  She will return in 3 weeks for cycle 3 and I will see her again on treatment day.  I am not making any changes in her supportive medications at this point  I encouraged her to call us with any problems and particularly concerns regarding pain from the immune booster shot  Total encounter time 25 minutes.Sarajane Jews C. Lauralyn Shadowens, MD 04/17/2021 7:15 PM Medical Oncology and Hematology [facility] [place], [place] [address] Tel. [phone]    Fax. [phone]   This document serves as a record of services personally performed by Lurline Del, MD. It was created on his behalf by Wilburn Mylar, a trained medical scribe. The creation of this record is based on the scribe's personal observations and the provider's statements to them.   I, Lurline Del MD, have reviewed the above documentation for accuracy and completeness, and I agree with the above.   *Total Encounter Time as defined by the Centers for Medicare and Medicaid Services includes, in addition to the face-to-face time of a patient visit (documented in the note above) non-face-to-face time: obtaining and reviewing outside history, ordering and reviewing medications, tests or procedures, care coordination  (communications with other health care professionals or caregivers) and documentation in the medical record.

## 2021-04-17 ENCOUNTER — Other Ambulatory Visit: Payer: No Typology Code available for payment source

## 2021-04-17 ENCOUNTER — Inpatient Hospital Stay: Payer: No Typology Code available for payment source | Attending: Oncology

## 2021-04-17 ENCOUNTER — Inpatient Hospital Stay (HOSPITAL_BASED_OUTPATIENT_CLINIC_OR_DEPARTMENT_OTHER): Payer: No Typology Code available for payment source | Admitting: Oncology

## 2021-04-17 ENCOUNTER — Encounter: Payer: Self-pay | Admitting: Licensed Clinical Social Worker

## 2021-04-17 ENCOUNTER — Inpatient Hospital Stay: Payer: No Typology Code available for payment source

## 2021-04-17 VITALS — HR 94

## 2021-04-17 VITALS — BP 147/89 | HR 107 | Temp 97.7°F | Resp 18 | Ht 64.0 in | Wt 236.8 lb

## 2021-04-17 DIAGNOSIS — K769 Liver disease, unspecified: Secondary | ICD-10-CM | POA: Diagnosis not present

## 2021-04-17 DIAGNOSIS — Z170 Estrogen receptor positive status [ER+]: Secondary | ICD-10-CM

## 2021-04-17 DIAGNOSIS — C50411 Malignant neoplasm of upper-outer quadrant of right female breast: Secondary | ICD-10-CM | POA: Diagnosis present

## 2021-04-17 DIAGNOSIS — Z5111 Encounter for antineoplastic chemotherapy: Secondary | ICD-10-CM | POA: Diagnosis present

## 2021-04-17 DIAGNOSIS — Z79899 Other long term (current) drug therapy: Secondary | ICD-10-CM | POA: Insufficient documentation

## 2021-04-17 DIAGNOSIS — K862 Cyst of pancreas: Secondary | ICD-10-CM | POA: Diagnosis not present

## 2021-04-17 LAB — CBC WITH DIFFERENTIAL/PLATELET
Abs Immature Granulocytes: 0.04 K/uL (ref 0.00–0.07)
Basophils Absolute: 0 K/uL (ref 0.0–0.1)
Basophils Relative: 0 %
Eosinophils Absolute: 0 K/uL (ref 0.0–0.5)
Eosinophils Relative: 0 %
HCT: 33.9 % — ABNORMAL LOW (ref 36.0–46.0)
Hemoglobin: 11.5 g/dL — ABNORMAL LOW (ref 12.0–15.0)
Immature Granulocytes: 0 %
Lymphocytes Relative: 8 %
Lymphs Abs: 1 K/uL (ref 0.7–4.0)
MCH: 30.1 pg (ref 26.0–34.0)
MCHC: 33.9 g/dL (ref 30.0–36.0)
MCV: 88.7 fL (ref 80.0–100.0)
Monocytes Absolute: 0.3 K/uL (ref 0.1–1.0)
Monocytes Relative: 3 %
Neutro Abs: 10.8 K/uL — ABNORMAL HIGH (ref 1.7–7.7)
Neutrophils Relative %: 89 %
Platelets: 285 K/uL (ref 150–400)
RBC: 3.82 MIL/uL — ABNORMAL LOW (ref 3.87–5.11)
RDW: 15.4 % (ref 11.5–15.5)
WBC: 12.2 K/uL — ABNORMAL HIGH (ref 4.0–10.5)
nRBC: 0 % (ref 0.0–0.2)

## 2021-04-17 LAB — COMPREHENSIVE METABOLIC PANEL WITH GFR
ALT: 28 U/L (ref 0–44)
AST: 14 U/L — ABNORMAL LOW (ref 15–41)
Albumin: 3.5 g/dL (ref 3.5–5.0)
Alkaline Phosphatase: 66 U/L (ref 38–126)
Anion gap: 6 (ref 5–15)
BUN: 8 mg/dL (ref 6–20)
CO2: 25 mmol/L (ref 22–32)
Calcium: 9 mg/dL (ref 8.9–10.3)
Chloride: 108 mmol/L (ref 98–111)
Creatinine, Ser: 0.89 mg/dL (ref 0.44–1.00)
GFR, Estimated: >60 mL/min
Glucose, Bld: 157 mg/dL — ABNORMAL HIGH (ref 70–99)
Potassium: 4.1 mmol/L (ref 3.5–5.1)
Sodium: 139 mmol/L (ref 135–145)
Total Bilirubin: 0.3 mg/dL (ref 0.3–1.2)
Total Protein: 6.7 g/dL (ref 6.5–8.1)

## 2021-04-17 MED ORDER — SODIUM CHLORIDE 0.9 % IV SOLN
Freq: Once | INTRAVENOUS | Status: AC
  Filled 2021-04-17: qty 250

## 2021-04-17 MED ORDER — PALONOSETRON HCL INJECTION 0.25 MG/5ML
INTRAVENOUS | Status: AC
  Filled 2021-04-17: qty 5

## 2021-04-17 MED ORDER — DIPHENHYDRAMINE HCL 25 MG PO CAPS
25.0000 mg | ORAL_CAPSULE | Freq: Once | ORAL | Status: AC
  Administered 2021-04-17: 25 mg via ORAL

## 2021-04-17 MED ORDER — DIPHENHYDRAMINE HCL 25 MG PO CAPS
ORAL_CAPSULE | ORAL | Status: AC
  Filled 2021-04-17: qty 1

## 2021-04-17 MED ORDER — HEPARIN SOD (PORK) LOCK FLUSH 100 UNIT/ML IV SOLN
500.0000 [IU] | Freq: Once | INTRAVENOUS | Status: AC | PRN
  Administered 2021-04-17: 500 [IU]
  Filled 2021-04-17: qty 5

## 2021-04-17 MED ORDER — SODIUM CHLORIDE 0.9 % IV SOLN
150.0000 mg | Freq: Once | INTRAVENOUS | Status: AC
  Administered 2021-04-17: 150 mg via INTRAVENOUS
  Filled 2021-04-17: qty 150

## 2021-04-17 MED ORDER — PALONOSETRON HCL INJECTION 0.25 MG/5ML
0.2500 mg | Freq: Once | INTRAVENOUS | Status: AC
  Administered 2021-04-17: 0.25 mg via INTRAVENOUS

## 2021-04-17 MED ORDER — ACETAMINOPHEN 325 MG PO TABS
ORAL_TABLET | ORAL | Status: AC
  Filled 2021-04-17: qty 2

## 2021-04-17 MED ORDER — SODIUM CHLORIDE 0.9 % IV SOLN
10.0000 mg | Freq: Once | INTRAVENOUS | Status: AC
  Administered 2021-04-17: 10 mg via INTRAVENOUS
  Filled 2021-04-17: qty 10

## 2021-04-17 MED ORDER — SODIUM CHLORIDE 0.9 % IV SOLN
420.0000 mg | Freq: Once | INTRAVENOUS | Status: AC
  Administered 2021-04-17: 420 mg via INTRAVENOUS
  Filled 2021-04-17: qty 14

## 2021-04-17 MED ORDER — TRASTUZUMAB-ANNS CHEMO 150 MG IV SOLR
6.0000 mg/kg | Freq: Once | INTRAVENOUS | Status: AC
  Administered 2021-04-17: 651 mg via INTRAVENOUS
  Filled 2021-04-17: qty 31

## 2021-04-17 MED ORDER — SODIUM CHLORIDE 0.9% FLUSH
10.0000 mL | INTRAVENOUS | Status: DC | PRN
  Administered 2021-04-17: 10 mL
  Filled 2021-04-17: qty 10

## 2021-04-17 MED ORDER — SODIUM CHLORIDE 0.9 % IV SOLN
700.0000 mg | Freq: Once | INTRAVENOUS | Status: AC
  Administered 2021-04-17: 700 mg via INTRAVENOUS
  Filled 2021-04-17: qty 70

## 2021-04-17 MED ORDER — ACETAMINOPHEN 325 MG PO TABS
650.0000 mg | ORAL_TABLET | Freq: Once | ORAL | Status: AC
  Administered 2021-04-17: 650 mg via ORAL

## 2021-04-17 MED ORDER — SODIUM CHLORIDE 0.9 % IV SOLN
75.0000 mg/m2 | Freq: Once | INTRAVENOUS | Status: AC
  Administered 2021-04-17: 170 mg via INTRAVENOUS
  Filled 2021-04-17: qty 17

## 2021-04-17 NOTE — Progress Notes (Signed)
[facility] CSW Progress Note  Clinical Social Worker met with patient in infusion to provide 2nd set of [employer] cards. Patient continues to work on applications for cancer foundations.   Follow-up: CSW plans to see pt in infusion 5/24   Michelle E Zavala , LCSW 

## 2021-04-17 NOTE — Patient Instructions (Signed)
[facility] ONCOLOGY  Discharge Instructions: Thank you for choosing [facility] to provide your oncology and hematology care.   If you have a lab appointment with the [facility], please go directly to the [facility] and check in at the registration area.   Wear comfortable clothing and clothing appropriate for easy access to any Portacath or PICC line.   We strive to give you quality time with your provider. You may need to reschedule your appointment if you arrive late (15 or more minutes).  Arriving late affects you and other patients whose appointments are after yours.  Also, if you miss three or more appointments without notifying the office, you may be dismissed from the clinic at the provider's discretion.      For prescription refill requests, have your pharmacy contact our office and allow 72 hours for refills to be completed.    Today you received the following chemotherapy and/or immunotherapy agents carboplatin, trastuzumab, perjeta, taxotere.    To help prevent nausea and vomiting after your treatment, we encourage you to take your nausea medication as directed.  BELOW ARE SYMPTOMS THAT SHOULD BE REPORTED IMMEDIATELY: . *FEVER GREATER THAN 100.4 F (38 C) OR HIGHER . *CHILLS OR SWEATING . *NAUSEA AND VOMITING THAT IS NOT CONTROLLED WITH YOUR NAUSEA MEDICATION . *UNUSUAL SHORTNESS OF BREATH . *UNUSUAL BRUISING OR BLEEDING . *URINARY PROBLEMS (pain or burning when urinating, or frequent urination) . *BOWEL PROBLEMS (unusual diarrhea, constipation, pain near the anus) . TENDERNESS IN MOUTH AND THROAT WITH OR WITHOUT PRESENCE OF ULCERS (sore throat, sores in mouth, or a toothache) . UNUSUAL RASH, SWELLING OR PAIN  . UNUSUAL VAGINAL DISCHARGE OR ITCHING   Items with * indicate a potential emergency and should be followed up as soon as possible or go to the Emergency Department if any problems should occur.  Please show the CHEMOTHERAPY  ALERT CARD or IMMUNOTHERAPY ALERT CARD at check-in to the Emergency Department and triage nurse.  Should you have questions after your visit or need to cancel or reschedule your appointment, please contact [facility]  Dept: [phone]  and follow the prompts.  Office hours are 8:00 a.m. to 4:30 p.m. Monday - Friday. Please note that voicemails left after 4:00 p.m. may not be returned until the following business day.  We are closed weekends and major holidays. You have access to a nurse at all times for urgent questions. Please call the main number to the clinic Dept: [phone] and follow the prompts.   For any non-urgent questions, you may also contact your provider using MyChart. We now offer e-Visits for anyone 57 and older to request care online for non-urgent symptoms. For details visit [email or web address].   Also download the MyChart app! Go to the app store, search "MyChart", open the app, select [facility], and log in with your MyChart username and password.  Due to Covid, a mask is required upon entering the hospital/clinic. If you do not have a mask, one will be given to you upon arrival. For doctor visits, patients may have 1 support person aged 93 or older with them. For treatment visits, patients cannot have anyone with them due to current Covid guidelines and our immunocompromised population.

## 2021-04-19 ENCOUNTER — Other Ambulatory Visit: Payer: Self-pay

## 2021-04-19 ENCOUNTER — Inpatient Hospital Stay: Payer: No Typology Code available for payment source

## 2021-04-19 VITALS — BP 136/88 | HR 87 | Temp 98.6°F | Resp 19

## 2021-04-19 DIAGNOSIS — C50411 Malignant neoplasm of upper-outer quadrant of right female breast: Secondary | ICD-10-CM

## 2021-04-19 DIAGNOSIS — Z170 Estrogen receptor positive status [ER+]: Secondary | ICD-10-CM

## 2021-04-19 DIAGNOSIS — Z5111 Encounter for antineoplastic chemotherapy: Secondary | ICD-10-CM | POA: Diagnosis not present

## 2021-04-19 MED ORDER — PEGFILGRASTIM-BMEZ 6 MG/0.6ML ~~LOC~~ SOSY
PREFILLED_SYRINGE | SUBCUTANEOUS | Status: AC
  Filled 2021-04-19: qty 0.6

## 2021-04-19 MED ORDER — PEGFILGRASTIM-BMEZ 6 MG/0.6ML ~~LOC~~ SOSY
6.0000 mg | PREFILLED_SYRINGE | Freq: Once | SUBCUTANEOUS | Status: AC
  Administered 2021-04-19: 6 mg via SUBCUTANEOUS

## 2021-04-19 NOTE — Patient Instructions (Signed)
Pegfilgrastim injection What is this medicine? PEGFILGRASTIM (PEG fil gra stim) is a long-acting granulocyte colony-stimulating factor that stimulates the growth of neutrophils, a type of white blood cell important in the body's fight against infection. It is used to reduce the incidence of fever and infection in patients with certain types of cancer who are receiving chemotherapy that affects the bone marrow, and to increase survival after being exposed to high doses of radiation. This medicine may be used for other purposes; ask your health care provider or pharmacist if you have questions. COMMON BRAND NAME(S): Fulphila, Neulasta, Nyvepria, UDENYCA, Ziextenzo What should I tell my health care provider before I take this medicine? They need to know if you have any of these conditions:  kidney disease  latex allergy  ongoing radiation therapy  sickle cell disease  skin reactions to acrylic adhesives (On-Body Injector only)  an unusual or allergic reaction to pegfilgrastim, filgrastim, other medicines, foods, dyes, or preservatives  pregnant or trying to get pregnant  breast-feeding How should I use this medicine? This medicine is for injection under the skin. If you get this medicine at home, you will be taught how to prepare and give the pre-filled syringe or how to use the On-body Injector. Refer to the patient Instructions for Use for detailed instructions. Use exactly as directed. Tell your healthcare provider immediately if you suspect that the On-body Injector may not have performed as intended or if you suspect the use of the On-body Injector resulted in a missed or partial dose. It is important that you put your used needles and syringes in a special sharps container. Do not put them in a trash can. If you do not have a sharps container, call your pharmacist or healthcare provider to get one. Talk to your pediatrician regarding the use of this medicine in children. While this drug  may be prescribed for selected conditions, precautions do apply. Overdosage: If you think you have taken too much of this medicine contact a poison control center or emergency room at once. NOTE: This medicine is only for you. Do not share this medicine with others. What if I miss a dose? It is important not to miss your dose. Call your doctor or health care professional if you miss your dose. If you miss a dose due to an On-body Injector failure or leakage, a new dose should be administered as soon as possible using a single prefilled syringe for manual use. What may interact with this medicine? Interactions have not been studied. This list may not describe all possible interactions. Give your health care provider a list of all the medicines, herbs, non-prescription drugs, or dietary supplements you use. Also tell them if you smoke, drink alcohol, or use illegal drugs. Some items may interact with your medicine. What should I watch for while using this medicine? Your condition will be monitored carefully while you are receiving this medicine. You may need blood work done while you are taking this medicine. Talk to your health care provider about your risk of cancer. You may be more at risk for certain types of cancer if you take this medicine. If you are going to need a MRI, CT scan, or other procedure, tell your doctor that you are using this medicine (On-Body Injector only). What side effects may I notice from receiving this medicine? Side effects that you should report to your doctor or health care professional as soon as possible:  allergic reactions (skin rash, itching or hives, swelling of   the face, lips, or tongue)  back pain  dizziness  fever  pain, redness, or irritation at site where injected  pinpoint red spots on the skin  red or dark-brown urine  shortness of breath or breathing problems  stomach or side pain, or pain at the shoulder  swelling  tiredness  trouble  passing urine or change in the amount of urine  unusual bruising or bleeding Side effects that usually do not require medical attention (report to your doctor or health care professional if they continue or are bothersome):  bone pain  muscle pain This list may not describe all possible side effects. Call your doctor for medical advice about side effects. You may report side effects to FDA at 1-800-FDA-1088. Where should I keep my medicine? Keep out of the reach of children. If you are using this medicine at home, you will be instructed on how to store it. Throw away any unused medicine after the expiration date on the label. NOTE: This sheet is a summary. It may not cover all possible information. If you have questions about this medicine, talk to your doctor, pharmacist, or health care provider.  2021 Elsevier/Gold Standard (2019-12-24 13:20:51)  

## 2021-05-02 ENCOUNTER — Inpatient Hospital Stay: Payer: No Typology Code available for payment source

## 2021-05-02 ENCOUNTER — Inpatient Hospital Stay (HOSPITAL_BASED_OUTPATIENT_CLINIC_OR_DEPARTMENT_OTHER): Payer: No Typology Code available for payment source | Admitting: Oncology

## 2021-05-02 ENCOUNTER — Telehealth: Payer: Self-pay | Admitting: *Deleted

## 2021-05-02 ENCOUNTER — Other Ambulatory Visit: Payer: Self-pay

## 2021-05-02 ENCOUNTER — Telehealth: Payer: Self-pay | Admitting: Oncology

## 2021-05-02 ENCOUNTER — Other Ambulatory Visit: Payer: Self-pay | Admitting: Oncology

## 2021-05-02 VITALS — BP 130/80 | HR 110 | Temp 98.1°F | Resp 20 | Ht 64.0 in | Wt 234.4 lb

## 2021-05-02 DIAGNOSIS — C50411 Malignant neoplasm of upper-outer quadrant of right female breast: Secondary | ICD-10-CM | POA: Diagnosis not present

## 2021-05-02 DIAGNOSIS — Z170 Estrogen receptor positive status [ER+]: Secondary | ICD-10-CM

## 2021-05-02 DIAGNOSIS — Z5111 Encounter for antineoplastic chemotherapy: Secondary | ICD-10-CM | POA: Diagnosis not present

## 2021-05-02 LAB — CBC WITH DIFFERENTIAL/PLATELET
Abs Immature Granulocytes: 0.03 10*3/uL (ref 0.00–0.07)
Basophils Absolute: 0 10*3/uL (ref 0.0–0.1)
Basophils Relative: 0 %
Eosinophils Absolute: 0 10*3/uL (ref 0.0–0.5)
Eosinophils Relative: 0 %
HCT: 35.2 % — ABNORMAL LOW (ref 36.0–46.0)
Hemoglobin: 12 g/dL (ref 12.0–15.0)
Immature Granulocytes: 0 %
Lymphocytes Relative: 18 %
Lymphs Abs: 1.8 10*3/uL (ref 0.7–4.0)
MCH: 30.9 pg (ref 26.0–34.0)
MCHC: 34.1 g/dL (ref 30.0–36.0)
MCV: 90.7 fL (ref 80.0–100.0)
Monocytes Absolute: 0.4 10*3/uL (ref 0.1–1.0)
Monocytes Relative: 4 %
Neutro Abs: 8.1 10*3/uL — ABNORMAL HIGH (ref 1.7–7.7)
Neutrophils Relative %: 78 %
Platelets: 136 10*3/uL — ABNORMAL LOW (ref 150–400)
RBC: 3.88 MIL/uL (ref 3.87–5.11)
RDW: 17.9 % — ABNORMAL HIGH (ref 11.5–15.5)
WBC: 10.3 10*3/uL (ref 4.0–10.5)
nRBC: 0 % (ref 0.0–0.2)

## 2021-05-02 LAB — COMPREHENSIVE METABOLIC PANEL
ALT: 13 U/L (ref 0–44)
AST: 13 U/L — ABNORMAL LOW (ref 15–41)
Albumin: 3.3 g/dL — ABNORMAL LOW (ref 3.5–5.0)
Alkaline Phosphatase: 71 U/L (ref 38–126)
Anion gap: 7 (ref 5–15)
BUN: 6 mg/dL (ref 6–20)
CO2: 28 mmol/L (ref 22–32)
Calcium: 8.8 mg/dL — ABNORMAL LOW (ref 8.9–10.3)
Chloride: 107 mmol/L (ref 98–111)
Creatinine, Ser: 0.86 mg/dL (ref 0.44–1.00)
GFR, Estimated: >60 mL/min (ref >60)
Glucose, Bld: 122 mg/dL — ABNORMAL HIGH (ref 70–99)
Potassium: 3.5 mmol/L (ref 3.5–5.1)
Sodium: 142 mmol/L (ref 135–145)
Total Bilirubin: 0.4 mg/dL (ref 0.3–1.2)
Total Protein: 5.9 g/dL — ABNORMAL LOW (ref 6.5–8.1)

## 2021-05-02 MED ORDER — METHYLPREDNISOLONE 4 MG PO TBPK: ORAL_TABLET | ORAL | 1 refills | Status: DC

## 2021-05-02 NOTE — Telephone Encounter (Signed)
This RN spoke with pt per her call stating onset of itching post taking new vitamins yesterday. " I thought it would be good to take vitamins to help my immune system "  She obtained a combination of Vitamin C, zinc, B3 and vitamin K2 from [facility].  She took benadryl- itching continued into later in day- retook benadryl- then later she developed hives- She woke up during the night with noted facial swelling and some difficulty swallowing- and considered going to the ER. Her temp was 100.6. Symptoms improved and she did not proceed to the ER.  This AM symptoms are not worse- she still has some facial swelling but no difficulties swallowing or breathing, Temp this am is 100.2  Pt is currently d14 of cycle 1 of TCHP with udenyka support.  She states above symptoms did not occur until she took the new vitamins yesterday.  This RN will review with MD for further recommendations.

## 2021-05-02 NOTE — Progress Notes (Signed)
Diane Miranda  [phone] [phone]     ID: Khala RAVYN NIKKEL DOB: [birth year]  MR#: [id number]  UUE#:[id number]  Patient Care Team: [name], MD as PCP - General (Internal Medicine) Mauro Kaufmann, RN as Oncology Nurse Navigator Rockwell Germany, RN as Oncology Nurse Navigator Stark Klein, MD as Consulting Physician (General Surgery) Remy Voiles, Virgie Dad, MD as Consulting Physician (Oncology) Gery Pray, MD as Consulting Physician (Radiation Oncology) Wylene Simmer, MD as Consulting Physician (Orthopedic Surgery) Chauncey Cruel, MD OTHER MD:  CHIEF COMPLAINT: triple positive breast cancer  CURRENT TREATMENT: Neoadjuvant chemotherapy   INTERVAL HISTORY: Donnae called today to tell us that she had developed itching, redness, welts, eye swelling, and her throat was swelling as well.  She took a mixed vitamin pill that she bought at [facility] containing vitamin C zinc vitamin D and vitamin K.  An hour after taking that she started to have all the symptoms and this morning her throat was getting a little bit thick so she thought she might end up in the emergency room.  She took some Benadryl and things improved but I thought it would be better if she could come in to be evaluated.  Recall she began neoadjuvant chemotherapy, consisting of carboplatin, docetaxel, trastuzumab and pertuzumab every 21 days x 6, on 03/28/2021.  Today is.  Day 16 cycle 2  Abdominal MRI 04/16/2021 showed 1. Cystic lesion within the head of pancreas is again noted. No suspicious features identified. This is favored to represent a pseudocyst. Follow-up imaging in 6 months with repeat pancreas protocol MRI without and with contrast material is recommended. This recommendation follows ACR consensus guidelines: Management of Incidental Pancreatic Cysts: A White Paper of the ACR Incidental Findings Committee. J Am Coll Radiol 9150;56:979-480. 2. No evidence for  metastatic disease within the abdomen. 3. Partially visualized enhancing left breast lesion identified measuring approximately 2.5 cm. Previously this was measured at 4.4 cm.  REVIEW OF SYSTEMS: Angeliz did much better with her second cycle of chemotherapy.  Specifically she did not have that terrific pain secondary to the growth factor.  Of course she is using better pain medicines this time.  We have not been able to palpate the mass which in addition to being now not palpable is measurably smaller just noted on the MRI of the abdomen above.  Currently she denies unusual headaches visual changes cough stridor or shortness of breath.  There is no pleurisy.  A detailed review of systems was otherwise stable   COVID 19 VACCINATION STATUS: refuses vaccination; infection 10/2019   HISTORY OF CURRENT ILLNESS: From the original intake note:  Eunie M Peffley herself palpated a right breast mass and noted associated pain. She underwent bilateral diagnostic mammography with tomography and right breast ultrasonography at [facility] on 02/15/2021 showing: breast density category B; palpable 5.4 cm mass within outer right breast; two abnormal right axillary lymph nodes; 1.8 cm indeterminate hypoechoic area within upper-outer right breast; no evidence of left breast malignancy.  Accordingly on 02/23/2021 she proceeded to biopsy of the right breast areas in question. The pathology from this procedure ([id number]) showed:  1. Right Breast, 11 o'clock  - adenosis and columnar cell hyperplasia; this was concordant 2. Right Breast, 9 o'clock  - invasive ductal carcinoma, grade 3  - Prognostic indicators significant for: estrogen receptor, 40% positive with moderate staining intensity and progesterone receptor, 60% positive with strong staining intensity. Proliferation marker Ki67 at 35%. HER2 positive by  immunohistochemistry (3+). 3. Right Axilla, lymph node  - invasive ductal carcinoma involving  nodal tissue  She underwent breast MRI on 03/04/2021 showing: breast composition B; 4.7 cm mass in 8-9 o'clock position of posterior right breast represents recently diagnosed malignancy; two adjacent abnormal right axillary lymph nodes, one of which represents the biopsy-proven metastatic node; significant skin thickening in periareolar right breast, with significant increased T2 signal consistent with edema; mild abnormal enhancement within thickened skin, nonspecific.  There was also a 1.1 cm nonspecific enhancing mass in left breast at 2-3 o'clock.  Cancer Staging Malignant neoplasm of upper-outer quadrant of right breast in female, estrogen receptor positive ([place]) Staging form: Breast, AJCC 8th Edition - Clinical: Stage IIB (cT3, cN1, cM0, G3, ER+, PR+, HER2+) - Signed by Chauncey Cruel, MD on 03/07/2021 Histologic grading system: 3 grade system  The patient's subsequent history is as detailed below.   PAST MEDICAL HISTORY: Past Medical History:  Diagnosis Date  . Allergy 2000  . Asthma    as child  . Breast cancer ([place])   . Family history of lung cancer 44/24/2022  . Family history of uterine cancer 44/24/2022  . GERD (gastroesophageal reflux disease)   . Headache   . History of COVID-19   . Hyperlipidemia   . Hypertension   . Prediabetes   . right breast ca dx'd 44-Mar-2022  . Trimalleolar fracture of ankle, closed, left, initial encounter     PAST SURGICAL HISTORY: Past Surgical History:  Procedure Laterality Date  . BREAST BIOPSY Right 03/22/2021   Procedure: PUNCH BIOPSY OF RIGHT AREOLA;  Surgeon: Stark Klein, MD;  Location: [facility];  Service: General;  Laterality: Right;  . FRACTURE SURGERY  2018  . NO PAST SURGERIES    . ORIF ANKLE FRACTURE Left 07/10/2017   Procedure: OPEN REDUCTION INTERNAL FIXATION (ORIF) trimallolar ANKLE FRACTURE;  Surgeon: Wylene Simmer, MD;  Location: [facility];  Service: Orthopedics;  Laterality: Left;  .  PORTACATH PLACEMENT Left 03/22/2021   Procedure: INSERTION PORT-A-CATH;  Surgeon: Stark Klein, MD;  Location: [facility];  Service: General;  Laterality: Left;    FAMILY HISTORY: Family History  Problem Relation Age of Onset  . Diabetes Father   . Hypertension Father   . Early death Mother   . Heart disease Mother   . Hypertension Mother   . Hearing loss Maternal Grandmother   . Hypertension Maternal Grandmother   . Uterine cancer Other 7       MGM's sister  . Throat cancer Other        MGM's brother; dx after 52  . Lung cancer Other        PGM's sister; dx 45s  . Diabetes Brother    Her father is living at age 33 as of 2021/03/14. Her mother died at age 33 from [place]. Lissy has two brothers (and no sisters). She reports endometrial cancer in a maternal great aunt and lung cancer in a paternal aunt. There is no other family history of cancer to her knowledge.   GYNECOLOGIC HISTORY:  No LMP recorded. Menarche: 45 years old Age at first live birth: 45 years old [place] P 2 LMP early 03-14-2021 (has since had IUD placed) Contraceptive: IUD in place [place]), previously used pills from 1996-2006 HRT n/a  Hysterectomy? no BSO? 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 She is single. She lives at home with  daughter Joylene Draft (age 47), adopted daughter Hetty Blend (age 50), and the patient's maternal grandmother, who has significant dementia issues. Daughter Levy Pupa, age 66, works in Press photographer in [place]. 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    ADVANCED DIRECTIVES: not in place; however the patient made it clear during her 03/07/2021 visit that she would not accept blood product transfusions for any reason including immediately life saving reasons   HEALTH MAINTENANCE: Social History   Tobacco Use  . Smoking status: Never Smoker  . Smokeless tobacco: Never Used  Vaping Use  . Vaping Use: Never used   Substance Use Topics  . Alcohol use: Not Currently    Alcohol/week: 0.0 standard drinks    Comment: social  . Drug use: No     Colonoscopy: n/a (age)  PAP: 2020  Bone density: n/a (age)   Allergies  Allergen Reactions  . Hydrochlorothiazide Anaphylaxis and Swelling    Current Outpatient Medications  Medication Sig Dispense Refill  . acetaminophen (TYLENOL) 500 MG tablet Take 1 tablet (500 mg total) by mouth 3 (three) times daily with meals as needed. Take with aleve 220 mg tablet 90 tablet 0  . carvedilol (COREG) 3.125 MG tablet Take 1 tablet (3.125 mg total) by mouth 2 (two) times daily with a meal. 60 tablet 0  . dexamethasone (DECADRON) 4 MG tablet Take 2 tablets (8 mg total) by mouth 2 (two) times daily. Start the day before Taxotere. Then take daily x 3 days after chemotherapy. 30 tablet 1  . Fexofenadine HCl (ALLEGRA ALLERGY PO) Take by mouth.    . lidocaine-prilocaine (EMLA) cream Apply to affected area once 30 g 3  . lisinopril (ZESTRIL) 20 MG tablet Take 1 tablet (20 mg total) by mouth every morning. 30 tablet 0  . loratadine (CLARITIN) 10 MG tablet Take 1 tablet (10 mg total) by mouth daily. (Patient taking differently: Take 10 mg by mouth daily as needed.) 90 tablet 4  . methylPREDNISolone (MEDROL DOSEPAK) 4 MG TBPK tablet Take as directed 21 tablet 1  . Multiple Vitamins-Minerals (MULTIVITAMIN WITH MINERALS) tablet Take 1 tablet by mouth daily.    . ondansetron (ZOFRAN) 8 MG tablet Take 1 tablet (8 mg total) by mouth 2 (two) times daily as needed (Nausea or vomiting). Start on the third day after chemotherapy. 30 tablet 1  . oxyCODONE (OXY IR/ROXICODONE) 5 MG immediate release tablet Take 1 tablet (5 mg total) by mouth every 6 (six) hours as needed for severe pain. 5 tablet 0  . prochlorperazine (COMPAZINE) 10 MG tablet Take 1 tablet (10 mg total) by mouth every 6 (six) hours as needed (Nausea or vomiting). 30 tablet 1  . rosuvastatin (CRESTOR) 10 MG tablet Take 10 mg by  mouth daily.    . Turmeric (QC TUMERIC COMPLEX PO) Take by mouth.    . valACYclovir (VALTREX) 1000 MG tablet Take 1 tablet (1,000 mg total) by mouth daily. 60 tablet 1   No current facility-administered medications for this visit.    OBJECTIVE: African-American woman in no acute distress  Vitals:   05/02/21 1436  BP: 130/80  Pulse: (!) 110  Resp: 20  Temp: 98.1 F (36.7 C)  SpO2: 98%     Body mass index is 40.23 kg/m.   Wt Readings from Last 3 Encounters:  05/02/21 234 lb 6.4 oz (106.3 kg)  04/17/21 236 lb 12.8 oz (107.4 kg)  04/12/21 235 lb 6.4 oz (106.8 kg)      ECOG FS:1 - Symptomatic but completely ambulatory  Sclerae unicteric, EOMs intact No stridor No cervical or supraclavicular adenopathy Lungs no rales or rhonchi Heart regular rate and rhythm Abd soft, nontender, positive bowel sounds MSK no focal spinal tenderness, no upper extremity lymphedema Neuro: nonfocal, well oriented, appropriate affect Breasts: Again I am unable to palpate a mass in the previously palpated area of the right breast  LAB RESULTS:  CMP     Component Value Date/Time   NA 142 05/02/2021 1404   K 3.5 05/02/2021 1404   CL 107 05/02/2021 1404   CO2 28 05/02/2021 1404   GLUCOSE 122 (H) 05/02/2021 1404   GLUCOSE 105 (H) 11/12/2006 1022   BUN 6 05/02/2021 1404   CREATININE 0.86 05/02/2021 1404   CREATININE 0.92 03/07/2021 1219   CALCIUM 8.8 (L) 05/02/2021 1404   PROT 5.9 (L) 05/02/2021 1404   ALBUMIN 3.3 (L) 05/02/2021 1404   AST 13 (L) 05/02/2021 1404   AST 13 (L) 03/07/2021 1219   ALT 13 05/02/2021 1404   ALT 16 03/07/2021 1219   ALKPHOS 71 05/02/2021 1404   BILITOT 0.4 05/02/2021 1404   BILITOT 0.6 03/07/2021 1219   GFRNONAA >60 05/02/2021 1404   GFRNONAA >60 03/07/2021 1219   GFRAA >90 01/30/2015 1319    No results found for: TOTALPROTELP, ALBUMINELP, A1GS, A2GS, BETS, BETA2SER, GAMS, MSPIKE, SPEI  Lab Results  Component Value Date   WBC 10.3 05/02/2021   NEUTROABS  8.1 (H) 05/02/2021   HGB 12.0 05/02/2021   HCT 35.2 (L) 05/02/2021   MCV 90.7 05/02/2021   PLT 136 (L) 05/02/2021    No results found for: LABCA2  No components found for: [id number]  No results for input(s): INR in the last 168 hours.  No results found for: LABCA2  No results found for: KFM403  No results found for: FVO360  No results found for: OVP034  No results found for: CA2729  No components found for: HGQUANT  No results found for: CEA1 / No results found for: CEA1   No results found for: AFPTUMOR  No results found for: CHROMOGRNA  No results found for: KPAFRELGTCHN, LAMBDASER, KAPLAMBRATIO (kappa/lambda light chains)  No results found for: HGBA, HGBA2QUANT, HGBFQUANT, HGBSQUAN (Hemoglobinopathy evaluation)   No results found for: LDH  No results found for: IRON, TIBC, IRONPCTSAT (Iron and TIBC)  No results found for: FERRITIN  Urinalysis    Component Value Date/Time   COLORURINE YELLOW 01/30/2015 [place] 01/30/2015 1343   LABSPEC 1.011 01/30/2015 1343   PHURINE 7.5 01/30/2015 1343   GLUCOSEU NEGATIVE 01/30/2015 1343   HGBUR NEGATIVE 01/30/2015 1343   BILIRUBINUR NEGATIVE 01/30/2015 1343   KETONESUR NEGATIVE 01/30/2015 1343   PROTEINUR NEGATIVE 01/30/2015 1343   UROBILINOGEN 0.2 01/30/2015 1343   NITRITE NEGATIVE 01/30/2015 1343   LEUKOCYTESUR SMALL (A) 01/30/2015 1343    STUDIES: MR 3D Recon At Scanner  Result Date: 04/17/2021 CLINICAL DATA:  Evaluate mass within head of pancreas. History of right breast cancer. EXAM: MRI ABDOMEN WITHOUT AND WITH CONTRAST (INCLUDING MRCP) TECHNIQUE: Multiplanar multisequence MR imaging of the abdomen was performed both before and after the administration of intravenous contrast. Heavily T2-weighted images of the biliary and pancreatic ducts were obtained, and three-dimensional MRCP images were rendered by post processing. CONTRAST:  43m GADAVIST GADOBUTROL 1 MMOL/ML IV SOLN COMPARISON:  CT  chest, abdomen and pelvis 03/20/2021. FINDINGS: Lower chest: No acute findings. Hepatobiliary: No suspicious liver abnormality. Small cysts noted within posterior right lobe of liver measuring 4 mm, image 9/18. Gallbladder  appears normal. No biliary ductal dilatation. Pancreas: No main duct dilatation or inflammation. The cystic lesion within the head of pancreas is again noted. This has a maximum dimension of 2.3 cm, image 15/10. No signs of internal enhancement or mural nodularity. No additional pancreas lesions identified. Spleen:  Within normal limits in size and appearance. Adrenals/Urinary Tract: No masses identified. No evidence of hydronephrosis. Small cyst noted within posterior cortex of the right mid kidney measuring 5 mm, image 55/32. No suspicious mass or hydronephrosis. Stomach/Bowel: Stomach appears normal. No abnormal bowel distention or wall thickening identified. Vascular/Lymphatic: No pathologically enlarged lymph nodes identified. No abdominal aortic aneurysm demonstrated. Other:  No free fluid or fluid collections. Musculoskeletal: No suspicious bone lesions identified. The enhancing left breast lesion identified previously is partially visualized measuring approximately 2.5 cm, image 15/28. Previously this was measured at 4.4 cm. IMPRESSION: 1. Cystic lesion within the head of pancreas is again noted. No suspicious features identified. This is favored to represent a pseudocyst. Follow-up imaging in 6 months with repeat pancreas protocol MRI without and with contrast material is recommended. This recommendation follows ACR consensus guidelines: Management of Incidental Pancreatic Cysts: A White Paper of the ACR Incidental Findings Committee. J Am Coll Radiol 2706;23:762-831. 2. No evidence for metastatic disease within the abdomen. 3. Partially visualized enhancing left breast lesion identified measuring approximately 2.5 cm. Previously this was measured at 4.4 cm. Electronically Signed   By:  Kerby Moors M.D.   On: 04/17/2021 12:05   MR ABDOMEN MRCP W WO CONTAST  Result Date: 04/17/2021 CLINICAL DATA:  Evaluate mass within head of pancreas. History of right breast cancer. EXAM: MRI ABDOMEN WITHOUT AND WITH CONTRAST (INCLUDING MRCP) TECHNIQUE: Multiplanar multisequence MR imaging of the abdomen was performed both before and after the administration of intravenous contrast. Heavily T2-weighted images of the biliary and pancreatic ducts were obtained, and three-dimensional MRCP images were rendered by post processing. CONTRAST:  50m GADAVIST GADOBUTROL 1 MMOL/ML IV SOLN COMPARISON:  CT chest, abdomen and pelvis 03/20/2021. FINDINGS: Lower chest: No acute findings. Hepatobiliary: No suspicious liver abnormality. Small cysts noted within posterior right lobe of liver measuring 4 mm, image 9/18. Gallbladder appears normal. No biliary ductal dilatation. Pancreas: No main duct dilatation or inflammation. The cystic lesion within the head of pancreas is again noted. This has a maximum dimension of 2.3 cm, image 15/10. No signs of internal enhancement or mural nodularity. No additional pancreas lesions identified. Spleen:  Within normal limits in size and appearance. Adrenals/Urinary Tract: No masses identified. No evidence of hydronephrosis. Small cyst noted within posterior cortex of the right mid kidney measuring 5 mm, image 55/32. No suspicious mass or hydronephrosis. Stomach/Bowel: Stomach appears normal. No abnormal bowel distention or wall thickening identified. Vascular/Lymphatic: No pathologically enlarged lymph nodes identified. No abdominal aortic aneurysm demonstrated. Other:  No free fluid or fluid collections. Musculoskeletal: No suspicious bone lesions identified. The enhancing left breast lesion identified previously is partially visualized measuring approximately 2.5 cm, image 15/28. Previously this was measured at 4.4 cm. IMPRESSION: 1. Cystic lesion within the head of pancreas is again  noted. No suspicious features identified. This is favored to represent a pseudocyst. Follow-up imaging in 6 months with repeat pancreas protocol MRI without and with contrast material is recommended. This recommendation follows ACR consensus guidelines: Management of Incidental Pancreatic Cysts: A White Paper of the ACR Incidental Findings Committee. J Am Coll Radiol 25176;16:073-710 2. No evidence for metastatic disease within the abdomen. 3. Partially visualized enhancing left breast  lesion identified measuring approximately 2.5 cm. Previously this was measured at 4.4 cm. Electronically Signed   By: Kerby Moors M.D.   On: 04/17/2021 12:05     ELIGIBLE FOR AVAILABLE RESEARCH PROTOCOL:no  ASSESSMENT: 45 y.o. [place] woman status post right breast upper outer quadrant biopsy 02/23/2021 for a clinical T3 N1, stage IIB invasive ductal carcinoma, grade 3, triple positive, with an MIB-1 of 35%  (a) right periareolar biopsy 02/23/2021 was benign/concordant  (b) left breast nodule biopsy 03/14/2021 benign  (c) Punch biopsy of the right areolar area 03/22/2021 no malignancy  (d) CT of the chest abdomen and pelvis 03/20/2021 shows no evidence of metastatic disease; there is 2.5 cm cystic pancreatic mass and a 0.4 cm right liver lobe lesion requiring further follow-up  (e) bone scan 03/25/2021 shows no bone metastases  (1) genetics testing 03/26/2021 through the  [employer]RNA[employer] panel offered by [facility] found no deleterious mutations in APC, ATM, AXIN2, BARD1, BMPR1A, BRCA1, BRCA2, BRIP1, CDH1, CDK4, CDKN2A, CHEK2, DICER1, EPCAM, GREM1, HOXB13, MEN1, MLH1, MSH2, MSH3, MSH6, MUTYH, NBN, NF1, NF2, NTHL1, PALB2, PMS2, POLD1, POLE, PTEN, RAD51C, RAD51D, RECQL, RET, SDHA, SDHAF2, SDHB, SDHC, SDHD, SMAD4, SMARCA4, STK11, TP53, TSC1, TSC2, and VHL. RNA data is routinely analyzed for use in variant interpretation for all genes.   (2) neoadjuvant chemotherapy consisting of carboplatin,  docetaxel, trastuzumab and pertuzumab every 21 days x 6 beginning 03/28/2021  (3) anti-HER-2 immunotherapy to continue to total 1 year  (a) echo 03/13/2021 showed an ejection fraction in the 60- 65% range  (4) definitive surgery to follow  (5) adjuvant radiation  (6) antiestrogens   PLAN: Savayah did much better with her second cycle of chemotherapy.  She does have 1 loose bowel movement some days after treatment but she has not had significant diarrhea so we are continuing the Pertuzumab as well as the trastuzumab.  The rash welts and swelling that she experiences clearly associated temporally with her multivitamin pill.  I suggested she take back to [facility] and let them know that she had this reaction because they may want to stop selling that brand.  I think braces will benefit from a Medrol Dosepak and we have written for that.  She will see me again 05/08/2022 and she will receive her third cycle of chemotherapy at that time  Total encounter time today 20 minutes.Sarajane Jews C. Bekka Qian, MD 05/02/2021 3:10 PM Medical Oncology and Hematology [facility] [place], [place] [address] Tel. [phone]    Fax. [phone]   This document serves as a record of services personally performed by Lurline Del, MD. It was created on his behalf by Wilburn Mylar, a trained medical scribe. The creation of this record is based on the scribe's personal observations and the provider's statements to them.   I, Lurline Del MD, have reviewed the above documentation for accuracy and completeness, and I agree with the above.   *Total Encounter Time as defined by the Centers for Medicare and Medicaid Services includes, in addition to the face-to-face time of a patient visit (documented in the note above) non-face-to-face time: obtaining and reviewing outside history, ordering and reviewing medications, tests or procedures, care coordination (communications  with other health care professionals or caregivers) and documentation in the medical record.

## 2021-05-02 NOTE — Telephone Encounter (Signed)
Scheduled appts per 5/18 sch msg. Called pt, no answer and mailbox was full so unable to leave a msg. Will have updated calendar printed for pt at next visit.

## 2021-05-05 ENCOUNTER — Other Ambulatory Visit: Payer: Self-pay | Admitting: Cardiology

## 2021-05-05 DIAGNOSIS — I10 Essential (primary) hypertension: Secondary | ICD-10-CM

## 2021-05-05 DIAGNOSIS — R002 Palpitations: Secondary | ICD-10-CM

## 2021-05-05 DIAGNOSIS — R000 Tachycardia, unspecified: Secondary | ICD-10-CM

## 2021-05-07 NOTE — Progress Notes (Signed)
[place]  [phone] [phone]     ID: Ketzaly SUNDEE GARLAND DOB: 1976/06/02  MR#: [id number]  LPF#:[id number]  Patient Care Team: [name], MD as PCP - General (Internal Medicine) Mauro Kaufmann, RN as Oncology Nurse Navigator Rockwell Germany, RN as Oncology Nurse Navigator Stark Klein, MD as Consulting Physician (General Surgery) Cammeron Greis, Virgie Dad, MD as Consulting Physician (Oncology) Gery Pray, MD as Consulting Physician (Radiation Oncology) Wylene Simmer, MD as Consulting Physician (Orthopedic Surgery) Chauncey Cruel, MD OTHER MD:  CHIEF COMPLAINT: triple positive breast cancer  CURRENT TREATMENT: Neoadjuvant chemotherapy   INTERVAL HISTORY: Diane Miranda returns today for follow up and treatment of her triple positive breast cancer.  Recall she began neoadjuvant chemotherapy, consisting of carboplatin, docetaxel, trastuzumab and pertuzumab every 21 days x 6, on 03/28/2021.  Today is day 1 cycle 3.   REVIEW OF SYSTEMS: Denene is still having some epiphora.  It is not any worse.  She has not developed any peripheral neuropathy.  She is very active, goes around with her children to various markets on the weekend, and also is able to take walks.  Her rash has completely disappeared and that she has had no more swelling problems after completing her Medrol Dosepak.  Detailed review of systems today was otherwise stable.   COVID 19 VACCINATION STATUS: refuses vaccination; infection 10/2019   HISTORY OF CURRENT ILLNESS: From the original intake note:  Diane Miranda herself palpated a right breast mass and noted associated pain. She underwent bilateral diagnostic mammography with tomography and right breast ultrasonography at [facility] on 02/15/2021 showing: breast density category B; palpable 5.4 cm mass within outer right breast; two abnormal right axillary lymph nodes; 1.8 cm indeterminate hypoechoic area  within upper-outer right breast; no evidence of left breast malignancy.  Accordingly on 02/23/2021 she proceeded to biopsy of the right breast areas in question. The pathology from this procedure ([id number]) showed:  1. Right Breast, 11 o'clock  - adenosis and columnar cell hyperplasia; this was concordant 2. Right Breast, 9 o'clock  - invasive ductal carcinoma, grade 3  - Prognostic indicators significant for: estrogen receptor, 40% positive with moderate staining intensity and progesterone receptor, 60% positive with strong staining intensity. Proliferation marker Ki67 at 35%. HER2 positive by immunohistochemistry (3+). 3. Right Axilla, lymph node  - invasive ductal carcinoma involving nodal tissue  She underwent breast MRI on 03/04/2021 showing: breast composition B; 4.7 cm mass in 8-9 o'clock position of posterior right breast represents recently diagnosed malignancy; two adjacent abnormal right axillary lymph nodes, one of which represents the biopsy-proven metastatic node; significant skin thickening in periareolar right breast, with significant increased T2 signal consistent with edema; mild abnormal enhancement within thickened skin, nonspecific.  There was also a 1.1 cm nonspecific enhancing mass in left breast at 2-3 o'clock.  Cancer Staging Malignant neoplasm of upper-outer quadrant of right breast in female, estrogen receptor positive ([name]) Staging form: Breast, AJCC 8th Edition - Clinical: Stage IIB (cT3, cN1, cM0, G3, ER+, PR+, HER2+) - Signed by Chauncey Cruel, MD on 03/07/2021 Histologic grading system: 3 grade system  The patient's subsequent history is as detailed below.   PAST MEDICAL HISTORY: Past Medical History:  Diagnosis Date  . Allergy 2000  . Asthma    as child  . Breast cancer ([place])   . Family history of lung cancer 03/08/2021  . Family history of uterine cancer 03/08/2021  . GERD (gastroesophageal reflux disease)   .  Headache   . History of COVID-19   .  Hyperlipidemia   . Hypertension   . Prediabetes   . right breast ca dx'd 03-17-21  . Trimalleolar fracture of ankle, closed, left, initial encounter     PAST SURGICAL HISTORY: Past Surgical History:  Procedure Laterality Date  . BREAST BIOPSY Right 03/22/2021   Procedure: PUNCH BIOPSY OF RIGHT AREOLA;  Surgeon: Stark Klein, MD;  Location: [facility];  Service: General;  Laterality: Right;  . FRACTURE SURGERY  2018  . NO PAST SURGERIES    . ORIF ANKLE FRACTURE Left 07/10/2017   Procedure: OPEN REDUCTION INTERNAL FIXATION (ORIF) trimallolar ANKLE FRACTURE;  Surgeon: Wylene Simmer, MD;  Location: [place];  Service: Orthopedics;  Laterality: Left;  . PORTACATH PLACEMENT Left 03/22/2021   Procedure: INSERTION PORT-A-CATH;  Surgeon: Stark Klein, MD;  Location: [place];  Service: General;  Laterality: Left;    FAMILY HISTORY: Family History  Problem Relation Age of Onset  . Diabetes Father   . Hypertension Father   . Early death Mother   . Heart disease Mother   . Hypertension Mother   . Hearing loss Maternal Grandmother   . Hypertension Maternal Grandmother   . Uterine cancer Other 67       MGM's sister  . Throat cancer Other        MGM's brother; dx after 64  . Lung cancer Other        PGM's sister; dx 31s  . Diabetes Brother    Her father is living at age 15 as of March 17, 2021. Her mother died at age 27 from [place]. Ayda has two brothers (and no sisters). She reports endometrial cancer in a maternal great aunt and lung cancer in a paternal aunt. There is no other family history of cancer to her knowledge.   GYNECOLOGIC HISTORY:  No LMP recorded. Menarche: 45 years old Age at first live birth: 45 years old [place] P 2 LMP early 03-17-21 (has since had IUD placed) Contraceptive: IUD in place [place]), previously used pills from 1996-2006 HRT n/a  Hysterectomy? no BSO? 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 She is single. She lives at home with daughter Diane Miranda (age 6), adopted daughter Diane Miranda (age 1), and the patient's maternal grandmother, who has significant dementia issues. Daughter Diane Miranda, age 110, works in Press photographer in [place]. Hessie is a Restaurant manager, fast food.    ADVANCED DIRECTIVES: not in place; however the patient made it clear during her 03/07/2021 visit that she would not accept blood product transfusions for any reason including immediately life saving reasons   HEALTH MAINTENANCE: Social History   Tobacco Use  . Smoking status: Never Smoker  . Smokeless tobacco: Never Used  Vaping Use  . Vaping Use: Never used  Substance Use Topics  . Alcohol use: Not Currently    Alcohol/week: 0.0 standard drinks    Comment: social  . Drug use: No     Colonoscopy: n/a (age)  PAP: 2020  Bone density: n/a (age)   Allergies  Allergen Reactions  . Hydrochlorothiazide Anaphylaxis and Swelling    Current Outpatient Medications  Medication Sig Dispense Refill  . acetaminophen (TYLENOL) 500 MG tablet Take 1 tablet (500 mg total) by mouth 3 (three) times daily with meals as needed. Take with aleve 220 mg tablet 90 tablet 0  . carvedilol (COREG) 3.125 MG tablet TAKE 1  TABLET (3.125 MG TOTAL) BY MOUTH 2 (TWO) TIMES DAILY WITH A MEAL. 60 tablet 0  . dexamethasone (DECADRON) 4 MG tablet Take 2 tablets (8 mg total) by mouth 2 (two) times daily. Start the day before Taxotere. Then take daily x 3 days after chemotherapy. 30 tablet 1  . Fexofenadine HCl (ALLEGRA ALLERGY PO) Take by mouth.    . lidocaine-prilocaine (EMLA) cream Apply to affected area once 30 g 3  . lisinopril (ZESTRIL) 20 MG tablet TAKE 1 TABLET BY MOUTH EVERY DAY IN THE MORNING 30 tablet 0  . loratadine (CLARITIN) 10 MG tablet Take 1 tablet (10 mg total) by mouth daily. (Patient taking differently: Take 10 mg by mouth daily as needed.) 90 tablet 4  . methylPREDNISolone  (MEDROL DOSEPAK) 4 MG TBPK tablet Take as directed 21 tablet 1  . Multiple Vitamins-Minerals (MULTIVITAMIN WITH MINERALS) tablet Take 1 tablet by mouth daily.    . ondansetron (ZOFRAN) 8 MG tablet Take 1 tablet (8 mg total) by mouth 2 (two) times daily as needed (Nausea or vomiting). Start on the third day after chemotherapy. 30 tablet 1  . oxyCODONE (OXY IR/ROXICODONE) 5 MG immediate release tablet Take 1 tablet (5 mg total) by mouth every 6 (six) hours as needed for severe pain. 5 tablet 0  . prochlorperazine (COMPAZINE) 10 MG tablet Take 1 tablet (10 mg total) by mouth every 6 (six) hours as needed (Nausea or vomiting). 30 tablet 1  . rosuvastatin (CRESTOR) 10 MG tablet Take 10 mg by mouth daily.    . Turmeric (QC TUMERIC COMPLEX PO) Take by mouth.    . valACYclovir (VALTREX) 1000 MG tablet Take 1 tablet (1,000 mg total) by mouth daily. 60 tablet 1   No current facility-administered medications for this visit.   Facility-Administered Medications Ordered in Other Visits  Medication Dose Route Frequency Provider Last Rate Last Admin  . sodium chloride flush (NS) 0.9 % injection 10 mL  10 mL Intravenous PRN Magrinat, Virgie Dad, MD   10 mL at 05/08/21 0940    OBJECTIVE: African-American woman in no acute distress  Vitals:   05/08/21 1010  BP: (!) 151/84  Pulse: 95  Resp: 18  Temp: (!) 97.5 F (36.4 C)  SpO2: 100%     Body mass index is 40.72 kg/m.   Wt Readings from Last 3 Encounters:  05/08/21 237 lb 3.2 oz (107.6 kg)  05/02/21 234 lb 6.4 oz (106.3 kg)  04/17/21 236 lb 12.8 oz (107.4 kg)      ECOG FS:1 - Symptomatic but completely ambulatory  Sclerae unicteric, EOMs intact Wearing a mask No cervical or supraclavicular adenopathy Lungs no rales or rhonchi Heart regular rate and rhythm Abd soft, nontender, positive bowel sounds MSK no focal spinal tenderness, no upper extremity lymphedema Neuro: nonfocal, well oriented, appropriate affect Breasts: I do not feel a mass in the  right breast.  The left breast and both axillae are benign.   LAB RESULTS:  CMP     Component Value Date/Time   NA 142 05/02/2021 1404   K 3.5 05/02/2021 1404   CL 107 05/02/2021 1404   CO2 28 05/02/2021 1404   GLUCOSE 122 (H) 05/02/2021 1404   GLUCOSE 105 (H) 11/12/2006 1022   BUN 6 05/02/2021 1404   CREATININE 0.86 05/02/2021 1404   CREATININE 0.92 03/07/2021 1219   CALCIUM 8.8 (L) 05/02/2021 1404   PROT 5.9 (L) 05/02/2021 1404   ALBUMIN 3.3 (L) 05/02/2021 1404   AST 13 (L) 05/02/2021  1404   AST 13 (L) 03/07/2021 1219   ALT 13 05/02/2021 1404   ALT 16 03/07/2021 1219   ALKPHOS 71 05/02/2021 1404   BILITOT 0.4 05/02/2021 1404   BILITOT 0.6 03/07/2021 1219   GFRNONAA >60 05/02/2021 1404   GFRNONAA >60 03/07/2021 1219   GFRAA >90 01/30/2015 1319    No results found for: TOTALPROTELP, ALBUMINELP, A1GS, A2GS, BETS, BETA2SER, GAMS, MSPIKE, SPEI  Lab Results  Component Value Date   WBC 12.4 (H) 05/08/2021   NEUTROABS 11.2 (H) 05/08/2021   HGB 10.8 (L) 05/08/2021   HCT 32.2 (L) 05/08/2021   MCV 93.1 05/08/2021   PLT 324 05/08/2021    No results found for: LABCA2  No components found for: [id number]  No results for input(s): INR in the last 168 hours.  No results found for: LABCA2  No results found for: DUK025  No results found for: KYH062  No results found for: BJS283  No results found for: CA2729  No components found for: HGQUANT  No results found for: CEA1 / No results found for: CEA1   No results found for: AFPTUMOR  No results found for: CHROMOGRNA  No results found for: KPAFRELGTCHN, LAMBDASER, KAPLAMBRATIO (kappa/lambda light chains)  No results found for: HGBA, HGBA2QUANT, HGBFQUANT, HGBSQUAN (Hemoglobinopathy evaluation)   No results found for: LDH  No results found for: IRON, TIBC, IRONPCTSAT (Iron and TIBC)  No results found for: FERRITIN  Urinalysis    Component Value Date/Time   COLORURINE YELLOW 01/30/2015 [place] 01/30/2015 1343   LABSPEC 1.011 01/30/2015 1343   PHURINE 7.5 01/30/2015 1343   GLUCOSEU NEGATIVE 01/30/2015 1343   HGBUR NEGATIVE 01/30/2015 1343   BILIRUBINUR NEGATIVE 01/30/2015 1343   KETONESUR NEGATIVE 01/30/2015 1343   PROTEINUR NEGATIVE 01/30/2015 1343   UROBILINOGEN 0.2 01/30/2015 1343   NITRITE NEGATIVE 01/30/2015 1343   LEUKOCYTESUR SMALL (A) 01/30/2015 1343    STUDIES: MR 3D Recon At Scanner  Result Date: 04/17/2021 CLINICAL DATA:  Evaluate mass within head of pancreas. History of right breast cancer. EXAM: MRI ABDOMEN WITHOUT AND WITH CONTRAST (INCLUDING MRCP) TECHNIQUE: Multiplanar multisequence MR imaging of the abdomen was performed both before and after the administration of intravenous contrast. Heavily T2-weighted images of the biliary and pancreatic ducts were obtained, and three-dimensional MRCP images were rendered by post processing. CONTRAST:  34m GADAVIST GADOBUTROL 1 MMOL/ML IV SOLN COMPARISON:  CT chest, abdomen and pelvis 03/20/2021. FINDINGS: Lower chest: No acute findings. Hepatobiliary: No suspicious liver abnormality. Small cysts noted within posterior right lobe of liver measuring 4 mm, image 9/18. Gallbladder appears normal. No biliary ductal dilatation. Pancreas: No main duct dilatation or inflammation. The cystic lesion within the head of pancreas is again noted. This has a maximum dimension of 2.3 cm, image 15/10. No signs of internal enhancement or mural nodularity. No additional pancreas lesions identified. Spleen:  Within normal limits in size and appearance. Adrenals/Urinary Tract: No masses identified. No evidence of hydronephrosis. Small cyst noted within posterior cortex of the right mid kidney measuring 5 mm, image 55/32. No suspicious mass or hydronephrosis. Stomach/Bowel: Stomach appears normal. No abnormal bowel distention or wall thickening identified. Vascular/Lymphatic: No pathologically enlarged lymph nodes identified. No abdominal aortic  aneurysm demonstrated. Other:  No free fluid or fluid collections. Musculoskeletal: No suspicious bone lesions identified. The enhancing left breast lesion identified previously is partially visualized measuring approximately 2.5 cm, image 15/28. Previously this was measured at 4.4 cm. IMPRESSION: 1. Cystic lesion within the head  of pancreas is again noted. No suspicious features identified. This is favored to represent a pseudocyst. Follow-up imaging in 6 months with repeat pancreas protocol MRI without and with contrast material is recommended. This recommendation follows ACR consensus guidelines: Management of Incidental Pancreatic Cysts: A White Paper of the ACR Incidental Findings Committee. J Am Coll Radiol 4818;56:314-970. 2. No evidence for metastatic disease within the abdomen. 3. Partially visualized enhancing left breast lesion identified measuring approximately 2.5 cm. Previously this was measured at 4.4 cm. Electronically Signed   By: Kerby Moors M.D.   On: 04/17/2021 12:05   MR ABDOMEN MRCP W WO CONTAST  Result Date: 04/17/2021 CLINICAL DATA:  Evaluate mass within head of pancreas. History of right breast cancer. EXAM: MRI ABDOMEN WITHOUT AND WITH CONTRAST (INCLUDING MRCP) TECHNIQUE: Multiplanar multisequence MR imaging of the abdomen was performed both before and after the administration of intravenous contrast. Heavily T2-weighted images of the biliary and pancreatic ducts were obtained, and three-dimensional MRCP images were rendered by post processing. CONTRAST:  76m GADAVIST GADOBUTROL 1 MMOL/ML IV SOLN COMPARISON:  CT chest, abdomen and pelvis 03/20/2021. FINDINGS: Lower chest: No acute findings. Hepatobiliary: No suspicious liver abnormality. Small cysts noted within posterior right lobe of liver measuring 4 mm, image 9/18. Gallbladder appears normal. No biliary ductal dilatation. Pancreas: No main duct dilatation or inflammation. The cystic lesion within the head of pancreas is again  noted. This has a maximum dimension of 2.3 cm, image 15/10. No signs of internal enhancement or mural nodularity. No additional pancreas lesions identified. Spleen:  Within normal limits in size and appearance. Adrenals/Urinary Tract: No masses identified. No evidence of hydronephrosis. Small cyst noted within posterior cortex of the right mid kidney measuring 5 mm, image 55/32. No suspicious mass or hydronephrosis. Stomach/Bowel: Stomach appears normal. No abnormal bowel distention or wall thickening identified. Vascular/Lymphatic: No pathologically enlarged lymph nodes identified. No abdominal aortic aneurysm demonstrated. Other:  No free fluid or fluid collections. Musculoskeletal: No suspicious bone lesions identified. The enhancing left breast lesion identified previously is partially visualized measuring approximately 2.5 cm, image 15/28. Previously this was measured at 4.4 cm. IMPRESSION: 1. Cystic lesion within the head of pancreas is again noted. No suspicious features identified. This is favored to represent a pseudocyst. Follow-up imaging in 6 months with repeat pancreas protocol MRI without and with contrast material is recommended. This recommendation follows ACR consensus guidelines: Management of Incidental Pancreatic Cysts: A White Paper of the ACR Incidental Findings Committee. J Am Coll Radiol 22637;85:885-027 2. No evidence for metastatic disease within the abdomen. 3. Partially visualized enhancing left breast lesion identified measuring approximately 2.5 cm. Previously this was measured at 4.4 cm. Electronically Signed   By: TKerby MoorsM.D.   On: 04/17/2021 12:05     ELIGIBLE FOR AVAILABLE RESEARCH PROTOCOL:no  ASSESSMENT: 45y.o. [place] woman status post right breast upper outer quadrant biopsy 02/23/2021 for a clinical T3 N1, stage IIB invasive ductal carcinoma, grade 3, triple positive, with an MIB-1 of 35%  (a) right periareolar biopsy 02/23/2021 was benign/concordant  (b)  left breast nodule biopsy 03/14/2021 benign  (c) Punch biopsy of the right areolar area 03/22/2021 no malignancy  (d) CT of the chest abdomen and pelvis 03/20/2021 shows no evidence of metastatic disease; there is 2.5 cm cystic pancreatic mass and a 0.4 cm right liver lobe lesion requiring further follow-up  (e) bone scan 03/25/2021 shows no bone metastases  (1) genetics testing 03/26/2021 through the  [employer]Cancer+RNAinsight panel offered by [facility]  [facility] found no deleterious mutations in APC, ATM, AXIN2, BARD1, BMPR1A, BRCA1, BRCA2, BRIP1, CDH1, CDK4, CDKN2A, CHEK2, DICER1, EPCAM, GREM1, HOXB13, MEN1, MLH1, MSH2, MSH3, MSH6, MUTYH, NBN, NF1, NF2, NTHL1, PALB2, PMS2, POLD1, POLE, PTEN, RAD51C, RAD51D, RECQL, RET, SDHA, SDHAF2, SDHB, SDHC, SDHD, SMAD4, SMARCA4, STK11, TP53, TSC1, TSC2, and VHL. RNA data is routinely analyzed for use in variant interpretation for all genes.   (2) neoadjuvant chemotherapy consisting of carboplatin, docetaxel, trastuzumab and pertuzumab every 21 days x 6 beginning 03/28/2021  (3) anti-HER-2 immunotherapy to continue to total 1 year  (a) echo 03/13/2021 showed an ejection fraction in the 60- 65% range  (4) definitive surgery to follow  (5) adjuvant radiation  (6) antiestrogens   PLAN: Casilda ' counts are quite good and we are proceeding with the third of 6 cycles today.  She has developed epiphora.  I am adding TobraDex and asking her to take and drop in each eye twice daily.  If that does not work we will try Restasis  It is very favorable that she has had no peripheral neuropathy symptoms so far.  It is also favorable that we no longer palpate a mass in the right breast.  She wondered if we should do some imaging and then may be skip further chemo treatments but she understands the recipe calls for 6 cycles and if she wants to get the results from those studies she does need to go through the full 6 cycles.  She was able to understand that without any  difficulty.  She will see me again in 3 weeks.  She knows to call for any other issue that may develop before then  Total encounter time 20 minutes.  Virgie Dad. Johnel Yielding, MD 05/08/2021 10:19 AM Medical Oncology and Hematology [facility] [place], [place] [address] Tel. [phone]    Fax. [phone]   This document serves as a record of services personally performed by Lurline Del, MD. It was created on his behalf by Wilburn Mylar, a trained medical scribe. The creation of this record is based on the scribe's personal observations and the provider's statements to them.   I, Lurline Del MD, have reviewed the above documentation for accuracy and completeness, and I agree with the above.   *Total Encounter Time as defined by the Centers for Medicare and Medicaid Services includes, in addition to the face-to-face time of a patient visit (documented in the note above) non-face-to-face time: obtaining and reviewing outside history, ordering and reviewing medications, tests or procedures, care coordination (communications with other health care professionals or caregivers) and documentation in the medical record.

## 2021-05-08 ENCOUNTER — Encounter: Payer: Self-pay | Admitting: Licensed Clinical Social Worker

## 2021-05-08 ENCOUNTER — Other Ambulatory Visit: Payer: No Typology Code available for payment source

## 2021-05-08 ENCOUNTER — Inpatient Hospital Stay: Payer: No Typology Code available for payment source

## 2021-05-08 ENCOUNTER — Other Ambulatory Visit: Payer: Self-pay

## 2021-05-08 ENCOUNTER — Inpatient Hospital Stay (HOSPITAL_BASED_OUTPATIENT_CLINIC_OR_DEPARTMENT_OTHER): Payer: No Typology Code available for payment source | Admitting: Oncology

## 2021-05-08 VITALS — BP 151/84 | HR 95 | Temp 97.5°F | Resp 18 | Ht 64.0 in | Wt 237.2 lb

## 2021-05-08 DIAGNOSIS — Z170 Estrogen receptor positive status [ER+]: Secondary | ICD-10-CM | POA: Diagnosis not present

## 2021-05-08 DIAGNOSIS — C50411 Malignant neoplasm of upper-outer quadrant of right female breast: Secondary | ICD-10-CM

## 2021-05-08 DIAGNOSIS — Z5111 Encounter for antineoplastic chemotherapy: Secondary | ICD-10-CM | POA: Diagnosis not present

## 2021-05-08 LAB — CBC WITH DIFFERENTIAL/PLATELET
Abs Immature Granulocytes: 0.03 10*3/uL (ref 0.00–0.07)
Basophils Absolute: 0 10*3/uL (ref 0.0–0.1)
Basophils Relative: 0 %
Eosinophils Absolute: 0 10*3/uL (ref 0.0–0.5)
Eosinophils Relative: 0 %
HCT: 32.2 % — ABNORMAL LOW (ref 36.0–46.0)
Hemoglobin: 10.8 g/dL — ABNORMAL LOW (ref 12.0–15.0)
Immature Granulocytes: 0 %
Lymphocytes Relative: 7 %
Lymphs Abs: 0.8 10*3/uL (ref 0.7–4.0)
MCH: 31.2 pg (ref 26.0–34.0)
MCHC: 33.5 g/dL (ref 30.0–36.0)
MCV: 93.1 fL (ref 80.0–100.0)
Monocytes Absolute: 0.3 10*3/uL (ref 0.1–1.0)
Monocytes Relative: 3 %
Neutro Abs: 11.2 10*3/uL — ABNORMAL HIGH (ref 1.7–7.7)
Neutrophils Relative %: 90 %
Platelets: 324 10*3/uL (ref 150–400)
RBC: 3.46 MIL/uL — ABNORMAL LOW (ref 3.87–5.11)
RDW: 18.9 % — ABNORMAL HIGH (ref 11.5–15.5)
WBC: 12.4 10*3/uL — ABNORMAL HIGH (ref 4.0–10.5)
nRBC: 0 % (ref 0.0–0.2)

## 2021-05-08 LAB — COMPREHENSIVE METABOLIC PANEL
ALT: 11 U/L (ref 0–44)
AST: 8 U/L — ABNORMAL LOW (ref 15–41)
Albumin: 3.4 g/dL — ABNORMAL LOW (ref 3.5–5.0)
Alkaline Phosphatase: 67 U/L (ref 38–126)
Anion gap: 9 (ref 5–15)
BUN: 11 mg/dL (ref 6–20)
CO2: 23 mmol/L (ref 22–32)
Calcium: 8.8 mg/dL — ABNORMAL LOW (ref 8.9–10.3)
Chloride: 107 mmol/L (ref 98–111)
Creatinine, Ser: 0.81 mg/dL (ref 0.44–1.00)
GFR, Estimated: >60 mL/min (ref >60)
Glucose, Bld: 190 mg/dL — ABNORMAL HIGH (ref 70–99)
Potassium: 3.9 mmol/L (ref 3.5–5.1)
Sodium: 139 mmol/L (ref 135–145)
Total Bilirubin: 0.4 mg/dL (ref 0.3–1.2)
Total Protein: 6.4 g/dL — ABNORMAL LOW (ref 6.5–8.1)

## 2021-05-08 MED ORDER — HEPARIN SOD (PORK) LOCK FLUSH 100 UNIT/ML IV SOLN
500.0000 [IU] | Freq: Once | INTRAVENOUS | Status: AC | PRN
  Administered 2021-05-08: 500 [IU]
  Filled 2021-05-08: qty 5

## 2021-05-08 MED ORDER — DIPHENHYDRAMINE HCL 25 MG PO CAPS
ORAL_CAPSULE | ORAL | Status: AC
  Filled 2021-05-08: qty 1

## 2021-05-08 MED ORDER — PALONOSETRON HCL INJECTION 0.25 MG/5ML
0.2500 mg | Freq: Once | INTRAVENOUS | Status: AC
  Administered 2021-05-08: 0.25 mg via INTRAVENOUS

## 2021-05-08 MED ORDER — SODIUM CHLORIDE 0.9 % IV SOLN
420.0000 mg | Freq: Once | INTRAVENOUS | Status: AC
  Administered 2021-05-08: 420 mg via INTRAVENOUS
  Filled 2021-05-08: qty 14

## 2021-05-08 MED ORDER — ACETAMINOPHEN 325 MG PO TABS
650.0000 mg | ORAL_TABLET | Freq: Once | ORAL | Status: AC
Start: 2021-05-08 — End: 2021-05-08
  Administered 2021-05-08: 650 mg via ORAL

## 2021-05-08 MED ORDER — SODIUM CHLORIDE 0.9 % IV SOLN
75.0000 mg/m2 | Freq: Once | INTRAVENOUS | Status: AC
  Administered 2021-05-08: 170 mg via INTRAVENOUS
  Filled 2021-05-08: qty 17

## 2021-05-08 MED ORDER — TOBRAMYCIN-DEXAMETHASONE 0.3-0.1 % OP SUSP: 1.0000 [drp] | Freq: Two times a day (BID) | OPHTHALMIC | 2 refills | Status: DC

## 2021-05-08 MED ORDER — PALONOSETRON HCL INJECTION 0.25 MG/5ML
INTRAVENOUS | Status: AC
  Filled 2021-05-08: qty 5

## 2021-05-08 MED ORDER — ACETAMINOPHEN 325 MG PO TABS
ORAL_TABLET | ORAL | Status: AC
  Filled 2021-05-08: qty 2

## 2021-05-08 MED ORDER — SODIUM CHLORIDE 0.9 % IV SOLN
Freq: Once | INTRAVENOUS | Status: AC
  Filled 2021-05-08: qty 250

## 2021-05-08 MED ORDER — SODIUM CHLORIDE 0.9% FLUSH
10.0000 mL | INTRAVENOUS | Status: DC | PRN
  Administered 2021-05-08: 10 mL
  Filled 2021-05-08: qty 10

## 2021-05-08 MED ORDER — DIPHENHYDRAMINE HCL 25 MG PO CAPS
25.0000 mg | ORAL_CAPSULE | Freq: Once | ORAL | Status: AC
  Administered 2021-05-08: 25 mg via ORAL

## 2021-05-08 MED ORDER — SODIUM CHLORIDE 0.9 % IV SOLN
150.0000 mg | Freq: Once | INTRAVENOUS | Status: AC
  Administered 2021-05-08: 150 mg via INTRAVENOUS
  Filled 2021-05-08: qty 150

## 2021-05-08 MED ORDER — SODIUM CHLORIDE 0.9 % IV SOLN
700.0000 mg | Freq: Once | INTRAVENOUS | Status: AC
  Administered 2021-05-08: 700 mg via INTRAVENOUS
  Filled 2021-05-08: qty 70

## 2021-05-08 MED ORDER — SODIUM CHLORIDE 0.9% FLUSH
10.0000 mL | INTRAVENOUS | Status: DC | PRN
  Administered 2021-05-08: 10 mL via INTRAVENOUS
  Filled 2021-05-08: qty 10

## 2021-05-08 MED ORDER — SODIUM CHLORIDE 0.9 % IV SOLN
10.0000 mg | Freq: Once | INTRAVENOUS | Status: AC
  Administered 2021-05-08: 10 mg via INTRAVENOUS
  Filled 2021-05-08: qty 10

## 2021-05-08 MED ORDER — TRASTUZUMAB-ANNS CHEMO 150 MG IV SOLR
6.0000 mg/kg | Freq: Once | INTRAVENOUS | Status: AC
  Administered 2021-05-08: 651 mg via INTRAVENOUS
  Filled 2021-05-08: qty 31

## 2021-05-08 NOTE — Patient Instructions (Signed)

## 2021-05-08 NOTE — Progress Notes (Signed)
[facility] CSW Progress Note  Holiday representative met with patient in exam room. Provided 3rd set of [employer]. Patient has completed two applications but does not have them today. She will either scan & send them to this CSW or bring them to her next appointment. Patient noted that she had a few tough days last week where she was feeling more depressed. She let herself feel it, let it out, and process and is feeling better this week. CSW encouraged patient to call to speak with this CSW should she need additional support.    Christeen Douglas , LCSW

## 2021-05-08 NOTE — Patient Instructions (Signed)
[facility]  Discharge Instructions: Thank you for choosing [facility] to provide your oncology and hematology care.   If you have a lab appointment with the [facility], please go directly to the [facility] and check in at the registration area.   Wear comfortable clothing and clothing appropriate for easy access to any Portacath or PICC line.   We strive to give you quality time with your provider. You may need to reschedule your appointment if you arrive late (15 or more minutes).  Arriving late affects you and other patients whose appointments are after yours.  Also, if you miss three or more appointments without notifying the office, you may be dismissed from the clinic at the provider's discretion.      For prescription refill requests, have your pharmacy contact our office and allow 72 hours for refills to be completed.    Today you received the following chemotherapy and/or immunotherapy agents trastuzumab, pertuzumab, docetaxel, carboplatin     To help prevent nausea and vomiting after your treatment, we encourage you to take your nausea medication as directed.  BELOW ARE SYMPTOMS THAT SHOULD BE REPORTED IMMEDIATELY: . *FEVER GREATER THAN 100.4 F (38 C) OR HIGHER . *CHILLS OR SWEATING . *NAUSEA AND VOMITING THAT IS NOT CONTROLLED WITH YOUR NAUSEA MEDICATION . *UNUSUAL SHORTNESS OF BREATH . *UNUSUAL BRUISING OR BLEEDING . *URINARY PROBLEMS (pain or burning when urinating, or frequent urination) . *BOWEL PROBLEMS (unusual diarrhea, constipation, pain near the anus) . TENDERNESS IN MOUTH AND THROAT WITH OR WITHOUT PRESENCE OF ULCERS (sore throat, sores in mouth, or a toothache) . UNUSUAL RASH, SWELLING OR PAIN  . UNUSUAL VAGINAL DISCHARGE OR ITCHING   Items with * indicate a potential emergency and should be followed up as soon as possible or go to the Emergency Department if any problems should occur.  Please show the  CHEMOTHERAPY ALERT CARD or IMMUNOTHERAPY ALERT CARD at check-in to the Emergency Department and triage nurse.  Should you have questions after your visit or need to cancel or reschedule your appointment, please contact [facility]  Dept: [phone]  and follow the prompts.  Office hours are 8:00 a.m. to 4:30 p.m. Monday - Friday. Please note that voicemails left after 4:00 p.m. may not be returned until the following business day.  We are closed weekends and major holidays. You have access to a nurse at all times for urgent questions. Please call the main number to the clinic Dept: [phone] and follow the prompts.   For any non-urgent questions, you may also contact your provider using MyChart. We now offer e-Visits for anyone 32 and older to request care online for non-urgent symptoms. For details visit [email or web address].   Also download the MyChart app! Go to the app store, search "MyChart", open the app, select [facility], and log in with your MyChart username and password.  Due to Covid, a mask is required upon entering the hospital/clinic. If you do not have a mask, one will be given to you upon arrival. For doctor visits, patients may have 1 support person aged 24 or older with them. For treatment visits, patients cannot have anyone with them due to current Covid guidelines and our immunocompromised population.

## 2021-05-09 ENCOUNTER — Telehealth: Payer: Self-pay | Admitting: Oncology

## 2021-05-09 NOTE — Telephone Encounter (Signed)
Scheduled per 5/24 los. Pt will receive an updated appt calender per next visit appt notes

## 2021-05-10 ENCOUNTER — Other Ambulatory Visit: Payer: Self-pay

## 2021-05-10 ENCOUNTER — Inpatient Hospital Stay: Payer: No Typology Code available for payment source

## 2021-05-10 ENCOUNTER — Encounter: Payer: Self-pay | Admitting: Oncology

## 2021-05-10 VITALS — BP 143/89 | HR 75 | Temp 98.4°F | Resp 18

## 2021-05-10 DIAGNOSIS — C50411 Malignant neoplasm of upper-outer quadrant of right female breast: Secondary | ICD-10-CM

## 2021-05-10 DIAGNOSIS — Z5111 Encounter for antineoplastic chemotherapy: Secondary | ICD-10-CM | POA: Diagnosis not present

## 2021-05-10 DIAGNOSIS — Z170 Estrogen receptor positive status [ER+]: Secondary | ICD-10-CM

## 2021-05-10 MED ORDER — PEGFILGRASTIM-BMEZ 6 MG/0.6ML ~~LOC~~ SOSY
6.0000 mg | PREFILLED_SYRINGE | Freq: Once | SUBCUTANEOUS | Status: AC
  Administered 2021-05-10: 6 mg via SUBCUTANEOUS

## 2021-05-10 MED ORDER — PEGFILGRASTIM-BMEZ 6 MG/0.6ML ~~LOC~~ SOSY
PREFILLED_SYRINGE | SUBCUTANEOUS | Status: AC
  Filled 2021-05-10: qty 0.6

## 2021-05-10 NOTE — Patient Instructions (Signed)
Pegfilgrastim injection What is this medicine? PEGFILGRASTIM (PEG fil gra stim) is a long-acting granulocyte colony-stimulating factor that stimulates the growth of neutrophils, a type of white blood cell important in the body's fight against infection. It is used to reduce the incidence of fever and infection in patients with certain types of cancer who are receiving chemotherapy that affects the bone marrow, and to increase survival after being exposed to high doses of radiation. This medicine may be used for other purposes; ask your health care provider or pharmacist if you have questions. COMMON BRAND NAME(S): Fulphila, Neulasta, Nyvepria, UDENYCA, Ziextenzo What should I tell my health care provider before I take this medicine? They need to know if you have any of these conditions:  kidney disease  latex allergy  ongoing radiation therapy  sickle cell disease  skin reactions to acrylic adhesives (On-Body Injector only)  an unusual or allergic reaction to pegfilgrastim, filgrastim, other medicines, foods, dyes, or preservatives  pregnant or trying to get pregnant  breast-feeding How should I use this medicine? This medicine is for injection under the skin. If you get this medicine at home, you will be taught how to prepare and give the pre-filled syringe or how to use the On-body Injector. Refer to the patient Instructions for Use for detailed instructions. Use exactly as directed. Tell your healthcare provider immediately if you suspect that the On-body Injector may not have performed as intended or if you suspect the use of the On-body Injector resulted in a missed or partial dose. It is important that you put your used needles and syringes in a special sharps container. Do not put them in a trash can. If you do not have a sharps container, call your pharmacist or healthcare provider to get one. Talk to your pediatrician regarding the use of this medicine in children. While this drug  may be prescribed for selected conditions, precautions do apply. Overdosage: If you think you have taken too much of this medicine contact a poison control center or emergency room at once. NOTE: This medicine is only for you. Do not share this medicine with others. What if I miss a dose? It is important not to miss your dose. Call your doctor or health care professional if you miss your dose. If you miss a dose due to an On-body Injector failure or leakage, a new dose should be administered as soon as possible using a single prefilled syringe for manual use. What may interact with this medicine? Interactions have not been studied. This list may not describe all possible interactions. Give your health care provider a list of all the medicines, herbs, non-prescription drugs, or dietary supplements you use. Also tell them if you smoke, drink alcohol, or use illegal drugs. Some items may interact with your medicine. What should I watch for while using this medicine? Your condition will be monitored carefully while you are receiving this medicine. You may need blood work done while you are taking this medicine. Talk to your health care provider about your risk of cancer. You may be more at risk for certain types of cancer if you take this medicine. If you are going to need a MRI, CT scan, or other procedure, tell your doctor that you are using this medicine (On-Body Injector only). What side effects may I notice from receiving this medicine? Side effects that you should report to your doctor or health care professional as soon as possible:  allergic reactions (skin rash, itching or hives, swelling of   the face, lips, or tongue)  back pain  dizziness  fever  pain, redness, or irritation at site where injected  pinpoint red spots on the skin  red or dark-brown urine  shortness of breath or breathing problems  stomach or side pain, or pain at the shoulder  swelling  tiredness  trouble  passing urine or change in the amount of urine  unusual bruising or bleeding Side effects that usually do not require medical attention (report to your doctor or health care professional if they continue or are bothersome):  bone pain  muscle pain This list may not describe all possible side effects. Call your doctor for medical advice about side effects. You may report side effects to FDA at 1-800-FDA-1088. Where should I keep my medicine? Keep out of the reach of children. If you are using this medicine at home, you will be instructed on how to store it. Throw away any unused medicine after the expiration date on the label. NOTE: This sheet is a summary. It may not cover all possible information. If you have questions about this medicine, talk to your doctor, pharmacist, or health care provider.  2021 Elsevier/Gold Standard (2019-12-24 13:20:51)  

## 2021-05-10 NOTE — Telephone Encounter (Signed)
No entry 

## 2021-05-17 ENCOUNTER — Ambulatory Visit: Payer: No Typology Code available for payment source | Admitting: Cardiology

## 2021-05-24 ENCOUNTER — Ambulatory Visit: Payer: No Typology Code available for payment source | Admitting: Cardiology

## 2021-05-24 ENCOUNTER — Other Ambulatory Visit: Payer: Self-pay

## 2021-05-24 ENCOUNTER — Encounter: Payer: Self-pay | Admitting: Cardiology

## 2021-05-24 VITALS — BP 151/79 | HR 107 | Temp 98.5°F | Resp 16 | Ht 64.0 in | Wt 230.0 lb

## 2021-05-24 DIAGNOSIS — R000 Tachycardia, unspecified: Secondary | ICD-10-CM

## 2021-05-24 DIAGNOSIS — E782 Mixed hyperlipidemia: Secondary | ICD-10-CM

## 2021-05-24 DIAGNOSIS — R002 Palpitations: Secondary | ICD-10-CM

## 2021-05-24 DIAGNOSIS — I10 Essential (primary) hypertension: Secondary | ICD-10-CM

## 2021-05-24 DIAGNOSIS — Z6841 Body Mass Index (BMI) 40.0 and over, adult: Secondary | ICD-10-CM

## 2021-05-24 DIAGNOSIS — Z8616 Personal history of COVID-19: Secondary | ICD-10-CM

## 2021-05-24 MED ORDER — CARVEDILOL 6.25 MG PO TABS: 6.2500 mg | ORAL_TABLET | Freq: Two times a day (BID) | ORAL | 3 refills | Status: DC

## 2021-05-24 MED ORDER — LISINOPRIL 20 MG PO TABS: 20.0000 mg | ORAL_TABLET | Freq: Every morning | ORAL | 1 refills | Status: DC

## 2021-05-24 NOTE — Progress Notes (Signed)
Date:  05/24/2021   ID:  Diane Miranda, DOB 23-Feb-1976, MRN [id number]  PCP:  [name], MD  Cardiologist:  Rex Kras, DO, [facility] (established care 04/12/2021)  Date: 05/24/21 Last Office Visit: 04/12/2021   Chief Complaint  Patient presents with   Tachycardia   Follow-up    4 weeks     HPI  Diane KENIYA SCHLOTTERBECK is a 45 y.o. female who presents to the office with a chief complaint of "reevaluation of palpitations." Patient's past medical history and cardiovascular risk factors include: Hx of COVID 2021, right sided breast cancer (dx March 2022) currently on chemotherapy, hyperlipidemia, hypertension, GERD, obesity due to excess calorie.   She is referred to the office at the request of Pavelock, Ralene Bathe, MD for evaluation of tachycardia.  Patient was experiencing palpitations/tachycardia since her COVID-19 infection back in 2021 as her base heart rate range between 110-112 bpm.  She has not had any near-syncope or syncopal events.  And no reversible causes identified.  She underwent a extended Holter monitor which notes normal sinus rhythm without any significant dysrhythmias.  Her average heart rate was greater than 100 bpm.  Since the Holter monitor she has started carvedilol 3.125 which she is tolerating well and has noticed that the heart rate is better controlled.  Given her recent diagnosis of breast cancer as of March 2022 I had transitioned her medications from amlodipine to lisinopril and added carvedilol given the cardioprotective properties.  Follow-up blood work independently reviewed kidney function and electrolytes remained stable.  Home blood pressures are now averaging 135/84 mmHg.  No family history of premature coronary disease or sudden cardiac death.  FUNCTIONAL STATUS: Walks 30 to 40 minutes 3-4 times a week.  ALLERGIES: Allergies  Allergen Reactions   Hydrochlorothiazide Anaphylaxis and Swelling    MEDICATION LIST PRIOR TO VISIT: Current  Meds  Medication Sig   acetaminophen (TYLENOL) 500 MG tablet Take 1 tablet (500 mg total) by mouth 3 (three) times daily with meals as needed. Take with aleve 220 mg tablet   carvedilol (COREG) 6.25 MG tablet Take 1 tablet (6.25 mg total) by mouth 2 (two) times daily.   dexamethasone (DECADRON) 4 MG tablet Take 2 tablets (8 mg total) by mouth 2 (two) times daily. Start the day before Taxotere. Then take daily x 3 days after chemotherapy.   Fexofenadine HCl (ALLEGRA ALLERGY PO) Take by mouth.   lidocaine-prilocaine (EMLA) cream Apply to affected area once   loratadine (CLARITIN) 10 MG tablet Take 1 tablet (10 mg total) by mouth daily. (Patient taking differently: Take 10 mg by mouth daily as needed.)   Multiple Vitamins-Minerals (MULTIVITAMIN WITH MINERALS) tablet Take 1 tablet by mouth daily.   naproxen sodium (ALEVE) 220 MG tablet Take 220 mg by mouth.   ondansetron (ZOFRAN) 8 MG tablet Take 1 tablet (8 mg total) by mouth 2 (two) times daily as needed (Nausea or vomiting). Start on the third day after chemotherapy.   pantoprazole (PROTONIX) 20 MG tablet Take 20 mg by mouth daily.   prochlorperazine (COMPAZINE) 10 MG tablet Take 1 tablet (10 mg total) by mouth every 6 (six) hours as needed (Nausea or vomiting).   rosuvastatin (CRESTOR) 10 MG tablet Take 10 mg by mouth daily.   tobramycin-dexamethasone (TOBRADEX) ophthalmic solution Place 1 drop into both eyes in the morning and at bedtime.   Turmeric (QC TUMERIC COMPLEX PO) Take by mouth.   valACYclovir (VALTREX) 1000 MG tablet Take 1 tablet (1,000 mg total) by  mouth daily.   [DISCONTINUED] carvedilol (COREG) 3.125 MG tablet TAKE 1 TABLET (3.125 MG TOTAL) BY MOUTH 2 (TWO) TIMES DAILY WITH A MEAL.   [DISCONTINUED] lisinopril (ZESTRIL) 20 MG tablet TAKE 1 TABLET BY MOUTH EVERY DAY IN THE MORNING     PAST MEDICAL HISTORY: Past Medical History:  Diagnosis Date   Allergy 2000   Asthma    as child   Breast cancer ([place])    Family history of lung  cancer 03/08/2021   Family history of uterine cancer 03/08/2021   GERD (gastroesophageal reflux disease)    Headache    History of COVID-19    Hyperlipidemia    Hypertension    Prediabetes    right breast ca dx'd 02/2021   Trimalleolar fracture of ankle, closed, left, initial encounter     PAST SURGICAL HISTORY: Past Surgical History:  Procedure Laterality Date   BREAST BIOPSY Right 03/22/2021   Procedure: PUNCH BIOPSY OF RIGHT AREOLA;  Surgeon: Stark Klein, MD;  Location: [facility];  Service: General;  Laterality: Right;   FRACTURE SURGERY  2018   NO PAST SURGERIES     ORIF ANKLE FRACTURE Left 07/10/2017   Procedure: OPEN REDUCTION INTERNAL FIXATION (ORIF) trimallolar ANKLE FRACTURE;  Surgeon: Wylene Simmer, MD;  Location: [facility];  Service: Orthopedics;  Laterality: Left;   PORTACATH PLACEMENT Left 03/22/2021   Procedure: INSERTION PORT-A-CATH;  Surgeon: Stark Klein, MD;  Location: [facility];  Service: General;  Laterality: Left;    FAMILY HISTORY: The patient family history includes Diabetes in her brother and father; Early death in her mother; Hearing loss in her maternal grandmother; Heart disease in her mother; Hypertension in her father, maternal grandmother, and mother; Lung cancer in an other family member; Throat cancer in an other family member; Uterine cancer (age of onset: 32) in an other family member.  SOCIAL HISTORY:  The patient  reports that she has never smoked. She has never used smokeless tobacco. She reports previous alcohol use. She reports that she does not use drugs.  REVIEW OF SYSTEMS: Review of Systems  Constitutional: Negative for chills and fever.  HENT:  Negative for hoarse voice and nosebleeds.   Eyes:  Negative for discharge, double vision and pain.  Cardiovascular:  Positive for palpitations (Improved). Negative for chest pain, claudication, dyspnea on exertion, leg swelling, near-syncope, orthopnea,  paroxysmal nocturnal dyspnea and syncope.  Respiratory:  Negative for hemoptysis and shortness of breath.   Musculoskeletal:  Negative for muscle cramps and myalgias.  Gastrointestinal:  Negative for abdominal pain, constipation, diarrhea, hematemesis, hematochezia, melena, nausea and vomiting.  Neurological:  Negative for dizziness and light-headedness.   PHYSICAL EXAM: Vitals with BMI 05/24/2021 05/10/2021 05/08/2021  Height 5' 4"  - 5' 4"   Weight 230 lbs - 237 lbs 3 oz  BMI 80.16 - 55.3  Systolic 748 270 786  Diastolic 79 89 84  Pulse 754 75 95    CONSTITUTIONAL: Well-developed and well-nourished. No acute distress.  SKIN: Skin is warm and dry. No rash noted. No cyanosis. No pallor. No jaundice HEAD: Normocephalic and atraumatic.  EYES: No scleral icterus MOUTH/THROAT: Moist oral membranes.  NECK: No JVD present. No thyromegaly noted. No carotid bruits  LYMPHATIC: No visible cervical adenopathy.  CHEST Normal respiratory effort. No intercostal retractions  LUNGS: Clear to auscultation bilaterally.  No stridor. No wheezes. No rales.  CARDIOVASCULAR: Regular rate and rhythm, positive S1-S2, no murmurs rubs or gallops appreciated. ABDOMINAL: No apparent ascites.  EXTREMITIES:  No peripheral edema  HEMATOLOGIC: No significant bruising NEUROLOGIC: Oriented to person, place, and time. Nonfocal. Normal muscle tone.  PSYCHIATRIC: Normal mood and affect. Normal behavior. Cooperative  CARDIAC DATABASE: EKG: 04/12/2021: Sinus tachycardia, 102 bpm, low voltage in the precordial leads, poor R wave progression, without underlying injury pattern.   Echocardiogram: 03/13/2021: LVEF 60-65%, no regional wall motion abnormalities, global longitudinal strain -24.7%, right ventricular size and function normal, no significant valvular heart disease, right atrial pressure approximately 3 mmHg.   Stress Testing: No results found for this or any previous visit from the past 1095 days.  Heart  Catheterization: None  3 day extended Holter monitor: Dominant rhythm normal sinus rhythm. Heart rate 70-156 bpm.  Avg HR 102 bpm. No atrial fibrillation, ventricular tachycardia, high grade AV block, pauses (3 seconds or longer). Total ventricular ectopic burden <1%.   Total supraventricular ectopic burden <1%. Patient triggered events: 0.    LABORATORY DATA: CBC Latest Ref Rng & Units 05/08/2021 05/02/2021 04/17/2021  WBC 4.0 - 10.5 K/uL 12.4(H) 10.3 12.2(H)  Hemoglobin 12.0 - 15.0 g/dL 10.8(L) 12.0 11.5(L)  Hematocrit 36.0 - 46.0 % 32.2(L) 35.2(L) 33.9(L)  Platelets 150 - 400 K/uL 324 136(L) 285    CMP Latest Ref Rng & Units 05/08/2021 05/02/2021 04/17/2021  Glucose 70 - 99 mg/dL 190(H) 122(H) 157(H)  BUN 6 - 20 mg/dL 11 6 8   Creatinine 0.44 - 1.00 mg/dL 0.81 0.86 0.89  Sodium 135 - 145 mmol/L 139 142 139  Potassium 3.5 - 5.1 mmol/L 3.9 3.5 4.1  Chloride 98 - 111 mmol/L 107 107 108  CO2 22 - 32 mmol/L 23 28 25   Calcium 8.9 - 10.3 mg/dL 8.8(L) 8.8(L) 9.0  Total Protein 6.5 - 8.1 g/dL 6.4(L) 5.9(L) 6.7  Total Bilirubin 0.3 - 1.2 mg/dL 0.4 0.4 0.3  Alkaline Phos 38 - 126 U/L 67 71 66  AST 15 - 41 U/L 8(L) 13(L) 14(L)  ALT 0 - 44 U/L 11 13 28     Lipid Panel     Component Value Date/Time   CHOL 168 08/03/2008 0927   TRIG 73 08/03/2008 0927   TRIG 104 11/12/2006 1022   HDL 26.4 (L) 08/03/2008 0927   CHOLHDL 6.4 CALC 08/03/2008 0927   VLDL 15 08/03/2008 0927   LDLCALC 127 (H) 08/03/2008 0927    No components found for: NTPROBNP No results for input(s): PROBNP in the last 8760 hours. No results for input(s): TSH in the last 8760 hours.  BMP Recent Labs    04/17/21 1101 05/02/21 1404 05/08/21 0940  NA 139 142 139  K 4.1 3.5 3.9  CL 108 107 107  CO2 25 28 23   GLUCOSE 157* 122* 190*  BUN 8 6 11   CREATININE 0.89 0.86 0.81  CALCIUM 9.0 8.8* 8.8*  GFRNONAA >60 >60 >60    HEMOGLOBIN A1C Lab Results  Component Value Date   HGBA1C 5.2 11/12/2006   External Labs:   Date Collected: 02/03/2021 , information obtained by PCP Potassium: 3.5 Creatinine 0.80 mg/dL. eGFR: 105 mL/min per 1.73 m Hemoglobin: 13.7 g/dL and hematocrit: 40.3 % Lipid profile: Total cholesterol 188 , triglycerides 159 , HDL 30 , LDL 131, non-HDL 158 AST: 8 , ALT: 6 , alkaline phosphatase: 52  Hemoglobin A1c: 5.7 TSH: 1.41   IMPRESSION:    ICD-10-CM   1. Tachycardia  R00.0 carvedilol (COREG) 6.25 MG tablet    2. Palpitations  R00.2 carvedilol (COREG) 6.25 MG tablet    3. History of COVID-19  Z86.16  4. Benign hypertension  I10 lisinopril (ZESTRIL) 20 MG tablet    carvedilol (COREG) 6.25 MG tablet    5. Mixed hyperlipidemia  E78.2     6. Class 3 severe obesity due to excess calories with serious comorbidity and body mass index (BMI) of 40.0 to 44.9 in adult [facility])  E66.01    Z68.41        RECOMMENDATIONS: Xara AMILLIANA HAYWORTH is a 45 y.o. female whose past medical history and cardiac risk factors include: Hx of COVID 2021, right sided breast cancer (dx March 2022) currently on chemotherapy, hyperlipidemia, hypertension, GERD, obesity due to excess calorie.   Tachycardia/palpitations: Improved. Uptitrate carvedilol to 6.25 mg p.o. twice daily. No known reversible causes. Reviewed the extended Holter monitor results with the patient. Continue to monitor.  Benign essential hypertension: Given her right-sided breast cancer diagnosed in March 2022 changed her medications to cardioprotective drugs such as carvedilol and lisinopril. She is tolerating the medication well without any side effects or intolerances. Home blood pressures have improved with a SBP averaging 135 mmHg.    Recently diagnosed with right-sided breast cancer: Currently on chemotherapy. Recommend initiating/transitioning to cardioprotective medications: Uptitrate carvedilol and continue lisinopril.   Patient is scheduled for a repeat echocardiogram.    Hyperlipidemia: Continue statin  therapy. Most recent lipid profile reviewed. Currently managed by primary care provider.  FINAL MEDICATION LIST END OF ENCOUNTER: Meds ordered this encounter  Medications   lisinopril (ZESTRIL) 20 MG tablet    Sig: Take 1 tablet (20 mg total) by mouth every morning.    Dispense:  90 tablet    Refill:  1   carvedilol (COREG) 6.25 MG tablet    Sig: Take 1 tablet (6.25 mg total) by mouth 2 (two) times daily.    Dispense:  180 tablet    Refill:  3    Medications Discontinued During This Encounter  Medication Reason   carvedilol (COREG) 3.125 MG tablet Dose change   lisinopril (ZESTRIL) 20 MG tablet Reorder     Current Outpatient Medications:    acetaminophen (TYLENOL) 500 MG tablet, Take 1 tablet (500 mg total) by mouth 3 (three) times daily with meals as needed. Take with aleve 220 mg tablet, Disp: 90 tablet, Rfl: 0   carvedilol (COREG) 6.25 MG tablet, Take 1 tablet (6.25 mg total) by mouth 2 (two) times daily., Disp: 180 tablet, Rfl: 3   dexamethasone (DECADRON) 4 MG tablet, Take 2 tablets (8 mg total) by mouth 2 (two) times daily. Start the day before Taxotere. Then take daily x 3 days after chemotherapy., Disp: 30 tablet, Rfl: 1   Fexofenadine HCl (ALLEGRA ALLERGY PO), Take by mouth., Disp: , Rfl:    lidocaine-prilocaine (EMLA) cream, Apply to affected area once, Disp: 30 g, Rfl: 3   loratadine (CLARITIN) 10 MG tablet, Take 1 tablet (10 mg total) by mouth daily. (Patient taking differently: Take 10 mg by mouth daily as needed.), Disp: 90 tablet, Rfl: 4   Multiple Vitamins-Minerals (MULTIVITAMIN WITH MINERALS) tablet, Take 1 tablet by mouth daily., Disp: , Rfl:    naproxen sodium (ALEVE) 220 MG tablet, Take 220 mg by mouth., Disp: , Rfl:    ondansetron (ZOFRAN) 8 MG tablet, Take 1 tablet (8 mg total) by mouth 2 (two) times daily as needed (Nausea or vomiting). Start on the third day after chemotherapy., Disp: 30 tablet, Rfl: 1   pantoprazole (PROTONIX) 20 MG tablet, Take 20 mg by mouth  daily., Disp: , Rfl:    prochlorperazine (COMPAZINE)  10 MG tablet, Take 1 tablet (10 mg total) by mouth every 6 (six) hours as needed (Nausea or vomiting)., Disp: 30 tablet, Rfl: 1   rosuvastatin (CRESTOR) 10 MG tablet, Take 10 mg by mouth daily., Disp: , Rfl:    tobramycin-dexamethasone (TOBRADEX) ophthalmic solution, Place 1 drop into both eyes in the morning and at bedtime., Disp: 5 mL, Rfl: 2   Turmeric (QC TUMERIC COMPLEX PO), Take by mouth., Disp: , Rfl:    valACYclovir (VALTREX) 1000 MG tablet, Take 1 tablet (1,000 mg total) by mouth daily., Disp: 60 tablet, Rfl: 1   lisinopril (ZESTRIL) 20 MG tablet, Take 1 tablet (20 mg total) by mouth every morning., Disp: 90 tablet, Rfl: 1  No orders of the defined types were placed in this encounter.   There are no Patient Instructions on file for this visit.   --Continue cardiac medications as reconciled in final medication list. --Return in about 3 months (around 08/24/2021) for Follow up tachycardia/palpitations.. Or sooner if needed. --Continue follow-up with your primary care physician regarding the management of your other chronic comorbid conditions.  Patient's questions and concerns were addressed to her satisfaction. She voices understanding of the instructions provided during this encounter.   This note was created using a voice recognition software as a result there may be grammatical errors inadvertently enclosed that do not reflect the nature of this encounter. Every attempt is made to correct such errors.  Rex Kras, [place], [facility]  Pager: [phone] Office: [phone]

## 2021-05-29 ENCOUNTER — Other Ambulatory Visit: Payer: No Typology Code available for payment source

## 2021-05-29 ENCOUNTER — Ambulatory Visit: Payer: No Typology Code available for payment source

## 2021-05-29 NOTE — Progress Notes (Signed)
[place]  [phone] [phone]     ID: Diane Miranda DOB: October 17, 1976  MR#: [id number]  QPY#:[id number]  Patient Care Team: [name], MD as PCP - General (Internal Medicine) Mauro Kaufmann, RN as Oncology Nurse Navigator Rockwell Germany, RN as Oncology Nurse Navigator Stark Klein, MD as Consulting Physician (General Surgery) Rafiel Mecca, Virgie Dad, MD as Consulting Physician (Oncology) Gery Pray, MD as Consulting Physician (Radiation Oncology) Wylene Simmer, MD as Consulting Physician (Orthopedic Surgery) Chauncey Cruel, MD OTHER MD:  CHIEF COMPLAINT: triple positive breast cancer  CURRENT TREATMENT: Neoadjuvant chemotherapy   INTERVAL HISTORY: Neylan returns today for follow up and treatment of her triple positive breast cancer.  Recall she began neoadjuvant chemotherapy, consisting of carboplatin, docetaxel, trastuzumab and pertuzumab every 21 days x 6, on 03/28/2021.  Today is day 1 cycle 4.  Since her last visit here she was evaluated by cardiology for her tachycardia, dating back to her COVID infection; Holter showed sinus tachycardia and no reversible causes were noted. She continues on carvedilol with good control   REVIEW OF SYSTEMS: Sanii is having significant taste perversion and right now even the water tastes horrible.  She is having trouble keeping her for bottle stand but she is trying various things including PhD which is helping.  She had diarrhea on day 4, but took Imodium and that stopped it and then on day 8 she had diarrhea again again took Imodium and again that stopped it.  Yesterday she had 2 soft bowel movements, not diarrhea.  Her port is working well.  She has no peripheral neuropathy symptoms.  She is trying to walk at least twice a week although the heat now makes it more difficult.  Detailed review of systems today was otherwise stable.   COVID 19 VACCINATION STATUS: refuses vaccination;  infection 10/2019   HISTORY OF CURRENT ILLNESS: From the original intake note:  Diane Miranda herself palpated a right breast mass and noted associated pain. She underwent bilateral diagnostic mammography with tomography and right breast ultrasonography at [facility] on 02/15/2021 showing: breast density category B; palpable 5.4 cm mass within outer right breast; two abnormal right axillary lymph nodes; 1.8 cm indeterminate hypoechoic area within upper-outer right breast; no evidence of left breast malignancy.  Accordingly on 02/23/2021 she proceeded to biopsy of the right breast areas in question. The pathology from this procedure ([id number]) showed:  1. Right Breast, 11 o'clock  - adenosis and columnar cell hyperplasia; this was concordant 2. Right Breast, 9 o'clock  - invasive ductal carcinoma, grade 3  - Prognostic indicators significant for: estrogen receptor, 40% positive with moderate staining intensity and progesterone receptor, 60% positive with strong staining intensity. Proliferation marker Ki67 at 35%. HER2 positive by immunohistochemistry (3+). 3. Right Axilla, lymph node  - invasive ductal carcinoma involving nodal tissue  She underwent breast MRI on 03/04/2021 showing: breast composition B; 4.7 cm mass in 8-9 o'clock position of posterior right breast represents recently diagnosed malignancy; two adjacent abnormal right axillary lymph nodes, one of which represents the biopsy-proven metastatic node; significant skin thickening in periareolar right breast, with significant increased T2 signal consistent with edema; mild abnormal enhancement within thickened skin, nonspecific.  There was also a 1.1 cm nonspecific enhancing mass in left breast at 2-3 o'clock.  Cancer Staging Malignant neoplasm of upper-outer quadrant of right breast in female, estrogen receptor positive ([place]) Staging form: Breast, AJCC 8th Edition - Clinical: Stage IIB (cT3, cN1, cM0,  G3, ER+, PR+,  HER2+) - Signed by Chauncey Cruel, MD on 03/07/2021 Histologic grading system: 3 grade system  The patient's subsequent history is as detailed below.   PAST MEDICAL HISTORY: Past Medical History:  Diagnosis Date   Allergy 2000   Asthma    as child   Breast cancer ([place])    Family history of lung cancer 03/08/2021   Family history of uterine cancer 03/08/2021   GERD (gastroesophageal reflux disease)    Headache    History of COVID-19    Hyperlipidemia    Hypertension    Prediabetes    right breast ca dx'd 2021-03-18   Trimalleolar fracture of ankle, closed, left, initial encounter     PAST SURGICAL HISTORY: Past Surgical History:  Procedure Laterality Date   BREAST BIOPSY Right 03/22/2021   Procedure: PUNCH BIOPSY OF RIGHT AREOLA;  Surgeon: Stark Klein, MD;  Location: [facility];  Service: General;  Laterality: Right;   FRACTURE SURGERY  2018   NO PAST SURGERIES     ORIF ANKLE FRACTURE Left 07/10/2017   Procedure: OPEN REDUCTION INTERNAL FIXATION (ORIF) trimallolar ANKLE FRACTURE;  Surgeon: Wylene Simmer, MD;  Location: [facility];  Service: Orthopedics;  Laterality: Left;   PORTACATH PLACEMENT Left 03/22/2021   Procedure: INSERTION PORT-A-CATH;  Surgeon: Stark Klein, MD;  Location: [facility];  Service: General;  Laterality: Left;    FAMILY HISTORY: Family History  Problem Relation Age of Onset   Diabetes Father    Hypertension Father    Early death Mother    Heart disease Mother    Hypertension Mother    Hearing loss Maternal Grandmother    Hypertension Maternal Grandmother    Uterine cancer Other 85       MGM's sister   Throat cancer Other        MGM's brother; dx after 34   Lung cancer Other        PGM's sister; dx 38s   Diabetes Brother    Her father is living at age 75 as of Mar 18, 2021. Her mother died at age 2 from [place]. Diane Miranda has two brothers (and no sisters). She reports endometrial cancer in a maternal great aunt and  lung cancer in a paternal aunt. There is no other family history of cancer to her knowledge.   GYNECOLOGIC HISTORY:  No LMP recorded. Menarche: 45 years old Age at first live birth: 45 years old [place] P 2 LMP early 03/18/2021 (has since had IUD placed) Contraceptive: IUD in place [place]), previously used pills from 1996-2006 HRT n/a  Hysterectomy? no BSO? 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 She is single. She lives at home with daughter Joylene Draft (age 62), adopted daughter Hetty Blend (age 79), and the patient's maternal grandmother, who has significant dementia issues. Daughter Levy Pupa, age 51, works in Press photographer in [place]. 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    ADVANCED DIRECTIVES: not in place; however the patient made it clear during her 03/07/2021 visit that she would not accept blood product transfusions for any reason including immediately life saving reasons   HEALTH MAINTENANCE: Social History   Tobacco Use   Smoking status: Never   Smokeless tobacco: Never  Vaping Use   Vaping Use: Never used  Substance Use Topics   Alcohol use: Not Currently    Alcohol/week: 0.0 standard drinks    Comment: social   Drug use:  No     Colonoscopy: n/a (age)  PAP: 2020  Bone density: n/a (age)   Allergies  Allergen Reactions   Hydrochlorothiazide Anaphylaxis and Swelling    Current Outpatient Medications  Medication Sig Dispense Refill   acetaminophen (TYLENOL) 500 MG tablet Take 1 tablet (500 mg total) by mouth 3 (three) times daily with meals as needed. Take with aleve 220 mg tablet 90 tablet 0   carvedilol (COREG) 6.25 MG tablet Take 1 tablet (6.25 mg total) by mouth 2 (two) times daily. 180 tablet 3   dexamethasone (DECADRON) 4 MG tablet Take 2 tablets (8 mg total) by mouth 2 (two) times daily. Start the day before Taxotere. Then take daily x 3 days after chemotherapy. 30 tablet 1   Fexofenadine  HCl (ALLEGRA ALLERGY PO) Take by mouth.     lidocaine-prilocaine (EMLA) cream Apply to affected area once 30 g 3   lisinopril (ZESTRIL) 20 MG tablet Take 1 tablet (20 mg total) by mouth every morning. 90 tablet 1   loratadine (CLARITIN) 10 MG tablet Take 1 tablet (10 mg total) by mouth daily. (Patient taking differently: Take 10 mg by mouth daily as needed.) 90 tablet 4   Multiple Vitamins-Minerals (MULTIVITAMIN WITH MINERALS) tablet Take 1 tablet by mouth daily.     naproxen sodium (ALEVE) 220 MG tablet Take 220 mg by mouth.     ondansetron (ZOFRAN) 8 MG tablet Take 1 tablet (8 mg total) by mouth 2 (two) times daily as needed (Nausea or vomiting). Start on the third day after chemotherapy. 30 tablet 1   pantoprazole (PROTONIX) 20 MG tablet Take 20 mg by mouth daily.     prochlorperazine (COMPAZINE) 10 MG tablet Take 1 tablet (10 mg total) by mouth every 6 (six) hours as needed (Nausea or vomiting). 30 tablet 1   rosuvastatin (CRESTOR) 10 MG tablet Take 10 mg by mouth daily.     tobramycin-dexamethasone (TOBRADEX) ophthalmic solution Place 1 drop into both eyes in the morning and at bedtime. 5 mL 2   Turmeric (QC TUMERIC COMPLEX PO) Take by mouth.     valACYclovir (VALTREX) 1000 MG tablet Take 1 tablet (1,000 mg total) by mouth daily. 60 tablet 1   No current facility-administered medications for this visit.    OBJECTIVE: African-American woman in no acute distress  Vitals:   05/30/21 1214  BP: (!) 146/90  Pulse: 95  Resp: 18  Temp: (!) 95.2 F (35.1 C)  SpO2: 99%     Body mass index is 39.72 kg/m.   Wt Readings from Last 3 Encounters:  05/30/21 231 lb 6.4 oz (105 kg)  05/24/21 230 lb (104.3 kg)  05/08/21 237 lb 3.2 oz (107.6 kg)      ECOG FS:1 - Symptomatic but completely ambulatory  Sclerae unicteric, EOMs intact Wearing a mask No cervical or supraclavicular adenopathy Lungs no rales or rhonchi Heart regular rate and rhythm Abd soft, nontender, positive bowel sounds MSK  no focal spinal tenderness, no upper extremity lymphedema Neuro: nonfocal, well oriented, appropriate affect Breasts: Have not been able to feel the mass in the breast since his second cycle and today very careful examination at the 7:00 area in the right breast shows no palpable mass no skin change of concern.  Both axillae are benign.   LAB RESULTS:  CMP     Component Value Date/Time   NA 139 05/08/2021 0940   K 3.9 05/08/2021 0940   CL 107 05/08/2021 0940   CO2 23 05/08/2021 0940  GLUCOSE 190 (H) 05/08/2021 0940   GLUCOSE 105 (H) 11/12/2006 1022   BUN 11 05/08/2021 0940   CREATININE 0.81 05/08/2021 0940   CREATININE 0.92 03/07/2021 1219   CALCIUM 8.8 (L) 05/08/2021 0940   PROT 6.4 (L) 05/08/2021 0940   ALBUMIN 3.4 (L) 05/08/2021 0940   AST 8 (L) 05/08/2021 0940   AST 13 (L) 03/07/2021 1219   ALT 11 05/08/2021 0940   ALT 16 03/07/2021 1219   ALKPHOS 67 05/08/2021 0940   BILITOT 0.4 05/08/2021 0940   BILITOT 0.6 03/07/2021 1219   GFRNONAA >60 05/08/2021 0940   GFRNONAA >60 03/07/2021 1219   GFRAA >90 01/30/2015 1319    No results found for: TOTALPROTELP, ALBUMINELP, A1GS, A2GS, BETS, BETA2SER, GAMS, MSPIKE, SPEI  Lab Results  Component Value Date   WBC 9.7 05/30/2021   NEUTROABS 8.4 (H) 05/30/2021   HGB 10.9 (L) 05/30/2021   HCT 31.0 (L) 05/30/2021   MCV 93.1 05/30/2021   PLT 262 05/30/2021    No results found for: LABCA2  No components found for: SWHQPR916  No results for input(s): INR in the last 168 hours.  No results found for: LABCA2  No results found for: BWG665  No results found for: LDJ570  No results found for: VXB939  No results found for: CA2729  No components found for: HGQUANT  No results found for: CEA1 / No results found for: CEA1   No results found for: AFPTUMOR  No results found for: CHROMOGRNA  No results found for: KPAFRELGTCHN, LAMBDASER, KAPLAMBRATIO (kappa/lambda light chains)  No results found for: HGBA, HGBA2QUANT,  HGBFQUANT, HGBSQUAN (Hemoglobinopathy evaluation)   No results found for: LDH  No results found for: IRON, TIBC, IRONPCTSAT (Iron and TIBC)  No results found for: FERRITIN  Urinalysis    Component Value Date/Time   COLORURINE YELLOW 01/30/2015 [place] 01/30/2015 1343   LABSPEC 1.011 01/30/2015 1343   PHURINE 7.5 01/30/2015 1343   GLUCOSEU NEGATIVE 01/30/2015 1343   HGBUR NEGATIVE 01/30/2015 1343   BILIRUBINUR NEGATIVE 01/30/2015 1343   KETONESUR NEGATIVE 01/30/2015 1343   PROTEINUR NEGATIVE 01/30/2015 1343   UROBILINOGEN 0.2 01/30/2015 1343   NITRITE NEGATIVE 01/30/2015 1343   LEUKOCYTESUR SMALL (A) 01/30/2015 1343    STUDIES: LONG TERM MONITOR (3-14 DAYS)  Result Date: 05/20/2021 3 day extended Holter monitor: Dominant rhythm normal sinus rhythm. Heart rate 70-156 bpm.  Avg HR 102 bpm. No atrial fibrillation, ventricular tachycardia, high grade AV block, pauses (3 seconds or longer). Total ventricular ectopic burden <1%. Total supraventricular ectopic burden <1%. Patient triggered events: 0.       ELIGIBLE FOR AVAILABLE RESEARCH PROTOCOL:no  ASSESSMENT: 45 y.o. [place] woman status post right breast upper outer quadrant biopsy 02/23/2021 for a clinical T3 N1, stage IIB invasive ductal carcinoma, grade 3, triple positive, with an MIB-1 of 35%  (a) right periareolar biopsy 02/23/2021 was benign/concordant  (b) left breast nodule biopsy 03/14/2021 benign  (c) Punch biopsy of the right areolar area 03/22/2021 no malignancy  (d) CT of the chest abdomen and pelvis 03/20/2021 shows no evidence of metastatic disease; there is 2.5 cm cystic pancreatic mass and a 0.4 cm right liver lobe lesion requiring further follow-up  (e) bone scan 03/25/2021 shows no bone metastases  (1) genetics testing 03/26/2021 through the  [employer]Cancer+RNAinsight panel offered by [facility] found no deleterious mutations in APC, ATM, AXIN2, BARD1, BMPR1A, BRCA1, BRCA2, BRIP1,  CDH1, CDK4, CDKN2A, CHEK2, DICER1, EPCAM, GREM1, HOXB13, MEN1, MLH1, MSH2, MSH3, MSH6,  MUTYH, NBN, NF1, NF2, NTHL1, PALB2, PMS2, POLD1, POLE, PTEN, RAD51C, RAD51D, RECQL, RET, SDHA, SDHAF2, SDHB, SDHC, SDHD, SMAD4, SMARCA4, STK11, TP53, TSC1, TSC2, and VHL.  RNA data is routinely analyzed for use in variant interpretation for all genes.   (2) neoadjuvant chemotherapy consisting of carboplatin, docetaxel, trastuzumab and pertuzumab every 21 days x 6 beginning 03/28/2021  (3) anti-HER-2 immunotherapy to continue to total 1 year  (a) echo 03/13/2021 showed an ejection fraction in the 60- 65% range  (4) definitive surgery to follow  (5) adjuvant radiation  (6) antiestrogens   PLAN: Bee continues to tolerate her chemotherapy remarkably well and importantly there has been no development of peripheral neuropathy.  She is managing the diarrhea due to the Pertuzumab with minimal doses of Imodium.  She is continuing to exercise.  She is making great efforts at hydrating herself adequately despite the taste alteration due to chemotherapy.  She has her fourth treatment today then her fifth in 3 weeks and her last treatment and 6 weeks.  7 weeks from now I have scheduled her for a breast MRI to assess response and set her up for surgery.  I have also entered an order for an echocardiogram late this month  Total encounter time 25 minutes.Sarajane Jews C. Yeily Link, MD 05/30/2021 12:24 PM Medical Oncology and Hematology [facility] [place], [place] [address] Tel. [phone]    Fax. [phone]   This document serves as a record of services personally performed by Lurline Del, MD. It was created on his behalf by Wilburn Mylar, a trained medical scribe. The creation of this record is based on the scribe's personal observations and the provider's statements to them.   I, Lurline Del MD, have reviewed the above documentation for accuracy and completeness, and  I agree with the above.   *Total Encounter Time as defined by the Centers for Medicare and Medicaid Services includes, in addition to the face-to-face time of a patient visit (documented in the note above) non-face-to-face time: obtaining and reviewing outside history, ordering and reviewing medications, tests or procedures, care coordination (communications with other health care professionals or caregivers) and documentation in the medical record.

## 2021-05-30 ENCOUNTER — Inpatient Hospital Stay: Payer: No Typology Code available for payment source

## 2021-05-30 ENCOUNTER — Inpatient Hospital Stay: Payer: No Typology Code available for payment source | Attending: Oncology

## 2021-05-30 ENCOUNTER — Inpatient Hospital Stay (HOSPITAL_BASED_OUTPATIENT_CLINIC_OR_DEPARTMENT_OTHER): Payer: No Typology Code available for payment source | Admitting: Oncology

## 2021-05-30 ENCOUNTER — Encounter: Payer: Self-pay | Admitting: Licensed Clinical Social Worker

## 2021-05-30 ENCOUNTER — Encounter: Payer: Self-pay | Admitting: *Deleted

## 2021-05-30 ENCOUNTER — Other Ambulatory Visit: Payer: Self-pay

## 2021-05-30 ENCOUNTER — Other Ambulatory Visit: Payer: No Typology Code available for payment source

## 2021-05-30 VITALS — BP 146/90 | HR 95 | Temp 95.2°F | Resp 18 | Ht 64.0 in | Wt 231.4 lb

## 2021-05-30 DIAGNOSIS — Z95828 Presence of other vascular implants and grafts: Secondary | ICD-10-CM

## 2021-05-30 DIAGNOSIS — Z170 Estrogen receptor positive status [ER+]: Secondary | ICD-10-CM

## 2021-05-30 DIAGNOSIS — C50411 Malignant neoplasm of upper-outer quadrant of right female breast: Secondary | ICD-10-CM

## 2021-05-30 DIAGNOSIS — Z5111 Encounter for antineoplastic chemotherapy: Secondary | ICD-10-CM | POA: Insufficient documentation

## 2021-05-30 DIAGNOSIS — Z79899 Other long term (current) drug therapy: Secondary | ICD-10-CM | POA: Insufficient documentation

## 2021-05-30 LAB — COMPREHENSIVE METABOLIC PANEL
ALT: 19 U/L (ref 0–44)
AST: 14 U/L — ABNORMAL LOW (ref 15–41)
Albumin: 3.5 g/dL (ref 3.5–5.0)
Alkaline Phosphatase: 66 U/L (ref 38–126)
Anion gap: 9 (ref 5–15)
BUN: 8 mg/dL (ref 6–20)
CO2: 23 mmol/L (ref 22–32)
Calcium: 9.4 mg/dL (ref 8.9–10.3)
Chloride: 108 mmol/L (ref 98–111)
Creatinine, Ser: 0.8 mg/dL (ref 0.44–1.00)
GFR, Estimated: >60 mL/min (ref >60)
Glucose, Bld: 133 mg/dL — ABNORMAL HIGH (ref 70–99)
Potassium: 4 mmol/L (ref 3.5–5.1)
Sodium: 140 mmol/L (ref 135–145)
Total Bilirubin: 0.5 mg/dL (ref 0.3–1.2)
Total Protein: 6.6 g/dL (ref 6.5–8.1)

## 2021-05-30 LAB — CBC WITH DIFFERENTIAL/PLATELET
Abs Immature Granulocytes: 0.03 10*3/uL (ref 0.00–0.07)
Basophils Absolute: 0 10*3/uL (ref 0.0–0.1)
Basophils Relative: 0 %
Eosinophils Absolute: 0 10*3/uL (ref 0.0–0.5)
Eosinophils Relative: 0 %
HCT: 31 % — ABNORMAL LOW (ref 36.0–46.0)
Hemoglobin: 10.9 g/dL — ABNORMAL LOW (ref 12.0–15.0)
Immature Granulocytes: 0 %
Lymphocytes Relative: 10 %
Lymphs Abs: 1 10*3/uL (ref 0.7–4.0)
MCH: 32.7 pg (ref 26.0–34.0)
MCHC: 35.2 g/dL (ref 30.0–36.0)
MCV: 93.1 fL (ref 80.0–100.0)
Monocytes Absolute: 0.4 10*3/uL (ref 0.1–1.0)
Monocytes Relative: 4 %
Neutro Abs: 8.4 10*3/uL — ABNORMAL HIGH (ref 1.7–7.7)
Neutrophils Relative %: 86 %
Platelets: 262 10*3/uL (ref 150–400)
RBC: 3.33 MIL/uL — ABNORMAL LOW (ref 3.87–5.11)
RDW: 19.7 % — ABNORMAL HIGH (ref 11.5–15.5)
WBC: 9.7 10*3/uL (ref 4.0–10.5)
nRBC: 0 % (ref 0.0–0.2)

## 2021-05-30 MED ORDER — DIPHENHYDRAMINE HCL 25 MG PO CAPS
25.0000 mg | ORAL_CAPSULE | Freq: Once | ORAL | Status: AC
  Administered 2021-05-30: 25 mg via ORAL

## 2021-05-30 MED ORDER — SODIUM CHLORIDE 0.9 % IV SOLN
Freq: Once | INTRAVENOUS | Status: AC
Start: 2021-05-30 — End: 2021-05-30
  Filled 2021-05-30: qty 250

## 2021-05-30 MED ORDER — SODIUM CHLORIDE 0.9 % IV SOLN
150.0000 mg | Freq: Once | INTRAVENOUS | Status: AC
  Administered 2021-05-30: 150 mg via INTRAVENOUS
  Filled 2021-05-30: qty 150

## 2021-05-30 MED ORDER — SODIUM CHLORIDE 0.9 % IV SOLN
420.0000 mg | Freq: Once | INTRAVENOUS | Status: AC
  Administered 2021-05-30: 420 mg via INTRAVENOUS
  Filled 2021-05-30: qty 14

## 2021-05-30 MED ORDER — ACETAMINOPHEN 325 MG PO TABS
650.0000 mg | ORAL_TABLET | Freq: Once | ORAL | Status: AC
  Administered 2021-05-30: 650 mg via ORAL

## 2021-05-30 MED ORDER — SODIUM CHLORIDE 0.9 % IV SOLN
700.0000 mg | Freq: Once | INTRAVENOUS | Status: AC
  Administered 2021-05-30: 700 mg via INTRAVENOUS
  Filled 2021-05-30: qty 70

## 2021-05-30 MED ORDER — PALONOSETRON HCL INJECTION 0.25 MG/5ML
0.2500 mg | Freq: Once | INTRAVENOUS | Status: AC
Start: 2021-05-30 — End: 2021-05-30
  Administered 2021-05-30: 0.25 mg via INTRAVENOUS

## 2021-05-30 MED ORDER — SODIUM CHLORIDE 0.9 % IV SOLN
10.0000 mg | Freq: Once | INTRAVENOUS | Status: AC
  Administered 2021-05-30: 10 mg via INTRAVENOUS
  Filled 2021-05-30: qty 10

## 2021-05-30 MED ORDER — DIPHENHYDRAMINE HCL 25 MG PO CAPS
ORAL_CAPSULE | ORAL | Status: AC
  Filled 2021-05-30: qty 1

## 2021-05-30 MED ORDER — ACETAMINOPHEN 325 MG PO TABS
ORAL_TABLET | ORAL | Status: AC
  Filled 2021-05-30: qty 2

## 2021-05-30 MED ORDER — SODIUM CHLORIDE 0.9% FLUSH
10.0000 mL | INTRAVENOUS | Status: AC | PRN
  Administered 2021-05-30: 10 mL
  Filled 2021-05-30: qty 10

## 2021-05-30 MED ORDER — PALONOSETRON HCL INJECTION 0.25 MG/5ML
INTRAVENOUS | Status: AC
  Filled 2021-05-30: qty 5

## 2021-05-30 MED ORDER — TRASTUZUMAB-ANNS CHEMO 150 MG IV SOLR
6.0000 mg/kg | Freq: Once | INTRAVENOUS | Status: AC
  Administered 2021-05-30: 651 mg via INTRAVENOUS
  Filled 2021-05-30: qty 31

## 2021-05-30 MED ORDER — SODIUM CHLORIDE 0.9 % IV SOLN
75.0000 mg/m2 | Freq: Once | INTRAVENOUS | Status: AC
  Administered 2021-05-30: 170 mg via INTRAVENOUS
  Filled 2021-05-30: qty 17

## 2021-05-30 NOTE — Patient Instructions (Signed)
[facility]  Discharge Instructions: Thank you for choosing [facility] to provide your oncology and hematology care.   If you have a lab appointment with the [facility], please go directly to the [facility] and check in at the registration area.   Wear comfortable clothing and clothing appropriate for easy access to any Portacath or PICC line.   We strive to give you quality time with your provider. You may need to reschedule your appointment if you arrive late (15 or more minutes).  Arriving late affects you and other patients whose appointments are after yours.  Also, if you miss three or more appointments without notifying the office, you may be dismissed from the clinic at the provider's discretion.      For prescription refill requests, have your pharmacy contact our office and allow 72 hours for refills to be completed.    Today you received the following chemotherapy and/or immunotherapy agents trastuzumab, pertuzumab, docetaxel, carboplatin     To help prevent nausea and vomiting after your treatment, we encourage you to take your nausea medication as directed.  BELOW ARE SYMPTOMS THAT SHOULD BE REPORTED IMMEDIATELY: *FEVER GREATER THAN 100.4 F (38 C) OR HIGHER *CHILLS OR SWEATING *NAUSEA AND VOMITING THAT IS NOT CONTROLLED WITH YOUR NAUSEA MEDICATION *UNUSUAL SHORTNESS OF BREATH *UNUSUAL BRUISING OR BLEEDING *URINARY PROBLEMS (pain or burning when urinating, or frequent urination) *BOWEL PROBLEMS (unusual diarrhea, constipation, pain near the anus) TENDERNESS IN MOUTH AND THROAT WITH OR WITHOUT PRESENCE OF ULCERS (sore throat, sores in mouth, or a toothache) UNUSUAL RASH, SWELLING OR PAIN  UNUSUAL VAGINAL DISCHARGE OR ITCHING   Items with * indicate a potential emergency and should be followed up as soon as possible or go to the Emergency Department if any problems should occur.  Please show the CHEMOTHERAPY ALERT CARD or  IMMUNOTHERAPY ALERT CARD at check-in to the Emergency Department and triage nurse.  Should you have questions after your visit or need to cancel or reschedule your appointment, please contact [facility]  Dept: [phone]  and follow the prompts.  Office hours are 8:00 a.m. to 4:30 p.m. Monday - Friday. Please note that voicemails left after 4:00 p.m. may not be returned until the following business day.  We are closed weekends and major holidays. You have access to a nurse at all times for urgent questions. Please call the main number to the clinic Dept: [phone] and follow the prompts.   For any non-urgent questions, you may also contact your provider using MyChart. We now offer e-Visits for anyone 74 and older to request care online for non-urgent symptoms. For details visit [email or web address].   Also download the MyChart app! Go to the app store, search "MyChart", open the app, select [facility], and log in with your MyChart username and password.  Due to Covid, a mask is required upon entering the hospital/clinic. If you do not have a mask, one will be given to you upon arrival. For doctor visits, patients may have 1 support person aged 39 or older with them. For treatment visits, patients cannot have anyone with them due to current Covid guidelines and our immunocompromised population.

## 2021-05-30 NOTE — Progress Notes (Signed)
[facility] CSW Progress Note  Holiday representative met with patient to provide final set of [employer] cards today. Regarding other applications, pt stated that she had scanned and sent one to this CSW, but it was not received. Pt will try to send again.     Christeen Douglas , LCSW

## 2021-05-31 ENCOUNTER — Ambulatory Visit: Payer: No Typology Code available for payment source

## 2021-06-01 ENCOUNTER — Other Ambulatory Visit: Payer: Self-pay

## 2021-06-01 ENCOUNTER — Inpatient Hospital Stay: Payer: No Typology Code available for payment source

## 2021-06-01 DIAGNOSIS — Z5111 Encounter for antineoplastic chemotherapy: Secondary | ICD-10-CM | POA: Diagnosis not present

## 2021-06-01 DIAGNOSIS — Z170 Estrogen receptor positive status [ER+]: Secondary | ICD-10-CM

## 2021-06-01 MED ORDER — PEGFILGRASTIM-BMEZ 6 MG/0.6ML ~~LOC~~ SOSY
6.0000 mg | PREFILLED_SYRINGE | Freq: Once | SUBCUTANEOUS | Status: AC
  Administered 2021-06-01: 6 mg via SUBCUTANEOUS

## 2021-06-01 MED ORDER — PEGFILGRASTIM-BMEZ 6 MG/0.6ML ~~LOC~~ SOSY
PREFILLED_SYRINGE | SUBCUTANEOUS | Status: AC
  Filled 2021-06-01: qty 0.6

## 2021-06-02 ENCOUNTER — Other Ambulatory Visit: Payer: Self-pay | Admitting: Cardiology

## 2021-06-02 DIAGNOSIS — R002 Palpitations: Secondary | ICD-10-CM

## 2021-06-02 DIAGNOSIS — R000 Tachycardia, unspecified: Secondary | ICD-10-CM

## 2021-06-18 NOTE — Progress Notes (Signed)
Diane Miranda  [phone]) [phone] [phone]) [phone]     ID: Diane Miranda DOB: [birth year]  MR#: [id number]  YCX#:[id number]  Patient Care Team: [name], MD as PCP - General (Internal Medicine) Mauro Kaufmann, RN as Oncology Nurse Navigator Rockwell Germany, RN as Oncology Nurse Navigator Stark Klein, MD as Consulting Physician (General Surgery) Jamae Tison, Virgie Dad, MD as Consulting Physician (Oncology) Gery Pray, MD as Consulting Physician (Radiation Oncology) Wylene Simmer, MD as Consulting Physician (Orthopedic Surgery) Chauncey Cruel, MD OTHER MD:  CHIEF COMPLAINT: triple positive breast cancer  CURRENT TREATMENT: Neoadjuvant chemotherapy   INTERVAL HISTORY: Diane Miranda returns today for follow up and treatment of her triple positive breast cancer.  Recall she began neoadjuvant chemotherapy, consisting of carboplatin, docetaxel, trastuzumab and pertuzumab every 21 days x 6, on 03/28/2021.  Today is day 1 cycle 5.  She is scheduled for restaging breast MRI on 06/21/21, which is too early.  We are changing that to after she completes her chemotherapy   REVIEW OF SYSTEMS: Diane Miranda continues to do generally quite well with her treatment.  She does feel a little bit tired for several days after each cycle.  She is also now having hot flashes which wake her up at night.  She is having vaginal dryness issues and this is something she is discussing with her husband.  For her birthday last week they made a "fish boil, which sounds like a wonderful gumbo that she prepared and for the holidays she made all sorts of kebabs for her family.  She says everybody likes to come visit because they like her cooking.  She has a little bit of numbness in her left hand sometimes in the morning right after waking up but not for the rest of the day and not elsewhere.  Detailed review of systems today was otherwise stable   COVID 19 VACCINATION STATUS: refuses  vaccination; infection 10/2019   HISTORY OF CURRENT ILLNESS: From the original intake note:  Diane Miranda herself palpated a right breast mass and noted associated pain. She underwent bilateral diagnostic mammography with tomography and right breast ultrasonography at [facility] on 02/15/2021 showing: breast density category B; palpable 5.4 cm mass within outer right breast; two abnormal right axillary lymph nodes; 1.8 cm indeterminate hypoechoic area within upper-outer right breast; no evidence of left breast malignancy.  Accordingly on 02/23/2021 she proceeded to biopsy of the right breast areas in question. The pathology from this procedure ([id number]) showed:  1. Right Breast, 11 o'clock  - adenosis and columnar cell hyperplasia; this was concordant 2. Right Breast, 9 o'clock  - invasive ductal carcinoma, grade 3  - Prognostic indicators significant for: estrogen receptor, 40% positive with moderate staining intensity and progesterone receptor, 60% positive with strong staining intensity. Proliferation marker Ki67 at 35%. HER2 positive by immunohistochemistry (3+). 3. Right Axilla, lymph node  - invasive ductal carcinoma involving nodal tissue  She underwent breast MRI on 03/04/2021 showing: breast composition B; 4.7 cm mass in 8-9 o'clock position of posterior right breast represents recently diagnosed malignancy; two adjacent abnormal right axillary lymph nodes, one of which represents the biopsy-proven metastatic node; significant skin thickening in periareolar right breast, with significant increased T2 signal consistent with edema; mild abnormal enhancement within thickened skin, nonspecific.  There was also a 1.1 cm nonspecific enhancing mass in left breast at 2-3 o'clock.  Cancer Staging Malignant neoplasm of upper-outer quadrant of right breast in female, estrogen receptor positive ([place]) Staging  form: Breast, AJCC 8th Edition - Clinical: Stage IIB (cT3, cN1, cM0, G3,  ER+, PR+, HER2+) - Signed by Chauncey Cruel, MD on 03/07/2021 Histologic grading system: 3 grade system  The patient's subsequent history is as detailed below.   PAST MEDICAL HISTORY: Past Medical History:  Diagnosis Date   Allergy 2000   Asthma    as child   Breast cancer ([place])    Family history of lung cancer 03/08/2021   Family history of uterine cancer 03/08/2021   GERD (gastroesophageal reflux disease)    Headache    History of COVID-19    Hyperlipidemia    Hypertension    Prediabetes    right breast ca dx'd 03-21-21   Trimalleolar fracture of ankle, closed, left, initial encounter     PAST SURGICAL HISTORY: Past Surgical History:  Procedure Laterality Date   BREAST BIOPSY Right 03/22/2021   Procedure: PUNCH BIOPSY OF RIGHT AREOLA;  Surgeon: Stark Klein, MD;  Location: [facility];  Service: General;  Laterality: Right;   FRACTURE SURGERY  2018   NO PAST SURGERIES     ORIF ANKLE FRACTURE Left 07/10/2017   Procedure: OPEN REDUCTION INTERNAL FIXATION (ORIF) trimallolar ANKLE FRACTURE;  Surgeon: Wylene Simmer, MD;  Location: [facility];  Service: Orthopedics;  Laterality: Left;   PORTACATH PLACEMENT Left 03/22/2021   Procedure: INSERTION PORT-A-CATH;  Surgeon: Stark Klein, MD;  Location: [facility];  Service: General;  Laterality: Left;    FAMILY HISTORY: Family History  Problem Relation Age of Onset   Diabetes Father    Hypertension Father    Early death Mother    Heart disease Mother    Hypertension Mother    Hearing loss Maternal Grandmother    Hypertension Maternal Grandmother    Uterine cancer Other 2       MGM's sister   Throat cancer Other        MGM's brother; dx after 53   Lung cancer Other        PGM's sister; dx 69s   Diabetes Brother    Her father is living at age 50 as of 03-21-21. Her mother died at age 12 from [place]. Diane Miranda has two brothers (and no sisters). She reports endometrial cancer in a maternal  great aunt and lung cancer in a paternal aunt. There is no other family history of cancer to her knowledge.   GYNECOLOGIC HISTORY:  No LMP recorded. Menarche: 45 years old Age at first live birth: 45 years old [place] P 2 LMP early 03/21/21 (has since had IUD placed) Contraceptive: IUD in place [place]), previously used pills from 1996-2006 HRT n/a  Hysterectomy? no BSO? 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 She is single. She lives at home with daughter Joylene Draft (age 4), adopted daughter Hetty Blend (age 56), and the patient's maternal grandmother, who has significant dementia issues. Daughter Levy Pupa, age 18, works in Press photographer in [place]. 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    ADVANCED DIRECTIVES: not in place; however the patient made it clear during her 03/07/2021 visit that she would not accept blood product transfusions for any reason including immediately life saving reasons   HEALTH MAINTENANCE: Social History   Tobacco Use   Smoking status: Never   Smokeless tobacco: Never  Vaping Use   Vaping Use: Never used  Substance Use Topics   Alcohol use: Not Currently    Alcohol/week:  0.0 standard drinks    Comment: social   Drug use: No     Colonoscopy: n/a (age)  PAP: 2020  Bone density: n/a (age)   Allergies  Allergen Reactions   Hydrochlorothiazide Anaphylaxis and Swelling    Current Outpatient Medications  Medication Sig Dispense Refill   gabapentin (NEURONTIN) 300 MG capsule Take 1 capsule (300 mg total) by mouth at bedtime. 90 capsule 4   acetaminophen (TYLENOL) 500 MG tablet Take 1 tablet (500 mg total) by mouth 3 (three) times daily with meals as needed. Take with aleve 220 mg tablet 90 tablet 0   carvedilol (COREG) 6.25 MG tablet Take 1 tablet (6.25 mg total) by mouth 2 (two) times daily. 180 tablet 3   dexamethasone (DECADRON) 4 MG tablet Take 2 tablets (8 mg total) by mouth 2  (two) times daily. Start the day before Taxotere. Then take daily x 3 days after chemotherapy. 30 tablet 1   Fexofenadine HCl (ALLEGRA ALLERGY PO) Take by mouth.     lidocaine-prilocaine (EMLA) cream Apply to affected area once 30 g 3   lisinopril (ZESTRIL) 20 MG tablet Take 1 tablet (20 mg total) by mouth every morning. 90 tablet 1   loratadine (CLARITIN) 10 MG tablet Take 1 tablet (10 mg total) by mouth daily. (Patient taking differently: Take 10 mg by mouth daily as needed.) 90 tablet 4   Multiple Vitamins-Minerals (MULTIVITAMIN WITH MINERALS) tablet Take 1 tablet by mouth daily.     naproxen sodium (ALEVE) 220 MG tablet Take 220 mg by mouth.     ondansetron (ZOFRAN) 8 MG tablet Take 1 tablet (8 mg total) by mouth 2 (two) times daily as needed (Nausea or vomiting). Start on the third day after chemotherapy. 30 tablet 1   pantoprazole (PROTONIX) 20 MG tablet Take 20 mg by mouth daily.     prochlorperazine (COMPAZINE) 10 MG tablet Take 1 tablet (10 mg total) by mouth every 6 (six) hours as needed (Nausea or vomiting). 30 tablet 1   rosuvastatin (CRESTOR) 10 MG tablet Take 10 mg by mouth daily.     tobramycin-dexamethasone (TOBRADEX) ophthalmic solution Place 1 drop into both eyes in the morning and at bedtime. 5 mL 2   Turmeric (QC TUMERIC COMPLEX PO) Take by mouth.     valACYclovir (VALTREX) 1000 MG tablet Take 1 tablet (1,000 mg total) by mouth daily. 60 tablet 1   No current facility-administered medications for this visit.    OBJECTIVE: African-American woman in no acute distress  Vitals:   06/19/21 1111  BP: (!) 147/85  Pulse: (!) 113  Resp: 18  Temp: (!) 97.5 F (36.4 C)  SpO2: 100%     Body mass index is 39.72 kg/m.   Wt Readings from Last 3 Encounters:  06/19/21 231 lb 6 oz (105 kg)  05/30/21 231 lb 6.4 oz (105 kg)  05/24/21 230 lb (104.3 kg)      ECOG FS:1 - Symptomatic but completely ambulatory  Sclerae unicteric, EOMs intact Wearing a mask No cervical or  supraclavicular adenopathy Lungs no rales or rhonchi Heart regular rate and rhythm Abd soft, nontender, positive bowel sounds MSK no focal spinal tenderness, no upper extremity lymphedema Neuro: nonfocal, well oriented, appropriate affect Breasts: I do not palpate a mass in either breast.  Both axillae are benign.    LAB RESULTS:  CMP     Component Value Date/Time   NA 140 05/30/2021 1151   K 4.0 05/30/2021 1151   CL 108 05/30/2021 1151  CO2 23 05/30/2021 1151   GLUCOSE 133 (H) 05/30/2021 1151   GLUCOSE 105 (H) 11/12/2006 1022   BUN 8 05/30/2021 1151   CREATININE 0.80 05/30/2021 1151   CREATININE 0.92 03/07/2021 1219   CALCIUM 9.4 05/30/2021 1151   PROT 6.6 05/30/2021 1151   ALBUMIN 3.5 05/30/2021 1151   AST 14 (L) 05/30/2021 1151   AST 13 (L) 03/07/2021 1219   ALT 19 05/30/2021 1151   ALT 16 03/07/2021 1219   ALKPHOS 66 05/30/2021 1151   BILITOT 0.5 05/30/2021 1151   BILITOT 0.6 03/07/2021 1219   GFRNONAA >60 05/30/2021 1151   GFRNONAA >60 03/07/2021 1219   GFRAA >90 01/30/2015 1319    No results found for: TOTALPROTELP, ALBUMINELP, A1GS, A2GS, BETS, BETA2SER, GAMS, MSPIKE, SPEI  Lab Results  Component Value Date   WBC 9.1 06/19/2021   NEUTROABS 8.2 (H) 06/19/2021   HGB 10.4 (L) 06/19/2021   HCT 29.9 (L) 06/19/2021   MCV 98.4 06/19/2021   PLT 233 06/19/2021    No results found for: LABCA2  No components found for: [id number]  No results for input(s): INR in the last 168 hours.  No results found for: LABCA2  No results found for: ELF810  No results found for: FBP102  No results found for: HEN277  No results found for: CA2729  No components found for: HGQUANT  No results found for: CEA1 / No results found for: CEA1   No results found for: AFPTUMOR  No results found for: CHROMOGRNA  No results found for: KPAFRELGTCHN, LAMBDASER, KAPLAMBRATIO (kappa/lambda light chains)  No results found for: HGBA, HGBA2QUANT, HGBFQUANT,  HGBSQUAN (Hemoglobinopathy evaluation)   No results found for: LDH  No results found for: IRON, TIBC, IRONPCTSAT (Iron and TIBC)  No results found for: FERRITIN  Urinalysis    Component Value Date/Time   COLORURINE YELLOW 01/30/2015 [place] 01/30/2015 1343   LABSPEC 1.011 01/30/2015 1343   PHURINE 7.5 01/30/2015 [place] 01/30/2015 [place] 01/30/2015 [place] 01/30/2015 1343   KETONESUR NEGATIVE 01/30/2015 1343   PROTEINUR NEGATIVE 01/30/2015 1343   UROBILINOGEN 0.2 01/30/2015 1343   NITRITE NEGATIVE 01/30/2015 1343   LEUKOCYTESUR SMALL (A) 01/30/2015 1343    STUDIES: No results found.   ELIGIBLE FOR AVAILABLE RESEARCH PROTOCOL:no  ASSESSMENT: 45 y.o. [place] woman status post right breast upper outer quadrant biopsy 02/23/2021 for a clinical T3 N1, stage IIB invasive ductal carcinoma, grade 3, triple positive, with an MIB-1 of 35%  (a) right periareolar biopsy 02/23/2021 was benign/concordant  (b) left breast nodule biopsy 03/14/2021 benign  (c) Punch biopsy of the right areolar area 03/22/2021 no malignancy  (d) CT of the chest abdomen and pelvis 03/20/2021 shows no evidence of metastatic disease; there is 2.5 cm cystic pancreatic mass and a 0.4 cm right liver lobe lesion requiring further follow-up  (e) bone scan 03/25/2021 shows no bone metastases  (1) genetics testing 03/26/2021 through the  [employer]RNA[employer]ight panel offered by [facility] found no deleterious mutations in APC, ATM, AXIN2, BARD1, BMPR1A, BRCA1, BRCA2, BRIP1, CDH1, CDK4, CDKN2A, CHEK2, DICER1, EPCAM, GREM1, HOXB13, MEN1, MLH1, MSH2, MSH3, MSH6, MUTYH, NBN, NF1, NF2, NTHL1, PALB2, PMS2, POLD1, POLE, PTEN, RAD51C, RAD51D, RECQL, RET, SDHA, SDHAF2, SDHB, SDHC, SDHD, SMAD4, SMARCA4, STK11, TP53, TSC1, TSC2, and VHL.  RNA data is routinely analyzed for use in variant interpretation for all genes.   (2) neoadjuvant chemotherapy  consisting of carboplatin, docetaxel, trastuzumab and pertuzumab every  21 days x 6 beginning 03/28/2021  (3) anti-HER-2 immunotherapy to continue to total 1 year  (a) echo 03/13/2021 showed an ejection fraction in the 60- 65% range  (4) definitive surgery to follow  (5) adjuvant radiation  (6) antiestrogens   PLAN: Henryetta will proceed to her fifth cycle of chemotherapy today.  She is doing quite well with these treatments and she only has 1 more cycle to go, 3 weeks from now.  That is already scheduled.  Her breast MRI needs to be rescheduled so it will be done after July 26, most likely the first week in August.  That order has been entered.  I think she has some carpal tunnel symptoms on the left hand.  I suggested she use the wrist splint and see if that takes care of the problem.  We discussed menopausal issues.  She is not having hot flashes.  They do wake her up at night.  We are adding gabapentin which I think will be helpful.  She will only take it at bedtime.  She is having some dyspareunia and vaginal dryness.  When she is done with chemo we can refer her for the pelvic rehab program.  If she does eventually take tamoxifen we can also add vaginal estrogens  Total encounter time 25 minutes.Sarajane Jews C. Medhansh Brinkmeier, MD 06/19/2021 11:27 AM Medical Oncology and Hematology [facility] [place], [place] [address] Tel. [phone]    Fax. [phone]   This document serves as a record of services personally performed by Lurline Del, MD. It was created on his behalf by Wilburn Mylar, a trained medical scribe. The creation of this record is based on the scribe's personal observations and the provider's statements to them.   I, Lurline Del MD, have reviewed the above documentation for accuracy and completeness, and I agree with the above.   *Total Encounter Time as defined by the Centers for Medicare and Medicaid Services includes, in  addition to the face-to-face time of a patient visit (documented in the note above) non-face-to-face time: obtaining and reviewing outside history, ordering and reviewing medications, tests or procedures, care coordination (communications with other health care professionals or caregivers) and documentation in the medical record.

## 2021-06-19 ENCOUNTER — Other Ambulatory Visit: Payer: Self-pay

## 2021-06-19 ENCOUNTER — Encounter: Payer: Self-pay | Admitting: *Deleted

## 2021-06-19 ENCOUNTER — Inpatient Hospital Stay (HOSPITAL_BASED_OUTPATIENT_CLINIC_OR_DEPARTMENT_OTHER): Payer: No Typology Code available for payment source | Admitting: Oncology

## 2021-06-19 ENCOUNTER — Inpatient Hospital Stay: Payer: No Typology Code available for payment source

## 2021-06-19 ENCOUNTER — Inpatient Hospital Stay: Payer: No Typology Code available for payment source | Attending: Oncology

## 2021-06-19 VITALS — BP 147/85 | HR 113 | Temp 97.5°F | Resp 18 | Wt 231.4 lb

## 2021-06-19 VITALS — HR 72

## 2021-06-19 DIAGNOSIS — Z95828 Presence of other vascular implants and grafts: Secondary | ICD-10-CM

## 2021-06-19 DIAGNOSIS — C50411 Malignant neoplasm of upper-outer quadrant of right female breast: Secondary | ICD-10-CM | POA: Insufficient documentation

## 2021-06-19 DIAGNOSIS — Z79899 Other long term (current) drug therapy: Secondary | ICD-10-CM | POA: Insufficient documentation

## 2021-06-19 DIAGNOSIS — C773 Secondary and unspecified malignant neoplasm of axilla and upper limb lymph nodes: Secondary | ICD-10-CM | POA: Insufficient documentation

## 2021-06-19 DIAGNOSIS — Z170 Estrogen receptor positive status [ER+]: Secondary | ICD-10-CM

## 2021-06-19 DIAGNOSIS — Z5111 Encounter for antineoplastic chemotherapy: Secondary | ICD-10-CM | POA: Insufficient documentation

## 2021-06-19 LAB — CBC WITH DIFFERENTIAL/PLATELET
Abs Immature Granulocytes: 0.05 10*3/uL (ref 0.00–0.07)
Basophils Absolute: 0 10*3/uL (ref 0.0–0.1)
Basophils Relative: 0 %
Eosinophils Absolute: 0 10*3/uL (ref 0.0–0.5)
Eosinophils Relative: 0 %
HCT: 29.9 % — ABNORMAL LOW (ref 36.0–46.0)
Hemoglobin: 10.4 g/dL — ABNORMAL LOW (ref 12.0–15.0)
Immature Granulocytes: 1 %
Lymphocytes Relative: 8 %
Lymphs Abs: 0.7 10*3/uL (ref 0.7–4.0)
MCH: 34.2 pg — ABNORMAL HIGH (ref 26.0–34.0)
MCHC: 34.8 g/dL (ref 30.0–36.0)
MCV: 98.4 fL (ref 80.0–100.0)
Monocytes Absolute: 0.2 10*3/uL (ref 0.1–1.0)
Monocytes Relative: 2 %
Neutro Abs: 8.2 10*3/uL — ABNORMAL HIGH (ref 1.7–7.7)
Neutrophils Relative %: 89 %
Platelets: 233 10*3/uL (ref 150–400)
RBC: 3.04 MIL/uL — ABNORMAL LOW (ref 3.87–5.11)
RDW: 20.1 % — ABNORMAL HIGH (ref 11.5–15.5)
WBC: 9.1 10*3/uL (ref 4.0–10.5)
nRBC: 0 % (ref 0.0–0.2)

## 2021-06-19 LAB — COMPREHENSIVE METABOLIC PANEL
ALT: 24 U/L (ref 0–44)
AST: 16 U/L (ref 15–41)
Albumin: 3.4 g/dL — ABNORMAL LOW (ref 3.5–5.0)
Alkaline Phosphatase: 68 U/L (ref 38–126)
Anion gap: 12 (ref 5–15)
BUN: 11 mg/dL (ref 6–20)
CO2: 23 mmol/L (ref 22–32)
Calcium: 9 mg/dL (ref 8.9–10.3)
Chloride: 107 mmol/L (ref 98–111)
Creatinine, Ser: 0.86 mg/dL (ref 0.44–1.00)
GFR, Estimated: >60 mL/min (ref >60)
Glucose, Bld: 212 mg/dL — ABNORMAL HIGH (ref 70–99)
Potassium: 4.1 mmol/L (ref 3.5–5.1)
Sodium: 142 mmol/L (ref 135–145)
Total Bilirubin: 0.5 mg/dL (ref 0.3–1.2)
Total Protein: 6.6 g/dL (ref 6.5–8.1)

## 2021-06-19 MED ORDER — SODIUM CHLORIDE 0.9% FLUSH
10.0000 mL | INTRAVENOUS | Status: DC | PRN
  Administered 2021-06-19: 10 mL
  Filled 2021-06-19: qty 10

## 2021-06-19 MED ORDER — SODIUM CHLORIDE 0.9 % IV SOLN
Freq: Once | INTRAVENOUS | Status: AC
  Filled 2021-06-19: qty 250

## 2021-06-19 MED ORDER — SODIUM CHLORIDE 0.9% FLUSH
10.0000 mL | INTRAVENOUS | Status: AC | PRN
  Administered 2021-06-19: 10 mL
  Filled 2021-06-19: qty 10

## 2021-06-19 MED ORDER — SODIUM CHLORIDE 0.9 % IV SOLN
75.0000 mg/m2 | Freq: Once | INTRAVENOUS | Status: AC
  Administered 2021-06-19: 170 mg via INTRAVENOUS
  Filled 2021-06-19: qty 17

## 2021-06-19 MED ORDER — DIPHENHYDRAMINE HCL 25 MG PO CAPS
25.0000 mg | ORAL_CAPSULE | Freq: Once | ORAL | Status: AC
  Administered 2021-06-19: 25 mg via ORAL

## 2021-06-19 MED ORDER — ACETAMINOPHEN 325 MG PO TABS
ORAL_TABLET | ORAL | Status: AC
  Filled 2021-06-19: qty 2

## 2021-06-19 MED ORDER — GABAPENTIN 300 MG PO CAPS: 300.0000 mg | ORAL_CAPSULE | Freq: Every day | ORAL | 4 refills | Status: DC

## 2021-06-19 MED ORDER — ACETAMINOPHEN 325 MG PO TABS
650.0000 mg | ORAL_TABLET | Freq: Once | ORAL | Status: AC
  Administered 2021-06-19: 650 mg via ORAL

## 2021-06-19 MED ORDER — HEPARIN SOD (PORK) LOCK FLUSH 100 UNIT/ML IV SOLN
500.0000 [IU] | Freq: Once | INTRAVENOUS | Status: AC | PRN
  Administered 2021-06-19: 500 [IU]
  Filled 2021-06-19: qty 5

## 2021-06-19 MED ORDER — SODIUM CHLORIDE 0.9 % IV SOLN
700.0000 mg | Freq: Once | INTRAVENOUS | Status: AC
  Administered 2021-06-19: 700 mg via INTRAVENOUS
  Filled 2021-06-19: qty 70

## 2021-06-19 MED ORDER — SODIUM CHLORIDE 0.9 % IV SOLN
10.0000 mg | Freq: Once | INTRAVENOUS | Status: AC
  Administered 2021-06-19: 10 mg via INTRAVENOUS
  Filled 2021-06-19: qty 1
  Filled 2021-06-19: qty 10
  Filled 2021-06-19: qty 1

## 2021-06-19 MED ORDER — TRASTUZUMAB-ANNS CHEMO 150 MG IV SOLR
6.0000 mg/kg | Freq: Once | INTRAVENOUS | Status: AC
  Administered 2021-06-19: 651 mg via INTRAVENOUS
  Filled 2021-06-19: qty 31

## 2021-06-19 MED ORDER — PALONOSETRON HCL INJECTION 0.25 MG/5ML
0.2500 mg | Freq: Once | INTRAVENOUS | Status: AC
  Administered 2021-06-19: 0.25 mg via INTRAVENOUS

## 2021-06-19 MED ORDER — SODIUM CHLORIDE 0.9 % IV SOLN
420.0000 mg | Freq: Once | INTRAVENOUS | Status: AC
  Administered 2021-06-19: 420 mg via INTRAVENOUS
  Filled 2021-06-19: qty 14

## 2021-06-19 MED ORDER — SODIUM CHLORIDE 0.9 % IV SOLN
150.0000 mg | Freq: Once | INTRAVENOUS | Status: AC
  Administered 2021-06-19: 150 mg via INTRAVENOUS
  Filled 2021-06-19: qty 150

## 2021-06-19 MED ORDER — PALONOSETRON HCL INJECTION 0.25 MG/5ML
INTRAVENOUS | Status: AC
  Filled 2021-06-19: qty 5

## 2021-06-19 MED ORDER — DIPHENHYDRAMINE HCL 25 MG PO CAPS
ORAL_CAPSULE | ORAL | Status: AC
  Filled 2021-06-19: qty 1

## 2021-06-19 NOTE — Patient Instructions (Signed)
[facility]  Discharge Instructions: Thank you for choosing [facility] to provide your oncology and hematology care.   If you have a lab appointment with the [facility], please go directly to the [facility] and check in at the registration area.   Wear comfortable clothing and clothing appropriate for easy access to any Portacath or PICC line.   We strive to give you quality time with your provider. You may need to reschedule your appointment if you arrive late (15 or more minutes).  Arriving late affects you and other patients whose appointments are after yours.  Also, if you miss three or more appointments without notifying the office, you may be dismissed from the clinic at the provider's discretion.      For prescription refill requests, have your pharmacy contact our office and allow 72 hours for refills to be completed.    Today you received the following chemotherapy and/or immunotherapy agents trastuzumab, pertuzumab, docetaxel, carboplatin     To help prevent nausea and vomiting after your treatment, we encourage you to take your nausea medication as directed.  BELOW ARE SYMPTOMS THAT SHOULD BE REPORTED IMMEDIATELY: *FEVER GREATER THAN 100.4 F (38 C) OR HIGHER *CHILLS OR SWEATING *NAUSEA AND VOMITING THAT IS NOT CONTROLLED WITH YOUR NAUSEA MEDICATION *UNUSUAL SHORTNESS OF BREATH *UNUSUAL BRUISING OR BLEEDING *URINARY PROBLEMS (pain or burning when urinating, or frequent urination) *BOWEL PROBLEMS (unusual diarrhea, constipation, pain near the anus) TENDERNESS IN MOUTH AND THROAT WITH OR WITHOUT PRESENCE OF ULCERS (sore throat, sores in mouth, or a toothache) UNUSUAL RASH, SWELLING OR PAIN  UNUSUAL VAGINAL DISCHARGE OR ITCHING   Items with * indicate a potential emergency and should be followed up as soon as possible or go to the Emergency Department if any problems should occur.  Please show the CHEMOTHERAPY ALERT CARD or  IMMUNOTHERAPY ALERT CARD at check-in to the Emergency Department and triage nurse.  Should you have questions after your visit or need to cancel or reschedule your appointment, please contact [facility]  Dept: [phone]  and follow the prompts.  Office hours are 8:00 a.m. to 4:30 p.m. Monday - Friday. Please note that voicemails left after 4:00 p.m. may not be returned until the following business day.  We are closed weekends and major holidays. You have access to a nurse at all times for urgent questions. Please call the main number to the clinic Dept: [phone] and follow the prompts.   For any non-urgent questions, you may also contact your provider using MyChart. We now offer e-Visits for anyone 70 and older to request care online for non-urgent symptoms. For details visit [email or web address].   Also download the MyChart app! Go to the app store, search "MyChart", open the app, select [facility], and log in with your MyChart username and password.  Due to Covid, a mask is required upon entering the hospital/clinic. If you do not have a mask, one will be given to you upon arrival. For doctor visits, patients may have 1 support person aged 23 or older with them. For treatment visits, patients cannot have anyone with them due to current Covid guidelines and our immunocompromised population.

## 2021-06-21 ENCOUNTER — Other Ambulatory Visit: Payer: Self-pay

## 2021-06-21 ENCOUNTER — Inpatient Hospital Stay: Payer: No Typology Code available for payment source

## 2021-06-21 ENCOUNTER — Other Ambulatory Visit: Payer: No Typology Code available for payment source

## 2021-06-21 VITALS — BP 147/78 | HR 78 | Temp 98.6°F | Resp 18

## 2021-06-21 DIAGNOSIS — Z170 Estrogen receptor positive status [ER+]: Secondary | ICD-10-CM

## 2021-06-21 DIAGNOSIS — Z5111 Encounter for antineoplastic chemotherapy: Secondary | ICD-10-CM | POA: Diagnosis not present

## 2021-06-21 DIAGNOSIS — C50411 Malignant neoplasm of upper-outer quadrant of right female breast: Secondary | ICD-10-CM

## 2021-06-21 MED ORDER — PEGFILGRASTIM-BMEZ 6 MG/0.6ML ~~LOC~~ SOSY
PREFILLED_SYRINGE | SUBCUTANEOUS | Status: AC
  Filled 2021-06-21: qty 0.6

## 2021-06-21 MED ORDER — PEGFILGRASTIM-BMEZ 6 MG/0.6ML ~~LOC~~ SOSY
6.0000 mg | PREFILLED_SYRINGE | Freq: Once | SUBCUTANEOUS | Status: AC
  Administered 2021-06-21: 6 mg via SUBCUTANEOUS

## 2021-06-22 ENCOUNTER — Encounter: Payer: Self-pay | Admitting: *Deleted

## 2021-06-27 ENCOUNTER — Ambulatory Visit (HOSPITAL_COMMUNITY)
Admission: RE | Admit: 2021-06-27 | Discharge: 2021-06-27 | Disposition: A | Discharge status: Home / Self Care | Payer: No Typology Code available for payment source | Source: Ambulatory Visit | Attending: Oncology | Admitting: Oncology

## 2021-06-27 ENCOUNTER — Other Ambulatory Visit: Payer: Self-pay

## 2021-06-27 DIAGNOSIS — Z0189 Encounter for other specified special examinations: Secondary | ICD-10-CM

## 2021-06-27 DIAGNOSIS — C50411 Malignant neoplasm of upper-outer quadrant of right female breast: Secondary | ICD-10-CM | POA: Insufficient documentation

## 2021-06-27 DIAGNOSIS — I10 Essential (primary) hypertension: Secondary | ICD-10-CM | POA: Diagnosis not present

## 2021-06-27 DIAGNOSIS — Z170 Estrogen receptor positive status [ER+]: Secondary | ICD-10-CM | POA: Diagnosis present

## 2021-06-27 LAB — ECHOCARDIOGRAM COMPLETE
AR max vel: 2.64 cm2
AV Area VTI: 2.41 cm2
AV Area mean vel: 2.69 cm2
AV Mean grad: 5 mmHg
AV Peak grad: 9.9 mmHg
Ao pk vel: 1.57 m/s
Area-P 1/2: 5.02 cm2
S' Lateral: 2.5 cm

## 2021-07-09 ENCOUNTER — Other Ambulatory Visit: Payer: Self-pay | Admitting: Oncology

## 2021-07-09 NOTE — Progress Notes (Signed)
[place]  [phone] [phone]     ID: Diane Miranda DOB: 1976/10/18  MR#: [id number]  MAU#:[id number]  Patient Care Team: [name], MD as PCP - General (Internal Medicine) Mauro Kaufmann, RN as Oncology Nurse Navigator Rockwell Germany, RN as Oncology Nurse Navigator Stark Klein, MD as Consulting Physician (General Surgery) Serah Nicoletti, Virgie Dad, MD as Consulting Physician (Oncology) Gery Pray, MD as Consulting Physician (Radiation Oncology) Wylene Simmer, MD as Consulting Physician (Orthopedic Surgery) Chauncey Cruel, MD OTHER MD:  CHIEF COMPLAINT: triple positive breast cancer  CURRENT TREATMENT: completing neoadjuvant chemotherapy   INTERVAL HISTORY: Diane Miranda returns today for follow up and treatment of her triple positive breast cancer.  Recall she began neoadjuvant chemotherapy, consisting of carboplatin, docetaxel, trastuzumab and pertuzumab every 21 days x 6, on 03/28/2021.  Today is day 1 cycle 6, her final cycle.  This will be followed by anti-HER2 treatment every 21 days to complete a year.  Since her last visit, she underwent repeat echocardiogram on 06/27/2021 showing an ejection fraction of 65-70%.  She is scheduled for post-treatment breast MRI on 07/19/2021.  She will see Dr. Barry Dienes 07/27/2021 to discuss final surgicalplans   REVIEW OF SYSTEMS: Diane Miranda has done quite well with her treatment overall.  She does have occasional diarrhea but Imodium takes care of it.  When she gets the chemo she has about 10 days which she does not feel quite well although she continues to do as much as she can.  For the next week and a half she feels fine and does her normal issues.  Importantly she has had no peripheral neuropathy symptoms.  She does have a slight numbness in her toes sometimes.  This happens 2 or 3 times a week.  It is not persistent.  She has no peripheral neuropathy symptoms this morning.  A detailed review  of systems today was otherwise stable   COVID 19 VACCINATION STATUS: refuses vaccination; infection 10/2019   HISTORY OF CURRENT ILLNESS: From the original intake note:  Diane Miranda herself palpated a right breast mass and noted associated pain. She underwent bilateral diagnostic mammography with tomography and right breast ultrasonography at [facility] on 02/15/2021 showing: breast density category B; palpable 5.4 cm mass within outer right breast; two abnormal right axillary lymph nodes; 1.8 cm indeterminate hypoechoic area within upper-outer right breast; no evidence of left breast malignancy.  Accordingly on 02/23/2021 she proceeded to biopsy of the right breast areas in question. The pathology from this procedure ([id number]) showed:  1. Right Breast, 11 o'clock  - adenosis and columnar cell hyperplasia; this was concordant 2. Right Breast, 9 o'clock  - invasive ductal carcinoma, grade 3  - Prognostic indicators significant for: estrogen receptor, 40% positive with moderate staining intensity and progesterone receptor, 60% positive with strong staining intensity. Proliferation marker Ki67 at 35%. HER2 positive by immunohistochemistry (3+). 3. Right Axilla, lymph node  - invasive ductal carcinoma involving nodal tissue  She underwent breast MRI on 03/04/2021 showing: breast composition B; 4.7 cm mass in 8-9 o'clock position of posterior right breast represents recently diagnosed malignancy; two adjacent abnormal right axillary lymph nodes, one of which represents the biopsy-proven metastatic node; significant skin thickening in periareolar right breast, with significant increased T2 signal consistent with edema; mild abnormal enhancement within thickened skin, nonspecific.  There was also a 1.1 cm nonspecific enhancing mass in left breast at 2-3 o'clock.  Cancer Staging Malignant neoplasm of upper-outer quadrant of  right breast in female, estrogen receptor positive  (Diane Miranda) Staging form: Breast, AJCC 8th Edition - Clinical: Stage IIB (cT3, cN1, cM0, G3, ER+, PR+, HER2+) - Signed by Chauncey Cruel, MD on 03/07/2021 Histologic grading system: 3 grade system  The patient's subsequent history is as detailed below.   PAST MEDICAL HISTORY: Past Medical History:  Diagnosis Date   Allergy 2000   Asthma    as child   Breast cancer ([place])    Family history of lung cancer 03/08/2021   Family history of uterine cancer 03/08/2021   GERD (gastroesophageal reflux disease)    Headache    History of COVID-19    Hyperlipidemia    Hypertension    Prediabetes    right breast ca dx'd 2021/03/31   Trimalleolar fracture of ankle, closed, left, initial encounter     PAST SURGICAL HISTORY: Past Surgical History:  Procedure Laterality Date   BREAST BIOPSY Right 03/22/2021   Procedure: PUNCH BIOPSY OF RIGHT AREOLA;  Surgeon: Stark Klein, MD;  Location: [facility];  Service: General;  Laterality: Right;   FRACTURE SURGERY  2018   NO PAST SURGERIES     ORIF ANKLE FRACTURE Left 07/10/2017   Procedure: OPEN REDUCTION INTERNAL FIXATION (ORIF) trimallolar ANKLE FRACTURE;  Surgeon: Wylene Simmer, MD;  Location: [facility];  Service: Orthopedics;  Laterality: Left;   PORTACATH PLACEMENT Left 03/22/2021   Procedure: INSERTION PORT-A-CATH;  Surgeon: Stark Klein, MD;  Location: [facility];  Service: General;  Laterality: Left;    FAMILY HISTORY: Family History  Problem Relation Age of Onset   Diabetes Father    Hypertension Father    Early death Mother    Heart disease Mother    Hypertension Mother    Hearing loss Maternal Grandmother    Hypertension Maternal Grandmother    Uterine cancer Other 21       MGM's sister   Throat cancer Other        MGM's brother; dx after 54   Lung cancer Other        PGM's sister; dx 95s   Diabetes Brother    Her father is living at age 15 as of 03-31-21. Her mother died at age 73 from [place].  Diane Miranda has two brothers (and no sisters). She reports endometrial cancer in a maternal great aunt and lung cancer in a paternal aunt. There is no other family history of cancer to her knowledge.   GYNECOLOGIC HISTORY:  No LMP recorded. Menarche: 45 years old Age at first live birth: 45 years old [place] P 2 LMP early 2021-03-31 (has since had IUD placed) Contraceptive: IUD in place [place]), previously used pills from 1996-2006 HRT n/a  Hysterectomy? no BSO? 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 She is single. She lives at home with daughter Diane Miranda (age 52), adopted daughter Diane Miranda (age 52), and the patient's maternal grandmother, who has significant dementia issues. Daughter Diane Miranda, age 57, works in Press photographer in [place]. Lakeria is a Restaurant manager, fast food.    ADVANCED DIRECTIVES: not in place; however the patient made it clear during her 03/07/2021 visit that she would not accept blood product transfusions for any reason including immediately life saving reasons   HEALTH MAINTENANCE: Social History   Tobacco Use   Smoking status: Never   Smokeless tobacco: Never  Vaping Use   Vaping Use: Never used  Substance Use Topics  Alcohol use: Not Currently    Alcohol/week: 0.0 standard drinks    Comment: social   Drug use: No     Colonoscopy: n/a (age)  PAP: 2020  Bone density: n/a (age)   Allergies  Allergen Reactions   Hydrochlorothiazide Anaphylaxis and Swelling    Current Outpatient Medications  Medication Sig Dispense Refill   acetaminophen (TYLENOL) 500 MG tablet Take 1 tablet (500 mg total) by mouth 3 (three) times daily with meals as needed. Take with aleve 220 mg tablet 90 tablet 0   carvedilol (COREG) 6.25 MG tablet Take 1 tablet (6.25 mg total) by mouth 2 (two) times daily. 180 tablet 3   dexamethasone (DECADRON) 4 MG tablet Take 2 tablets (8 mg total) by mouth 2 (two) times daily.  Start the day before Taxotere. Then take daily x 3 days after chemotherapy. 30 tablet 1   Fexofenadine HCl (ALLEGRA ALLERGY PO) Take by mouth.     gabapentin (NEURONTIN) 300 MG capsule Take 1 capsule (300 mg total) by mouth at bedtime. 90 capsule 4   lidocaine-prilocaine (EMLA) cream Apply to affected area once 30 g 3   lisinopril (ZESTRIL) 20 MG tablet Take 1 tablet (20 mg total) by mouth every morning. 90 tablet 1   loratadine (CLARITIN) 10 MG tablet Take 1 tablet (10 mg total) by mouth daily. (Patient taking differently: Take 10 mg by mouth daily as needed.) 90 tablet 4   Multiple Vitamins-Minerals (MULTIVITAMIN WITH MINERALS) tablet Take 1 tablet by mouth daily.     naproxen sodium (ALEVE) 220 MG tablet Take 220 mg by mouth.     ondansetron (ZOFRAN) 8 MG tablet Take 1 tablet (8 mg total) by mouth 2 (two) times daily as needed (Nausea or vomiting). Start on the third day after chemotherapy. 30 tablet 1   pantoprazole (PROTONIX) 20 MG tablet Take 20 mg by mouth daily.     prochlorperazine (COMPAZINE) 10 MG tablet Take 1 tablet (10 mg total) by mouth every 6 (six) hours as needed (Nausea or vomiting). 30 tablet 1   rosuvastatin (CRESTOR) 10 MG tablet Take 10 mg by mouth daily.     tobramycin-dexamethasone (TOBRADEX) ophthalmic solution Place 1 drop into both eyes in the morning and at bedtime. 5 mL 2   Turmeric (QC TUMERIC COMPLEX PO) Take by mouth.     valACYclovir (VALTREX) 1000 MG tablet TAKE 1 TABLET BY MOUTH EVERY DAY 90 tablet 1   No current facility-administered medications for this visit.    OBJECTIVE: African-American woman who appears stated age  20:   07/10/21 1037  BP: 135/78  Pulse: (!) 114  Resp: 20  Temp: 97.7 F (36.5 C)  SpO2: 100%     Body mass index is 38.98 kg/m.   Wt Readings from Last 3 Encounters:  07/10/21 227 lb 1.6 oz (103 kg)  06/19/21 231 lb 6 oz (105 kg)  05/30/21 231 lb 6.4 oz (105 kg)     ECOG FS:1 - Symptomatic but completely  ambulatory  Sclerae unicteric, EOMs intact Wearing a mask No cervical or supraclavicular adenopathy Lungs no rales or rhonchi Heart regular rate and rhythm Abd soft, nontender, positive bowel sounds MSK no focal spinal tenderness, no upper extremity lymphedema Neuro: nonfocal, well oriented, appropriate affect Breasts: Thorough examination of the right breast finds no palpable mass and no skin or nipple changes of concern.  Left breast and both axillae are benign   LAB RESULTS:  CMP     Component Value Date/Time  NA 142 06/19/2021 1055   K 4.1 06/19/2021 1055   CL 107 06/19/2021 1055   CO2 23 06/19/2021 1055   GLUCOSE 212 (H) 06/19/2021 1055   GLUCOSE 105 (H) 11/12/2006 1022   BUN 11 06/19/2021 1055   CREATININE 0.86 06/19/2021 1055   CREATININE 0.92 03/07/2021 1219   CALCIUM 9.0 06/19/2021 1055   PROT 6.6 06/19/2021 1055   ALBUMIN 3.4 (L) 06/19/2021 1055   AST 16 06/19/2021 1055   AST 13 (L) 03/07/2021 1219   ALT 24 06/19/2021 1055   ALT 16 03/07/2021 1219   ALKPHOS 68 06/19/2021 1055   BILITOT 0.5 06/19/2021 1055   BILITOT 0.6 03/07/2021 1219   GFRNONAA >60 06/19/2021 1055   GFRNONAA >60 03/07/2021 1219   GFRAA >90 01/30/2015 1319    No results found for: TOTALPROTELP, ALBUMINELP, A1GS, A2GS, BETS, BETA2SER, GAMS, MSPIKE, SPEI  Lab Results  Component Value Date   WBC 7.2 07/10/2021   NEUTROABS 6.3 07/10/2021   HGB 10.3 (L) 07/10/2021   HCT 29.8 (L) 07/10/2021   MCV 103.1 (H) 07/10/2021   PLT 269 07/10/2021    No results found for: LABCA2  No components found for: ZHYQMV784  No results for input(s): INR in the last 168 hours.  No results found for: LABCA2  No results found for: ONG295  No results found for: MWU132  No results found for: GMW102  No results found for: CA2729  No components found for: HGQUANT  No results found for: CEA1 / No results found for: CEA1   No results found for: AFPTUMOR  No results found for: CHROMOGRNA  No  results found for: KPAFRELGTCHN, LAMBDASER, KAPLAMBRATIO (kappa/lambda light chains)  No results found for: HGBA, HGBA2QUANT, HGBFQUANT, HGBSQUAN (Hemoglobinopathy evaluation)   No results found for: LDH  No results found for: IRON, TIBC, IRONPCTSAT (Iron and TIBC)  No results found for: FERRITIN  Urinalysis    Component Value Date/Time   COLORURINE YELLOW 01/30/2015 [place] 01/30/2015 1343   LABSPEC 1.011 01/30/2015 1343   PHURINE 7.5 01/30/2015 1343   GLUCOSEU NEGATIVE 01/30/2015 1343   HGBUR NEGATIVE 01/30/2015 1343   BILIRUBINUR NEGATIVE 01/30/2015 1343   KETONESUR NEGATIVE 01/30/2015 1343   PROTEINUR NEGATIVE 01/30/2015 1343   UROBILINOGEN 0.2 01/30/2015 1343   NITRITE NEGATIVE 01/30/2015 1343   LEUKOCYTESUR SMALL (A) 01/30/2015 1343    STUDIES: ECHOCARDIOGRAM COMPLETE  Result Date: 06/27/2021    ECHOCARDIOGRAM REPORT   Patient Name:   JEROLINE WOLBERT Hightower Date of Exam: 06/27/2021 Medical Rec #:  [id number]           Height:       64.0 in Accession #:    [id number]          Weight:       231.4 lb Date of Birth:  10-10-1976           BSA:          2.081 m Patient Age:    45 years            BP:           112/72 mmHg Patient Gender: F                   HR:           120 bpm. Exam Location:  Outpatient Procedure: 2D Echo, Cardiac Doppler and Color Doppler Indications:    Chemo  History:        Patient has  prior history of Echocardiogram examinations, most                 recent 03/13/2021. Risk Factors:Hypertension.  Sonographer:    Cammy Brochure Referring Phys: [id number] Chauncey Cruel  Sonographer Comments: Image acquisition challenging due to patient body habitus. IMPRESSIONS  1. Left ventricular ejection fraction, by estimation, is 65 to 70%. The left ventricle has hyperdynamic function. The left ventricle has no regional wall motion abnormalities. There is mild left ventricular hypertrophy. Left ventricular diastolic parameters are consistent with Grade I  diastolic dysfunction (impaired relaxation). The average left ventricular global longitudinal strain is -24.6 %. The global longitudinal strain is normal.  2. Right ventricular systolic function is normal. The right ventricular size is normal. Tricuspid regurgitation signal is inadequate for assessing PA pressure.  3. The mitral valve is normal in structure. No evidence of mitral valve regurgitation. No evidence of mitral stenosis.  4. The aortic valve is tricuspid. Aortic valve regurgitation is not visualized. No aortic stenosis is present.  5. The inferior vena cava is normal in size with greater than 50% respiratory variability, suggesting right atrial pressure of 3 mmHg. FINDINGS  Left Ventricle: Left ventricular ejection fraction, by estimation, is 65 to 70%. The left ventricle has hyperdynamic function. The left ventricle has no regional wall motion abnormalities. The average left ventricular global longitudinal strain is -24.6  %. The global longitudinal strain is normal. The left ventricular internal cavity size was normal in size. There is mild left ventricular hypertrophy. Left ventricular diastolic parameters are consistent with Grade I diastolic dysfunction (impaired relaxation). Right Ventricle: The right ventricular size is normal. No increase in right ventricular wall thickness. Right ventricular systolic function is normal. Tricuspid regurgitation signal is inadequate for assessing PA pressure. Left Atrium: Left atrial size was normal in size. Right Atrium: Right atrial size was normal in size. Pericardium: There is no evidence of pericardial effusion. Mitral Valve: The mitral valve is normal in structure. No evidence of mitral valve regurgitation. No evidence of mitral valve stenosis. Tricuspid Valve: The tricuspid valve is normal in structure. Tricuspid valve regurgitation is not demonstrated. Aortic Valve: The aortic valve is tricuspid. Aortic valve regurgitation is not visualized. No aortic  stenosis is present. Aortic valve mean gradient measures 5.0 mmHg. Aortic valve peak gradient measures 9.9 mmHg. Aortic valve area, by VTI measures 2.41 cm. Pulmonic Valve: The pulmonic valve was normal in structure. Pulmonic valve regurgitation is not visualized. Aorta: The aortic root is normal in size and structure. Venous: The inferior vena cava is normal in size with greater than 50% respiratory variability, suggesting right atrial pressure of 3 mmHg. IAS/Shunts: No atrial level shunt detected by color flow Doppler.  LEFT VENTRICLE PLAX 2D LVIDd:         3.70 cm  Diastology LVIDs:         2.50 cm  LV e' medial:    9.25 cm/s LV PW:         1.10 cm  LV E/e' medial:  8.0 LV IVS:        1.30 cm  LV e' lateral:   10.40 cm/s LVOT diam:     2.00 cm  LV E/e' lateral: 7.2 LV SV:         59 LV SV Index:   29       2D Longitudinal Strain LVOT Area:     3.14 cm 2D Strain GLS Avg:     -24.6 %  RIGHT VENTRICLE  IVC RV S prime:     14.40 cm/s  IVC diam: 0.60 cm LEFT ATRIUM             Index       RIGHT ATRIUM          Index LA diam:        2.90 cm 1.39 cm/m  RA Area:     8.85 cm LA Vol (A2C):   37.5 ml 18.02 ml/m RA Volume:   18.10 ml 8.70 ml/m LA Vol (A4C):   38.6 ml 18.55 ml/m LA Biplane Vol: 40.4 ml 19.41 ml/m  AORTIC VALVE AV Area (Vmax):    2.64 cm AV Area (Vmean):   2.69 cm AV Area (VTI):     2.41 cm AV Vmax:           157.00 cm/s AV Vmean:          103.000 cm/s AV VTI:            0.246 m AV Peak Grad:      9.9 mmHg AV Mean Grad:      5.0 mmHg LVOT Vmax:         132.00 cm/s LVOT Vmean:        88.200 cm/s LVOT VTI:          0.189 m LVOT/AV VTI ratio: 0.77  AORTA Ao Root diam: 2.70 cm Ao Asc diam:  3.20 cm MITRAL VALVE MV Area (PHT): 5.02 cm     SHUNTS MV Decel Time: 151 msec     Systemic VTI:  0.19 m MV E velocity: 74.40 cm/s   Systemic Diam: 2.00 cm MV A velocity: 113.00 cm/s MV E/A ratio:  0.66 Loralie Champagne MD Electronically signed by Loralie Champagne MD Signature Date/Time: 06/27/2021/3:04:52 PM     Final      ELIGIBLE FOR AVAILABLE RESEARCH PROTOCOL:no  ASSESSMENT: 45 y.o. [place] woman status post right breast upper outer quadrant biopsy 02/23/2021 for a clinical T3 N1, stage IIB invasive ductal carcinoma, grade 3, triple positive, with an MIB-1 of 35%  (a) right periareolar biopsy 02/23/2021 was benign/concordant  (b) left breast nodule biopsy 03/14/2021 benign  (c) Punch biopsy of the right areolar area 03/22/2021 no malignancy  (d) CT of the chest abdomen and pelvis 03/20/2021 shows no evidence of metastatic disease; there is 2.5 cm cystic pancreatic mass and a 0.4 cm right liver lobe lesion requiring further follow-up  (e) bone scan 03/25/2021 shows no bone metastases  (1) genetics testing 03/26/2021 through the  [employer]RNA[employer] panel offered by [facility] found no deleterious mutations in APC, ATM, AXIN2, BARD1, BMPR1A, BRCA1, BRCA2, BRIP1, CDH1, CDK4, CDKN2A, CHEK2, DICER1, EPCAM, GREM1, HOXB13, MEN1, MLH1, MSH2, MSH3, MSH6, MUTYH, NBN, NF1, NF2, NTHL1, PALB2, PMS2, POLD1, POLE, PTEN, RAD51C, RAD51D, RECQL, RET, SDHA, SDHAF2, SDHB, SDHC, SDHD, SMAD4, SMARCA4, STK11, TP53, TSC1, TSC2, and VHL.  RNA data is routinely analyzed for use in variant interpretation for all genes.   (2) neoadjuvant chemotherapy consisting of carboplatin, docetaxel, trastuzumab and pertuzumab every 21 days x 6 beginning 03/28/2021  (3) anti-HER-2 immunotherapy to continue to total 1 year  (a) echo 03/13/2021 showed an ejection fraction in the 60- 65% range  (4) definitive surgery to follow  (5) adjuvant radiation  (6) antiestrogens   PLAN: Raynie completes the neoadjuvant chemotherapy portion of her treatment today.  She has done quite well with this and so far we do not have any evidence of endorgan damage.  She is already scheduled for  repeat breast MRI next week.  I expect we will get good news.  She will see Dr. Barry Dienes 07/27/2021 and plan her definitive surgery at that  time.  She will return here 07/31/2021 for anti-HER2 treatment which will continue for another 9 months.  I am going to see her again on September 6 by which time she should have had her surgery and we will be able to discuss those results  Total encounter time 25 minutes.Sarajane Jews C. Caddie Randle, MD 07/10/2021 10:50 AM Medical Oncology and Hematology [facility], [place] [address] Tel. [phone]    Fax. [phone]   This document serves as a record of services personally performed by Lurline Del, MD. It was created on his behalf by Wilburn Mylar, a trained medical scribe. The creation of this record is based on the scribe's personal observations and the provider's statements to them.   I, Lurline Del MD, have reviewed the above documentation for accuracy and completeness, and I agree with the above.   *Total Encounter Time as defined by the Centers for Medicare and Medicaid Services includes, in addition to the face-to-face time of a patient visit (documented in the note above) non-face-to-face time: obtaining and reviewing outside history, ordering and reviewing medications, tests or procedures, care coordination (communications with other health care professionals or caregivers) and documentation in the medical record.

## 2021-07-10 ENCOUNTER — Inpatient Hospital Stay: Payer: No Typology Code available for payment source

## 2021-07-10 ENCOUNTER — Other Ambulatory Visit: Payer: Self-pay

## 2021-07-10 ENCOUNTER — Inpatient Hospital Stay (HOSPITAL_BASED_OUTPATIENT_CLINIC_OR_DEPARTMENT_OTHER): Payer: No Typology Code available for payment source | Admitting: Oncology

## 2021-07-10 ENCOUNTER — Encounter: Payer: Self-pay | Admitting: *Deleted

## 2021-07-10 VITALS — BP 135/78 | HR 114 | Temp 97.7°F | Resp 20 | Ht 64.0 in | Wt 227.1 lb

## 2021-07-10 VITALS — HR 99

## 2021-07-10 DIAGNOSIS — C50411 Malignant neoplasm of upper-outer quadrant of right female breast: Secondary | ICD-10-CM

## 2021-07-10 DIAGNOSIS — Z95828 Presence of other vascular implants and grafts: Secondary | ICD-10-CM

## 2021-07-10 DIAGNOSIS — Z170 Estrogen receptor positive status [ER+]: Secondary | ICD-10-CM

## 2021-07-10 DIAGNOSIS — Z5111 Encounter for antineoplastic chemotherapy: Secondary | ICD-10-CM | POA: Diagnosis not present

## 2021-07-10 LAB — CBC WITH DIFFERENTIAL/PLATELET
Abs Immature Granulocytes: 0.02 10*3/uL (ref 0.00–0.07)
Basophils Absolute: 0 10*3/uL (ref 0.0–0.1)
Basophils Relative: 0 %
Eosinophils Absolute: 0 10*3/uL (ref 0.0–0.5)
Eosinophils Relative: 0 %
HCT: 29.8 % — ABNORMAL LOW (ref 36.0–46.0)
Hemoglobin: 10.3 g/dL — ABNORMAL LOW (ref 12.0–15.0)
Immature Granulocytes: 0 %
Lymphocytes Relative: 10 %
Lymphs Abs: 0.7 10*3/uL (ref 0.7–4.0)
MCH: 35.6 pg — ABNORMAL HIGH (ref 26.0–34.0)
MCHC: 34.6 g/dL (ref 30.0–36.0)
MCV: 103.1 fL — ABNORMAL HIGH (ref 80.0–100.0)
Monocytes Absolute: 0.1 10*3/uL (ref 0.1–1.0)
Monocytes Relative: 2 %
Neutro Abs: 6.3 10*3/uL (ref 1.7–7.7)
Neutrophils Relative %: 88 %
Platelets: 269 10*3/uL (ref 150–400)
RBC: 2.89 MIL/uL — ABNORMAL LOW (ref 3.87–5.11)
RDW: 19.2 % — ABNORMAL HIGH (ref 11.5–15.5)
WBC: 7.2 10*3/uL (ref 4.0–10.5)
nRBC: 0 % (ref 0.0–0.2)

## 2021-07-10 LAB — COMPREHENSIVE METABOLIC PANEL
ALT: 28 U/L (ref 0–44)
AST: 21 U/L (ref 15–41)
Albumin: 3.5 g/dL (ref 3.5–5.0)
Alkaline Phosphatase: 58 U/L (ref 38–126)
Anion gap: 9 (ref 5–15)
BUN: 6 mg/dL (ref 6–20)
CO2: 26 mmol/L (ref 22–32)
Calcium: 9.2 mg/dL (ref 8.9–10.3)
Chloride: 109 mmol/L (ref 98–111)
Creatinine, Ser: 0.85 mg/dL (ref 0.44–1.00)
GFR, Estimated: >60 mL/min (ref >60)
Glucose, Bld: 169 mg/dL — ABNORMAL HIGH (ref 70–99)
Potassium: 3.6 mmol/L (ref 3.5–5.1)
Sodium: 144 mmol/L (ref 135–145)
Total Bilirubin: 0.4 mg/dL (ref 0.3–1.2)
Total Protein: 6.3 g/dL — ABNORMAL LOW (ref 6.5–8.1)

## 2021-07-10 MED ORDER — SODIUM CHLORIDE 0.9 % IV SOLN
10.0000 mg | Freq: Once | INTRAVENOUS | Status: AC
  Administered 2021-07-10: 10 mg via INTRAVENOUS
  Filled 2021-07-10: qty 10

## 2021-07-10 MED ORDER — PALONOSETRON HCL INJECTION 0.25 MG/5ML
INTRAVENOUS | Status: AC
  Filled 2021-07-10: qty 5

## 2021-07-10 MED ORDER — SODIUM CHLORIDE 0.9% FLUSH
10.0000 mL | INTRAVENOUS | Status: AC | PRN
  Administered 2021-07-10: 10 mL
  Filled 2021-07-10: qty 10

## 2021-07-10 MED ORDER — SODIUM CHLORIDE 0.9 % IV SOLN
420.0000 mg | Freq: Once | INTRAVENOUS | Status: AC
  Administered 2021-07-10: 420 mg via INTRAVENOUS
  Filled 2021-07-10: qty 14

## 2021-07-10 MED ORDER — SODIUM CHLORIDE 0.9 % IV SOLN
150.0000 mg | Freq: Once | INTRAVENOUS | Status: AC
  Administered 2021-07-10: 150 mg via INTRAVENOUS
  Filled 2021-07-10: qty 150

## 2021-07-10 MED ORDER — DIPHENHYDRAMINE HCL 25 MG PO CAPS
ORAL_CAPSULE | ORAL | Status: AC
  Filled 2021-07-10: qty 1

## 2021-07-10 MED ORDER — PALONOSETRON HCL INJECTION 0.25 MG/5ML
0.2500 mg | Freq: Once | INTRAVENOUS | Status: AC
  Administered 2021-07-10: 0.25 mg via INTRAVENOUS

## 2021-07-10 MED ORDER — HEPARIN SOD (PORK) LOCK FLUSH 100 UNIT/ML IV SOLN
500.0000 [IU] | Freq: Once | INTRAVENOUS | Status: AC | PRN
  Administered 2021-07-10: 500 [IU]
  Filled 2021-07-10: qty 5

## 2021-07-10 MED ORDER — ACETAMINOPHEN 325 MG PO TABS
ORAL_TABLET | ORAL | Status: AC
  Filled 2021-07-10: qty 2

## 2021-07-10 MED ORDER — SODIUM CHLORIDE 0.9 % IV SOLN
75.0000 mg/m2 | Freq: Once | INTRAVENOUS | Status: AC
  Administered 2021-07-10: 170 mg via INTRAVENOUS
  Filled 2021-07-10: qty 17

## 2021-07-10 MED ORDER — SODIUM CHLORIDE 0.9% FLUSH
10.0000 mL | INTRAVENOUS | Status: DC | PRN
  Administered 2021-07-10: 10 mL
  Filled 2021-07-10: qty 10

## 2021-07-10 MED ORDER — SODIUM CHLORIDE 0.9 % IV SOLN
6.0000 mg/kg | Freq: Once | INTRAVENOUS | Status: AC
  Administered 2021-07-10: 651 mg via INTRAVENOUS
  Filled 2021-07-10: qty 31

## 2021-07-10 MED ORDER — ACETAMINOPHEN 325 MG PO TABS
650.0000 mg | ORAL_TABLET | Freq: Once | ORAL | Status: AC
  Administered 2021-07-10: 650 mg via ORAL

## 2021-07-10 MED ORDER — SODIUM CHLORIDE 0.9 % IV SOLN
700.0000 mg | Freq: Once | INTRAVENOUS | Status: AC
  Administered 2021-07-10: 700 mg via INTRAVENOUS
  Filled 2021-07-10: qty 70

## 2021-07-10 MED ORDER — SODIUM CHLORIDE 0.9 % IV SOLN
Freq: Once | INTRAVENOUS | Status: AC
Start: 2021-07-10 — End: 2021-07-10
  Filled 2021-07-10: qty 250

## 2021-07-10 MED ORDER — DIPHENHYDRAMINE HCL 25 MG PO CAPS
25.0000 mg | ORAL_CAPSULE | Freq: Once | ORAL | Status: AC
  Administered 2021-07-10: 25 mg via ORAL

## 2021-07-10 NOTE — Patient Instructions (Signed)
[facility]  Discharge Instructions: Thank you for choosing [facility] to provide your oncology and hematology care.   If you have a lab appointment with the [facility], please go directly to the [facility] and check in at the registration area.   Wear comfortable clothing and clothing appropriate for easy access to any Portacath or PICC line.   We strive to give you quality time with your provider. You may need to reschedule your appointment if you arrive late (15 or more minutes).  Arriving late affects you and other patients whose appointments are after yours.  Also, if you miss three or more appointments without notifying the office, you may be dismissed from the clinic at the provider's discretion.      For prescription refill requests, have your pharmacy contact our office and allow 72 hours for refills to be completed.    Today you received the following chemotherapy and/or immunotherapy agents trastuzumab, pertuzumab, docetaxel, carboplatin     To help prevent nausea and vomiting after your treatment, we encourage you to take your nausea medication as directed.  BELOW ARE SYMPTOMS THAT SHOULD BE REPORTED IMMEDIATELY: *FEVER GREATER THAN 100.4 F (38 C) OR HIGHER *CHILLS OR SWEATING *NAUSEA AND VOMITING THAT IS NOT CONTROLLED WITH YOUR NAUSEA MEDICATION *UNUSUAL SHORTNESS OF BREATH *UNUSUAL BRUISING OR BLEEDING *URINARY PROBLEMS (pain or burning when urinating, or frequent urination) *BOWEL PROBLEMS (unusual diarrhea, constipation, pain near the anus) TENDERNESS IN MOUTH AND THROAT WITH OR WITHOUT PRESENCE OF ULCERS (sore throat, sores in mouth, or a toothache) UNUSUAL RASH, SWELLING OR PAIN  UNUSUAL VAGINAL DISCHARGE OR ITCHING   Items with * indicate a potential emergency and should be followed up as soon as possible or go to the Emergency Department if any problems should occur.  Please show the CHEMOTHERAPY ALERT CARD or  IMMUNOTHERAPY ALERT CARD at check-in to the Emergency Department and triage nurse.  Should you have questions after your visit or need to cancel or reschedule your appointment, please contact [facility]  Dept: [phone]  and follow the prompts.  Office hours are 8:00 a.m. to 4:30 p.m. Monday - Friday. Please note that voicemails left after 4:00 p.m. may not be returned until the following business day.  We are closed weekends and major holidays. You have access to a nurse at all times for urgent questions. Please call the main number to the clinic Dept: [phone] and follow the prompts.   For any non-urgent questions, you may also contact your provider using MyChart. We now offer e-Visits for anyone 21 and older to request care online for non-urgent symptoms. For details visit [email or web address].   Also download the MyChart app! Go to the app store, search "MyChart", open the app, select [facility], and log in with your MyChart username and password.  Due to Covid, a mask is required upon entering the hospital/clinic. If you do not have a mask, one will be given to you upon arrival. For doctor visits, patients may have 1 support person aged 75 or older with them. For treatment visits, patients cannot have anyone with them due to current Covid guidelines and our immunocompromised population.

## 2021-07-12 ENCOUNTER — Other Ambulatory Visit: Payer: Self-pay

## 2021-07-12 ENCOUNTER — Other Ambulatory Visit: Payer: No Typology Code available for payment source

## 2021-07-12 ENCOUNTER — Inpatient Hospital Stay: Payer: No Typology Code available for payment source

## 2021-07-12 VITALS — BP 127/87 | HR 79 | Temp 99.1°F | Resp 20

## 2021-07-12 DIAGNOSIS — Z170 Estrogen receptor positive status [ER+]: Secondary | ICD-10-CM

## 2021-07-12 DIAGNOSIS — Z5111 Encounter for antineoplastic chemotherapy: Secondary | ICD-10-CM | POA: Diagnosis not present

## 2021-07-12 DIAGNOSIS — C50411 Malignant neoplasm of upper-outer quadrant of right female breast: Secondary | ICD-10-CM

## 2021-07-12 MED ORDER — PEGFILGRASTIM-BMEZ 6 MG/0.6ML ~~LOC~~ SOSY
6.0000 mg | PREFILLED_SYRINGE | Freq: Once | SUBCUTANEOUS | Status: AC
  Administered 2021-07-12: 6 mg via SUBCUTANEOUS

## 2021-07-12 MED ORDER — PEGFILGRASTIM-BMEZ 6 MG/0.6ML ~~LOC~~ SOSY
PREFILLED_SYRINGE | SUBCUTANEOUS | Status: AC
  Filled 2021-07-12: qty 0.6

## 2021-07-14 ENCOUNTER — Ambulatory Visit
Admission: RE | Admit: 2021-07-14 | Discharge: 2021-07-14 | Disposition: A | Discharge status: Home / Self Care | Payer: No Typology Code available for payment source | Source: Ambulatory Visit | Attending: Oncology | Admitting: Oncology

## 2021-07-14 DIAGNOSIS — Z170 Estrogen receptor positive status [ER+]: Secondary | ICD-10-CM

## 2021-07-14 MED ORDER — GADOBUTROL 1 MMOL/ML IV SOLN
10.0000 mL | Freq: Once | INTRAVENOUS | Status: AC | PRN
  Administered 2021-07-14: 10 mL via INTRAVENOUS

## 2021-07-16 ENCOUNTER — Other Ambulatory Visit: Payer: Self-pay

## 2021-07-16 ENCOUNTER — Telehealth: Payer: Self-pay

## 2021-07-16 ENCOUNTER — Other Ambulatory Visit: Payer: Self-pay | Admitting: Oncology

## 2021-07-16 DIAGNOSIS — Z170 Estrogen receptor positive status [ER+]: Secondary | ICD-10-CM

## 2021-07-16 DIAGNOSIS — C50411 Malignant neoplasm of upper-outer quadrant of right female breast: Secondary | ICD-10-CM

## 2021-07-16 NOTE — Telephone Encounter (Signed)
Pt given results of MRI concerning a possible DVT to LUE. Doppler ordered for 07/17/21 at 0900. Pt accepted appt.

## 2021-07-17 ENCOUNTER — Ambulatory Visit (HOSPITAL_COMMUNITY)
Admission: RE | Admit: 2021-07-17 | Discharge: 2021-07-17 | Disposition: A | Discharge status: Home / Self Care | Payer: No Typology Code available for payment source | Source: Ambulatory Visit | Attending: Oncology | Admitting: Oncology

## 2021-07-17 ENCOUNTER — Other Ambulatory Visit: Payer: Self-pay

## 2021-07-17 DIAGNOSIS — Z170 Estrogen receptor positive status [ER+]: Secondary | ICD-10-CM | POA: Diagnosis present

## 2021-07-17 DIAGNOSIS — C50411 Malignant neoplasm of upper-outer quadrant of right female breast: Secondary | ICD-10-CM

## 2021-07-17 NOTE — Progress Notes (Signed)
LUE venous duplex has been completed.  Preliminary results given to Dr. Jana Hakim.  Results can be found under chart review under CV PROC. 07/17/2021 9:42 AM Diane Miranda RVT, RDMS

## 2021-07-19 ENCOUNTER — Other Ambulatory Visit: Payer: No Typology Code available for payment source

## 2021-07-20 ENCOUNTER — Encounter: Payer: Self-pay | Admitting: *Deleted

## 2021-07-27 ENCOUNTER — Other Ambulatory Visit: Payer: Self-pay | Admitting: General Surgery

## 2021-07-27 DIAGNOSIS — Z170 Estrogen receptor positive status [ER+]: Secondary | ICD-10-CM

## 2021-07-27 DIAGNOSIS — C50511 Malignant neoplasm of lower-outer quadrant of right female breast: Secondary | ICD-10-CM

## 2021-07-31 ENCOUNTER — Other Ambulatory Visit: Payer: Self-pay | Admitting: General Surgery

## 2021-07-31 ENCOUNTER — Telehealth: Payer: Self-pay | Admitting: *Deleted

## 2021-07-31 ENCOUNTER — Inpatient Hospital Stay: Payer: No Typology Code available for payment source | Attending: Oncology

## 2021-07-31 ENCOUNTER — Encounter: Payer: Self-pay | Admitting: *Deleted

## 2021-07-31 ENCOUNTER — Other Ambulatory Visit: Payer: Self-pay

## 2021-07-31 ENCOUNTER — Inpatient Hospital Stay: Payer: No Typology Code available for payment source

## 2021-07-31 ENCOUNTER — Telehealth: Payer: Self-pay | Admitting: Radiation Oncology

## 2021-07-31 ENCOUNTER — Other Ambulatory Visit: Payer: Self-pay | Admitting: Oncology

## 2021-07-31 ENCOUNTER — Other Ambulatory Visit: Payer: Self-pay | Admitting: *Deleted

## 2021-07-31 VITALS — BP 120/78 | HR 94 | Temp 98.8°F | Resp 16 | Ht 64.0 in | Wt 221.5 lb

## 2021-07-31 DIAGNOSIS — Z5111 Encounter for antineoplastic chemotherapy: Secondary | ICD-10-CM | POA: Diagnosis present

## 2021-07-31 DIAGNOSIS — C773 Secondary and unspecified malignant neoplasm of axilla and upper limb lymph nodes: Secondary | ICD-10-CM | POA: Diagnosis not present

## 2021-07-31 DIAGNOSIS — Z170 Estrogen receptor positive status [ER+]: Secondary | ICD-10-CM

## 2021-07-31 DIAGNOSIS — C50411 Malignant neoplasm of upper-outer quadrant of right female breast: Secondary | ICD-10-CM | POA: Diagnosis present

## 2021-07-31 DIAGNOSIS — Z95828 Presence of other vascular implants and grafts: Secondary | ICD-10-CM

## 2021-07-31 DIAGNOSIS — C50511 Malignant neoplasm of lower-outer quadrant of right female breast: Secondary | ICD-10-CM

## 2021-07-31 DIAGNOSIS — Z79899 Other long term (current) drug therapy: Secondary | ICD-10-CM | POA: Insufficient documentation

## 2021-07-31 LAB — COMPREHENSIVE METABOLIC PANEL
ALT: 26 U/L (ref 0–44)
AST: 20 U/L (ref 15–41)
Albumin: 3.3 g/dL — ABNORMAL LOW (ref 3.5–5.0)
Alkaline Phosphatase: 46 U/L (ref 38–126)
Anion gap: 8 (ref 5–15)
BUN: 7 mg/dL (ref 6–20)
CO2: 26 mmol/L (ref 22–32)
Calcium: 8.8 mg/dL — ABNORMAL LOW (ref 8.9–10.3)
Chloride: 108 mmol/L (ref 98–111)
Creatinine, Ser: 0.92 mg/dL (ref 0.44–1.00)
GFR, Estimated: >60 mL/min (ref >60)
Glucose, Bld: 106 mg/dL — ABNORMAL HIGH (ref 70–99)
Potassium: 3.2 mmol/L — ABNORMAL LOW (ref 3.5–5.1)
Sodium: 142 mmol/L (ref 135–145)
Total Bilirubin: 0.3 mg/dL (ref 0.3–1.2)
Total Protein: 5.7 g/dL — ABNORMAL LOW (ref 6.5–8.1)

## 2021-07-31 LAB — CBC WITH DIFFERENTIAL/PLATELET
Abs Immature Granulocytes: 0.01 10*3/uL (ref 0.00–0.07)
Basophils Absolute: 0 10*3/uL (ref 0.0–0.1)
Basophils Relative: 1 %
Eosinophils Absolute: 0 10*3/uL (ref 0.0–0.5)
Eosinophils Relative: 0 %
HCT: 26.4 % — ABNORMAL LOW (ref 36.0–46.0)
Hemoglobin: 9.2 g/dL — ABNORMAL LOW (ref 12.0–15.0)
Immature Granulocytes: 0 %
Lymphocytes Relative: 25 %
Lymphs Abs: 1.3 10*3/uL (ref 0.7–4.0)
MCH: 37.1 pg — ABNORMAL HIGH (ref 26.0–34.0)
MCHC: 34.8 g/dL (ref 30.0–36.0)
MCV: 106.5 fL — ABNORMAL HIGH (ref 80.0–100.0)
Monocytes Absolute: 0.5 10*3/uL (ref 0.1–1.0)
Monocytes Relative: 9 %
Neutro Abs: 3.5 10*3/uL (ref 1.7–7.7)
Neutrophils Relative %: 65 %
Platelets: 216 10*3/uL (ref 150–400)
RBC: 2.48 MIL/uL — ABNORMAL LOW (ref 3.87–5.11)
RDW: 17.2 % — ABNORMAL HIGH (ref 11.5–15.5)
WBC: 5.4 10*3/uL (ref 4.0–10.5)
nRBC: 0 % (ref 0.0–0.2)

## 2021-07-31 MED ORDER — SODIUM CHLORIDE 0.9 % IV SOLN: Freq: Once | INTRAVENOUS | Status: AC

## 2021-07-31 MED ORDER — ACETAMINOPHEN 325 MG PO TABS
650.0000 mg | ORAL_TABLET | Freq: Once | ORAL | Status: AC
  Administered 2021-07-31: 650 mg via ORAL
  Filled 2021-07-31: qty 2

## 2021-07-31 MED ORDER — SODIUM CHLORIDE 0.9% FLUSH
10.0000 mL | Freq: Once | INTRAVENOUS | Status: AC
  Administered 2021-07-31: 10 mL via INTRAVENOUS

## 2021-07-31 MED ORDER — HEPARIN SOD (PORK) LOCK FLUSH 100 UNIT/ML IV SOLN
500.0000 [IU] | Freq: Once | INTRAVENOUS | Status: AC | PRN
  Administered 2021-07-31: 500 [IU]

## 2021-07-31 MED ORDER — TRASTUZUMAB-ANNS CHEMO 150 MG IV SOLR
6.0000 mg/kg | Freq: Once | INTRAVENOUS | Status: AC
  Administered 2021-07-31: 609 mg via INTRAVENOUS
  Filled 2021-07-31: qty 29

## 2021-07-31 MED ORDER — DIPHENHYDRAMINE HCL 25 MG PO CAPS
25.0000 mg | ORAL_CAPSULE | Freq: Once | ORAL | Status: AC
  Administered 2021-07-31: 25 mg via ORAL
  Filled 2021-07-31: qty 1

## 2021-07-31 MED ORDER — SODIUM CHLORIDE 0.9 % IV SOLN
420.0000 mg | Freq: Once | INTRAVENOUS | Status: AC
  Administered 2021-07-31: 420 mg via INTRAVENOUS
  Filled 2021-07-31: qty 14

## 2021-07-31 MED ORDER — TRASTUZUMAB-DKST CHEMO 150 MG IV SOLR: 6.0000 mg/kg | Freq: Once | INTRAVENOUS | Status: DC

## 2021-07-31 MED ORDER — SODIUM CHLORIDE 0.9% FLUSH
10.0000 mL | INTRAVENOUS | Status: DC | PRN
Start: 2021-07-31 — End: 2021-07-31
  Administered 2021-07-31: 10 mL

## 2021-07-31 NOTE — Patient Instructions (Addendum)
[facility]  Discharge Instructions: Thank you for choosing [facility] to provide your oncology and hematology care.   If you have a lab appointment with the [facility], please go directly to the [facility] and check in at the registration area.   Wear comfortable clothing and clothing appropriate for easy access to any Portacath or PICC line.   We strive to give you quality time with your provider. You may need to reschedule your appointment if you arrive late (15 or more minutes).  Arriving late affects you and other patients whose appointments are after yours.  Also, if you miss three or more appointments without notifying the office, you may be dismissed from the clinic at the provider's discretion.      For prescription refill requests, have your pharmacy contact our office and allow 72 hours for refills to be completed.    Today you received the following chemotherapy and/or immunotherapy agents Trastuzumab and perjeta      To help prevent nausea and vomiting after your treatment, we encourage you to take your nausea medication as directed.  BELOW ARE SYMPTOMS THAT SHOULD BE REPORTED IMMEDIATELY: *FEVER GREATER THAN 100.4 F (38 C) OR HIGHER *CHILLS OR SWEATING *NAUSEA AND VOMITING THAT IS NOT CONTROLLED WITH YOUR NAUSEA MEDICATION *UNUSUAL SHORTNESS OF BREATH *UNUSUAL BRUISING OR BLEEDING *URINARY PROBLEMS (pain or burning when urinating, or frequent urination) *BOWEL PROBLEMS (unusual diarrhea, constipation, pain near the anus) TENDERNESS IN MOUTH AND THROAT WITH OR WITHOUT PRESENCE OF ULCERS (sore throat, sores in mouth, or a toothache) UNUSUAL RASH, SWELLING OR PAIN  UNUSUAL VAGINAL DISCHARGE OR ITCHING   Items with * indicate a potential emergency and should be followed up as soon as possible or go to the Emergency Department if any problems should occur.  Please show the CHEMOTHERAPY ALERT CARD or IMMUNOTHERAPY ALERT CARD at  check-in to the Emergency Department and triage nurse.  Should you have questions after your visit or need to cancel or reschedule your appointment, please contact [facility]  Dept: [phone]  and follow the prompts.  Office hours are 8:00 a.m. to 4:30 p.m. Monday - Friday. Please note that voicemails left after 4:00 p.m. may not be returned until the following business day.  We are closed weekends and major holidays. You have access to a nurse at all times for urgent questions. Please call the main number to the clinic Dept: [phone] and follow the prompts.   For any non-urgent questions, you may also contact your provider using MyChart. We now offer e-Visits for anyone 54 and older to request care online for non-urgent symptoms. For details visit [email or web address].   Also download the MyChart app! Go to the app store, search "MyChart", open the app, select , and log in with your MyChart username and password.  Due to Covid, a mask is required upon entering the hospital/clinic. If you do not have a mask, one will be given to you upon arrival. For doctor visits, patients may have 1 support person aged 25 or older with them. For treatment visits, patients cannot have anyone with them due to current Covid guidelines and our immunocompromised population.    Potassium Content of Foods  Potassium is a mineral found in many foods and drinks. It affects how the heartworks, and helps keep fluids and minerals balanced in the body. The amount of potassium you need each day depends on your age and any medical conditions  you may have. Talk to your health care provider or dietitian abouthow much potassium you need. The following lists of foods provide the general serving size for foods and the approximate amount of potassium in each serving, listed in milligrams (mg).Actual values may vary depending on the product and how it is processed. High in  potassium The following foods and beverages have 200 mg or more of potassium per serving: Apricots (raw) - 2 have 200 mg of potassium. Apricots (dry) - 5 have 200 mg of potassium. Artichoke - 1 medium has 345 mg of potassium. Avocado -  fruit has 245 mg of potassium. Banana - 1 medium fruit has 425 mg of potassium. [place] or baked beans (canned) -  cup has 280 mg of potassium. White beans (canned) -  cup has 595 mg potassium. Beef roast - 3 oz has 320 mg of potassium. Ground beef - 3 oz has 270 mg of potassium. Beets (raw or cooked) -  cup has 260 mg of potassium. Bran muffin - 2 oz has 300 mg of potassium. Broccoli (cooked) -  cup has 230 mg of potassium. Brussels sprouts -  cup has 250 mg of potassium. Cantaloupe -  cup has 215 mg of potassium. Cereal, 100% bran -  cup has 200-400 mg of potassium. Cheeseburger -1 single fast food burger has 225-400 mg of potassium. Chicken - 3 oz has 220 mg of potassium. Clams (canned) - 3 oz has 535 mg of potassium. Crab - 3 oz has 225 mg of potassium. Dates - 5 have 270 mg of potassium. Dried beans and peas -  cup has 300-475 mg of potassium. Figs (dried) - 2 have 260 mg of potassium. Fish (halibut, tuna, cod, snapper) - 3 oz has 480 mg of potassium. Fish (salmon, haddock, swordfish, perch) - 3 oz has 300 mg of potassium. Fish (tuna, canned) - 3 oz has 200 mg of potassium. [place] fries (fast food) - 3 oz has 470 mg of potassium. Granola with fruit and nuts -  cup has 200 mg of potassium. Grapefruit juice -  cup has 200 mg of potassium. Honeydew melon -  cup has 200 mg of potassium. Kale (raw) - 1 cup has 300 mg of potassium. Kiwi - 1 medium fruit has 240 mg of potassium. Kohlrabi, rutabaga, parsnips -  cup has 280 mg of potassium. Lentils -  cup has 365 mg of potassium. Mango - 1 each has 325 mg of potassium. Milk (nonfat, low-fat, whole, buttermilk) - 1 cup has 350-380 mg of potassium. Milk (chocolate) - 1 cup has 420 mg of  potassium Molasses - 1 Tbsp has 295 mg of potassium. Mushrooms -  cup has 280 mg of potassium. Nectarine - 1 each has 275 mg of potassium. Nuts (almonds, peanuts, hazelnuts, [place], cashew, mixed) - 1 oz has 200 mg of potassium. Nuts (pistachios) - 1 oz has 295 mg of potassium. Orange - 1 fruit has 240 mg of potassium. Orange juice -  cup has 235 mg of potassium. Papaya -  medium fruit has 390 mg of potassium. Peanut butter (chunky) - 2 Tbsp has 240 mg of potassium. Peanut butter (smooth) - 2 Tbsp has 210 mg of potassium. Pear - 1 medium (200 mg) of potassium. Pomegranate - 1 whole fruit has 400 mg of potassium. Pomegranate juice -  cup has 215 mg of potassium. Pork - 3 oz has 350 mg of potassium. Potato chips (salted) - 1 oz has 465 mg of potassium. Potato (baked with skin) -  1 medium has 925 mg of potassium. Potato (boiled) -  cup has 255 mg of potassium. Potato (Mashed) -  cup has 330 mg of potassium. Prune juice -  cup has 370 mg of potassium. Prunes - 5 have 305 mg of potassium. Pudding (chocolate) -  cup has 230 mg of potassium. Pumpkin (canned) -  cup has 250 mg of potassium. Raisins (seedless) -  cup has 270 mg of potassium. Seeds (sunflower or pumpkin) - 1 oz has 240 mg of potassium. Soy milk - 1 cup has 300 mg of potassium. Spinach (cooked) - 1/2 cup has 420 mg of potassium. Spinach (canned) -  cup has 370 mg of potassium. Sweet potato (baked with skin) - 1 medium has 450 mg of potassium. Swiss chard -  cup has 480 mg of potassium. Tomato or vegetable juice -  cup has 275 mg of potassium. Tomato (sauce or puree) -  cup has 400-550 mg of potassium. Tomato (raw) - 1 medium has 290 mg of potassium. Tomato (canned) -  cup has 200-300 mg of potassium. [place] - 3 oz has 250 mg of potassium. Wheat germ - 1 oz has 250 mg of potassium. Winter squash -  cup has 250 mg of potassium. Yogurt (plain or fruited) - 6 oz has 260-435 mg of potassium. Zucchini -  cup has  220 mg of potassium. Moderate in potassium The following foods and beverages have 50-200 mg of potassium per serving: Apple - 1 fruit has 150 mg of potassium Apple juice -  cup has 150 mg of potassium Applesauce -  cup has 90 mg of potassium Apricot nectar -  cup has 140 mg of potassium Asparagus (small spears) -  cup has 155 mg of potassium Asparagus (large spears) - 6 have 155 mg of potassium Bagel (cinnamon raisin) - 1 four-inch bagel has 130 mg of potassium Bagel (egg or plain) - 1 four- inch bagel has 70 mg of potassium Beans (green) -  cup has 90 mg of potassium Beans (yellow) -  cup has 190 mg of potassium Beer, regular - 12 oz has 100 mg of potassium Beets (canned) -  cup has 125 mg of potassium Blackberries -  cup has 115 mg of potassium Blueberries -  cup has 60 mg of potassium Bread (whole wheat) - 1 slice has 70 mg of potassium Broccoli (raw) -  cup has 145 mg of potassium Cabbage -  cup has 150 mg of potassium Carrots (cooked or raw) -  cup has 180 mg of potassium Cauliflower (raw) -  cup has 150 mg of potassium Celery (raw) -  cup has 155 mg of potassium Cereal, bran flakes -  cup has 120-150 mg of potassium Cheese (cottage) -  cup has 110 mg of potassium Cherries - 10 have 150 mg of potassium Chocolate - 1 oz bar has 165 mg of potassium Coffee (brewed) - 6 oz has 90 mg of potassium Corn -  cup or 1 ear has 195 mg of potassium Cucumbers -  cup has 80 mg of potassium Egg - 1 large egg has 60 mg of potassium Eggplant -  cup has 60 mg of potassium Endive (raw) -  cup has 80 mg of potassium English muffin - 1 has 65 mg of potassium Fish (ocean perch) - 3 oz has 192 mg of potassium Frankfurter, beef or pork - 1 has 75 mg of potassium Fruit cocktail -  cup has 115 mg of potassium Grape juice -  cup  has 170 mg of potassium Grapefruit -  fruit has 175 mg of potassium Grapes -  cup has 155 mg of potassium Greens: kale, turnip, collard -  cup has  110-150 mg of potassium Ice cream or frozen yogurt (chocolate) -  cup has 175 mg of potassium Ice cream or frozen yogurt (vanilla) -  cup has 120-150 mg of potassium Lemons, limes - 1 each has 80 mg of potassium Lettuce - 1 cup has 100 mg of potassium Mixed vegetables -  cup has 150 mg of potassium Mushrooms, raw -  cup has 110 mg of potassium Nuts (walnuts, pecans, or macadamia) - 1 oz has 125 mg of potassium Oatmeal -  cup has 80 mg of potassium Okra -  cup has 110 mg of potassium Onions -  cup has 120 mg of potassium Peach - 1 has 185 mg of potassium Peaches (canned) -  cup has 120 mg of potassium Pears (canned) -  cup has 120 mg of potassium Peas, green (frozen) -  cup has 90 mg of potassium Peppers (Green) -  cup has 130 mg of potassium Peppers (Red) -  cup has 160 mg of potassium Pineapple juice -  cup has 165 mg of potassium Pineapple (fresh or canned) -  cup has 100 mg of potassium Plums - 1 has 105 mg of potassium Pudding, vanilla -  cup has 150 mg of potassium Raspberries -  cup has 90 mg of potassium Rhubarb -  cup has 115 mg of potassium Rice, wild -  cup has 80 mg of potassium Shrimp - 3 oz has 155 mg of potassium Spinach (raw) - 1 cup has 170 mg of potassium Strawberries -  cup has 125 mg of potassium Summer squash -  cup has 175-200 mg of potassium Swiss chard (raw) - 1 cup has 135 mg of potassium Tangerines - 1 fruit has 140 mg of potassium Tea, brewed - 6 oz has 65 mg of potassium Turnips -  cup has 140 mg of potassium Watermelon -  cup has 85 mg of potassium Wine (Red, table) - 5 oz has 180 mg of potassium Wine (White, table) - 5 oz 100 mg of potassium Low in potassium The following foods and beverages have less than 50 mg of potassium per serving. Bread (white) - 1 slice has 30 mg of potassium Carbonated beverages - 12 oz has less than 5 mg of potassium Cheese - 1 oz has 20-30 mg of potassium Cranberries -  cup has 45 mg of  potassium Cranberry juice cocktail -  cup has 20 mg of potassium Fats and oils - 1 Tbsp has less than 5 mg of potassium Hummus - 1 Tbsp has 32 mg of potassium Nectar (papaya, mango, or pear) -  cup has 35 mg of potassium Rice (white or brown) -  cup has 50 mg of potassium Spaghetti or macaroni (cooked) -  cup has 30 mg of potassium Tortilla, flour or corn - 1 has 50 mg of potassium Waffle - 1 four-inch waffle has 50 mg of potassium Water chestnuts -  cup has 40 mg of potassium Summary Potassium is a mineral found in many foods and drinks. It affects how the heart works, and helps keep fluids and minerals balanced in the body. The amount of potassium you need each day depends on your age and any existing medical conditions you may have. Your health care provider or dietitian may recommend an amount of potassium that you should have each  day. This information is not intended to replace advice given to you by your health care provider. Make sure you discuss any questions you have with your healthcare provider. Document Revised: 11/14/2017 Document Reviewed: 02/26/2017 Elsevier Patient Education  [place].

## 2021-07-31 NOTE — Progress Notes (Signed)
Pt refused 30 min wait after perjeta.  She states she never waits.

## 2021-07-31 NOTE — Telephone Encounter (Signed)
Informed by treatment room nurse of potassium of 3.2.  per MD - may proceed with treatment- pt should increase potassium rich foods - fruit - especially dried fruit is a good source

## 2021-08-14 ENCOUNTER — Encounter (HOSPITAL_COMMUNITY): Payer: Self-pay

## 2021-08-14 NOTE — Progress Notes (Signed)
Surgical Instructions    Your procedure is scheduled on 08/29/21.  Report to [facility] Main Entrance "A" at 7:00 A.M., then check in with the Admitting office.  Call this number if you have problems the morning of surgery:  [phone]   If you have any questions prior to your surgery date call [phone]: Open Monday-Friday 8am-4pm    Remember:  Do not eat after midnight the night before your surgery  You may drink clear liquids until 6:00 the morning of your surgery.   Clear liquids allowed are: Water, Non-Citrus Juices (without pulp), Carbonated Beverages, Clear Tea, Black Coffee ONLY (NO MILK, CREAM OR POWDERED CREAMER of any kind), and Gatorade    Take these medicines the morning of surgery with A SIP OF WATER: carvedilol (COREG) Fexofenadine HCl (ALLEGRA ALLERGY PO) pantoprazole (PROTONIX) rosuvastatin (CRESTOR) tobramycin-dexamethasone (TOBRADEX) - eye drops valACYclovir (VALTREX)  If Needed: acetaminophen (TYLENOL)  loratadine (CLARITIN)    HOW TO MANAGE YOUR DIABETES BEFORE AND AFTER SURGERY  Why is it important to control my blood sugar before and after surgery? Improving blood sugar levels before and after surgery helps healing and can limit problems. A way of improving blood sugar control is eating a healthy diet by:  Eating less sugar and carbohydrates  Increasing activity/exercise  Talking with your doctor about reaching your blood sugar goals High blood sugars (greater than 180 mg/dL) can raise your risk of infections and slow your recovery, so you will need to focus on controlling your diabetes during the weeks before surgery. Make sure that the doctor who takes care of your diabetes knows about your planned surgery including the date and location.  How do I manage my blood sugar before surgery? Check your blood sugar at least 4 times a day, starting 2 days before surgery, to make sure that the level is not too high or low.  Check your blood sugar the  morning of your surgery when you wake up and every 2 hours until you get to the Short Stay unit.  If your blood sugar is less than 70 mg/dL, you will need to treat for low blood sugar: Do not take insulin. Treat a low blood sugar (less than 70 mg/dL) with  cup of clear juice (cranberry or apple), 4 glucose tablets, OR glucose gel. Recheck blood sugar in 15 minutes after treatment (to make sure it is greater than 70 mg/dL). If your blood sugar is not greater than 70 mg/dL on recheck, call [phone] for further instructions. Report your blood sugar to the short stay nurse when you get to Short Stay.  If you are admitted to the hospital after surgery: Your blood sugar will be checked by the staff and you will probably be given insulin after surgery (instead of oral diabetes medicines) to make sure you have good blood sugar levels. The goal for blood sugar control after surgery is 80-180 mg/dL.    As of today, STOP taking any Aspirin (unless otherwise instructed by your surgeon) Aleve, Naproxen, Ibuprofen, Motrin, Advil, Goody's, BC's, all herbal medications, fish oil, and all vitamins.          Do not wear jewelry or makeup Do not wear lotions, powders, perfumes  or deodorant. Do not shave 48 hours prior to surgery.  Do not bring valuables to the hospital. DO Not wear nail polish, gel polish, artificial nails, or any other type of covering on  natural nails including finger and toenails. If patients have artificial nails, gel coating, etc. that  need to be removed by a nail salon please have this removed prior to surgery or surgery may need to be canceled/delayed if the surgeon/ anesthesia feels like the patient is unable to be adequately monitored.             [employer] is not responsible for any belongings or valuables.  Do NOT Smoke (Tobacco/Vaping) or drink Alcohol 24 hours prior to your procedure If you use a CPAP at night, you may bring all equipment for your overnight stay.    Contacts, glasses, dentures or bridgework may not be worn into surgery, please bring cases for these belongings   For patients admitted to the hospital, discharge time will be determined by your treatment team.   Patients discharged the day of surgery will not be allowed to drive home, and someone needs to stay with them for 24 hours.  ONLY 1 SUPPORT PERSON MAY BE PRESENT WHILE YOU ARE IN SURGERY. IF YOU ARE TO BE ADMITTED ONCE YOU ARE IN YOUR ROOM YOU WILL BE ALLOWED TWO (2) VISITORS.  Minor children may have two parents present. Special consideration for safety and communication needs will be reviewed on a case by case basis.  Special instructions:    Oral Hygiene is also important to reduce your risk of infection.  Remember - BRUSH YOUR TEETH THE MORNING OF SURGERY WITH YOUR REGULAR TOOTHPASTE   [facility]- Preparing For Surgery  Before surgery, you can play an important role. Because skin is not sterile, your skin needs to be as free of germs as possible. You can reduce the number of germs on your skin by washing with CHG (chlorahexidine gluconate) Soap before surgery.  CHG is an antiseptic cleaner which kills germs and bonds with the skin to continue killing germs even after washing.     Please do not use if you have an allergy to CHG or antibacterial soaps. If your skin becomes reddened/irritated stop using the CHG.  Do not shave (including legs and underarms) for at least 48 hours prior to first CHG shower. It is OK to shave your face.  Please follow these instructions carefully.     Shower the NIGHT BEFORE SURGERY and the MORNING OF SURGERY with CHG Soap.   If you chose to wash your hair, wash your hair first as usual with your normal shampoo. After you shampoo, rinse your hair and body thoroughly to remove the shampoo.  Then [employer] and genitals (private parts) with your normal soap and rinse thoroughly to remove soap.  After that Use CHG Soap as you would any other liquid  soap. You can apply CHG directly to the skin and wash gently with a scrungie or a clean washcloth.   Apply the CHG Soap to your body ONLY FROM THE NECK DOWN.  Do not use on open wounds or open sores. Avoid contact with your eyes, ears, mouth and genitals (private parts). Wash Face and genitals (private parts)  with your normal soap.   Wash thoroughly, paying special attention to the area where your surgery will be performed.  Thoroughly rinse your body with warm water from the neck down.  DO NOT shower/wash with your normal soap after using and rinsing off the CHG Soap.  Pat yourself dry with a CLEAN TOWEL.  Wear CLEAN PAJAMAS to bed the night before surgery  Place CLEAN SHEETS on your bed the night before your surgery  DO NOT SLEEP WITH PETS.   Day of Surgery:  Take a  shower with CHG soap. Wear Clean/Comfortable clothing the morning of surgery Do not apply any deodorants/lotions.   Remember to brush your teeth WITH YOUR REGULAR TOOTHPASTE.   Please read over the following fact sheets that you were given.

## 2021-08-15 ENCOUNTER — Encounter (HOSPITAL_COMMUNITY): Payer: Self-pay

## 2021-08-15 ENCOUNTER — Other Ambulatory Visit: Payer: Self-pay

## 2021-08-15 ENCOUNTER — Encounter (HOSPITAL_COMMUNITY)
Admission: RE | Admit: 2021-08-15 | Discharge: 2021-08-15 | Disposition: A | Discharge status: Home / Self Care | Payer: No Typology Code available for payment source | Source: Ambulatory Visit | Attending: General Surgery | Admitting: General Surgery

## 2021-08-15 DIAGNOSIS — Z01812 Encounter for preprocedural laboratory examination: Secondary | ICD-10-CM | POA: Diagnosis not present

## 2021-08-15 HISTORY — DX: Prediabetes: R73.03

## 2021-08-15 LAB — CBC
HCT: 32.9 % — ABNORMAL LOW (ref 36.0–46.0)
Hemoglobin: 10.8 g/dL — ABNORMAL LOW (ref 12.0–15.0)
MCH: 36.6 pg — ABNORMAL HIGH (ref 26.0–34.0)
MCHC: 32.8 g/dL (ref 30.0–36.0)
MCV: 111.5 fL — ABNORMAL HIGH (ref 80.0–100.0)
Platelets: 244 10*3/uL (ref 150–400)
RBC: 2.95 MIL/uL — ABNORMAL LOW (ref 3.87–5.11)
RDW: 15.6 % — ABNORMAL HIGH (ref 11.5–15.5)
WBC: 6.1 10*3/uL (ref 4.0–10.5)
nRBC: 0 % (ref 0.0–0.2)

## 2021-08-15 LAB — PREGNANCY, URINE: Preg Test, Ur: NEGATIVE

## 2021-08-15 LAB — GLUCOSE, CAPILLARY: Glucose-Capillary: 92 mg/dL (ref 70–99)

## 2021-08-15 NOTE — Progress Notes (Signed)
PCP - Dr. Alphonzo Grieve, manages Pre-DM  Cardiologist - Dr. Terri Skains  EP- Denies  Endocrine-Denies  Pulm-Denies  Chest x-ray - Denies  EKG - 04/12/21 (E)  Stress Test - Denies  ECHO - 06/27/21 (E)  Cardiac Cath - Denies  AICD-na PM-na LOOP-na  Dialysis-Denies  Sleep Study - Denies CPAP - Denies  LABS- 07/31/21 (E): CMP 08/15/21: CBC, UPreg  ASA- Denies  ERAS- Yes-no drink  HA1C- 08/15/21 Fasting Blood Sugar - 92 Pt does not check her bs at home.  Anesthesia- Yes- breast seed placement. CBC repeated at PAT appt due to a downward trend.  Pt denies having chest pain, sob, or fever at this time. All instructions explained to the pt, with a verbal understanding of the material. Pt agrees to go over the instructions while at home for a better understanding. Pt also instructed to self quarantine after being tested for COVID-19. The opportunity to ask questions was provided.    Coronavirus Screening  Have you experienced the following symptoms:  Cough yes/no: No Fever (>100.60F)  yes/no: No Runny nose yes/no: No Sore throat yes/no: No Difficulty breathing/shortness of breath  yes/no: No  Have you or a family member traveled in the last 14 days and where? yes/no: No   If the patient indicates "YES" to the above questions, their PAT will be rescheduled to limit the exposure to others and, the surgeon will be notified. THE PATIENT WILL NEED TO BE ASYMPTOMATIC FOR 14 DAYS.   If the patient is not experiencing any of these symptoms, the PAT nurse will instruct them to NOT bring anyone with them to their appointment since they may have these symptoms or traveled as well.   Please remind your patients and families that hospital visitation restrictions are in effect and the importance of the restrictions.

## 2021-08-16 LAB — HEMOGLOBIN A1C
Hgb A1c MFr Bld: 4.9 % (ref 4.8–5.6)
Mean Plasma Glucose: 94 mg/dL

## 2021-08-16 NOTE — Anesthesia Preprocedure Evaluation (Addendum)
Anesthesia Evaluation  Patient identified by MRN, date of birth, ID band Patient awake    Reviewed: Allergy & Precautions, NPO status , Patient's Chart, lab work & pertinent test results, reviewed documented beta blocker date and time   History of Anesthesia Complications Negative for: history of anesthetic complications  Airway Mallampati: III  TM Distance: >3 FB Neck ROM: Full    Dental  (+) Teeth Intact, Dental Advisory Given   Pulmonary asthma ,    Pulmonary exam normal breath sounds clear to auscultation       Cardiovascular hypertension, Pt. on home beta blockers and Pt. on medications Normal cardiovascular exam Rhythm:Regular Rate:Normal     Neuro/Psych  Headaches,    GI/Hepatic Neg liver ROS, GERD  Medicated and Controlled,  Endo/Other  Morbid obesity  Renal/GU negative Renal ROS     Musculoskeletal negative musculoskeletal ROS (+)   Abdominal   Peds  Hematology  (+) Blood dyscrasia, anemia , REFUSES BLOOD PRODUCTS, JEHOVAH'S WITNESS  Anesthesia Other Findings RIGHT BREAST CANCER  Reproductive/Obstetrics negative OB ROS                          Anesthesia Physical Anesthesia Plan  ASA: 3  Anesthesia Plan: General   Post-op Pain Management:  Regional for Post-op pain   Induction: Intravenous  PONV Risk Score and Plan: 3 and Midazolam, Dexamethasone and Ondansetron  Airway Management Planned: LMA  Additional Equipment:   Intra-op Plan:   Post-operative Plan: Extubation in OR  Informed Consent: I have reviewed the patients History and Physical, chart, labs and discussed the procedure including the risks, benefits and alternatives for the proposed anesthesia with the patient or authorized representative who has indicated his/her understanding and acceptance.     Dental advisory given  Plan Discussed with: CRNA  Anesthesia Plan Comments: (PAT note written 08/16/2021 by  Myra Gianotti, PA-C. )      Anesthesia Quick Evaluation

## 2021-08-16 NOTE — Progress Notes (Signed)
Anesthesia Chart Review:  Case: [id number] Date/Time: 08/29/21 0845   Procedures:      RIGHT BREAST LUMPECTOMY WITH RADIOACTIVE SEED AND RIGHT AXILLARY SENTINEL LYMPH NODE BIOPSY (Right: Breast)     RADIOACTIVE SEED GUIDED AXILLARY SENTINEL LYMPH NODE EXCISION (Right: Breast)   Anesthesia type: General   Pre-op diagnosis: RIGHT BREAST CANCER   Location: [place] OR ROOM 01 / [place] [facility]   Surgeons: Stark Klein, MD       DISCUSSION: Patient is a 45 year old female scheduled for the above procedure.  History includes never smoker, HTN, palpitations/tachycardia, prediabetes, childhood asthma, HLD, GERD, COVID-19 (10/2019), breast cancer (02/23/21 right breast biopsy: invasive ductal carcinoma, grade 3, + axillary LN, s/p chemotherapy), Port-A-Cath 03/22/2021.  Suspected pancreatic pseudocyst on 04/16/21 MRI with 59-monthfollow-up recommended.  BMI is consistent with morbid obesity.  She is a JRestaurant manager, fast food  Last Perjecta 07/31/21. H/H improved from 9.2/26.4 to 10.8/32.9 since 07/31/21. S she refuses blood products due to religious reasons.  RSL 08/28/21 at 1:00 PM. Urine pregnancy test negative on 08/15/21.  Anesthesia team to evaluate on the day of surgery.    VS: BP (!) 152/92   Pulse (!) 104   Temp 37.1 C (Oral)   Resp 17   Ht '5\' 3"'$  (1.6 m)   Wt 102.8 kg   LMP 04/30/2021 (Approximate)   SpO2 100%   BMI 40.16 kg/m    PROVIDERS: [place], RRalene Bathe MD is PCP  - TRex Kras [facility]  Last visit 05/24/2021 for tachycardia follow-up.  Palpitations/HR with no known reversible causes and better controlled on low-dose beta-blocker.  He uptitrated carvedilol. He notes she walks 30 to 40 minutes 3-4 times per week.  Follow-up echo planned given chemotherapy.  This was done in July and showed normal LVEF. - Magrinat, GSarajane Jews MD is HEM-ONC -Gery Pray MD is RAD-ONC   LABS: Most recent lab results include: Lab Results  Component Value Date   WBC 6.1  08/15/2021   HGB 10.8 (L) 08/15/2021   HCT 32.9 (L) 08/15/2021   PLT 244 08/15/2021   GLUCOSE 106 (H) 07/31/2021   ALT 26 07/31/2021   AST 20 07/31/2021   NA 142 07/31/2021   K 3.2 (L) 07/31/2021   CL 108 07/31/2021   CREATININE 0.92 07/31/2021   BUN 7 07/31/2021   CO2 26 07/31/2021   HGBA1C 4.9 08/15/2021     IMAGES: MRI Abd/MRCP 04/16/21: IMPRESSION: 1. Cystic lesion within the head of pancreas is again noted. No suspicious features identified. This is favored to represent a pseudocyst. Follow-up imaging in 6 months with repeat pancreas protocol MRI without and with contrast material is recommended. This recommendation follows ACR consensus guidelines: Management of Incidental Pancreatic Cysts: A White Paper of the ACR Incidental Findings Committee. J Am Coll Radiol [id number] 2. No evidence for metastatic disease within the abdomen. 3. Partially visualized enhancing left breast lesion identified measuring approximately 2.5 cm. Previously this was measured at 4.4 cm.  Bone Scan 03/23/21: IMPRESSION: 1. No evidence of bony metastatic disease. 2. Changes in the right breast compatible with the patient's recently diagnosed breast cancer.   1V PCXR 03/22/21: FINDINGS: The mediastinal contours are within normal limits. No cardiomegaly. Left subclavian port in place with catheter tip terminating at the cavoatrial junction. The lungs are clear bilaterally without evidence of focal consolidation, pleural effusion, or pneumothorax. No acute osseous abnormality. IMPRESSION: Status post left subclavian Port-A-Cath placement without evidence of pneumothorax.   EKG: EKG 04/12/2021: Sinus  tachycardia, 102 bpm, low voltage in the precordial leads, poor R wave progression, without underlying injury pattern.    CV: Echo 06/27/21: IMPRESSIONS   1. Left ventricular ejection fraction, by estimation, is 65 to 70%. The  left ventricle has hyperdynamic function. The left ventricle  has no  regional wall motion abnormalities. There is mild left ventricular  hypertrophy. Left ventricular diastolic  parameters are consistent with Grade I diastolic dysfunction (impaired  relaxation). The average left ventricular global longitudinal strain is  -24.6 %. The global longitudinal strain is normal.   2. Right ventricular systolic function is normal. The right ventricular  size is normal. Tricuspid regurgitation signal is inadequate for assessing  PA pressure.   3. The mitral valve is normal in structure. No evidence of mitral valve  regurgitation. No evidence of mitral stenosis.   4. The aortic valve is tricuspid. Aortic valve regurgitation is not  visualized. No aortic stenosis is present.   5. The inferior vena cava is normal in size with greater than 50%  respiratory variability, suggesting right atrial pressure of 3 mmHg.   3 day extended Holter monitor 04/12/21-04/16/21: Dominant rhythm normal sinus rhythm. Heart rate 70-156 bpm.  Avg HR 102 bpm. No atrial fibrillation, ventricular tachycardia, high grade AV block, pauses (3 seconds or longer). Total ventricular ectopic burden <1%. Total supraventricular ectopic burden <1%. Patient triggered events: 0.     Past Medical History:  Diagnosis Date   Allergy 2000   Asthma    as child   Breast cancer ([place])    Family history of lung cancer 03/08/2021   Family history of uterine cancer 03/08/2021   GERD (gastroesophageal reflux disease)    Headache    History of COVID-19    Hyperlipidemia    Hypertension    Pre-diabetes    Prediabetes    right breast ca dx'd 02/2021   Trimalleolar fracture of ankle, closed, left, initial encounter     Past Surgical History:  Procedure Laterality Date   BREAST BIOPSY Right 03/22/2021   Procedure: PUNCH BIOPSY OF RIGHT AREOLA;  Surgeon: Stark Klein, MD;  Location: [place];  Service: General;  Laterality: Right;   FRACTURE SURGERY  2018   NO PAST SURGERIES      ORIF ANKLE FRACTURE Left 07/10/2017   Procedure: OPEN REDUCTION INTERNAL FIXATION (ORIF) trimallolar ANKLE FRACTURE;  Surgeon: Wylene Simmer, MD;  Location: [facility];  Service: Orthopedics;  Laterality: Left;   PORTACATH PLACEMENT Left 03/22/2021   Procedure: INSERTION PORT-A-CATH;  Surgeon: Stark Klein, MD;  Location: [place];  Service: General;  Laterality: Left;    MEDICATIONS:  acetaminophen (TYLENOL) 500 MG tablet   carvedilol (COREG) 6.25 MG tablet   Fexofenadine HCl (ALLEGRA ALLERGY PO)   gabapentin (NEURONTIN) 300 MG capsule   lidocaine-prilocaine (EMLA) cream   lisinopril (ZESTRIL) 20 MG tablet   loratadine (CLARITIN) 10 MG tablet   Multiple Vitamins-Minerals (MULTIVITAMIN WITH MINERALS) tablet   ondansetron (ZOFRAN) 8 MG tablet   pantoprazole (PROTONIX) 20 MG tablet   prochlorperazine (COMPAZINE) 10 MG tablet   rosuvastatin (CRESTOR) 10 MG tablet   tobramycin-dexamethasone (TOBRADEX) ophthalmic solution   valACYclovir (VALTREX) 1000 MG tablet   No current facility-administered medications for this encounter.    Myra Gianotti, PA-C Surgical Short Stay/Anesthesiology [facility] Phone [phone] [facility] Phone [phone] 08/16/2021 5:40 PM

## 2021-08-20 NOTE — Progress Notes (Signed)
[name]  [phone] [phone]     ID: Diane Miranda DOB: 1976-09-18  MR#: [id number]  TOI#:[id number]  Patient Care Team: [name], MD as PCP - General (Internal Medicine) Mauro Kaufmann, RN as Oncology Nurse Navigator Rockwell Germany, RN as Oncology Nurse Navigator Stark Klein, MD as Consulting Physician (General Surgery) Jaqwan Wieber, Virgie Dad, MD as Consulting Physician (Oncology) Gery Pray, MD as Consulting Physician (Radiation Oncology) Wylene Simmer, MD as Consulting Physician (Orthopedic Surgery) Chauncey Cruel, MD OTHER MD:  CHIEF COMPLAINT: triple positive breast cancer  CURRENT TREATMENT: anti-HER2 treatment   INTERVAL HISTORY: [name] returns today for follow up and treatment of her triple positive breast cancer.  She continues on anti-HER2 treatment every 21 days to complete a year.  She is tolerating this remarkably well, and she tells me her sense of taste is finally coming back.  Since her last visit, she underwent post-treatment breast MRI on 07/14/2021 showing: marked interval improvement at site of patient's primary right breast malignancy and right axillary lymphadenopathy, no residual enhancement at primary site and previously enlarged lymph nodes now appear normal in size; continued right breast periareolar skin thickening with interval improvement of associated enhancement; no malignancy on the left; filing defect centrally within left axillary and subclavian veins.  She proceeded to upper extremity doppler on 07/17/2021 showing no evidence of thrombosis.  She is scheduled for right lumpectomy and right axillary sentinel lymph node biopsy on 08/29/21 under Dr. Barry Dienes.   REVIEW OF SYSTEMS: Diane Miranda had a very good holiday with family, and did a lot of cooking on Labor Day.  She still has a little bit of blurred vision but that is slightly improved.  She has no nausea.  She does not have excessive  fatigue.  Overall she is tolerating treatment remarkably well.   COVID 19 VACCINATION STATUS: refuses vaccination; infection 10/2019   HISTORY OF CURRENT ILLNESS: From the original intake note:  Diane Miranda herself palpated a right breast mass and noted associated pain. She underwent bilateral diagnostic mammography with tomography and right breast ultrasonography at [facility] on 02/15/2021 showing: breast density category B; palpable 5.4 cm mass within outer right breast; two abnormal right axillary lymph nodes; 1.8 cm indeterminate hypoechoic area within upper-outer right breast; no evidence of left breast malignancy.  Accordingly on 02/23/2021 she proceeded to biopsy of the right breast areas in question. The pathology from this procedure ([id number]) showed:  1. Right Breast, 11 o'clock  - adenosis and columnar cell hyperplasia; this was concordant 2. Right Breast, 9 o'clock  - invasive ductal carcinoma, grade 3  - Prognostic indicators significant for: estrogen receptor, 40% positive with moderate staining intensity and progesterone receptor, 60% positive with strong staining intensity. Proliferation marker Ki67 at 35%. HER2 positive by immunohistochemistry (3+). 3. Right Axilla, lymph node  - invasive ductal carcinoma involving nodal tissue  She underwent breast MRI on 03/04/2021 showing: breast composition B; 4.7 cm mass in 8-9 o'clock position of posterior right breast represents recently diagnosed malignancy; two adjacent abnormal right axillary lymph nodes, one of which represents the biopsy-proven metastatic node; significant skin thickening in periareolar right breast, with significant increased T2 signal consistent with edema; mild abnormal enhancement within thickened skin, nonspecific.  There was also a 1.1 cm nonspecific enhancing mass in left breast at 2-3 o'clock.  Cancer Staging Malignant neoplasm of upper-outer quadrant of right breast in female, estrogen  receptor positive ([place]) Staging form: Breast, AJCC 8th Edition -  Clinical: Stage IIB (cT3, cN1, cM0, G3, ER+, PR+, HER2+) - Signed by Chauncey Cruel, MD on 03/07/2021 Histologic grading system: 3 grade system  The patient's subsequent history is as detailed below.   PAST MEDICAL HISTORY: Past Medical History:  Diagnosis Date   Allergy 2000   Asthma    as child   Breast cancer ([place])    Family history of lung cancer 03/08/2021   Family history of uterine cancer 03/08/2021   GERD (gastroesophageal reflux disease)    Headache    History of COVID-19    Hyperlipidemia    Hypertension    Pre-diabetes    Prediabetes    right breast ca dx'd 2021-03-02   Trimalleolar fracture of ankle, closed, left, initial encounter     PAST SURGICAL HISTORY: Past Surgical History:  Procedure Laterality Date   BREAST BIOPSY Right 03/22/2021   Procedure: PUNCH BIOPSY OF RIGHT AREOLA;  Surgeon: Stark Klein, MD;  Location: [place];  Service: General;  Laterality: Right;   FRACTURE SURGERY  2018   NO PAST SURGERIES     ORIF ANKLE FRACTURE Left 07/10/2017   Procedure: OPEN REDUCTION INTERNAL FIXATION (ORIF) trimallolar ANKLE FRACTURE;  Surgeon: Wylene Simmer, MD;  Location: [place];  Service: Orthopedics;  Laterality: Left;   PORTACATH PLACEMENT Left 03/22/2021   Procedure: INSERTION PORT-A-CATH;  Surgeon: Stark Klein, MD;  Location: [place];  Service: General;  Laterality: Left;    FAMILY HISTORY: Family History  Problem Relation Age of Onset   Diabetes Father    Hypertension Father    Early death Mother    Heart disease Mother    Hypertension Mother    Hearing loss Maternal Grandmother    Hypertension Maternal Grandmother    Uterine cancer Other 60       MGM's sister   Throat cancer Other        MGM's brother; dx after 81   Lung cancer Other        PGM's sister; dx 22s   Diabetes Brother    Her father is living at age 60 as of 03-02-21.  Her mother died at age 7 from [place]. Diane Miranda has two brothers (and no sisters). She reports endometrial cancer in a maternal great aunt and lung cancer in a paternal aunt. There is no other family history of cancer to her knowledge.   GYNECOLOGIC HISTORY:  No LMP recorded. Menarche: 45 years old Age at first live birth: 45 years old [place] P 2 LMP early 03/02/2021 (has since had IUD placed) Contraceptive: IUD in place [place]), previously used pills from 1996-2006 HRT n/a  Hysterectomy? no BSO? 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 She is single. She lives at home with daughter Joylene Draft (age 14), adopted daughter Hetty Blend (age 37), and the patient's maternal grandmother, who has significant dementia issues. Daughter Levy Pupa, age 11, works in Press photographer in [place]. 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    ADVANCED DIRECTIVES: not in place; however the patient made it clear during her 03/07/2021 visit that she would not accept blood product transfusions for any reason including immediately life saving reasons   HEALTH MAINTENANCE: Social History   Tobacco Use   Smoking status: Never   Smokeless tobacco: Never  Vaping Use   Vaping Use: Never used  Substance Use Topics   Alcohol use: Yes    Comment: social   Drug  use: No     Colonoscopy: n/a (age)  PAP: 2020  Bone density: n/a (age)   Allergies  Allergen Reactions   Hydrochlorothiazide Anaphylaxis and Swelling    Current Outpatient Medications  Medication Sig Dispense Refill   acetaminophen (TYLENOL) 500 MG tablet Take 1 tablet (500 mg total) by mouth 3 (three) times daily with meals as needed. Take with aleve 220 mg tablet (Patient not taking: Reported on 08/14/2021) 90 tablet 0   carvedilol (COREG) 6.25 MG tablet Take 1 tablet (6.25 mg total) by mouth 2 (two) times daily. (Patient taking differently: Take 12.5 mg by mouth daily.) 180 tablet 3    Fexofenadine HCl (ALLEGRA ALLERGY PO) Take 1 tablet by mouth daily.     gabapentin (NEURONTIN) 300 MG capsule Take 1 capsule (300 mg total) by mouth at bedtime. 90 capsule 4   lidocaine-prilocaine (EMLA) cream Apply 1 application topically as needed [facility]).     lisinopril (ZESTRIL) 20 MG tablet Take 1 tablet (20 mg total) by mouth every morning. 90 tablet 1   loratadine (CLARITIN) 10 MG tablet Take 1 tablet (10 mg total) by mouth daily. (Patient not taking: No sig reported) 90 tablet 4   Multiple Vitamins-Minerals (MULTIVITAMIN WITH MINERALS) tablet Take 1 tablet by mouth daily.     ondansetron (ZOFRAN) 8 MG tablet Take 8 mg by mouth every 8 (eight) hours as needed for nausea or vomiting.     pantoprazole (PROTONIX) 20 MG tablet Take 20 mg by mouth daily.     prochlorperazine (COMPAZINE) 10 MG tablet Take 10 mg by mouth every 6 (six) hours as needed for nausea or vomiting.     rosuvastatin (CRESTOR) 10 MG tablet Take 10 mg by mouth daily.     tobramycin-dexamethasone (TOBRADEX) ophthalmic solution Place 1 drop into both eyes in the morning and at bedtime. 5 mL 2   valACYclovir (VALTREX) 1000 MG tablet TAKE 1 TABLET BY MOUTH EVERY DAY (Patient not taking: Reported on 08/14/2021) 90 tablet 1   No current facility-administered medications for this visit.    OBJECTIVE: African-American woman who appears stated age  45:   08/21/21 0828  BP: (!) 143/95  Pulse: 98  Resp: 19  Temp: 98.8 F (37.1 C)  SpO2: 100%     Body mass index is 40.1 kg/m.   Wt Readings from Last 3 Encounters:  08/21/21 226 lb 6.4 oz (102.7 kg)  08/15/21 226 lb 11.2 oz (102.8 kg)  07/31/21 221 lb 8 oz (100.5 kg)     ECOG FS:1 - Symptomatic but completely ambulatory  Sclerae unicteric, EOMs intact Wearing a mask No cervical or supraclavicular adenopathy Lungs no rales or rhonchi Heart regular rate and rhythm Abd soft, nontender, positive bowel sounds MSK no focal spinal tenderness, no upper extremity  lymphedema Neuro: nonfocal, well oriented, appropriate affect Breasts: I do not palpate a mass in the right breast and there are no skin or nipple changes of concern.  Left breast and both axillae are benign.   LAB RESULTS:  CMP     Component Value Date/Time   NA 142 07/31/2021 1024   K 3.2 (L) 07/31/2021 1024   CL 108 07/31/2021 1024   CO2 26 07/31/2021 1024   GLUCOSE 106 (H) 07/31/2021 1024   GLUCOSE 105 (H) 11/12/2006 1022   BUN 7 07/31/2021 1024   CREATININE 0.92 07/31/2021 1024   CREATININE 0.92 03/07/2021 1219   CALCIUM 8.8 (L) 07/31/2021 1024   PROT 5.7 (L) 07/31/2021 1024   ALBUMIN  3.3 (L) 07/31/2021 1024   AST 20 07/31/2021 1024   AST 13 (L) 03/07/2021 1219   ALT 26 07/31/2021 1024   ALT 16 03/07/2021 1219   ALKPHOS 46 07/31/2021 1024   BILITOT 0.3 07/31/2021 1024   BILITOT 0.6 03/07/2021 1219   GFRNONAA >60 07/31/2021 1024   GFRNONAA >60 03/07/2021 1219   GFRAA >90 01/30/2015 1319    No results found for: TOTALPROTELP, ALBUMINELP, A1GS, A2GS, BETS, BETA2SER, GAMS, MSPIKE, SPEI  Lab Results  Component Value Date   WBC 5.9 08/21/2021   NEUTROABS 3.6 08/21/2021   HGB 10.1 (L) 08/21/2021   HCT 30.0 (L) 08/21/2021   MCV 106.4 (H) 08/21/2021   PLT 205 08/21/2021    No results found for: LABCA2  No components found for: [id number]  No results for input(s): INR in the last 168 hours.  No results found for: LABCA2  No results found for: NKN397  No results found for: QBH419  No results found for: FXT024  No results found for: CA2729  No components found for: HGQUANT  No results found for: CEA1 / No results found for: CEA1   No results found for: AFPTUMOR  No results found for: CHROMOGRNA  No results found for: KPAFRELGTCHN, LAMBDASER, KAPLAMBRATIO (kappa/lambda light chains)  No results found for: HGBA, HGBA2QUANT, HGBFQUANT, HGBSQUAN (Hemoglobinopathy evaluation)   No results found for: LDH  No results found for: IRON, TIBC,  IRONPCTSAT (Iron and TIBC)  No results found for: FERRITIN  Urinalysis    Component Value Date/Time   COLORURINE YELLOW 01/30/2015 [place] 01/30/2015 1343   LABSPEC 1.011 01/30/2015 1343   PHURINE 7.5 01/30/2015 [place] 01/30/2015 [place] 01/30/2015 [place] 01/30/2015 1343   KETONESUR NEGATIVE 01/30/2015 1343   PROTEINUR NEGATIVE 01/30/2015 1343   UROBILINOGEN 0.2 01/30/2015 1343   NITRITE NEGATIVE 01/30/2015 1343   LEUKOCYTESUR SMALL (A) 01/30/2015 1343    STUDIES: No results found.   ELIGIBLE FOR AVAILABLE RESEARCH PROTOCOL:no  ASSESSMENT: 45 y.o. [place] woman status post right breast upper outer quadrant biopsy 02/23/2021 for a clinical T3 N1, stage IIB invasive ductal carcinoma, grade 3, triple positive, with an MIB-1 of 35%  (a) right periareolar biopsy 02/23/2021 was benign/concordant  (b) left breast nodule biopsy 03/14/2021 benign  (c) Punch biopsy of the right areolar area 03/22/2021 no malignancy  (d) CT of the chest abdomen and pelvis 03/20/2021 shows no evidence of metastatic disease; there is 2.5 cm cystic pancreatic mass and a 0.4 cm right liver lobe lesion requiring further follow-up  (e) bone scan 03/25/2021 shows no bone metastases  (1) genetics testing 03/26/2021 through the  [employer]RNA[employer] panel offered by [facility] found no deleterious mutations in APC, ATM, AXIN2, BARD1, BMPR1A, BRCA1, BRCA2, BRIP1, CDH1, CDK4, CDKN2A, CHEK2, DICER1, EPCAM, GREM1, HOXB13, MEN1, MLH1, MSH2, MSH3, MSH6, MUTYH, NBN, NF1, NF2, NTHL1, PALB2, PMS2, POLD1, POLE, PTEN, RAD51C, RAD51D, RECQL, RET, SDHA, SDHAF2, SDHB, SDHC, SDHD, SMAD4, SMARCA4, STK11, TP53, TSC1, TSC2, and VHL.  RNA data is routinely analyzed for use in variant interpretation for all genes.   (2) neoadjuvant chemotherapy consisting of carboplatin, docetaxel, trastuzumab and pertuzumab every 21 days x 6 beginning  03/28/2021  (3) anti-HER-2 immunotherapy to continue to total 1 year  (a) echo 03/13/2021 showed an ejection fraction in the 60- 65% range  (4) definitive surgery to follow  (5) adjuvant radiation  (6) antiestrogens   PLAN: Estie is tolerating the trastuzumab and  pertuzumab treatment remarkably well.  She has very minimal diarrhea and when she does get diarrhea she knows how to control it with Imodium.  The plan is to continue this until she completes a total of 1 year of anti-HER2 treatment.  I showed her the images of her MRI and compared them to the earlier ones.  She has had a remarkable radiologic response and I am hopeful she will have very good news with her upcoming surgery.  I am going to see her with her next trastuzumab/pertuzumab treatment so we can review her pathology results  Total encounter time 20 minutes.Sarajane Jews C. Arvin Abello, MD 08/21/2021 8:37 AM Medical Oncology and Hematology [facility] [place], [place] [address] Tel. [phone]    Fax. [phone]   This document serves as a record of services personally performed by Lurline Del, MD. It was created on his behalf by Wilburn Mylar, a trained medical scribe. The creation of this record is based on the scribe's personal observations and the provider's statements to them.   I, Lurline Del MD, have reviewed the above documentation for accuracy and completeness, and I agree with the above.   *Total Encounter Time as defined by the Centers for Medicare and Medicaid Services includes, in addition to the face-to-face time of a patient visit (documented in the note above) non-face-to-face time: obtaining and reviewing outside history, ordering and reviewing medications, tests or procedures, care coordination (communications with other health care professionals or caregivers) and documentation in the medical record.

## 2021-08-21 ENCOUNTER — Other Ambulatory Visit: Payer: Self-pay

## 2021-08-21 ENCOUNTER — Inpatient Hospital Stay (HOSPITAL_BASED_OUTPATIENT_CLINIC_OR_DEPARTMENT_OTHER): Payer: No Typology Code available for payment source | Admitting: Oncology

## 2021-08-21 ENCOUNTER — Inpatient Hospital Stay: Payer: No Typology Code available for payment source

## 2021-08-21 ENCOUNTER — Inpatient Hospital Stay: Payer: No Typology Code available for payment source | Attending: Oncology

## 2021-08-21 VITALS — BP 143/95 | HR 98 | Temp 98.8°F | Resp 19 | Wt 226.4 lb

## 2021-08-21 DIAGNOSIS — C50411 Malignant neoplasm of upper-outer quadrant of right female breast: Secondary | ICD-10-CM | POA: Insufficient documentation

## 2021-08-21 DIAGNOSIS — Z170 Estrogen receptor positive status [ER+]: Secondary | ICD-10-CM

## 2021-08-21 DIAGNOSIS — Z79899 Other long term (current) drug therapy: Secondary | ICD-10-CM | POA: Insufficient documentation

## 2021-08-21 DIAGNOSIS — Z5111 Encounter for antineoplastic chemotherapy: Secondary | ICD-10-CM | POA: Diagnosis present

## 2021-08-21 DIAGNOSIS — C773 Secondary and unspecified malignant neoplasm of axilla and upper limb lymph nodes: Secondary | ICD-10-CM | POA: Insufficient documentation

## 2021-08-21 DIAGNOSIS — Z95828 Presence of other vascular implants and grafts: Secondary | ICD-10-CM

## 2021-08-21 DIAGNOSIS — C50511 Malignant neoplasm of lower-outer quadrant of right female breast: Secondary | ICD-10-CM

## 2021-08-21 LAB — CBC WITH DIFFERENTIAL/PLATELET
Abs Immature Granulocytes: 0.01 10*3/uL (ref 0.00–0.07)
Basophils Absolute: 0 10*3/uL (ref 0.0–0.1)
Basophils Relative: 1 %
Eosinophils Absolute: 0.2 10*3/uL (ref 0.0–0.5)
Eosinophils Relative: 3 %
HCT: 30 % — ABNORMAL LOW (ref 36.0–46.0)
Hemoglobin: 10.1 g/dL — ABNORMAL LOW (ref 12.0–15.0)
Immature Granulocytes: 0 %
Lymphocytes Relative: 28 %
Lymphs Abs: 1.6 10*3/uL (ref 0.7–4.0)
MCH: 35.8 pg — ABNORMAL HIGH (ref 26.0–34.0)
MCHC: 33.7 g/dL (ref 30.0–36.0)
MCV: 106.4 fL — ABNORMAL HIGH (ref 80.0–100.0)
Monocytes Absolute: 0.4 10*3/uL (ref 0.1–1.0)
Monocytes Relative: 6 %
Neutro Abs: 3.6 10*3/uL (ref 1.7–7.7)
Neutrophils Relative %: 62 %
Platelets: 205 10*3/uL (ref 150–400)
RBC: 2.82 MIL/uL — ABNORMAL LOW (ref 3.87–5.11)
RDW: 14.4 % (ref 11.5–15.5)
WBC: 5.9 10*3/uL (ref 4.0–10.5)
nRBC: 0 % (ref 0.0–0.2)

## 2021-08-21 LAB — COMPREHENSIVE METABOLIC PANEL
ALT: 18 U/L (ref 0–44)
AST: 14 U/L — ABNORMAL LOW (ref 15–41)
Albumin: 3.4 g/dL — ABNORMAL LOW (ref 3.5–5.0)
Alkaline Phosphatase: 50 U/L (ref 38–126)
Anion gap: 9 (ref 5–15)
BUN: 12 mg/dL (ref 6–20)
CO2: 24 mmol/L (ref 22–32)
Calcium: 8.8 mg/dL — ABNORMAL LOW (ref 8.9–10.3)
Chloride: 110 mmol/L (ref 98–111)
Creatinine, Ser: 0.87 mg/dL (ref 0.44–1.00)
GFR, Estimated: >60 mL/min (ref >60)
Glucose, Bld: 105 mg/dL — ABNORMAL HIGH (ref 70–99)
Potassium: 3.5 mmol/L (ref 3.5–5.1)
Sodium: 143 mmol/L (ref 135–145)
Total Bilirubin: 0.6 mg/dL (ref 0.3–1.2)
Total Protein: 6 g/dL — ABNORMAL LOW (ref 6.5–8.1)

## 2021-08-21 MED ORDER — SODIUM CHLORIDE 0.9% FLUSH
10.0000 mL | Freq: Once | INTRAVENOUS | Status: AC
  Administered 2021-08-21: 10 mL via INTRAVENOUS

## 2021-08-21 MED ORDER — DIPHENHYDRAMINE HCL 25 MG PO CAPS
25.0000 mg | ORAL_CAPSULE | Freq: Once | ORAL | Status: AC
  Administered 2021-08-21: 25 mg via ORAL
  Filled 2021-08-21: qty 1

## 2021-08-21 MED ORDER — ACETAMINOPHEN 325 MG PO TABS
650.0000 mg | ORAL_TABLET | Freq: Once | ORAL | Status: AC
  Administered 2021-08-21: 650 mg via ORAL
  Filled 2021-08-21: qty 2

## 2021-08-21 MED ORDER — SODIUM CHLORIDE 0.9% FLUSH
10.0000 mL | INTRAVENOUS | Status: DC | PRN
Start: 2021-08-21 — End: 2021-08-21
  Administered 2021-08-21: 10 mL

## 2021-08-21 MED ORDER — SODIUM CHLORIDE 0.9 % IV SOLN: Freq: Once | INTRAVENOUS | Status: AC

## 2021-08-21 MED ORDER — PERTUZUMAB CHEMO INJECTION 420 MG/14ML
420.0000 mg | Freq: Once | INTRAVENOUS | Status: AC
  Administered 2021-08-21: 420 mg via INTRAVENOUS
  Filled 2021-08-21: qty 14

## 2021-08-21 MED ORDER — HEPARIN SOD (PORK) LOCK FLUSH 100 UNIT/ML IV SOLN
500.0000 [IU] | Freq: Once | INTRAVENOUS | Status: AC | PRN
  Administered 2021-08-21: 500 [IU]

## 2021-08-21 MED ORDER — TRASTUZUMAB-ANNS CHEMO 150 MG IV SOLR
6.0000 mg/kg | Freq: Once | INTRAVENOUS | Status: AC
  Administered 2021-08-21: 609 mg via INTRAVENOUS
  Filled 2021-08-21: qty 29

## 2021-08-21 NOTE — Patient Instructions (Signed)
[facility]  Discharge Instructions: Thank you for choosing [facility] to provide your oncology and hematology care.   If you have a lab appointment with the [facility], please go directly to the [facility] and check in at the registration area.   Wear comfortable clothing and clothing appropriate for easy access to any Portacath or PICC line.   We strive to give you quality time with your provider. You may need to reschedule your appointment if you arrive late (15 or more minutes).  Arriving late affects you and other patients whose appointments are after yours.  Also, if you miss three or more appointments without notifying the office, you may be dismissed from the clinic at the provider's discretion.      For prescription refill requests, have your pharmacy contact our office and allow 72 hours for refills to be completed.    Today you received the following chemotherapy and/or immunotherapy agents Trastuzumab and perjeta      To help prevent nausea and vomiting after your treatment, we encourage you to take your nausea medication as directed.  BELOW ARE SYMPTOMS THAT SHOULD BE REPORTED IMMEDIATELY: *FEVER GREATER THAN 100.4 F (38 C) OR HIGHER *CHILLS OR SWEATING *NAUSEA AND VOMITING THAT IS NOT CONTROLLED WITH YOUR NAUSEA MEDICATION *UNUSUAL SHORTNESS OF BREATH *UNUSUAL BRUISING OR BLEEDING *URINARY PROBLEMS (pain or burning when urinating, or frequent urination) *BOWEL PROBLEMS (unusual diarrhea, constipation, pain near the anus) TENDERNESS IN MOUTH AND THROAT WITH OR WITHOUT PRESENCE OF ULCERS (sore throat, sores in mouth, or a toothache) UNUSUAL RASH, SWELLING OR PAIN  UNUSUAL VAGINAL DISCHARGE OR ITCHING   Items with * indicate a potential emergency and should be followed up as soon as possible or go to the Emergency Department if any problems should occur.  Please show the CHEMOTHERAPY ALERT CARD or IMMUNOTHERAPY ALERT CARD at  check-in to the Emergency Department and triage nurse.  Should you have questions after your visit or need to cancel or reschedule your appointment, please contact [facility]  Dept: [phone]  and follow the prompts.  Office hours are 8:00 a.m. to 4:30 p.m. Monday - Friday. Please note that voicemails left after 4:00 p.m. may not be returned until the following business day.  We are closed weekends and major holidays. You have access to a nurse at all times for urgent questions. Please call the main number to the clinic Dept: [phone] and follow the prompts.   For any non-urgent questions, you may also contact your provider using MyChart. We now offer e-Visits for anyone 44 and older to request care online for non-urgent symptoms. For details visit [email or web address].   Also download the MyChart app! Go to the app store, search "MyChart", open the app, select [facility], and log in with your MyChart username and password.  Due to Covid, a mask is required upon entering the hospital/clinic. If you do not have a mask, one will be given to you upon arrival. For doctor visits, patients may have 1 support person aged 44 or older with them. For treatment visits, patients cannot have anyone with them due to current Covid guidelines and our immunocompromised population.

## 2021-08-24 ENCOUNTER — Encounter: Payer: Self-pay | Admitting: Cardiology

## 2021-08-24 ENCOUNTER — Ambulatory Visit: Payer: No Typology Code available for payment source | Admitting: Cardiology

## 2021-08-24 ENCOUNTER — Other Ambulatory Visit: Payer: Self-pay

## 2021-08-24 VITALS — BP 136/89 | HR 95 | Temp 97.8°F | Resp 16 | Ht 63.0 in | Wt 224.0 lb

## 2021-08-24 DIAGNOSIS — I10 Essential (primary) hypertension: Secondary | ICD-10-CM

## 2021-08-24 DIAGNOSIS — E782 Mixed hyperlipidemia: Secondary | ICD-10-CM

## 2021-08-24 DIAGNOSIS — C50411 Malignant neoplasm of upper-outer quadrant of right female breast: Secondary | ICD-10-CM

## 2021-08-24 MED ORDER — METOPROLOL SUCCINATE ER 50 MG PO TB24: 50.0000 mg | ORAL_TABLET | Freq: Every morning | ORAL | 0 refills | Status: DC

## 2021-08-24 MED ORDER — CARVEDILOL 25 MG PO TABS: 25.0000 mg | ORAL_TABLET | Freq: Two times a day (BID) | ORAL | 1 refills | Status: DC

## 2021-08-24 MED ORDER — LOSARTAN POTASSIUM 25 MG PO TABS: 25.0000 mg | ORAL_TABLET | Freq: Every morning | ORAL | 0 refills | Status: DC

## 2021-08-24 NOTE — Progress Notes (Signed)
Date:  08/24/2021   ID:  Diane Miranda, DOB 05/05/1976, MRN [id number]  PCP:  [name], MD  Cardiologist:  Rex Kras, DO, [facility] (established care 04/12/2021)  Date: 08/24/21 Last Office Visit: 05/24/2021  Chief Complaint  Patient presents with   Tachycardia   Palpitations   Follow-up    HPI  Diane Miranda is a 45 y.o. female who presents to the office with a chief complaint of "50-monthfollow-up." Patient's past medical history and cardiovascular risk factors include: Hx of COVID 2021, right sided breast cancer (dx March 2022) s/p chemotherapy, hyperlipidemia, hypertension, GERD, obesity due to excess calorie.   She is referred to the office at the request of [place], RRalene Bathe MD for evaluation of tachycardia.  She is referred to me for evaluation of tachycardia and palpitations after COVID-19 infection back in 2021.  Extended Holter monitor noted underlying rhythm to be normal sinus followed by sinus tachycardia without any significant dysrhythmias.  Given her diagnosis of breast cancer as of March 2022 I had transitioned her medical therapy to GDMT.  Amlodipine was transitioned to lisinopril and carvedilol was added.  Patient now presents for 662-monthollow-up..  She states that she has completed chemotherapy, and is scheduled for lumpectomy as well as lymph node dissection later next week and after the surgery plans to undergo radiation.  She recently had an echocardiogram in July 2022 which noted LVEF 60-65%, mild LVH, grade 1 diastolic impairment, and global longitudinal strain of -24.6%.  Patient denies any chest pain or shortness of breath at rest or with effort related activities.  She denies orthopnea, paroxysmal nocturnal dyspnea or lower extremity swelling.  Blood pressures are very well controlled.  Patient states that her systolic blood pressures are around 120-130 mmHg.  However, her pulse is still around 100 bpm.  She is currently on carvedilol  12.5 mg p.o. twice daily she also complains of intermittent coughs with lisinopril.  No family history of premature coronary disease or sudden cardiac death.  FUNCTIONAL STATUS: Walks 30 to 40 minutes 3-4 times a week.  ALLERGIES: Allergies  Allergen Reactions   Hydrochlorothiazide Anaphylaxis and Swelling    MEDICATION LIST PRIOR TO VISIT: Current Meds  Medication Sig   acetaminophen (TYLENOL) 500 MG tablet Take 1 tablet (500 mg total) by mouth 3 (three) times daily with meals as needed. Take with aleve 220 mg tablet   Fexofenadine HCl (ALLEGRA ALLERGY PO) Take 1 tablet by mouth daily.   gabapentin (NEURONTIN) 300 MG capsule Take 1 capsule (300 mg total) by mouth at bedtime.   lidocaine-prilocaine (EMLA) cream Apply 1 application topically as needed ([facility]   loratadine (CLARITIN) 10 MG tablet Take 1 tablet (10 mg total) by mouth daily.   losartan (COZAAR) 25 MG tablet Take 1 tablet (25 mg total) by mouth every morning.   metoprolol succinate (TOPROL XL) 50 MG 24 hr tablet Take 1 tablet (50 mg total) by mouth every morning.   Multiple Vitamins-Minerals (MULTIVITAMIN WITH MINERALS) tablet Take 1 tablet by mouth daily.   ondansetron (ZOFRAN) 8 MG tablet Take 8 mg by mouth every 8 (eight) hours as needed for nausea or vomiting.   pantoprazole (PROTONIX) 20 MG tablet Take 20 mg by mouth daily.   prochlorperazine (COMPAZINE) 10 MG tablet Take 10 mg by mouth every 6 (six) hours as needed for nausea or vomiting.   rosuvastatin (CRESTOR) 10 MG tablet Take 10 mg by mouth daily.   tobramycin-dexamethasone (TOBRADEX) ophthalmic solution Place 1  drop into both eyes in the morning and at bedtime.   [DISCONTINUED] carvedilol (COREG) 25 MG tablet Take 1 tablet (25 mg total) by mouth 2 (two) times daily.   [DISCONTINUED] carvedilol (COREG) 6.25 MG tablet Take 1 tablet (6.25 mg total) by mouth 2 (two) times daily. (Patient taking differently: Take 12.5 mg by mouth daily.)   [DISCONTINUED] lisinopril  (ZESTRIL) 20 MG tablet Take 1 tablet (20 mg total) by mouth every morning.     PAST MEDICAL HISTORY: Past Medical History:  Diagnosis Date   Allergy 2000   Asthma    as child   Breast cancer ([facility])    Family history of lung cancer 03/08/2021   Family history of uterine cancer 03/08/2021   GERD (gastroesophageal reflux disease)    Headache    History of COVID-19    Hyperlipidemia    Hypertension    Pre-diabetes    Prediabetes    right breast ca dx'd 02/2021   Trimalleolar fracture of ankle, closed, left, initial encounter     PAST SURGICAL HISTORY: Past Surgical History:  Procedure Laterality Date   BREAST BIOPSY Right 03/22/2021   Procedure: PUNCH BIOPSY OF RIGHT AREOLA;  Surgeon: Stark Klein, MD;  Location: [employer] [facility];  Service: General;  Laterality: Right;   FRACTURE SURGERY  2018   NO PAST SURGERIES     ORIF ANKLE FRACTURE Left 07/10/2017   Procedure: OPEN REDUCTION INTERNAL FIXATION (ORIF) trimallolar ANKLE FRACTURE;  Surgeon: Wylene Simmer, MD;  Location: [facility];  Service: Orthopedics;  Laterality: Left;   PORTACATH PLACEMENT Left 03/22/2021   Procedure: INSERTION PORT-A-CATH;  Surgeon: Stark Klein, MD;  Location: [facility];  Service: General;  Laterality: Left;    FAMILY HISTORY: The patient family history includes Diabetes in her brother and father; Early death in her mother; Hearing loss in her maternal grandmother; Heart disease in her mother; Hypertension in her father, maternal grandmother, and mother; Lung cancer in an other family member; Throat cancer in an other family member; Uterine cancer (age of onset: 64) in an other family member.  SOCIAL HISTORY:  The patient  reports that she has never smoked. She has never used smokeless tobacco. She reports current alcohol use. She reports that she does not use drugs.  REVIEW OF SYSTEMS: Review of Systems  Constitutional: Negative for chills and fever.  HENT:  Negative  for hoarse voice and nosebleeds.   Eyes:  Negative for discharge, double vision and pain.  Cardiovascular:  Positive for palpitations (Improved). Negative for chest pain, claudication, dyspnea on exertion, leg swelling, near-syncope, orthopnea, paroxysmal nocturnal dyspnea and syncope.  Respiratory:  Negative for hemoptysis and shortness of breath.   Musculoskeletal:  Negative for muscle cramps and myalgias.  Gastrointestinal:  Negative for abdominal pain, constipation, diarrhea, hematemesis, hematochezia, melena, nausea and vomiting.  Neurological:  Negative for dizziness and light-headedness.   PHYSICAL EXAM: Vitals with BMI 08/24/2021 08/24/2021 08/21/2021  Height - 5' 3"  -  Weight - 224 lbs 226 lbs 6 oz  BMI - 42.35 36.14  Systolic 431 540 086  Diastolic 89 93 95  Pulse 95 98 98    CONSTITUTIONAL: Well-developed and well-nourished. No acute distress.  SKIN: Skin is warm and dry. No rash noted. No cyanosis. No pallor. No jaundice HEAD: Normocephalic and atraumatic.  EYES: No scleral icterus MOUTH/THROAT: Moist oral membranes.  NECK: No JVD present. No thyromegaly noted. No carotid bruits  LYMPHATIC: No visible cervical adenopathy.  CHEST Normal respiratory  effort. No intercostal retractions  LUNGS: Clear to auscultation bilaterally.  No stridor. No wheezes. No rales.  CARDIOVASCULAR: Regular rate and rhythm, positive S1-S2, no murmurs rubs or gallops appreciated. ABDOMINAL: No apparent ascites.  EXTREMITIES: No peripheral edema  HEMATOLOGIC: No significant bruising NEUROLOGIC: Oriented to person, place, and time. Nonfocal. Normal muscle tone.  PSYCHIATRIC: Normal mood and affect. Normal behavior. Cooperative  CARDIAC DATABASE: EKG: 04/12/2021: Sinus tachycardia, 102 bpm, low voltage in the precordial leads, poor R wave progression, without underlying injury pattern.   Echocardiogram: 03/13/2021: LVEF 60-65%, no regional wall motion abnormalities, global longitudinal strain -24.7%,  right ventricular size and function normal, no significant valvular heart disease, right atrial pressure approximately 3 mmHg.   06/27/2021: LVEF 65 to 48%, grade 1 diastolic impairment, average GLS -24.6%, mild LVH, normal right ventricular size and function, no significant valvular heart disease.  Stress Testing: No results found for this or any previous visit from the past 1095 days.  Heart Catheterization: None  3 day extended Holter monitor: Dominant rhythm normal sinus rhythm. Heart rate 70-156 bpm.  Avg HR 102 bpm. No atrial fibrillation, ventricular tachycardia, high grade AV block, pauses (3 seconds or longer). Total ventricular ectopic burden <1%.   Total supraventricular ectopic burden <1%. Patient triggered events: 0.    LABORATORY DATA: CBC Latest Ref Rng & Units 08/21/2021 08/15/2021 07/31/2021  WBC 4.0 - 10.5 K/uL 5.9 6.1 5.4  Hemoglobin 12.0 - 15.0 g/dL 10.1(L) 10.8(L) 9.2(L)  Hematocrit 36.0 - 46.0 % 30.0(L) 32.9(L) 26.4(L)  Platelets 150 - 400 K/uL 205 244 216    CMP Latest Ref Rng & Units 08/21/2021 07/31/2021 07/10/2021  Glucose 70 - 99 mg/dL 105(H) 106(H) 169(H)  BUN 6 - 20 mg/dL 12 7 6   Creatinine 0.44 - 1.00 mg/dL 0.87 0.92 0.85  Sodium 135 - 145 mmol/L 143 142 144  Potassium 3.5 - 5.1 mmol/L 3.5 3.2(L) 3.6  Chloride 98 - 111 mmol/L 110 108 109  CO2 22 - 32 mmol/L 24 26 26   Calcium 8.9 - 10.3 mg/dL 8.8(L) 8.8(L) 9.2  Total Protein 6.5 - 8.1 g/dL 6.0(L) 5.7(L) 6.3(L)  Total Bilirubin 0.3 - 1.2 mg/dL 0.6 0.3 0.4  Alkaline Phos 38 - 126 U/L 50 46 58  AST 15 - 41 U/L 14(L) 20 21  ALT 0 - 44 U/L 18 26 28     Lipid Panel     Component Value Date/Time   CHOL 168 08/03/2008 0927   TRIG 73 08/03/2008 0927   TRIG 104 11/12/2006 1022   HDL 26.4 (L) 08/03/2008 0927   CHOLHDL 6.4 CALC 08/03/2008 0927   VLDL 15 08/03/2008 0927   LDLCALC 127 (H) 08/03/2008 0927    No components found for: NTPROBNP No results for input(s): PROBNP in the last 8760 hours. No  results for input(s): TSH in the last 8760 hours.  BMP Recent Labs    07/10/21 1018 07/31/21 1024 08/21/21 0805  NA 144 142 143  K 3.6 3.2* 3.5  CL 109 108 110  CO2 26 26 24   GLUCOSE 169* 106* 105*  BUN 6 7 12   CREATININE 0.85 0.92 0.87  CALCIUM 9.2 8.8* 8.8*  GFRNONAA >60 >60 >60    HEMOGLOBIN A1C Lab Results  Component Value Date   HGBA1C 4.9 08/15/2021   MPG 94 08/15/2021   External Labs:  Date Collected: 02/03/2021 , information obtained by PCP Potassium: 3.5 Creatinine 0.80 mg/dL. eGFR: 105 mL/min per 1.73 m Hemoglobin: 13.7 g/dL and hematocrit: 40.3 % Lipid profile: Total  cholesterol 188 , triglycerides 159 , HDL 30 , LDL 131, non-HDL 158 AST: 8 , ALT: 6 , alkaline phosphatase: 52  Hemoglobin A1c: 5.7 TSH: 1.41   IMPRESSION:    ICD-10-CM   1. Malignant neoplasm of upper-outer quadrant of right breast in female, estrogen receptor positive (HCC)  C50.411 metoprolol succinate (TOPROL XL) 50 MG 24 hr tablet   Z17.0 DISCONTINUED: carvedilol (COREG) 25 MG tablet    2. Benign hypertension  I10 losartan (COZAAR) 25 MG tablet    metoprolol succinate (TOPROL XL) 50 MG 24 hr tablet    DISCONTINUED: carvedilol (COREG) 25 MG tablet    3. Mixed hyperlipidemia  E78.2     4. Class 2 severe obesity due to excess calories with serious comorbidity and body mass index (BMI) of 39.0 to 39.9 in adult [facility])  E66.01    Z68.39        RECOMMENDATIONS: Diane Miranda is a 45 y.o. female whose past medical history and cardiac risk factors include: Hx of COVID 2021, right sided breast cancer (dx March 2022) s/p chemotherapy, hyperlipidemia, hypertension, GERD, obesity due to excess calorie.   Malignant neoplasm of upper-outer quadrant of right breast in female, estrogen receptor positive [facility]) Patient states that she is completed chemotherapy and is scheduled to have surgery as well as radiation in the coming weeks. Would like to uptitrate GDMT to help prevent  cardiotoxicity. Patient still remains tachycardic despite being on carvedilol 12.5 mg p.o. twice daily. We will transition her from carvedilol to Toprol-XL 50 mg p.o. daily. Recent echocardiogram from July 2022 notes preserved LVEF, grade 1 diastolic impairment, and normal GLS. Patient is asked to call the office if her pulse continues to be greater than 60 bpm.  Benign hypertension Blood pressures within acceptable range. Patient states that she is been experiencing a nonproductive cough which is most likely secondary to lisinopril. I will transition her from lisinopril to losartan 25 mg p.o. every afternoon.  Mixed hyperlipidemia Currently on statin therapy. Does not endorse myalgias. Patient states that she is due for annual visit with her PCP and will have her lipids rechecked. I have asked her kindly to bring in a copy of her labs at the next visit for review and reference.  Class 2 severe obesity due to excess calories with serious comorbidity and body mass index (BMI) of 39.0 to 39.9 in adult [facility]) Body mass index is 39.68 kg/m. I reviewed with the patient the importance of diet, regular physical activity/exercise, weight loss.   Patient is educated on increasing physical activity gradually as tolerated.  With the goal of moderate intensity exercise for 30 minutes a day 5 days a week.  FINAL MEDICATION LIST END OF ENCOUNTER: Meds ordered this encounter  Medications   DISCONTD: carvedilol (COREG) 25 MG tablet    Sig: Take 1 tablet (25 mg total) by mouth 2 (two) times daily.    Dispense:  180 tablet    Refill:  1   losartan (COZAAR) 25 MG tablet    Sig: Take 1 tablet (25 mg total) by mouth every morning.    Dispense:  90 tablet    Refill:  0   metoprolol succinate (TOPROL XL) 50 MG 24 hr tablet    Sig: Take 1 tablet (50 mg total) by mouth every morning.    Dispense:  90 tablet    Refill:  0     Medications Discontinued During This Encounter  Medication Reason    valACYclovir (VALTREX) 1000 MG  tablet Error   carvedilol (COREG) 6.25 MG tablet Dose change   lisinopril (ZESTRIL) 20 MG tablet Change in therapy   carvedilol (COREG) 25 MG tablet Discontinued by provider     Current Outpatient Medications:    acetaminophen (TYLENOL) 500 MG tablet, Take 1 tablet (500 mg total) by mouth 3 (three) times daily with meals as needed. Take with aleve 220 mg tablet, Disp: 90 tablet, Rfl: 0   Fexofenadine HCl (ALLEGRA ALLERGY PO), Take 1 tablet by mouth daily., Disp: , Rfl:    gabapentin (NEURONTIN) 300 MG capsule, Take 1 capsule (300 mg total) by mouth at bedtime., Disp: 90 capsule, Rfl: 4   lidocaine-prilocaine (EMLA) cream, Apply 1 application topically as needed [facility])., Disp: , Rfl:    loratadine (CLARITIN) 10 MG tablet, Take 1 tablet (10 mg total) by mouth daily., Disp: 90 tablet, Rfl: 4   losartan (COZAAR) 25 MG tablet, Take 1 tablet (25 mg total) by mouth every morning., Disp: 90 tablet, Rfl: 0   metoprolol succinate (TOPROL XL) 50 MG 24 hr tablet, Take 1 tablet (50 mg total) by mouth every morning., Disp: 90 tablet, Rfl: 0   Multiple Vitamins-Minerals (MULTIVITAMIN WITH MINERALS) tablet, Take 1 tablet by mouth daily., Disp: , Rfl:    ondansetron (ZOFRAN) 8 MG tablet, Take 8 mg by mouth every 8 (eight) hours as needed for nausea or vomiting., Disp: , Rfl:    pantoprazole (PROTONIX) 20 MG tablet, Take 20 mg by mouth daily., Disp: , Rfl:    prochlorperazine (COMPAZINE) 10 MG tablet, Take 10 mg by mouth every 6 (six) hours as needed for nausea or vomiting., Disp: , Rfl:    rosuvastatin (CRESTOR) 10 MG tablet, Take 10 mg by mouth daily., Disp: , Rfl:    tobramycin-dexamethasone (TOBRADEX) ophthalmic solution, Place 1 drop into both eyes in the morning and at bedtime., Disp: 5 mL, Rfl: 2  No orders of the defined types were placed in this encounter.   There are no Patient Instructions on file for this visit.   --Continue cardiac medications as reconciled in  final medication list. --Return in about 3 months (around 11/23/2021) for Follow up. Or sooner if needed. --Continue follow-up with your primary care physician regarding the management of your other chronic comorbid conditions.  Patient's questions and concerns were addressed to her satisfaction. She voices understanding of the instructions provided during this encounter.   This note was created using a voice recognition software as a result there may be grammatical errors inadvertently enclosed that do not reflect the nature of this encounter. Every attempt is made to correct such errors.  Rex Kras, [place], [facility]  Pager: [phone] Office: [phone]

## 2021-08-24 NOTE — H&P (Signed)
PROVIDER:  Georgianne Fick, MD   MRNSQ:[id number] DOB: 06-05-1976 Subjective    Chief Complaint: discuss breast surgery       History of Present Illness: Diane Miranda is a 45 y.o. female who is seen today for follow up of right breast cancer    Pt is a 45 yo F referred for consultation by Dr. Enriqueta Shutter for a new right breast cancer 02/2021.  The patient presented with a palpable mass.  Diagnostic imaging confirmed this, with a 5.4 cm mass at 9 o'clock, a 1.8 cm abnormal area at 11 o'clock, and 2 abnormal lymph nodes.  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  The 11 o'clock region was benign and concordant.  She received a breast MR as well and the dominant mass was 4.7 cm in greatest dimension, 2 nodes, and periareolar skin thickening on the right.  There was a 1.1 cm mass on the left at 2-3 o'clock.     She had menarche at age 7.  She is a G2P2 with first child in her late teens.  She has no close family cancer history.  Maternal great aunt did have endometrial cancer.  Paternal aunt had lung cancer.     She works as a Engineer, structural at [employer].     She has received neoadjuvant chemo which she tolerated relatively well and had follow up MRI showing resolution of mass and LAD.  She denies breast pain.  The MR was concerning for possible left axillary clot, but follow up duplex was negative.     Review of Systems: A complete review of systems was obtained from the patient.  I have reviewed this information and discussed as appropriate with the patient.  See HPI as well for other ROS.   Review of Systems  All other systems reviewed and are negative.       Medical History: Past Medical History      Past Medical History:  Diagnosis Date   Asthma, unspecified asthma severity, unspecified whether complicated, unspecified whether persistent     History of cancer     Hypertension             Patient Active  Problem List  Diagnosis   Malignant neoplasm of upper-outer quadrant of right breast in female, estrogen receptor positive (CMS-HCC)      Past Surgical History       Past Surgical History:  Procedure Laterality Date   Insertion Port-A-Cath   03/22/2021    Dr. Barry Dienes        Allergies      Allergies  Allergen Reactions   Hydrochlorothiazide Anaphylaxis and Swelling              Current Outpatient Medications on File Prior to Visit  Medication Sig Dispense Refill   carvediloL (COREG) 6.25 MG tablet Take by mouth       loratadine (CLARITIN) 10 mg tablet Take 1 tablet by mouth once daily       ondansetron (ZOFRAN) 8 MG tablet Take by mouth       prochlorperazine (COMPAZINE) 10 MG tablet Take by mouth       rosuvastatin (CRESTOR) 10 MG tablet Take 10 mg by mouth once daily       tobramycin-dexAMETHasone (TOBRADEX) 0.3-0.1 % ophthalmic suspension Apply to eye       dexAMETHasone (DECADRON) 4 MG tablet as directed       gabapentin (NEURONTIN) 300  MG capsule Take 300 mg by mouth at bedtime       lisinopriL (ZESTRIL) 20 MG tablet Take 20 mg by mouth every morning       multivitamin with minerals tablet Take 1 tablet by mouth once daily        No current facility-administered medications on file prior to visit.      Family History       Family History  Problem Relation Age of Onset   High blood pressure (Hypertension) Mother     High blood pressure (Hypertension) Father     Diabetes Father          Social History       Tobacco Use  Smoking Status Never Smoker  Smokeless Tobacco Never Used      Social History  Social History        Socioeconomic History   Marital status: Single  Tobacco Use   Smoking status: Never Smoker   Smokeless tobacco: Never Used  Substance and Sexual Activity   Alcohol use: Not Currently   Drug use: Never        Objective:         Vitals:     Pulse: (!) 120  Temp: 36.7 C (98 F)  SpO2: 98%  Weight: 98.4 kg (217 lb)   Height: 162.6 cm ('5\' 4"'$ )    Body mass index is 37.25 kg/m.     Head:   Normocephalic and atraumatic.  Eyes:    Conjunctivae are normal. Pupils are equal, round, and reactive to light. No scleral icterus.  Neck:   Normal range of motion. Neck supple. No tracheal deviation present. No thyromegaly present.  Resp:   No respiratory distress, normal effort. Breast: no residual palpable mass on right in LOQ.  No palpable LAD.  Breasts relatively symmetric.  No nipple retraction or discharge.  No skin dimpling.   Abd:      Abdomen is soft, non distended and non tender. No masses are palpable.  There is no rebound and no guarding.  Neurological: Alert and oriented to person, place, and time. Coordination normal.  Skin:    Skin is warm and dry. No rash noted. No diaphoretic. No erythema. No pallor.  Psychiatric: Normal mood and affect. Normal behavior. Judgment and thought content normal.      Labs, Imaging and Diagnostic Testing:   07/10/21 CBC with HCT 29.8 CMET with glucose 169     Assessment and Plan:  Diagnoses and all orders for this visit:   Malignant neoplasm of upper-outer quadrant of right breast in female, estrogen receptor positive (CMS-HCC) Assessment & Plan: Patient has had a fantastic imaging response.  She still desires breast conservation.  I will plan a seed localized lumpectomy with seed targeted axillary lymph node biopsy and sentinel lymph node biopsy.  I reviewed the rationale of seed placement with the patient.  I discussed the presurgery steps such as block and sentinel node injection.  I reviewed the location and number of incisions.  Her port is not ready to come out.   I discussed the risks of surgery including bleeding, infection, seroma, chronic pain or swelling, heart or lung issues, possible need for additional surgeries or procedures, or other.  She understands and wishes to proceed.  I also discussed postoperative restrictions and expectations.

## 2021-08-28 ENCOUNTER — Ambulatory Visit
Admission: RE | Admit: 2021-08-28 | Discharge: 2021-08-28 | Disposition: A | Discharge status: Home / Self Care | Payer: No Typology Code available for payment source | Source: Ambulatory Visit | Attending: General Surgery | Admitting: General Surgery

## 2021-08-28 ENCOUNTER — Other Ambulatory Visit: Payer: Self-pay

## 2021-08-28 DIAGNOSIS — C50511 Malignant neoplasm of lower-outer quadrant of right female breast: Secondary | ICD-10-CM

## 2021-08-29 ENCOUNTER — Encounter (HOSPITAL_COMMUNITY): Payer: Self-pay | Admitting: General Surgery

## 2021-08-29 ENCOUNTER — Ambulatory Visit (HOSPITAL_COMMUNITY): Payer: No Typology Code available for payment source | Admitting: Vascular Surgery

## 2021-08-29 ENCOUNTER — Encounter (HOSPITAL_COMMUNITY)
Admission: RE | Disposition: A | Discharge status: Home / Self Care | Payer: Self-pay | Source: Ambulatory Visit | Attending: General Surgery

## 2021-08-29 ENCOUNTER — Ambulatory Visit (HOSPITAL_COMMUNITY): Payer: No Typology Code available for payment source

## 2021-08-29 ENCOUNTER — Ambulatory Visit (HOSPITAL_COMMUNITY): Payer: No Typology Code available for payment source | Admitting: Anesthesiology

## 2021-08-29 ENCOUNTER — Ambulatory Visit
Admission: RE | Admit: 2021-08-29 | Discharge: 2021-08-29 | Disposition: A | Discharge status: Home / Self Care | Payer: No Typology Code available for payment source | Source: Ambulatory Visit | Attending: General Surgery | Admitting: General Surgery

## 2021-08-29 ENCOUNTER — Ambulatory Visit (HOSPITAL_COMMUNITY)
Admission: RE | Admit: 2021-08-29 | Discharge: 2021-08-29 | Disposition: A | Discharge status: Home / Self Care | Payer: No Typology Code available for payment source | Source: Ambulatory Visit | Attending: General Surgery | Admitting: General Surgery

## 2021-08-29 ENCOUNTER — Other Ambulatory Visit: Payer: Self-pay

## 2021-08-29 DIAGNOSIS — N6011 Diffuse cystic mastopathy of right breast: Secondary | ICD-10-CM | POA: Insufficient documentation

## 2021-08-29 DIAGNOSIS — Z9221 Personal history of antineoplastic chemotherapy: Secondary | ICD-10-CM | POA: Insufficient documentation

## 2021-08-29 DIAGNOSIS — Z79899 Other long term (current) drug therapy: Secondary | ICD-10-CM | POA: Insufficient documentation

## 2021-08-29 DIAGNOSIS — C50811 Malignant neoplasm of overlapping sites of right female breast: Secondary | ICD-10-CM | POA: Insufficient documentation

## 2021-08-29 DIAGNOSIS — Z170 Estrogen receptor positive status [ER+]: Secondary | ICD-10-CM

## 2021-08-29 DIAGNOSIS — C50511 Malignant neoplasm of lower-outer quadrant of right female breast: Secondary | ICD-10-CM

## 2021-08-29 DIAGNOSIS — N6021 Fibroadenosis of right breast: Secondary | ICD-10-CM | POA: Insufficient documentation

## 2021-08-29 HISTORY — PX: BREAST LUMPECTOMY WITH RADIOACTIVE SEED AND SENTINEL LYMPH NODE BIOPSY: SHX6550

## 2021-08-29 HISTORY — PX: RADIOACTIVE SEED GUIDED AXILLARY SENTINEL LYMPH NODE: SHX6735

## 2021-08-29 LAB — GLUCOSE, CAPILLARY: Glucose-Capillary: 107 mg/dL — ABNORMAL HIGH (ref 70–99)

## 2021-08-29 SURGERY — BREAST LUMPECTOMY WITH RADIOACTIVE SEED AND SENTINEL LYMPH NODE BIOPSY
Anesthesia: General | Site: Breast | Laterality: Right

## 2021-08-29 MED ORDER — ONDANSETRON HCL 4 MG/2ML IJ SOLN
INTRAMUSCULAR | Status: DC | PRN
  Administered 2021-08-29: 4 mg via INTRAVENOUS

## 2021-08-29 MED ORDER — OXYCODONE HCL 5 MG PO TABS: 5.0000 mg | ORAL_TABLET | Freq: Four times a day (QID) | ORAL | 0 refills | Status: DC | PRN

## 2021-08-29 MED ORDER — 0.9 % SODIUM CHLORIDE (POUR BTL) OPTIME
TOPICAL | Status: DC | PRN
  Administered 2021-08-29: 1000 mL

## 2021-08-29 MED ORDER — FENTANYL CITRATE (PF) 250 MCG/5ML IJ SOLN
INTRAMUSCULAR | Status: DC | PRN
  Administered 2021-08-29 (×2): 50 ug via INTRAVENOUS
  Administered 2021-08-29: 25 ug via INTRAVENOUS

## 2021-08-29 MED ORDER — CHLORHEXIDINE GLUCONATE 0.12 % MT SOLN
15.0000 mL | Freq: Once | OROMUCOSAL | Status: AC
  Administered 2021-08-29: 15 mL via OROMUCOSAL
  Filled 2021-08-29: qty 15

## 2021-08-29 MED ORDER — DEXAMETHASONE SODIUM PHOSPHATE 10 MG/ML IJ SOLN
INTRAMUSCULAR | Status: AC
  Filled 2021-08-29: qty 1

## 2021-08-29 MED ORDER — MIDAZOLAM HCL 2 MG/2ML IJ SOLN
INTRAMUSCULAR | Status: AC
  Filled 2021-08-29: qty 2

## 2021-08-29 MED ORDER — ORAL CARE MOUTH RINSE: 15.0000 mL | Freq: Once | OROMUCOSAL | Status: AC

## 2021-08-29 MED ORDER — BUPIVACAINE-EPINEPHRINE (PF) 0.25% -1:200000 IJ SOLN
INTRAMUSCULAR | Status: AC
  Filled 2021-08-29: qty 30

## 2021-08-29 MED ORDER — DEXAMETHASONE SODIUM PHOSPHATE 10 MG/ML IJ SOLN
INTRAMUSCULAR | Status: DC | PRN
  Administered 2021-08-29: 4 mg via INTRAVENOUS

## 2021-08-29 MED ORDER — ONDANSETRON HCL 4 MG/2ML IJ SOLN
INTRAMUSCULAR | Status: AC
  Filled 2021-08-29: qty 2

## 2021-08-29 MED ORDER — PROPOFOL 10 MG/ML IV BOLUS
INTRAVENOUS | Status: DC | PRN
  Administered 2021-08-29: 170 mg via INTRAVENOUS

## 2021-08-29 MED ORDER — METHYLENE BLUE 0.5 % INJ SOLN
INTRAVENOUS | Status: AC
  Filled 2021-08-29: qty 10

## 2021-08-29 MED ORDER — FENTANYL CITRATE (PF) 100 MCG/2ML IJ SOLN
25.0000 ug | INTRAMUSCULAR | Status: DC | PRN
  Administered 2021-08-29 (×2): 50 ug via INTRAVENOUS

## 2021-08-29 MED ORDER — PHENYLEPHRINE 40 MCG/ML (10ML) SYRINGE FOR IV PUSH (FOR BLOOD PRESSURE SUPPORT)
PREFILLED_SYRINGE | INTRAVENOUS | Status: AC
  Filled 2021-08-29: qty 10

## 2021-08-29 MED ORDER — LIDOCAINE 2% (20 MG/ML) 5 ML SYRINGE
INTRAMUSCULAR | Status: AC
  Filled 2021-08-29: qty 5

## 2021-08-29 MED ORDER — ACETAMINOPHEN 500 MG PO TABS
1000.0000 mg | ORAL_TABLET | ORAL | Status: AC
  Administered 2021-08-29: 1000 mg via ORAL
  Filled 2021-08-29: qty 2

## 2021-08-29 MED ORDER — TECHNETIUM TC 99M TILMANOCEPT KIT
1.0000 | PACK | Freq: Once | INTRAVENOUS | Status: AC | PRN
  Administered 2021-08-29: 1 via INTRADERMAL

## 2021-08-29 MED ORDER — FENTANYL CITRATE (PF) 100 MCG/2ML IJ SOLN
INTRAMUSCULAR | Status: AC
  Filled 2021-08-29: qty 2

## 2021-08-29 MED ORDER — FENTANYL CITRATE (PF) 100 MCG/2ML IJ SOLN: 100.0000 ug | Freq: Once | INTRAMUSCULAR | Status: AC

## 2021-08-29 MED ORDER — LIDOCAINE HCL (PF) 1 % IJ SOLN
INTRAMUSCULAR | Status: AC
  Filled 2021-08-29: qty 30

## 2021-08-29 MED ORDER — LACTATED RINGERS IV SOLN: INTRAVENOUS | Status: DC

## 2021-08-29 MED ORDER — BUPIVACAINE-EPINEPHRINE (PF) 0.5% -1:200000 IJ SOLN
INTRAMUSCULAR | Status: DC | PRN
  Administered 2021-08-29: 30 mL via PERINEURAL

## 2021-08-29 MED ORDER — CLONIDINE HCL (ANALGESIA) 100 MCG/ML EP SOLN
EPIDURAL | Status: DC | PRN
  Administered 2021-08-29: 100 ug

## 2021-08-29 MED ORDER — PROPOFOL 10 MG/ML IV BOLUS
INTRAVENOUS | Status: AC
  Filled 2021-08-29: qty 40

## 2021-08-29 MED ORDER — PHENYLEPHRINE HCL-NACL 20-0.9 MG/250ML-% IV SOLN
INTRAVENOUS | Status: DC | PRN
Start: 2021-08-29 — End: 2021-08-29
  Administered 2021-08-29: 20 ug/min via INTRAVENOUS

## 2021-08-29 MED ORDER — LIDOCAINE 2% (20 MG/ML) 5 ML SYRINGE
INTRAMUSCULAR | Status: DC | PRN
  Administered 2021-08-29: 80 mg via INTRAVENOUS

## 2021-08-29 MED ORDER — SUCCINYLCHOLINE CHLORIDE 200 MG/10ML IV SOSY
PREFILLED_SYRINGE | INTRAVENOUS | Status: AC
  Filled 2021-08-29: qty 10

## 2021-08-29 MED ORDER — CEFAZOLIN SODIUM-DEXTROSE 2-4 GM/100ML-% IV SOLN
2.0000 g | INTRAVENOUS | Status: AC
  Administered 2021-08-29: 2 g via INTRAVENOUS
  Filled 2021-08-29: qty 100

## 2021-08-29 MED ORDER — FENTANYL CITRATE (PF) 250 MCG/5ML IJ SOLN
INTRAMUSCULAR | Status: AC
  Filled 2021-08-29: qty 5

## 2021-08-29 MED ORDER — PROMETHAZINE HCL 25 MG/ML IJ SOLN: 6.2500 mg | INTRAMUSCULAR | Status: DC | PRN

## 2021-08-29 MED ORDER — SODIUM CHLORIDE (PF) 0.9 % IJ SOLN
INTRAMUSCULAR | Status: AC
  Filled 2021-08-29: qty 10

## 2021-08-29 MED ORDER — ROCURONIUM BROMIDE 10 MG/ML (PF) SYRINGE
PREFILLED_SYRINGE | INTRAVENOUS | Status: AC
  Filled 2021-08-29: qty 10

## 2021-08-29 MED ORDER — FENTANYL CITRATE (PF) 100 MCG/2ML IJ SOLN
INTRAMUSCULAR | Status: AC
  Administered 2021-08-29: 100 ug via INTRAVENOUS
  Filled 2021-08-29: qty 2

## 2021-08-29 MED ORDER — CHLORHEXIDINE GLUCONATE CLOTH 2 % EX PADS: 6.0000 | MEDICATED_PAD | Freq: Once | CUTANEOUS | Status: DC

## 2021-08-29 MED ORDER — MIDAZOLAM HCL 2 MG/2ML IJ SOLN
INTRAMUSCULAR | Status: AC
  Administered 2021-08-29: 2 mg via INTRAVENOUS
  Filled 2021-08-29: qty 2

## 2021-08-29 MED ORDER — LIDOCAINE HCL 1 % IJ SOLN
INTRAMUSCULAR | Status: DC | PRN
  Administered 2021-08-29: 10 mL

## 2021-08-29 MED ORDER — MIDAZOLAM HCL 2 MG/2ML IJ SOLN: 2.0000 mg | Freq: Once | INTRAMUSCULAR | Status: AC

## 2021-08-29 SURGICAL SUPPLY — 55 items
ADH SKN CLS APL DERMABOND .7 (GAUZE/BANDAGES/DRESSINGS) ×1
APL PRP STRL LF DISP 70% ISPRP (MISCELLANEOUS) ×1
BAG COUNTER SPONGE SURGICOUNT (BAG) ×2 IMPLANT
BAG SPNG CNTER NS LX DISP (BAG) ×1
BAG SURGICOUNT SPONGE COUNTING (BAG) ×1
BINDER BREAST LRG (GAUZE/BANDAGES/DRESSINGS) IMPLANT
BINDER BREAST XLRG (GAUZE/BANDAGES/DRESSINGS) ×2 IMPLANT
BNDG COHESIVE 4X5 TAN STRL (GAUZE/BANDAGES/DRESSINGS) ×3 IMPLANT
CANISTER SUCT 3000ML PPV (MISCELLANEOUS) ×3 IMPLANT
CHLORAPREP W/TINT 26 (MISCELLANEOUS) ×3 IMPLANT
CLIP VESOCCLUDE LG 6/CT (CLIP) ×3 IMPLANT
CLIP VESOCCLUDE MED 24/CT (CLIP) ×3 IMPLANT
CLOSURE WOUND 1/2 X4 (GAUZE/BANDAGES/DRESSINGS) ×1
CNTNR URN SCR LID CUP LEK RST (MISCELLANEOUS) IMPLANT
CONT SPEC 4OZ STRL OR WHT (MISCELLANEOUS)
COVER PROBE W GEL 5X96 (DRAPES) ×3 IMPLANT
COVER SURGICAL LIGHT HANDLE (MISCELLANEOUS) ×3 IMPLANT
DERMABOND ADVANCED (GAUZE/BANDAGES/DRESSINGS) ×2
DERMABOND ADVANCED .7 DNX12 (GAUZE/BANDAGES/DRESSINGS) ×1 IMPLANT
DEVICE DUBIN SPECIMEN MAMMOGRA (MISCELLANEOUS) IMPLANT
DRAPE CHEST BREAST 15X10 FENES (DRAPES) ×3 IMPLANT
DRAPE SURG 17X23 STRL (DRAPES) IMPLANT
DRSG PAD ABDOMINAL 8X10 ST (GAUZE/BANDAGES/DRESSINGS) ×4 IMPLANT
ELECT COATED BLADE 2.86 ST (ELECTRODE) ×3 IMPLANT
ELECT NDL BLADE 2-5/6 (NEEDLE) ×1 IMPLANT
ELECT NEEDLE BLADE 2-5/6 (NEEDLE) ×3 IMPLANT
ELECT REM PT RETURN 9FT ADLT (ELECTROSURGICAL) ×3
ELECTRODE REM PT RTRN 9FT ADLT (ELECTROSURGICAL) ×1 IMPLANT
GAUZE SPONGE 4X4 12PLY STRL (GAUZE/BANDAGES/DRESSINGS) ×2 IMPLANT
GLOVE SURG ENC MOIS LTX SZ6 (GLOVE) ×3 IMPLANT
GLOVE SURG UNDER LTX SZ6.5 (GLOVE) ×3 IMPLANT
GOWN STRL REUS W/ TWL LRG LVL3 (GOWN DISPOSABLE) ×1 IMPLANT
GOWN STRL REUS W/TWL 2XL LVL3 (GOWN DISPOSABLE) ×3 IMPLANT
GOWN STRL REUS W/TWL LRG LVL3 (GOWN DISPOSABLE) ×3
KIT BASIN OR (CUSTOM PROCEDURE TRAY) ×3 IMPLANT
KIT MARKER MARGIN INK (KITS) ×3 IMPLANT
LIGHT WAVEGUIDE WIDE FLAT (MISCELLANEOUS) IMPLANT
NDL 18GX1X1/2 (RX/OR ONLY) (NEEDLE) IMPLANT
NDL FILTER BLUNT 18X1 1/2 (NEEDLE) IMPLANT
NDL HYPO 25GX1X1/2 BEV (NEEDLE) ×1 IMPLANT
NEEDLE 18GX1X1/2 (RX/OR ONLY) (NEEDLE) IMPLANT
NEEDLE FILTER BLUNT 18X 1/2SAF (NEEDLE)
NEEDLE FILTER BLUNT 18X1 1/2 (NEEDLE) IMPLANT
NEEDLE HYPO 25GX1X1/2 BEV (NEEDLE) ×3 IMPLANT
NS IRRIG 1000ML POUR BTL (IV SOLUTION) ×3 IMPLANT
PACK GENERAL/GYN (CUSTOM PROCEDURE TRAY) ×3 IMPLANT
PACK UNIVERSAL I (CUSTOM PROCEDURE TRAY) ×3 IMPLANT
STOCKINETTE IMPERVIOUS 9X36 MD (GAUZE/BANDAGES/DRESSINGS) ×3 IMPLANT
STRIP CLOSURE SKIN 1/2X4 (GAUZE/BANDAGES/DRESSINGS) ×1 IMPLANT
SUT MNCRL AB 4-0 PS2 18 (SUTURE) ×3 IMPLANT
SUT VIC AB 3-0 SH 8-18 (SUTURE) ×3 IMPLANT
SYR CONTROL 10ML LL (SYRINGE) ×3 IMPLANT
TOWEL GREEN STERILE (TOWEL DISPOSABLE) ×3 IMPLANT
TOWEL GREEN STERILE FF (TOWEL DISPOSABLE) ×3 IMPLANT
TRACER MAGTRACE VIAL (MISCELLANEOUS) ×2 IMPLANT

## 2021-08-29 NOTE — Anesthesia Procedure Notes (Signed)
Anesthesia Regional Block: Pectoralis block   Pre-Anesthetic Checklist: , timeout performed,  Correct Patient, Correct Site, Correct Laterality,  Correct Procedure, Correct Position, site marked,  Risks and benefits discussed,  Surgical consent,  Pre-op evaluation,  At surgeon's request and post-op pain management  Laterality: Right  Prep: chloraprep       Needles:  Injection technique: Single-shot  Needle Type: Echogenic Needle     Needle Length: 9cm  Needle Gauge: 21     Additional Needles:   Procedures:,,,, ultrasound used (permanent image in chart),,    Narrative:  Start time: 08/29/2021 8:14 AM End time: 08/29/2021 8:20 AM Injection made incrementally with aspirations every 5 mL.  Performed by: Personally  Anesthesiologist: Catalina Gravel, MD  Additional Notes: No pain on injection. No increased resistance to injection. Injection made in 5cc increments.  Good needle visualization.  Patient tolerated procedure well.

## 2021-08-29 NOTE — Transfer of Care (Signed)
Immediate Anesthesia Transfer of Care Note  Patient: Diane Miranda  Procedure(s) Performed: RIGHT BREAST LUMPECTOMY WITH RADIOACTIVE SEED AND RIGHT AXILLARY SENTINEL LYMPH NODE BIOPSY (Right: Breast) RADIOACTIVE SEED GUIDED AXILLARY SENTINEL LYMPH NODE EXCISION (Right: Breast)  Patient Location: PACU  Anesthesia Type:General  Level of Consciousness: awake, alert , oriented and patient cooperative  Airway & Oxygen Therapy: Patient Spontanous Breathing and Patient connected to nasal cannula oxygen  Post-op Assessment: Report given to RN, Post -op Vital signs reviewed and stable and Patient moving all extremities  Post vital signs: Reviewed and stable  Last Vitals:  Vitals Value Taken Time  BP 124/57   Temp    Pulse 91 08/29/21 1138  Resp 27 08/29/21 1138  SpO2 96 % 08/29/21 1138  Vitals shown include unvalidated device data.  Last Pain:  Vitals:   08/29/21 0758  TempSrc:   PainSc: 0-No pain         Complications: No notable events documented.

## 2021-08-29 NOTE — Interval H&P Note (Signed)
History and Physical Interval Note:  08/29/2021 9:03 AM  Diane Miranda  has presented today for surgery, with the diagnosis of RIGHT BREAST CANCER.  The various methods of treatment have been discussed with the patient and family. After consideration of risks, benefits and other options for treatment, the patient has consented to  Procedure(s): RIGHT BREAST LUMPECTOMY WITH RADIOACTIVE SEED AND RIGHT AXILLARY SENTINEL LYMPH NODE BIOPSY (Right) RADIOACTIVE SEED GUIDED AXILLARY SENTINEL LYMPH NODE EXCISION (Right) as a surgical intervention.  The patient's history has been reviewed, patient examined, no change in status, stable for surgery.  I have reviewed the patient's chart and labs.  Questions were answered to the patient's satisfaction.     Stark Klein

## 2021-08-29 NOTE — Op Note (Signed)
Right Breast Radioactive seed localized lumpectomy and sentinel lymph node biopsy  Indications: This patient presents with history of right breast cancer  Pre-operative Diagnosis: right breast cancer, 9 o'clock, cT3N1M0, grade 3 invasive ductal triple positive cancer; s/p neoadjuvant chemotherapy  Post-operative Diagnosis: Same  Surgeon: Stark Klein   Anesthesia: General endotracheal anesthesia  ASA Class: 3  Procedure Details  The patient was seen in the Holding Room. The risks, benefits, complications, treatment options, and expected outcomes were discussed with the patient. The possibilities of bleeding, infection, the need for additional procedures, failure to diagnose a condition, and creating a complication requiring transfusion or operation were discussed with the patient. The patient concurred with the proposed plan, giving informed consent.  The site of surgery properly noted/marked. The patient was taken to Operating Room # 1, identified, and the procedure verified as right Breast Seed localized Lumpectomy with sentinel lymph node biopsy, seed localized right axillary sentinel node. A Time Out was held and the above information confirmed.  The right  arm, breast, and chest were prepped and draped in standard fashion. MagTrace was injected in the subareolar space.  The lumpectomy was performed by creating a transverse incision in the LOQ near the previously placed radioactive seed.  Dissection was carried down to around the point of maximum signal intensity. The cautery was used to perform the dissection.  Hemostasis was achieved with cautery. The edges of the cavity were marked with large clips, with one each medial, lateral, inferior and superior, and two clips posteriorly.   The specimen was inked with the margin marker paint kit.    Specimen radiography confirmed inclusion of the mammographic lesion, the clip, and the seed.  The background signal in the breast was zero.  The wound was  irrigated and closed with 3-0 vicryl in layers and 4-0 monocryl subcuticular suture.    Using a hand-held gamma probe, right axillary sentinel nodes were identified transcutaneously.  An oblique incision was created below the axillary hairline.  Dissection was carried through the clavipectoral fascia.  The seed was located in non lymphatic tissue superficially.  This was removed and specimen mammogram was obtained.  Three deep level two axillary sentinel nodes were removed.  Counts per second were 290, 0, and 490.    The background count was 19 cps.  The wound was irrigated.  Hemostasis was achieved with cautery.  The axillary incision was closed with a 3-0 vicryl deep dermal interrupted sutures and a 4-0 monocryl subcuticular closure.    Sterile dressings were applied. At the end of the operation, all sponge, instrument, and needle counts were correct.  Findings: grossly clear surgical margins. Posterior margin is pectoralis, anterior margin is skin SLN #1 T99 and magtrace SLN #2 palpable SLN #3 T99, MagTrace and rust colored.   Estimated Blood Loss:  min         Specimens: right breast tissue with seed and three right axillary sentinel lymph nodes.             Complications:  None; patient tolerated the procedure well.         Disposition: PACU - hemodynamically stable.         Condition: stable

## 2021-08-29 NOTE — Anesthesia Procedure Notes (Signed)
Procedure Name: LMA Insertion Date/Time: 08/29/2021 9:17 AM Performed by: Lowella Dell, CRNA Pre-anesthesia Checklist: Patient identified, Emergency Drugs available, Suction available and Patient being monitored Patient Re-evaluated:Patient Re-evaluated prior to induction Oxygen Delivery Method: Circle System Utilized Preoxygenation: Pre-oxygenation with 100% oxygen Induction Type: IV induction Ventilation: Mask ventilation without difficulty LMA: LMA inserted LMA Size: 4.0 Number of attempts: 1 Airway Equipment and Method: Bite block Placement Confirmation: positive ETCO2 Tube secured with: Tape Dental Injury: Teeth and Oropharynx as per pre-operative assessment

## 2021-08-29 NOTE — Discharge Instructions (Addendum)
[facility] Office Phone Number [phone]  BREAST BIOPSY/ PARTIAL MASTECTOMY: POST OP INSTRUCTIONS  Always review your discharge instruction sheet given to you by the facility where your surgery was performed.  IF YOU HAVE DISABILITY OR FAMILY LEAVE FORMS, YOU MUST BRING THEM TO THE OFFICE FOR PROCESSING.  DO NOT GIVE THEM TO YOUR DOCTOR.  A prescription for pain medication may be given to you upon discharge.  Take your pain medication as prescribed, if needed.  If narcotic pain medicine is not needed, then you may take acetaminophen (Tylenol) or ibuprofen (Advil) as needed. Take your usually prescribed medications unless otherwise directed If you need a refill on your pain medication, please contact your pharmacy.  They will contact our office to request authorization.  Prescriptions will not be filled after 5pm or on week-ends. You should eat very light the first 24 hours after surgery, such as soup, crackers, pudding, etc.  Resume your normal diet the day after surgery. Most patients will experience some swelling and bruising in the breast.  Ice packs and a good support bra will help.  Swelling and bruising can take several days to resolve.  It is common to experience some constipation if taking pain medication after surgery.  Increasing fluid intake and taking a stool softener will usually help or prevent this problem from occurring.  A mild laxative (Milk of Magnesia or Miralax) should be taken according to package directions if there are no bowel movements after 48 hours. Unless discharge instructions indicate otherwise, you may remove your bandages 48 hours after surgery, and you may shower at that time.  You may have steri-strips (small skin tapes) in place directly over the incision.  These strips should be left on the skin for 7-10 days.   Any sutures or staples will be removed at the office during your follow-up visit. ACTIVITIES:  You may resume regular daily activities  (gradually increasing) beginning the next day.  Wearing a good support bra or sports bra (or the breast binder) minimizes pain and swelling.  You may have sexual intercourse when it is comfortable. You may drive when you no longer are taking prescription pain medication, you can comfortably wear a seatbelt, and you can safely maneuver your car and apply brakes. RETURN TO WORK:  __________1 week_______________ You should see your doctor in the office for a follow-up appointment approximately two weeks after your surgery.  Your doctor's nurse will typically make your follow-up appointment when she calls you with your pathology report.  Expect your pathology report 2-3 business days after your surgery.  You may call to check if you do not hear from us after three days.   WHEN TO CALL YOUR DOCTOR: Fever over 101.0 Nausea and/or vomiting. Extreme swelling or bruising. Continued bleeding from incision. Increased pain, redness, or drainage from the incision.  The clinic staff is available to answer your questions during regular business hours.  Please don't hesitate to call and ask to speak to one of the nurses for clinical concerns.  If you have a medical emergency, go to the nearest emergency room or call 911.  A surgeon from [facility] is always on call at the hospital.  For further questions, please visit [email or web address]   

## 2021-08-30 ENCOUNTER — Encounter (HOSPITAL_COMMUNITY): Payer: Self-pay | Admitting: General Surgery

## 2021-08-30 LAB — SURGICAL PATHOLOGY

## 2021-08-30 NOTE — Anesthesia Postprocedure Evaluation (Signed)
Anesthesia Post Note  Patient: Diane Miranda  Procedure(s) Performed: RIGHT BREAST LUMPECTOMY WITH RADIOACTIVE SEED AND RIGHT AXILLARY SENTINEL LYMPH NODE BIOPSY (Right: Breast) RADIOACTIVE SEED GUIDED AXILLARY SENTINEL LYMPH NODE EXCISION (Right: Breast)     Patient location during evaluation: PACU Anesthesia Type: General Level of consciousness: awake and alert Pain management: pain level controlled Vital Signs Assessment: post-procedure vital signs reviewed and stable Respiratory status: spontaneous breathing, nonlabored ventilation, respiratory function stable and patient connected to nasal cannula oxygen Cardiovascular status: blood pressure returned to baseline and stable Postop Assessment: no apparent nausea or vomiting Anesthetic complications: no   No notable events documented.  Last Vitals:  Vitals:   08/29/21 1223 08/29/21 1238  BP: 109/74 114/76  Pulse: 77 78  Resp: (!) 21 (!) 21  Temp:  (!) 36.4 C  SpO2: 96% 99%    Last Pain:  Vitals:   08/29/21 1153  TempSrc:   PainSc: [place]

## 2021-09-05 ENCOUNTER — Encounter: Payer: Self-pay | Admitting: *Deleted

## 2021-09-10 NOTE — Progress Notes (Signed)
[facility]  [phone] [phone]     ID: Diane Miranda DOB: 09-18-1976  MR#: [id number]  YHC#:[id number]  Patient Care Team: Gilda Crease, MD as PCP - General (Internal Medicine) Pershing Proud, RN as Oncology Nurse Navigator Donnelly Angelica, RN as Oncology Nurse Navigator Almond Lint, MD as Consulting Physician (General Surgery) Anysha Frappier, Valentino Hue, MD as Consulting Physician (Oncology) Antony Blackbird, MD as Consulting Physician (Radiation Oncology) Toni Arthurs, MD as Consulting Physician (Orthopedic Surgery) Lowella Dell, MD OTHER MD:  CHIEF COMPLAINT: triple positive breast cancer  CURRENT TREATMENT: anti-HER2 treatment   INTERVAL HISTORY: Diane Miranda returns today for follow up and treatment of her triple positive breast cancer.  She continues on anti-HER2 treatment every 21 days to complete a year.  She is tolerating this remarkably well, and she tells me her sense of taste is finally coming back.  Since her last visit, she underwent right lumpectomy and right axillary sentinel lymph node biopsy on 08/29/21 under Dr. Donell Beers. Pathology from the procedure [id number]) showed: microscopic foci of ductal carcinoma in situ, high grade; no evidence of residual invasive carcinoma; resection margins negative.  All 5 removed lymph nodes were negative for carcinoma (0/5).  She is already scheduled for reconsult with Dr. Trenton Founds to 10/01/2021   REVIEW OF SYSTEMS: Diane Miranda tolerated her surgery well, with no unusual pain fever bleeding or other complications.  She has minimal peripheral neuropathy involving her toes.  She has not at all involving her fingers.  She does have some pain in her legs which is new.  She still has a little bit of tearing.  Aside from this a detailed review of systems today was stable.   COVID 19 VACCINATION STATUS: refuses vaccination; infection 10/2019   HISTORY OF CURRENT ILLNESS: From the original  intake note:  Diane Miranda herself palpated a right breast mass and noted associated pain. She underwent bilateral diagnostic mammography with tomography and right breast ultrasonography at [facility] on 02/15/2021 showing: breast density category B; palpable 5.4 cm mass within outer right breast; two abnormal right axillary lymph nodes; 1.8 cm indeterminate hypoechoic area within upper-outer right breast; no evidence of left breast malignancy.  Accordingly on 02/23/2021 she proceeded to biopsy of the right breast areas in question. The pathology from this procedure ([id number]) showed:  1. Right Breast, 11 o'clock  - adenosis and columnar cell hyperplasia; this was concordant 2. Right Breast, 9 o'clock  - invasive ductal carcinoma, grade 3  - Prognostic indicators significant for: estrogen receptor, 40% positive with moderate staining intensity and progesterone receptor, 60% positive with strong staining intensity. Proliferation marker Ki67 at 35%. HER2 positive by immunohistochemistry (3+). 3. Right Axilla, lymph node  - invasive ductal carcinoma involving nodal tissue  She underwent breast MRI on 03/04/2021 showing: breast composition B; 4.7 cm mass in 8-9 o'clock position of posterior right breast represents recently diagnosed malignancy; two adjacent abnormal right axillary lymph nodes, one of which represents the biopsy-proven metastatic node; significant skin thickening in periareolar right breast, with significant increased T2 signal consistent with edema; mild abnormal enhancement within thickened skin, nonspecific.  There was also a 1.1 cm nonspecific enhancing mass in left breast at 2-3 o'clock.  Cancer Staging Malignant neoplasm of upper-outer quadrant of right breast in female, estrogen receptor positive (HCC) Staging form: Breast, AJCC 8th Edition - Clinical: Stage IIB (cT3, cN1, cM0, G3, ER+, PR+, HER2+) - Signed by Lowella Dell, MD on 03/07/2021 Histologic grading  system: 3 grade system  The patient's subsequent history is as detailed below.   PAST MEDICAL HISTORY: Past Medical History:  Diagnosis Date   Allergy 03/04/99   Asthma    as child   Breast cancer ([place])    Family history of lung cancer 03/08/2021   Family history of uterine cancer 03/08/2021   GERD (gastroesophageal reflux disease)    Headache    History of COVID-19    Hyperlipidemia    Hypertension    Pre-diabetes    Prediabetes    right breast ca dx'd 2021/03/03   Trimalleolar fracture of ankle, closed, left, initial encounter     PAST SURGICAL HISTORY: Past Surgical History:  Procedure Laterality Date   BREAST BIOPSY Right 03/22/2021   Procedure: PUNCH BIOPSY OF RIGHT AREOLA;  Surgeon: Stark Klein, MD;  Location: [place];  Service: General;  Laterality: Right;   BREAST LUMPECTOMY WITH RADIOACTIVE SEED AND SENTINEL LYMPH NODE BIOPSY Right 08/29/2021   Procedure: RIGHT BREAST LUMPECTOMY WITH RADIOACTIVE SEED AND RIGHT AXILLARY SENTINEL LYMPH NODE BIOPSY;  Surgeon: Stark Klein, MD;  Location: [place];  Service: General;  Laterality: Right;   FRACTURE SURGERY  2017-03-03   NO PAST SURGERIES     ORIF ANKLE FRACTURE Left 07/10/2017   Procedure: OPEN REDUCTION INTERNAL FIXATION (ORIF) trimallolar ANKLE FRACTURE;  Surgeon: Wylene Simmer, MD;  Location: [place];  Service: Orthopedics;  Laterality: Left;   PORTACATH PLACEMENT Left 03/22/2021   Procedure: INSERTION PORT-A-CATH;  Surgeon: Stark Klein, MD;  Location: [place];  Service: General;  Laterality: Left;   RADIOACTIVE SEED GUIDED AXILLARY SENTINEL LYMPH NODE Right 08/29/2021   Procedure: RADIOACTIVE SEED GUIDED AXILLARY SENTINEL LYMPH NODE EXCISION;  Surgeon: Stark Klein, MD;  Location: [place];  Service: General;  Laterality: Right;    FAMILY HISTORY: Family History  Problem Relation Age of Onset   Diabetes Father    Hypertension Father    Early death Mother    Heart disease Mother     Hypertension Mother    Hearing loss Maternal Grandmother    Hypertension Maternal Grandmother    Uterine cancer Other 8       MGM's sister   Throat cancer Other        MGM's brother; dx after 61   Lung cancer Other        PGM's sister; dx 26s   Diabetes Brother    Her father is living at age 54 as of 03-03-21. Her mother died at age 18 from [place]. Diane Miranda has two brothers (and no sisters). She reports endometrial cancer in a maternal great aunt and lung cancer in a paternal aunt. There is no other family history of cancer to her knowledge.   GYNECOLOGIC HISTORY:  No LMP recorded. Menarche: 45 years old Age at first live birth: 45 years old [place] P 2 LMP early 2021/03/03 (has since had IUD placed) Contraceptive: IUD in place [place]), previously used pills from 1996-2006 HRT n/a  Hysterectomy? no BSO? 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 She is single. She lives at home with daughter Diane Miranda (age 24), adopted daughter Diane Miranda (age 49), and the patient's maternal grandmother, who has significant dementia issues. Daughter Diane Miranda, age 57, works in Press photographer in [place]. Tamberly is a Restaurant manager, fast food.    ADVANCED DIRECTIVES: not in place; however the patient made it clear during her 03/07/2021 visit that  she would not accept blood product transfusions for any reason including immediately life saving reasons   HEALTH MAINTENANCE: Social History   Tobacco Use   Smoking status: Never   Smokeless tobacco: Never  Vaping Use   Vaping Use: Never used  Substance Use Topics   Alcohol use: Yes    Comment: social   Drug use: No     Colonoscopy: n/a (age)  PAP: 2020  Bone density: n/a (age)   Allergies  Allergen Reactions   Hydrochlorothiazide Anaphylaxis and Swelling    Current Outpatient Medications  Medication Sig Dispense Refill   acetaminophen (TYLENOL) 500 MG tablet Take 1 tablet (500 mg  total) by mouth 3 (three) times daily with meals as needed. Take with aleve 220 mg tablet 90 tablet 0   Fexofenadine HCl (ALLEGRA ALLERGY PO) Take 1 tablet by mouth daily.     gabapentin (NEURONTIN) 300 MG capsule Take 1 capsule (300 mg total) by mouth at bedtime. 90 capsule 4   lidocaine-prilocaine (EMLA) cream Apply 1 application topically as needed [facility]).     loratadine (CLARITIN) 10 MG tablet Take 1 tablet (10 mg total) by mouth daily. 90 tablet 4   losartan (COZAAR) 25 MG tablet Take 1 tablet (25 mg total) by mouth every morning. 90 tablet 0   metoprolol succinate (TOPROL XL) 50 MG 24 hr tablet Take 1 tablet (50 mg total) by mouth every morning. 90 tablet 0   Multiple Vitamins-Minerals (MULTIVITAMIN WITH MINERALS) tablet Take 1 tablet by mouth daily.     ondansetron (ZOFRAN) 8 MG tablet Take 8 mg by mouth every 8 (eight) hours as needed for nausea or vomiting.     oxyCODONE (OXY IR/ROXICODONE) 5 MG immediate release tablet Take 1 tablet (5 mg total) by mouth every 6 (six) hours as needed for severe pain. 10 tablet 0   pantoprazole (PROTONIX) 20 MG tablet Take 20 mg by mouth daily.     prochlorperazine (COMPAZINE) 10 MG tablet Take 10 mg by mouth every 6 (six) hours as needed for nausea or vomiting.     rosuvastatin (CRESTOR) 10 MG tablet Take 10 mg by mouth daily.     tobramycin-dexamethasone (TOBRADEX) ophthalmic solution Place 1 drop into both eyes in the morning and at bedtime. 5 mL 2   No current facility-administered medications for this visit.    OBJECTIVE: African-American woman who appears stated age  76:   09/11/21 0828  BP: (!) 142/83  Pulse: 91  Temp: 97.7 F (36.5 C)  SpO2: 99%      Body mass index is 38.67 kg/m.   Wt Readings from Last 3 Encounters:  09/11/21 225 lb 4.8 oz (102.2 kg)  08/29/21 215 lb (97.5 kg)  08/24/21 224 lb (101.6 kg)     ECOG FS:1 - Symptomatic but completely ambulatory  Sclerae unicteric, EOMs intact Wearing a mask No cervical or  supraclavicular adenopathy Lungs no rales or rhonchi Heart regular rate and rhythm Abd soft, nontender, positive bowel sounds MSK no focal spinal tenderness, no upper extremity lymphedema Neuro: nonfocal, well oriented, appropriate affect Breasts: The right breast is status post lumpectomy.  The incisions are healing very nicely.  The cosmetic result is excellent.  The left breast and both axillae are benign.    LAB RESULTS:  CMP     Component Value Date/Time   NA 145 09/11/2021 0814   K 3.8 09/11/2021 0814   CL 110 09/11/2021 0814   CO2 24 09/11/2021 0814   GLUCOSE 118 (H)  09/11/2021 0814   GLUCOSE 105 (H) 11/12/2006 1022   BUN 9 09/11/2021 0814   CREATININE 0.85 09/11/2021 0814   CREATININE 0.92 03/07/2021 1219   CALCIUM 9.0 09/11/2021 0814   PROT 6.2 (L) 09/11/2021 0814   ALBUMIN 3.4 (L) 09/11/2021 0814   AST 13 (L) 09/11/2021 0814   AST 13 (L) 03/07/2021 1219   ALT 10 09/11/2021 0814   ALT 16 03/07/2021 1219   ALKPHOS 57 09/11/2021 0814   BILITOT 0.3 09/11/2021 0814   BILITOT 0.6 03/07/2021 1219   GFRNONAA >60 09/11/2021 0814   GFRNONAA >60 03/07/2021 1219   GFRAA >90 01/30/2015 1319    No results found for: TOTALPROTELP, ALBUMINELP, A1GS, A2GS, BETS, BETA2SER, GAMS, MSPIKE, SPEI  Lab Results  Component Value Date   WBC 5.6 09/11/2021   NEUTROABS 3.6 09/11/2021   HGB 10.9 (L) 09/11/2021   HCT 31.1 (L) 09/11/2021   MCV 99.7 09/11/2021   PLT 228 09/11/2021    No results found for: LABCA2  No components found for: VMDMVS399  No results for input(s): INR in the last 168 hours.  No results found for: LABCA2  No results found for: ZSR787  No results found for: SZX014  No results found for: QPH427  No results found for: CA2729  No components found for: HGQUANT  No results found for: CEA1 / No results found for: CEA1   No results found for: AFPTUMOR  No results found for: CHROMOGRNA  No results found for: KPAFRELGTCHN, LAMBDASER,  KAPLAMBRATIO (kappa/lambda light chains)  No results found for: HGBA, HGBA2QUANT, HGBFQUANT, HGBSQUAN (Hemoglobinopathy evaluation)   No results found for: LDH  No results found for: IRON, TIBC, IRONPCTSAT (Iron and TIBC)  No results found for: FERRITIN  Urinalysis    Component Value Date/Time   COLORURINE YELLOW 01/30/2015 1343   APPEARANCEUR CLEAR 01/30/2015 1343   LABSPEC 1.011 01/30/2015 1343   PHURINE 7.5 01/30/2015 1343   GLUCOSEU NEGATIVE 01/30/2015 1343   HGBUR NEGATIVE 01/30/2015 1343   BILIRUBINUR NEGATIVE 01/30/2015 1343   KETONESUR NEGATIVE 01/30/2015 1343   PROTEINUR NEGATIVE 01/30/2015 1343   UROBILINOGEN 0.2 01/30/2015 1343   NITRITE NEGATIVE 01/30/2015 1343   LEUKOCYTESUR SMALL (A) 01/30/2015 1343    STUDIES: NM Sentinel Node Inj-No Rpt (Breast)  Result Date: 08/29/2021 Sulfur Colloid was injected by the Nuclear Medicine Technologist for sentinel lymph node localization.   MM Breast Surgical Specimen  Result Date: 08/29/2021 CLINICAL DATA:  Right lumpectomy for grade 3 invasive ductal carcinoma in the 9 o'clock position of the breast seen on ultrasound-guided biopsy dated 02/23/2021, marked with a heart shaped biopsy marker clip. EXAM: SPECIMEN RADIOGRAPH OF THE RIGHT BREAST COMPARISON:  Previous exam(s). FINDINGS: Status post excision of the right breast. The radioactive seed and heart shaped biopsy marker clip are present, completely intact, and were marked for pathology. IMPRESSION: Specimen radiograph of the right breast. Electronically Signed   By: Beckie Salts M.D.   On: 08/29/2021 12:37  MM Breast Surgical Specimen  Result Date: 08/29/2021 CLINICAL DATA:  Evaluate specimens EXAM: SPECIMEN RADIOGRAPH OF THE RIGHT BREAST COMPARISON:  Previous exam(s). FINDINGS: Status post excision of the right breast. The radioactive seed is within a specimen container. There is no soft tissue or biopsy clip identified. IMPRESSION: The radioactive seed is within the  specimen container. There is no soft tissue or biopsy clip identified. Electronically Signed   By: Gerome Sam III M.D.   On: 08/29/2021 10:52  MM RT RADIOACTIVE SEED LOC MAMMO GUIDE  Result Date: 08/28/2021 CLINICAL DATA:  Localization right breast prior to surgery EXAM: MAMMOGRAPHIC GUIDED RADIOACTIVE SEED LOCALIZATION OF THE RIGHT BREAST COMPARISON:  Previous exam(s). FINDINGS: Patient presents for radioactive seed localization prior to surgery. I met with the patient and we discussed the procedure of seed localization including benefits and alternatives. We discussed the high likelihood of a successful procedure. We discussed the risks of the procedure including infection, bleeding, tissue injury and further surgery. We discussed the low dose of radioactivity involved in the procedure. Informed, written consent was given. The usual time-out protocol was performed immediately prior to the procedure. Using mammographic guidance, sterile technique, 1% lidocaine and an I-125 radioactive seed, the low axillary previously biopsied right lymph node and associated biopsy clip was localized using a lateral approach. The follow-up mammogram images confirm the seed in the expected location and were marked for the surgeon. Follow-up survey of the patient confirms presence of the radioactive seed. Order number of I-125 seed:  [id number]. Total activity:  7.096 millicuries reference Date: May 25, 2021 The patient tolerated the procedure well and was released from the [place]. She was given instructions regarding seed removal. Using mammographic guidance, sterile technique, 1% lidocaine and an I-125 radioactive seed, the heart shaped clip marking the previously identified right breast cancer was localized using a lateral approach. The follow-up mammogram images confirm the seed in the expected location and were marked for the surgeon. Follow-up survey of the patient confirms presence of the radioactive seed.  Order number of I-125 seed:  [id number]. Total activity:  3.754 millicurie reference Date: May 25, 2021 IMPRESSION: Radioactive seed localization right breast. No apparent complications. Electronically Signed   By: Dorise Bullion III M.D.   On: 08/28/2021 14:12  MM RT RADIO SEED EA ADD LESION LOC MAMMO  Result Date: 08/28/2021 CLINICAL DATA:  Localization right breast prior to surgery EXAM: MAMMOGRAPHIC GUIDED RADIOACTIVE SEED LOCALIZATION OF THE RIGHT BREAST COMPARISON:  Previous exam(s). FINDINGS: Patient presents for radioactive seed localization prior to surgery. I met with the patient and we discussed the procedure of seed localization including benefits and alternatives. We discussed the high likelihood of a successful procedure. We discussed the risks of the procedure including infection, bleeding, tissue injury and further surgery. We discussed the low dose of radioactivity involved in the procedure. Informed, written consent was given. The usual time-out protocol was performed immediately prior to the procedure. Using mammographic guidance, sterile technique, 1% lidocaine and an I-125 radioactive seed, the low axillary previously biopsied right lymph node and associated biopsy clip was localized using a lateral approach. The follow-up mammogram images confirm the seed in the expected location and were marked for the surgeon. Follow-up survey of the patient confirms presence of the radioactive seed. Order number of I-125 seed:  [id number]. Total activity:  0.352 millicuries reference Date: May 25, 2021 The patient tolerated the procedure well and was released from the [place]. She was given instructions regarding seed removal. Using mammographic guidance, sterile technique, 1% lidocaine and an I-125 radioactive seed, the heart shaped clip marking the previously identified right breast cancer was localized using a lateral approach. The follow-up mammogram images confirm the seed in the expected  location and were marked for the surgeon. Follow-up survey of the patient confirms presence of the radioactive seed. Order number of I-125 seed:  [id number]. Total activity:  1.121 millicurie reference Date: May 25, 2021 IMPRESSION: Radioactive seed localization right breast. No apparent complications. Electronically Signed   By: Dorise Bullion  III M.D.   On: 08/28/2021 14:12    ELIGIBLE FOR AVAILABLE RESEARCH PROTOCOL:no  ASSESSMENT: 45 y.o. [place] woman status post right breast upper outer quadrant biopsy 02/23/2021 for a clinical T3 N1, stage IIB invasive ductal carcinoma, grade 3, triple positive, with an MIB-1 of 35%  (a) right periareolar biopsy 02/23/2021 was benign/concordant  (b) left breast nodule biopsy 03/14/2021 benign  (c) Punch biopsy of the right areolar area 03/22/2021 no malignancy  (d) CT of the chest abdomen and pelvis 03/20/2021 shows no evidence of metastatic disease; there is 2.5 cm cystic pancreatic mass and a 0.4 cm right liver lobe lesion requiring further follow-up  (e) bone scan 03/25/2021 shows no bone metastases  (1) genetics testing 03/26/2021 through the [employer]RNA[employer] panel offered by [facility] found no deleterious mutations in APC, ATM, AXIN2, BARD1, BMPR1A, BRCA1, BRCA2, BRIP1, CDH1, CDK4, CDKN2A, CHEK2, DICER1, EPCAM, GREM1, HOXB13, MEN1, MLH1, MSH2, MSH3, MSH6, MUTYH, NBN, NF1, NF2, NTHL1, PALB2, PMS2, POLD1, POLE, PTEN, RAD51C, RAD51D, RECQL, RET, SDHA, SDHAF2, SDHB, SDHC, SDHD, SMAD4, SMARCA4, STK11, TP53, TSC1, TSC2, and VHL.  RNA data is routinely analyzed for use in variant interpretation for all genes.  (2) neoadjuvant chemotherapy consisting of carboplatin, docetaxel, trastuzumab and pertuzumab every 21 days x 6 beginning 03/28/2021, completed 07/10/2021  (3) trastuzumab and pertuzumab to continue to total 1 year  (a) echo 03/13/2021 showed an ejection fraction in the 60- 65% range  (b) echo 06/27/2021 showed an ejection fraction  in the 65-70%  (4) right lumpectomy and sentinel lymph node sampling 08/29/2021 showed no residual invasive disease in the breast or lymph nodes, rare foci of residual DCIS the largest measuring 0.1 cm, with negative margins: ypT(is) ypN0  (a) 5 right  axillary lymph nodes were removed  (5) adjuvant radiation pending  (6) antiestrogens to follow   PLAN: Akisha did very well with her surgery.  She is recovering from the side effects of chemo but still has a little bit of tearing a little bit of neuropathy in her toes and some pain in her legs.  All this will be fading away over the next several weeks.  She is already scheduled for physical therapy and for radiation.  When she sees me in November we will set her up for follow-up studies in December for her liver and pancreatic indeterminate findings which are going to be benign but do require follow-up.  We will do an MRI of the abdomen to follow-up that probably on a yearly basis for a couple of years at least.  I went over her pathology in detail so she understands she has an excellent long-term prognosis.  She will be due for repeat echo sometime in October.  That order has been placed  Total encounter time 25 minutes.Sarajane Jews C. Rondell Frick, MD 09/11/2021 8:51 AM Medical Oncology and Hematology [facility] [place], [place] [address] Tel. [phone]    Fax. [phone]   This document serves as a record of services personally performed by Lurline Del, MD. It was created on his behalf by Wilburn Mylar, a trained medical scribe. The creation of this record is based on the scribe's personal observations and the provider's statements to them.   I, Lurline Del MD, have reviewed the above documentation for accuracy and completeness, and I agree with the above.   *Total Encounter Time as defined by the Centers for Medicare and Medicaid Services includes, in addition to the face-to-face time of  a patient  visit (documented in the note above) non-face-to-face time: obtaining and reviewing outside history, ordering and reviewing medications, tests or procedures, care coordination (communications with other health care professionals or caregivers) and documentation in the medical record.

## 2021-09-11 ENCOUNTER — Inpatient Hospital Stay: Payer: No Typology Code available for payment source

## 2021-09-11 ENCOUNTER — Inpatient Hospital Stay (HOSPITAL_BASED_OUTPATIENT_CLINIC_OR_DEPARTMENT_OTHER): Payer: No Typology Code available for payment source | Admitting: Oncology

## 2021-09-11 ENCOUNTER — Other Ambulatory Visit: Payer: Self-pay

## 2021-09-11 VITALS — BP 151/94 | HR 86 | Temp 98.4°F | Resp 16

## 2021-09-11 VITALS — BP 142/83 | HR 91 | Temp 97.7°F | Wt 225.3 lb

## 2021-09-11 DIAGNOSIS — C50411 Malignant neoplasm of upper-outer quadrant of right female breast: Secondary | ICD-10-CM

## 2021-09-11 DIAGNOSIS — Z170 Estrogen receptor positive status [ER+]: Secondary | ICD-10-CM

## 2021-09-11 DIAGNOSIS — Z5111 Encounter for antineoplastic chemotherapy: Secondary | ICD-10-CM | POA: Diagnosis not present

## 2021-09-11 DIAGNOSIS — Z95828 Presence of other vascular implants and grafts: Secondary | ICD-10-CM

## 2021-09-11 HISTORY — DX: Presence of other vascular implants and grafts: Z95.828

## 2021-09-11 LAB — CBC WITH DIFFERENTIAL/PLATELET
Abs Immature Granulocytes: 0 10*3/uL (ref 0.00–0.07)
Basophils Absolute: 0 10*3/uL (ref 0.0–0.1)
Basophils Relative: 0 %
Eosinophils Absolute: 0.1 10*3/uL (ref 0.0–0.5)
Eosinophils Relative: 2 %
HCT: 31.1 % — ABNORMAL LOW (ref 36.0–46.0)
Hemoglobin: 10.9 g/dL — ABNORMAL LOW (ref 12.0–15.0)
Immature Granulocytes: 0 %
Lymphocytes Relative: 28 %
Lymphs Abs: 1.6 10*3/uL (ref 0.7–4.0)
MCH: 34.9 pg — ABNORMAL HIGH (ref 26.0–34.0)
MCHC: 35 g/dL (ref 30.0–36.0)
MCV: 99.7 fL (ref 80.0–100.0)
Monocytes Absolute: 0.3 10*3/uL (ref 0.1–1.0)
Monocytes Relative: 5 %
Neutro Abs: 3.6 10*3/uL (ref 1.7–7.7)
Neutrophils Relative %: 65 %
Platelets: 228 10*3/uL (ref 150–400)
RBC: 3.12 MIL/uL — ABNORMAL LOW (ref 3.87–5.11)
RDW: 12.5 % (ref 11.5–15.5)
WBC: 5.6 10*3/uL (ref 4.0–10.5)
nRBC: 0 % (ref 0.0–0.2)

## 2021-09-11 LAB — COMPREHENSIVE METABOLIC PANEL
ALT: 10 U/L (ref 0–44)
AST: 13 U/L — ABNORMAL LOW (ref 15–41)
Albumin: 3.4 g/dL — ABNORMAL LOW (ref 3.5–5.0)
Alkaline Phosphatase: 57 U/L (ref 38–126)
Anion gap: 11 (ref 5–15)
BUN: 9 mg/dL (ref 6–20)
CO2: 24 mmol/L (ref 22–32)
Calcium: 9 mg/dL (ref 8.9–10.3)
Chloride: 110 mmol/L (ref 98–111)
Creatinine, Ser: 0.85 mg/dL (ref 0.44–1.00)
GFR, Estimated: >60 mL/min (ref >60)
Glucose, Bld: 118 mg/dL — ABNORMAL HIGH (ref 70–99)
Potassium: 3.8 mmol/L (ref 3.5–5.1)
Sodium: 145 mmol/L (ref 135–145)
Total Bilirubin: 0.3 mg/dL (ref 0.3–1.2)
Total Protein: 6.2 g/dL — ABNORMAL LOW (ref 6.5–8.1)

## 2021-09-11 MED ORDER — ACETAMINOPHEN 325 MG PO TABS
650.0000 mg | ORAL_TABLET | Freq: Once | ORAL | Status: AC
  Administered 2021-09-11: 650 mg via ORAL
  Filled 2021-09-11: qty 2

## 2021-09-11 MED ORDER — SODIUM CHLORIDE 0.9% FLUSH
10.0000 mL | INTRAVENOUS | Status: DC | PRN
  Administered 2021-09-11: 10 mL

## 2021-09-11 MED ORDER — DIPHENHYDRAMINE HCL 25 MG PO CAPS
25.0000 mg | ORAL_CAPSULE | Freq: Once | ORAL | Status: AC
  Administered 2021-09-11: 25 mg via ORAL
  Filled 2021-09-11: qty 1

## 2021-09-11 MED ORDER — TRASTUZUMAB-ANNS CHEMO 150 MG IV SOLR
6.0000 mg/kg | Freq: Once | INTRAVENOUS | Status: AC
  Administered 2021-09-11: 609 mg via INTRAVENOUS
  Filled 2021-09-11: qty 29

## 2021-09-11 MED ORDER — SODIUM CHLORIDE 0.9% FLUSH
10.0000 mL | Freq: Once | INTRAVENOUS | Status: AC
  Administered 2021-09-11: 10 mL

## 2021-09-11 MED ORDER — SODIUM CHLORIDE 0.9 % IV SOLN
Freq: Once | INTRAVENOUS | Status: AC
Start: 2021-09-11 — End: 2021-09-11

## 2021-09-11 MED ORDER — HEPARIN SOD (PORK) LOCK FLUSH 100 UNIT/ML IV SOLN
500.0000 [IU] | Freq: Once | INTRAVENOUS | Status: AC | PRN
  Administered 2021-09-11: 500 [IU]

## 2021-09-11 MED ORDER — SODIUM CHLORIDE 0.9 % IV SOLN
420.0000 mg | Freq: Once | INTRAVENOUS | Status: AC
  Administered 2021-09-11: 420 mg via INTRAVENOUS
  Filled 2021-09-11: qty 14

## 2021-09-11 NOTE — Patient Instructions (Signed)
[facility]  Discharge Instructions: Thank you for choosing [facility] to provide your oncology and hematology care.   If you have a lab appointment with the [facility], please go directly to the [facility] and check in at the registration area.   Wear comfortable clothing and clothing appropriate for easy access to any Portacath or PICC line.   We strive to give you quality time with your provider. You may need to reschedule your appointment if you arrive late (15 or more minutes).  Arriving late affects you and other patients whose appointments are after yours.  Also, if you miss three or more appointments without notifying the office, you may be dismissed from the clinic at the provider's discretion.      For prescription refill requests, have your pharmacy contact our office and allow 72 hours for refills to be completed.    Today you received the following chemotherapy and/or immunotherapy agents Trastuzumab and perjeta      To help prevent nausea and vomiting after your treatment, we encourage you to take your nausea medication as directed.  BELOW ARE SYMPTOMS THAT SHOULD BE REPORTED IMMEDIATELY: *FEVER GREATER THAN 100.4 F (38 C) OR HIGHER *CHILLS OR SWEATING *NAUSEA AND VOMITING THAT IS NOT CONTROLLED WITH YOUR NAUSEA MEDICATION *UNUSUAL SHORTNESS OF BREATH *UNUSUAL BRUISING OR BLEEDING *URINARY PROBLEMS (pain or burning when urinating, or frequent urination) *BOWEL PROBLEMS (unusual diarrhea, constipation, pain near the anus) TENDERNESS IN MOUTH AND THROAT WITH OR WITHOUT PRESENCE OF ULCERS (sore throat, sores in mouth, or a toothache) UNUSUAL RASH, SWELLING OR PAIN  UNUSUAL VAGINAL DISCHARGE OR ITCHING   Items with * indicate a potential emergency and should be followed up as soon as possible or go to the Emergency Department if any problems should occur.  Please show the CHEMOTHERAPY ALERT CARD or IMMUNOTHERAPY ALERT CARD at  check-in to the Emergency Department and triage nurse.  Should you have questions after your visit or need to cancel or reschedule your appointment, please contact [facility]  Dept: [phone]  and follow the prompts.  Office hours are 8:00 a.m. to 4:30 p.m. Monday - Friday. Please note that voicemails left after 4:00 p.m. may not be returned until the following business day.  We are closed weekends and major holidays. You have access to a nurse at all times for urgent questions. Please call the main number to the clinic Dept: [phone] and follow the prompts.   For any non-urgent questions, you may also contact your provider using MyChart. We now offer e-Visits for anyone 44 and older to request care online for non-urgent symptoms. For details visit [email or web address].   Also download the MyChart app! Go to the app store, search "MyChart", open the app, select [facility], and log in with your MyChart username and password.  Due to Covid, a mask is required upon entering the hospital/clinic. If you do not have a mask, one will be given to you upon arrival. For doctor visits, patients may have 1 support person aged 44 or older with them. For treatment visits, patients cannot have anyone with them due to current Covid guidelines and our immunocompromised population.

## 2021-09-11 NOTE — Progress Notes (Signed)
Pt declined full 30 minutes of post perjeta OBS. VSS at discharge.

## 2021-09-24 ENCOUNTER — Other Ambulatory Visit: Payer: Self-pay

## 2021-09-24 ENCOUNTER — Ambulatory Visit: Payer: No Typology Code available for payment source | Attending: General Surgery | Admitting: Physical Therapy

## 2021-09-24 ENCOUNTER — Encounter: Payer: Self-pay | Admitting: Physical Therapy

## 2021-09-24 ENCOUNTER — Other Ambulatory Visit: Payer: Self-pay | Admitting: *Deleted

## 2021-09-24 DIAGNOSIS — R600 Localized edema: Secondary | ICD-10-CM | POA: Diagnosis present

## 2021-09-24 DIAGNOSIS — Z170 Estrogen receptor positive status [ER+]: Secondary | ICD-10-CM | POA: Insufficient documentation

## 2021-09-24 DIAGNOSIS — R293 Abnormal posture: Secondary | ICD-10-CM | POA: Diagnosis present

## 2021-09-24 DIAGNOSIS — Z483 Aftercare following surgery for neoplasm: Secondary | ICD-10-CM | POA: Diagnosis present

## 2021-09-24 DIAGNOSIS — C50411 Malignant neoplasm of upper-outer quadrant of right female breast: Secondary | ICD-10-CM

## 2021-09-24 NOTE — Therapy (Signed)
[facility] @ [place], [place], [address] Phone: [phone]   Fax:  [phone]  Physical Therapy Treatment  Patient Details  Name: Diane Miranda MRN: [id number] Date of Birth: [birth year] Referring Provider (PT): Dr. Stark Klein   Encounter Date: 09/24/2021   PT End of Session - 09/24/21 1509     Visit Number 1    Number of Visits 1    PT Start Time 1311    PT Stop Time 1400    PT Time Calculation (min) 49 min    Activity Tolerance Patient tolerated treatment well    Behavior During Therapy [facility] for tasks assessed/performed             Past Medical History:  Diagnosis Date   Allergy 2000   Asthma    as child   Breast cancer ([place])    Family history of lung cancer 03/08/2021   Family history of uterine cancer 03/08/2021   GERD (gastroesophageal reflux disease)    Headache    History of COVID-19    Hyperlipidemia    Hypertension    Pre-diabetes    Prediabetes    right breast ca dx'd 02/2021   Trimalleolar fracture of ankle, closed, left, initial encounter     Past Surgical History:  Procedure Laterality Date   BREAST BIOPSY Right 03/22/2021   Procedure: PUNCH BIOPSY OF RIGHT AREOLA;  Surgeon: Stark Klein, MD;  Location: [place];  Service: General;  Laterality: Right;   BREAST LUMPECTOMY WITH RADIOACTIVE SEED AND SENTINEL LYMPH NODE BIOPSY Right 08/29/2021   Procedure: RIGHT BREAST LUMPECTOMY WITH RADIOACTIVE SEED AND RIGHT AXILLARY SENTINEL LYMPH NODE BIOPSY;  Surgeon: Stark Klein, MD;  Location: [place];  Service: General;  Laterality: Right;   FRACTURE SURGERY  2018   NO PAST SURGERIES     ORIF ANKLE FRACTURE Left 07/10/2017   Procedure: OPEN REDUCTION INTERNAL FIXATION (ORIF) trimallolar ANKLE FRACTURE;  Surgeon: Wylene Simmer, MD;  Location: [place];  Service: Orthopedics;  Laterality: Left;   PORTACATH PLACEMENT Left 03/22/2021   Procedure:  INSERTION PORT-A-CATH;  Surgeon: Stark Klein, MD;  Location: [place];  Service: General;  Laterality: Left;   RADIOACTIVE SEED GUIDED AXILLARY SENTINEL LYMPH NODE Right 08/29/2021   Procedure: RADIOACTIVE SEED GUIDED AXILLARY SENTINEL LYMPH NODE EXCISION;  Surgeon: Stark Klein, MD;  Location: [place];  Service: General;  Laterality: Right;    There were no vitals filed for this visit.   Subjective Assessment - 09/24/21 1313     Subjective Patient reports she underwent neoadjuvant chemotherapy 03/28/2021 - 07/10/2021 and is continuing with Herceptin. She underwent a right lumpectomy and sentinel node biopsy (5 negative nodes) on 08/29/2021.    Pertinent History Patient was diagnosed on 02/15/2021 with right grade III invasive ductal carcinoma breast cancer. She underwent a right lumpectomy and sentinel node biopsy (5 negative nodes) on 08/29/2021 It is triple positive with a Ki67 of 35%. She had an axillary lymph node biopsied and it was found to be positive.    Patient Stated Goals See if we can improve my arm    Currently in Pain? Yes    Pain Score 6     Pain Location Arm    Pain Orientation Right    Pain Descriptors / Indicators Aching;Sore    Pain Type Surgical pain    Pain Onset 1 to 4 weeks ago    Pain Frequency Intermittent  Aggravating Factors  Reaching    Pain Relieving Factors Resting                [facility] PT Assessment - 09/24/21 0001       Assessment   Medical Diagnosis s/p right lumpectomy and SLNB    Referring Provider (PT) Dr. Stark Klein    Onset Date/Surgical Date 08/29/21    Hand Dominance Right    Prior Therapy None      Precautions   Precautions Other (comment)    Precaution Comments recent surgery; right arm lymphedema risk      Restrictions   Weight Bearing Restrictions No      Balance Screen   Has the patient fallen in the past 6 months No    Has the patient had a decrease in activity level because of a fear of falling?  No    Is  the patient reluctant to leave their home because of a fear of falling?  No      Home Environment   Living Environment Private residence    Living Arrangements Other relatives   Grandmother, teenage kids 25 and 57 y.o.   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 Moderate edema present in right breast (compression bra recommended).      Posture/Postural Control   Posture/Postural Control Postural limitations    Postural Limitations Rounded Shoulders;Forward head      ROM / Strength   AROM / PROM / Strength AROM      AROM   AROM Assessment Site Shoulder    Right/Left Shoulder Right    Right Shoulder Extension 63 Degrees    Right Shoulder Flexion 148 Degrees    Right Shoulder ABduction 141 Degrees    Right Shoulder Internal Rotation 76 Degrees    Right Shoulder External Rotation 88 Degrees               LYMPHEDEMA/ONCOLOGY QUESTIONNAIRE - 09/24/21 0001       Type   Cancer Type Right breast cancer      Surgeries   Lumpectomy Date 08/29/21    Sentinel Lymph Node Biopsy Date 08/29/21    Number Lymph Nodes Removed 5      Treatment   Active Chemotherapy Treatment No    Past Chemotherapy Treatment Yes    Date 07/10/21    Active Radiation Treatment No    Past Radiation Treatment No    Current Hormone Treatment No    Past Hormone Therapy No      What other symptoms do you have   Are you Having Heaviness or Tightness Yes    Are you having Pain Yes    Are you having pitting edema No    Is it Hard or Difficult finding clothes that fit No    Do you have infections No     Is there Decreased scar mobility Yes      Lymphedema Assessments   Lymphedema Assessments Upper extremities      Right Upper Extremity Lymphedema   10 cm Proximal  to Olecranon Process 41.3 cm    Olecranon Process 29.3 cm    10 cm Proximal to Ulnar Styloid Process 27.1 cm    Just Proximal to Ulnar Styloid Process 18 cm    Across Hand at [employer] 20.3 cm    At [place] of 2nd Digit 6.6 cm      Left Upper Extremity Lymphedema   10 cm Proximal to Olecranon Process 41.2 cm    Olecranon Process 29.5 cm    10 cm Proximal to Ulnar Styloid Process 26.4 cm    Just Proximal to Ulnar Styloid Process 17.6 cm    Across Hand at [employer] 19.7 cm    At [place] of 2nd Digit 6.7 cm             L-DEX FLOWSHEETS - 09/24/21 1300       L-DEX LYMPHEDEMA SCREENING   Measurement Type Unilateral    L-DEX MEASUREMENT EXTREMITY Upper Extremity    POSITION  Standing    DOMINANT SIDE Right    At Risk Side Right    BASELINE SCORE (UNILATERAL) -5.2   Measurement taken 03/07/2021 at eval              Quick Dash - 09/24/21 0001     Open a tight or new jar Mild difficulty    Do heavy household chores (wash walls, wash floors) Mild difficulty    Carry a shopping bag or briefcase Mild difficulty    Wash your back Mild difficulty    Use a knife to cut food Mild difficulty    Recreational activities in which you take some force or impact through your arm, shoulder, or hand (golf, hammering, tennis) Mild difficulty    During the past week, to what extent has your arm, shoulder or hand problem interfered with your normal social activities with family, friends, neighbors, or groups? Slightly    During the past week, to what extent has your arm, shoulder or hand problem limited your work or other regular daily activities Slightly    Arm, shoulder, or hand pain. Mild    Tingling (pins and needles) in your arm, shoulder, or hand None    Difficulty Sleeping Mild difficulty    DASH Score 22.73 %                              PT Education - 09/24/21 1356     Education Details Lymphedema education; aftercare; scar massage    Person(s) Educated Patient    Methods Explanation;Demonstration;Handout    Comprehension Returned demonstration;Verbalized understanding                 PT Long Term Goals - 09/24/21 1513       PT LONG TERM GOAL #1   Title Patient will demonstrate she has regained full shoulder ROM and function post operatively compared to baselines.    Time 6    Period Months    Status Partially Met                   Plan - 09/24/21 1510     Clinical Impression Statement Patient is doing well s/p right lumpectomy and sentinel node biopsy. She had 5 nodes removed (all negative) during surgery on 08/29/2021. She has almost regained full shoulder ROM, function, and has no signs of lymphedema. She has moderate edema present in her right breast and will benefit from a hugger  compression bra (info on obtaining that issued today). She plans to work on the stretches given today and if her edema persists, she will contact us to begin PT. Otherwise, no PT needs at this time.    Stability/Clinical Decision Making Stable/Uncomplicated    Clinical Decision Making Low    Rehab Potential Excellent    PT Frequency One time visit    PT Treatment/Interventions ADLs/Self Care Home Management;Therapeutic exercise;Patient/family education    PT Next Visit Plan D/C - continue SOZO screens    PT Home Exercise Plan HEP    Consulted and Agree with Plan of Care Patient             Patient will benefit from skilled therapeutic intervention in order to improve the following deficits and impairments:  Postural dysfunction, Decreased range of motion, Decreased knowledge of precautions, Impaired UE functional use, Pain  Visit Diagnosis: Malignant neoplasm of upper-outer quadrant of right breast in female, estrogen receptor positive ([place])  Abnormal  posture  Aftercare following surgery for neoplasm  Localized edema     Problem List Patient Active Problem List   Diagnosis Date Noted   Port-A-Cath in place 09/11/2021   History of COVID-19    Genetic testing 03/27/2021   Family history of uterine cancer 03/08/2021   Family history of lung cancer 03/08/2021   Malignant neoplasm of upper-outer quadrant of right breast in female, estrogen receptor positive ([place]) 03/01/2021   SINUSITIS - ACUTE-NOS 09/07/2009   EXUDATIVE PHARYNGITIS 09/07/2009   LOW HDL 08/03/2008   HSV 08/01/2008   OBESITY 08/01/2008   ALLERGIC RHINITIS 08/01/2008   VAGINITIS 08/01/2008   HEADACHE 08/01/2008   PHYSICAL THERAPY DISCHARGE SUMMARY  Visits from Start of Care: 2  Current functional level related to goals / functional outcomes: Goals partially met. Some mild shoulder ROM limitations persist.   Remaining deficits: Mild shoulder ROM deficits and right breast edema.   Education / Equipment: HEP  Patient agrees to discharge. Patient goals were met. Patient is being discharged due to meeting the stated rehab goals.  Annia Friendly, [place] 09/24/21 3:14 PM    [facility] @ [place], [place], [address] Phone: [phone]   Fax:  [phone]  Name: Diane Miranda MRN: [id number] Date of Birth: 05/15/1976

## 2021-09-24 NOTE — Patient Instructions (Addendum)
     [facility]  [address]  [place] [address]  [phone]  After Breast Cancer Class It is recommended you attend the ABC class to be educated on lymphedema risk reduction. This class is free of charge and lasts for 1 hour. It is a 1-time class. You are scheduled for October 17th. You need to download the Webex app and we will send you a link.  Scar massage You can begin gentle scar massage when the glue is all off the incision. You can use coconut oil a few minutes each day massaging the scars.  Compression garment You could get the hugger bra to help reduce breast swelling. Call us if you still have swelling in mid November.  Home exercise Program Continue doing the stretches until you feel no tightness at the end and begin the new stretches (see below).  Follow up PT: It is recommended you return every 3 months for the first 3 years following surgery to be assessed on the SOZO machine for an L-Dex score. This helps prevent clinically significant lymphedema in 95% of patients. These follow up screens are 10 minute appointments that you are not billed for. You are scheduled for Dec. 27, 2022 at 11:00.  Closed Chain: Shoulder Abduction / Adduction - on Wall    One hand on wall, step to side and return. Stepping causes shoulder to abduct and adduct. Step _5__ times, holding 5 seconds, repeat 5 times, _2_ times per day.  http://ss.exer.us/267   Copyright  VHI. All rights reserved.  Closed Chain: Shoulder Flexion / Extension - on Wall    Hands on wall, step backward. Return. Stepping causes shoulder flexion and extension Step _5__ times, holding 5 seconds, repeat 5 times, _2_ times per day.  http://ss.exer.us/265   Copyright  VHI. All rights reserved.

## 2021-09-25 ENCOUNTER — Ambulatory Visit (HOSPITAL_COMMUNITY): Payer: No Typology Code available for payment source | Attending: Cardiovascular Disease

## 2021-09-25 DIAGNOSIS — Z0189 Encounter for other specified special examinations: Secondary | ICD-10-CM | POA: Diagnosis not present

## 2021-09-25 DIAGNOSIS — C50411 Malignant neoplasm of upper-outer quadrant of right female breast: Secondary | ICD-10-CM | POA: Diagnosis not present

## 2021-09-25 DIAGNOSIS — Z170 Estrogen receptor positive status [ER+]: Secondary | ICD-10-CM | POA: Diagnosis not present

## 2021-09-25 LAB — ECHOCARDIOGRAM COMPLETE
Area-P 1/2: 4.21 cm2
S' Lateral: 2.8 cm

## 2021-09-26 NOTE — Progress Notes (Signed)
Location of Breast Cancer: upper-outer quadrant of right breast  Histology per Pathology Report:    Receptor Status: Estrogen Receptor: 40%, positive, moderate staining intensity       Progesterone Receptor: 60%, positive, strong staining intensity       Her2: Positive (3+)       Ki-67: 35%   Did patient present with symptoms (if so, please note symptoms) or was this found on screening mammography?: herself palpated a right breast mass and noted associated pain  Past/Anticipated interventions by surgeon, if any:  Right Breast Radioactive seed localized lumpectomy and sentinel lymph node biopsy Surgeon: Diane Miranda   Past/Anticipated interventions by medical oncology, if any: Chemotherapy Diane Miranda - neoadjuvant chemotherapy consisting of carboplatin, docetaxel, trastuzumab and pertuzumab every 21 days x 6 beginning 03/28/2021, completed 07/10/2021   - trastuzumab and pertuzumab to continue to total 1 year  Lymphedema issues, if any:  no    Pain issues, if any:  no   SAFETY ISSUES: Prior radiation? no Pacemaker/ICD? no Possible current pregnancy?no, chemotherapy Is the patient on methotrexate? no  Current Complaints / other details:  breast tenderness, swelling and numbness in some areas.    Vitals:   10/01/21 0847  BP: 136/89  Pulse: (!) 102  Resp: 20  Temp: 98.5 F (36.9 C)  SpO2: 100%  Weight: 229 lb 6.4 oz (104.1 kg)  Height: 5' 4"  (1.626 m)

## 2021-09-29 NOTE — Progress Notes (Signed)
Radiation Oncology         [phone] ________________________________  Name: Diane Miranda MRN: [id number]  Date: 10/01/2021  DOB: March 29, 1976  Re-Evaluation Note  CC: Pavelock, Ralene Bathe, MD  Magrinat, Virgie Dad, MD    ICD-10-CM   1. 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 She was seen in the multidisciplinary breast clinic on 03/07/21.   Since consultation, she underwent genetic testing on 03/07/21. Results were negative, showing no clinically significant variants detected.  She has been treated with neoadjuvant chemotherapy, consisting of carboplatin, docetaxel, trastuzumab and pertuzumab every 21 days x 6, on 03/28/2021 through 07/10/2021 under Dr. Jana Hakim. Followed by trastuzumab and pertuzumab to continue for 1 year.   She opted to proceed with right breast lumpectomy with right nodal biopsies on 08/29/21 under the care of Dr. Barry Dienes. Pathology from the procedure revealed: microscopic foci of high grade DCIS, and no evidence of residual invasive ductal carcinoma; resection margins negative for DCIS, with the closest margin being 1 mm to the anterior margin; nodal status of 5/5 right axillary sentinel node excisions negative for carcinoma. Other findings included fibrocystic changes with adenosis and usual ductal hyperplasia.  Prognostic indicators significant for: ER: 40% positive, with moderate staining intensity; PR: 60% positive, with strong staining intensity; Her2 status positive; Ki67  35%.  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 Also seen was a hyperdense 4 mm lesion in the right lobe of the liver which was too small to accurately characterize.  --Whole body bone scan on 03/23/21 which demonstrated no evidence of bony metastatic disease. --MRI of the abdomen with MRCP on 04/16/21 which revealed the known cystic lesion within the head of the pancreas without suspicious features. Imaging also again demonstrated the enhancing left breast lesion measuring 2.5 cm (previously measuring 4.4 cm). No evidence of metastatic disease otherwise seen within the abdomen. --Bilateral breast MRI with and without contrast on 07/14/21 revealing marked interval improvement at the site of the patient's right breast primary malignancy and right axillary lymphadenopathy, suggestive of a good response to treatment. No residual enhancement at the site of primary malignancy was seen. The previously noted enlarged lymph nodes were seen to display a normal appearance in size and morphology. No evidence of malignancy seen in the left breast.  On review of systems, the patient reports overall feeling well.  She does report some sensitivity in the right breast but no actual pain. She denies swelling in her right arm or hand and any other symptoms.    Allergies:  is allergic to hydrochlorothiazide.  Meds: Current Outpatient Medications  Medication Sig Dispense Refill  acetaminophen (TYLENOL) 500 MG tablet Take 1 tablet (500 mg total) by mouth 3 (three) times daily with meals as needed. Take  with aleve 220 mg tablet 90 tablet 0   Fexofenadine HCl (ALLEGRA ALLERGY PO) Take 1 tablet by mouth daily.     gabapentin (NEURONTIN) 300 MG capsule Take 1 capsule (300 mg total) by mouth at bedtime. 90 capsule 4   lidocaine-prilocaine (EMLA) cream Apply 1 application topically as needed [facility]).     loratadine (CLARITIN) 10 MG tablet Take 1 tablet (10 mg total) by mouth daily. 90 tablet 4   losartan (COZAAR) 25 MG tablet Take 1 tablet (25 mg total) by mouth every morning. 90 tablet 0   metoprolol succinate (TOPROL XL) 50 MG 24 hr tablet Take 1 tablet (50 mg total) by mouth every morning. 90 tablet 0   Multiple Vitamins-Minerals (MULTIVITAMIN WITH MINERALS) tablet Take 1 tablet by mouth daily.     ondansetron (ZOFRAN) 8 MG tablet Take 8 mg by mouth every 8 (eight) hours as needed for nausea or vomiting.     pantoprazole (PROTONIX) 20 MG tablet Take 20 mg by mouth daily.     prochlorperazine (COMPAZINE) 10 MG tablet Take 10 mg by mouth every 6 (six) hours as needed for nausea or vomiting.     rosuvastatin (CRESTOR) 10 MG tablet Take 10 mg by mouth daily.     tobramycin-dexamethasone (TOBRADEX) ophthalmic solution Place 1 drop into both eyes in the morning and at bedtime. 5 mL 2   oxyCODONE (OXY IR/ROXICODONE) 5 MG immediate release tablet Take 1 tablet (5 mg total) by mouth every 6 (six) hours as needed for severe pain. 10 tablet 0   No current facility-administered medications for this encounter.    Physical Findings: The patient is in no acute distress. Patient is alert and oriented.  height is 5' 4"  (1.626 m) and weight is 229 lb 6.4 oz (104.1 kg). Her temperature is 98.5 F (36.9 C). Her blood pressure is 136/89 and her pulse is 102 (abnormal). Her respiration is 20 and oxygen saturation is 100%.   Lungs are clear to auscultation bilaterally. Heart has regular rate and rhythm. No palpable cervical, supraclavicular, or axillary adenopathy. Abdomen soft, non-tender, normal bowel sounds.  Right  breast: Well-healing scar in the upper outer quadrant.  No signs of drainage or infection.  The patient has a separate scar in the axillary region which is also healing well without signs of drainage or infection.  Lab Findings: Lab Results  Component Value Date   WBC 5.6 09/11/2021   HGB 10.9 (L) 09/11/2021   HCT 31.1 (L) 09/11/2021   MCV 99.7 09/11/2021   PLT 228 09/11/2021    Radiographic Findings: ECHOCARDIOGRAM COMPLETE  Result Date: 09/25/2021    ECHOCARDIOGRAM REPORT   Patient Name:   Diane Miranda Date of Exam: 09/25/2021 Medical Rec #:  [id number]           Height:       64.0 in Accession #:    [id number]          Weight:       225.3 lb Date of Birth:  07-Aug-1976           BSA:          2.058 m Patient Age:    68 years            BP:           142/83 mmHg Patient Gender: F  HR:           100 bpm. Exam Location:  [facility] Procedure: 2D Echo, 3D Echo, Cardiac Doppler, Color Doppler and Strain Analysis Indications:    C50.411 Breast cancer  History:        Patient has prior history of Echocardiogram examinations, most                 recent 06/27/2021. COVID-19. Breast cancer. Obesity.  Sonographer:    Basilia Jumbo BS, RDCS Referring Phys: [place]  1. Left ventricular ejection fraction, by estimation, is 60 to 65%. Left ventricular ejection fraction by 3D volume is 61 %. The left ventricle has normal function. The left ventricle has no regional wall motion abnormalities. Left ventricular diastolic  parameters were normal. The average left ventricular global longitudinal strain is -24.2 %. The global longitudinal strain is normal.  2. Right ventricular systolic function is normal. The right ventricular size is normal. Tricuspid regurgitation signal is inadequate for assessing PA pressure.  3. The mitral valve is grossly normal. No evidence of mitral valve regurgitation. No evidence of mitral stenosis.  4. The aortic valve is tricuspid. Aortic  valve regurgitation is not visualized. No aortic stenosis is present.  5. The inferior vena cava is normal in size with greater than 50% respiratory variability, suggesting right atrial pressure of 3 mmHg. Comparison(s): No significant change from prior study. 06/27/21 EF 65-70%. GLS -24.6%. FINDINGS  Left Ventricle: Left ventricular ejection fraction, by estimation, is 60 to 65%. Left ventricular ejection fraction by 3D volume is 61 %. The left ventricle has normal function. The left ventricle has no regional wall motion abnormalities. The average left ventricular global longitudinal strain is -24.2 %. The global longitudinal strain is normal. The left ventricular internal cavity size was normal in size. There is no left ventricular hypertrophy. Left ventricular diastolic parameters were normal. Right Ventricle: The right ventricular size is normal. No increase in right ventricular wall thickness. Right ventricular systolic function is normal. Tricuspid regurgitation signal is inadequate for assessing PA pressure. Left Atrium: Left atrial size was normal in size. Right Atrium: Right atrial size was normal in size. Pericardium: There is no evidence of pericardial effusion. Mitral Valve: The mitral valve is grossly normal. No evidence of mitral valve regurgitation. No evidence of mitral valve stenosis. Tricuspid Valve: The tricuspid valve is grossly normal. Tricuspid valve regurgitation is trivial. No evidence of tricuspid stenosis. Aortic Valve: The aortic valve is tricuspid. Aortic valve regurgitation is not visualized. No aortic stenosis is present. Pulmonic Valve: The pulmonic valve was grossly normal. Pulmonic valve regurgitation is not visualized. No evidence of pulmonic stenosis. Aorta: The aortic root and ascending aorta are structurally normal, with no evidence of dilitation. Venous: The right lower pulmonary vein is normal. The inferior vena cava is normal in size with greater than 50% respiratory  variability, suggesting right atrial pressure of 3 mmHg. IAS/Shunts: The atrial septum is grossly normal.  LEFT VENTRICLE PLAX 2D LVIDd:         4.20 cm         Diastology LVIDs:         2.80 cm         LV e' medial:    11.60 cm/s LV PW:         0.70 cm         LV E/e' medial:  9.2 LV IVS:        0.90 cm  LV e' lateral:   14.90 cm/s LVOT diam:     2.00 cm         LV E/e' lateral: 7.2 LV SV:         77 LV SV Index:   38              2D LVOT Area:     3.14 cm        Longitudinal                                Strain                                2D Strain GLS  -23.8 %                                (A2C):                                2D Strain GLS  -23.8 %                                (A3C):                                2D Strain GLS  -25.0 %                                (A4C):                                2D Strain GLS  -24.2 %                                Avg:                                 3D Volume EF                                LV 3D EF:    Left                                             ventricul                                             ar                                             ejection  fraction                                             by 3D                                             volume is                                             61 %.                                 3D Volume EF:                                3D EF:        61 %                                LV EDV:       94 ml                                LV ESV:       37 ml                                LV SV:        57 ml RIGHT VENTRICLE             IVC RV Basal diam:  3.10 cm     IVC diam: 1.90 cm RV S prime:     14.40 cm/s TAPSE (M-mode): 2.5 cm LEFT ATRIUM             Index        RIGHT ATRIUM           Index LA diam:        3.70 cm 1.80 cm/m   RA Pressure: 3.00 mmHg LA Vol (A2C):   50.9 ml 24.73 ml/m  RA Area:     9.31 cm LA Vol (A4C):   50.9 ml 24.73 ml/m   RA Volume:   16.40 ml  7.97 ml/m LA Biplane Vol: 52.1 ml 25.32 ml/m  AORTIC VALVE LVOT Vmax:   147.00 cm/s LVOT Vmean:  96.600 cm/s LVOT VTI:    0.246 m  AORTA Ao Root diam: 3.40 cm Ao Asc diam:  3.20 cm MITRAL VALVE                TRICUSPID VALVE                             Estimated RAP:  3.00 mmHg MV Decel Time: 180 msec MV E velocity: 107.00 cm/s  SHUNTS MV A velocity: 96.40 cm/s   Systemic VTI:  0.25 m MV E/A ratio:  1.11  Systemic Diam: 2.00 cm Eleonore Chiquito MD Electronically signed by Eleonore Chiquito MD Signature Date/Time: 09/25/2021/7:40:15 PM    Final     Impression:  Status post right lumpectomy: Stage 0, Right Breast UOQ, microscopic foci of high grade DCIS, and no evidence of residual invasive ductal carcinoma, ER+ / PR+ / Her2+  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  Patient was found to have metastatic spread in the axillary region prior to neoadjuvant treatment.  Given these findings would also recommend coverage of the axillary region at the same time as her breast radiation treatment.  I discussed the general course of radiation therapy anticipated side effects and potential toxicities of radiation therapy with patient.  She appears to understand and wishes to proceed with planned course of treatment.  Plan:  Patient is scheduled for CT simulation later today.  Anticipate 6 and half weeks of radiation therapy.  -----------------------------------  Blair Promise, PhD, MD  This document serves as a record of services personally performed by Gery Pray, MD. It was created on his behalf by Roney Mans, a trained medical scribe. The creation of this record is based on the scribe's personal observations and the provider's statements to them. This document has been checked and approved by the attending provider.

## 2021-10-01 ENCOUNTER — Other Ambulatory Visit: Payer: Self-pay

## 2021-10-01 ENCOUNTER — Ambulatory Visit
Admission: RE | Admit: 2021-10-01 | Discharge: 2021-10-01 | Disposition: A | Discharge status: Home / Self Care | Payer: No Typology Code available for payment source | Source: Ambulatory Visit | Attending: Radiation Oncology | Admitting: Radiation Oncology

## 2021-10-01 ENCOUNTER — Encounter: Payer: Self-pay | Admitting: Radiation Oncology

## 2021-10-01 ENCOUNTER — Encounter: Payer: Self-pay | Admitting: *Deleted

## 2021-10-01 VITALS — BP 136/89 | HR 102 | Temp 98.5°F | Resp 20 | Ht 64.0 in | Wt 229.4 lb

## 2021-10-01 DIAGNOSIS — Z170 Estrogen receptor positive status [ER+]: Secondary | ICD-10-CM

## 2021-10-01 DIAGNOSIS — C50411 Malignant neoplasm of upper-outer quadrant of right female breast: Secondary | ICD-10-CM | POA: Insufficient documentation

## 2021-10-01 DIAGNOSIS — Z9221 Personal history of antineoplastic chemotherapy: Secondary | ICD-10-CM | POA: Diagnosis not present

## 2021-10-01 DIAGNOSIS — Z79899 Other long term (current) drug therapy: Secondary | ICD-10-CM | POA: Insufficient documentation

## 2021-10-01 DIAGNOSIS — Z510 Encounter for antineoplastic radiation therapy: Secondary | ICD-10-CM | POA: Insufficient documentation

## 2021-10-01 DIAGNOSIS — K8689 Other specified diseases of pancreas: Secondary | ICD-10-CM | POA: Insufficient documentation

## 2021-10-01 NOTE — Progress Notes (Signed)
[place] Psychosocial Distress Screening Clinical Social Work  Clinical Social Work was referred by distress screening protocol.  The patient scored a 5 on the Psychosocial Distress Thermometer which indicates moderate distress. Clinical Social Worker contacted patient by phone to assess for distress and other psychosocial needs.  Patient stated she was doing "ok" and taking things one step at a time.  Patient has worked with [facility] team in the past and will be starting radiation.  CSW reviewed support team and services at [facility].  Patient stated she has completed her portion of the [employer] application.  Patient plans to bring paperwork to her appointment tomorrow and turn into CSW.     ONCBCN DISTRESS SCREENING 10/01/2021  Screening Type Initial Screening  Distress experienced in past week (1-10) 5  Emotional problem type Depression;Adjusting to illness;Adjusting to appearance changes  Information Concerns Type   Physical Problem type Sleep/insomnia;Skin dry/itchy;Sexual problems  Referral to clinical social work Yes  Referral to support programs    Johnnye Lana, MSW, [place], [facility] Clinical Social Worker [place] [phone]

## 2021-10-01 NOTE — Progress Notes (Signed)
See MD note for nursing evaluation. °

## 2021-10-02 ENCOUNTER — Inpatient Hospital Stay: Payer: No Typology Code available for payment source

## 2021-10-02 ENCOUNTER — Encounter: Payer: Self-pay | Admitting: *Deleted

## 2021-10-02 ENCOUNTER — Inpatient Hospital Stay: Payer: No Typology Code available for payment source | Attending: Oncology

## 2021-10-02 VITALS — BP 133/81 | HR 100 | Temp 98.6°F | Resp 18 | Wt 226.0 lb

## 2021-10-02 DIAGNOSIS — Z95828 Presence of other vascular implants and grafts: Secondary | ICD-10-CM

## 2021-10-02 DIAGNOSIS — Z170 Estrogen receptor positive status [ER+]: Secondary | ICD-10-CM

## 2021-10-02 DIAGNOSIS — C773 Secondary and unspecified malignant neoplasm of axilla and upper limb lymph nodes: Secondary | ICD-10-CM | POA: Diagnosis not present

## 2021-10-02 DIAGNOSIS — C50411 Malignant neoplasm of upper-outer quadrant of right female breast: Secondary | ICD-10-CM | POA: Diagnosis present

## 2021-10-02 DIAGNOSIS — Z5111 Encounter for antineoplastic chemotherapy: Secondary | ICD-10-CM | POA: Diagnosis not present

## 2021-10-02 DIAGNOSIS — Z79899 Other long term (current) drug therapy: Secondary | ICD-10-CM | POA: Insufficient documentation

## 2021-10-02 LAB — CBC WITH DIFFERENTIAL/PLATELET
Abs Immature Granulocytes: 0.01 10*3/uL (ref 0.00–0.07)
Basophils Absolute: 0 10*3/uL (ref 0.0–0.1)
Basophils Relative: 0 %
Eosinophils Absolute: 0.2 10*3/uL (ref 0.0–0.5)
Eosinophils Relative: 3 %
HCT: 34.9 % — ABNORMAL LOW (ref 36.0–46.0)
Hemoglobin: 11.7 g/dL — ABNORMAL LOW (ref 12.0–15.0)
Immature Granulocytes: 0 %
Lymphocytes Relative: 29 %
Lymphs Abs: 1.8 10*3/uL (ref 0.7–4.0)
MCH: 33 pg (ref 26.0–34.0)
MCHC: 33.5 g/dL (ref 30.0–36.0)
MCV: 98.3 fL (ref 80.0–100.0)
Monocytes Absolute: 0.4 10*3/uL (ref 0.1–1.0)
Monocytes Relative: 6 %
Neutro Abs: 3.9 10*3/uL (ref 1.7–7.7)
Neutrophils Relative %: 62 %
Platelets: 235 10*3/uL (ref 150–400)
RBC: 3.55 MIL/uL — ABNORMAL LOW (ref 3.87–5.11)
RDW: 12.8 % (ref 11.5–15.5)
WBC: 6.3 10*3/uL (ref 4.0–10.5)
nRBC: 0 % (ref 0.0–0.2)

## 2021-10-02 LAB — COMPREHENSIVE METABOLIC PANEL
ALT: 12 U/L (ref 0–44)
AST: 11 U/L — ABNORMAL LOW (ref 15–41)
Albumin: 3.6 g/dL (ref 3.5–5.0)
Alkaline Phosphatase: 64 U/L (ref 38–126)
Anion gap: 10 (ref 5–15)
BUN: 10 mg/dL (ref 6–20)
CO2: 24 mmol/L (ref 22–32)
Calcium: 9.6 mg/dL (ref 8.9–10.3)
Chloride: 108 mmol/L (ref 98–111)
Creatinine, Ser: 0.9 mg/dL (ref 0.44–1.00)
GFR, Estimated: >60 mL/min (ref >60)
Glucose, Bld: 100 mg/dL — ABNORMAL HIGH (ref 70–99)
Potassium: 3.9 mmol/L (ref 3.5–5.1)
Sodium: 142 mmol/L (ref 135–145)
Total Bilirubin: 0.5 mg/dL (ref 0.3–1.2)
Total Protein: 6.7 g/dL (ref 6.5–8.1)

## 2021-10-02 MED ORDER — SODIUM CHLORIDE 0.9% FLUSH
10.0000 mL | INTRAVENOUS | Status: DC | PRN
Start: 2021-10-02 — End: 2021-10-02
  Administered 2021-10-02: 10 mL

## 2021-10-02 MED ORDER — SODIUM CHLORIDE 0.9% FLUSH
10.0000 mL | Freq: Once | INTRAVENOUS | Status: AC
  Administered 2021-10-02: 10 mL

## 2021-10-02 MED ORDER — DIPHENHYDRAMINE HCL 25 MG PO CAPS
25.0000 mg | ORAL_CAPSULE | Freq: Once | ORAL | Status: AC
  Administered 2021-10-02: 25 mg via ORAL
  Filled 2021-10-02: qty 1

## 2021-10-02 MED ORDER — ACETAMINOPHEN 325 MG PO TABS
650.0000 mg | ORAL_TABLET | Freq: Once | ORAL | Status: AC
Start: 2021-10-02 — End: 2021-10-02
  Administered 2021-10-02: 650 mg via ORAL
  Filled 2021-10-02: qty 2

## 2021-10-02 MED ORDER — HEPARIN SOD (PORK) LOCK FLUSH 100 UNIT/ML IV SOLN
500.0000 [IU] | Freq: Once | INTRAVENOUS | Status: AC | PRN
  Administered 2021-10-02: 500 [IU]

## 2021-10-02 MED ORDER — SODIUM CHLORIDE 0.9 % IV SOLN: Freq: Once | INTRAVENOUS | Status: AC

## 2021-10-02 MED ORDER — TRASTUZUMAB-ANNS CHEMO 150 MG IV SOLR
6.0000 mg/kg | Freq: Once | INTRAVENOUS | Status: AC
  Administered 2021-10-02: 609 mg via INTRAVENOUS
  Filled 2021-10-02: qty 29

## 2021-10-02 MED ORDER — SODIUM CHLORIDE 0.9 % IV SOLN
420.0000 mg | Freq: Once | INTRAVENOUS | Status: AC
  Administered 2021-10-02: 420 mg via INTRAVENOUS
  Filled 2021-10-02: qty 14

## 2021-10-02 NOTE — Patient Instructions (Signed)
[facility] ONCOLOGY  Discharge Instructions: Thank you for choosing [facility] to provide your oncology and hematology care.   If you have a lab appointment with the [facility], please go directly to the [facility] and check in at the registration area.   Wear comfortable clothing and clothing appropriate for easy access to any Portacath or PICC line.   We strive to give you quality time with your provider. You may need to reschedule your appointment if you arrive late (15 or more minutes).  Arriving late affects you and other patients whose appointments are after yours.  Also, if you miss three or more appointments without notifying the office, you may be dismissed from the clinic at the provider's discretion.      For prescription refill requests, have your pharmacy contact our office and allow 72 hours for refills to be completed.    Today you received the following chemotherapy and/or immunotherapy agents Herceptin and perjeta      To help prevent nausea and vomiting after your treatment, we encourage you to take your nausea medication as directed.  BELOW ARE SYMPTOMS THAT SHOULD BE REPORTED IMMEDIATELY: *FEVER GREATER THAN 100.4 F (38 C) OR HIGHER *CHILLS OR SWEATING *NAUSEA AND VOMITING THAT IS NOT CONTROLLED WITH YOUR NAUSEA MEDICATION *UNUSUAL SHORTNESS OF BREATH *UNUSUAL BRUISING OR BLEEDING *URINARY PROBLEMS (pain or burning when urinating, or frequent urination) *BOWEL PROBLEMS (unusual diarrhea, constipation, pain near the anus) TENDERNESS IN MOUTH AND THROAT WITH OR WITHOUT PRESENCE OF ULCERS (sore throat, sores in mouth, or a toothache) UNUSUAL RASH, SWELLING OR PAIN  UNUSUAL VAGINAL DISCHARGE OR ITCHING   Items with * indicate a potential emergency and should be followed up as soon as possible or go to the Emergency Department if any problems should occur.  Please show the CHEMOTHERAPY ALERT CARD or IMMUNOTHERAPY ALERT CARD at  check-in to the Emergency Department and triage nurse.  Should you have questions after your visit or need to cancel or reschedule your appointment, please contact [facility]  Dept: [phone]  and follow the prompts.  Office hours are 8:00 a.m. to 4:30 p.m. Monday - Friday. Please note that voicemails left after 4:00 p.m. may not be returned until the following business day.  We are closed weekends and major holidays. You have access to a nurse at all times for urgent questions. Please call the main number to the clinic Dept: [phone] and follow the prompts.   For any non-urgent questions, you may also contact your provider using MyChart. We now offer e-Visits for anyone 60 and older to request care online for non-urgent symptoms. For details visit [email or web address].   Also download the MyChart app! Go to the app store, search "MyChart", open the app, select [facility], and log in with your MyChart username and password.  Due to Covid, a mask is required upon entering the hospital/clinic. If you do not have a mask, one will be given to you upon arrival. For doctor visits, patients may have 1 support person aged 44 or older with them. For treatment visits, patients cannot have anyone with them due to current Covid guidelines and our immunocompromised population.

## 2021-10-02 NOTE — Progress Notes (Signed)
[facility] CSW Progress Notes  Intern attempted to see patient in infusion at 11:45am but patient had just left. Intern called and left a vm for patient at 2:45pm to see if she was able to drop off her completed [employer] application today, and encouraged her to call back with any questions.  Rosary Lively, BSW Intern Supervised by Gwinda Maine, LCSW

## 2021-10-08 ENCOUNTER — Encounter: Payer: Self-pay | Admitting: *Deleted

## 2021-10-08 DIAGNOSIS — Z170 Estrogen receptor positive status [ER+]: Secondary | ICD-10-CM | POA: Diagnosis not present

## 2021-10-08 DIAGNOSIS — C50411 Malignant neoplasm of upper-outer quadrant of right female breast: Secondary | ICD-10-CM | POA: Diagnosis present

## 2021-10-08 DIAGNOSIS — Z510 Encounter for antineoplastic radiation therapy: Secondary | ICD-10-CM | POA: Diagnosis not present

## 2021-10-09 ENCOUNTER — Ambulatory Visit
Admission: RE | Admit: 2021-10-09 | Discharge: 2021-10-09 | Disposition: A | Discharge status: Home / Self Care | Payer: No Typology Code available for payment source | Source: Ambulatory Visit | Attending: Radiation Oncology | Admitting: Radiation Oncology

## 2021-10-09 ENCOUNTER — Other Ambulatory Visit: Payer: Self-pay

## 2021-10-09 DIAGNOSIS — C50411 Malignant neoplasm of upper-outer quadrant of right female breast: Secondary | ICD-10-CM

## 2021-10-09 DIAGNOSIS — Z510 Encounter for antineoplastic radiation therapy: Secondary | ICD-10-CM | POA: Diagnosis not present

## 2021-10-09 MED ORDER — RADIAPLEXRX EX GEL: Freq: Once | CUTANEOUS | Status: AC

## 2021-10-09 MED ORDER — ALRA NON-METALLIC DEODORANT (RAD-ONC)
1.0000 "application " | Freq: Once | TOPICAL | Status: AC
  Administered 2021-10-09: 1 via TOPICAL

## 2021-10-09 NOTE — Progress Notes (Signed)
Pt here for patient teaching.    Pt given Radiation and You booklet, skin care instructions, Alra deodorant, and Radiaplex gel.    Reviewed areas of pertinence such as fatigue, hair loss, skin changes, breast tenderness, breast swelling, and taste changes .   Pt able to give teach back of to pat skin and use unscented/gentle soap,apply Radiaplex bid, avoid applying anything to skin within 4 hours of treatment, avoid wearing an under wire bra, and to use an electric razor if they must shave.   Pt verbalizes understanding of information given and will contact nursing with any questions or concerns.    [email or web address]

## 2021-10-10 ENCOUNTER — Encounter: Payer: Self-pay | Admitting: General Practice

## 2021-10-10 ENCOUNTER — Other Ambulatory Visit: Payer: Self-pay

## 2021-10-10 ENCOUNTER — Ambulatory Visit
Admission: RE | Admit: 2021-10-10 | Discharge: 2021-10-10 | Disposition: A | Discharge status: Home / Self Care | Payer: No Typology Code available for payment source | Source: Ambulatory Visit | Attending: Radiation Oncology | Admitting: Radiation Oncology

## 2021-10-10 DIAGNOSIS — Z510 Encounter for antineoplastic radiation therapy: Secondary | ICD-10-CM | POA: Diagnosis not present

## 2021-10-10 NOTE — Progress Notes (Signed)
[facility] CSW Progress Notes  Patient handed in application for [employer] ([email or web address]).  Required letter documenting her diagnosis and treatment plan generated. Per patient request, application was securely emailed to [email or web address].  Patient advised she can pick up application at her convenience in [place].  Also advised patient that she can reach out to [facility] for additional assistance if desired.    Edwyna Shell, LCSW Clinical Social Worker Phone:  [phone]

## 2021-10-11 ENCOUNTER — Ambulatory Visit
Admission: RE | Admit: 2021-10-11 | Discharge: 2021-10-11 | Disposition: A | Discharge status: Home / Self Care | Payer: No Typology Code available for payment source | Source: Ambulatory Visit | Attending: Radiation Oncology | Admitting: Radiation Oncology

## 2021-10-11 DIAGNOSIS — Z510 Encounter for antineoplastic radiation therapy: Secondary | ICD-10-CM | POA: Diagnosis not present

## 2021-10-12 ENCOUNTER — Other Ambulatory Visit: Payer: Self-pay

## 2021-10-12 ENCOUNTER — Ambulatory Visit
Admission: RE | Admit: 2021-10-12 | Discharge: 2021-10-12 | Disposition: A | Discharge status: Home / Self Care | Payer: No Typology Code available for payment source | Source: Ambulatory Visit | Attending: Radiation Oncology | Admitting: Radiation Oncology

## 2021-10-12 DIAGNOSIS — Z510 Encounter for antineoplastic radiation therapy: Secondary | ICD-10-CM | POA: Diagnosis not present

## 2021-10-15 ENCOUNTER — Other Ambulatory Visit: Payer: Self-pay

## 2021-10-15 ENCOUNTER — Ambulatory Visit
Admission: RE | Admit: 2021-10-15 | Discharge: 2021-10-15 | Disposition: A | Discharge status: Home / Self Care | Payer: No Typology Code available for payment source | Source: Ambulatory Visit | Attending: Radiation Oncology | Admitting: Radiation Oncology

## 2021-10-15 DIAGNOSIS — Z510 Encounter for antineoplastic radiation therapy: Secondary | ICD-10-CM | POA: Diagnosis not present

## 2021-10-16 ENCOUNTER — Ambulatory Visit
Admission: RE | Admit: 2021-10-16 | Discharge: 2021-10-16 | Disposition: A | Discharge status: Home / Self Care | Payer: No Typology Code available for payment source | Source: Ambulatory Visit | Attending: Radiation Oncology | Admitting: Radiation Oncology

## 2021-10-16 DIAGNOSIS — Z170 Estrogen receptor positive status [ER+]: Secondary | ICD-10-CM | POA: Diagnosis present

## 2021-10-16 DIAGNOSIS — Z510 Encounter for antineoplastic radiation therapy: Secondary | ICD-10-CM | POA: Insufficient documentation

## 2021-10-16 DIAGNOSIS — M6281 Muscle weakness (generalized): Secondary | ICD-10-CM | POA: Diagnosis present

## 2021-10-16 DIAGNOSIS — C50411 Malignant neoplasm of upper-outer quadrant of right female breast: Secondary | ICD-10-CM | POA: Insufficient documentation

## 2021-10-16 DIAGNOSIS — R279 Unspecified lack of coordination: Secondary | ICD-10-CM | POA: Diagnosis present

## 2021-10-17 ENCOUNTER — Other Ambulatory Visit: Payer: Self-pay

## 2021-10-17 ENCOUNTER — Ambulatory Visit
Admission: RE | Admit: 2021-10-17 | Discharge: 2021-10-17 | Disposition: A | Discharge status: Home / Self Care | Payer: No Typology Code available for payment source | Source: Ambulatory Visit | Attending: Radiation Oncology | Admitting: Radiation Oncology

## 2021-10-17 DIAGNOSIS — R279 Unspecified lack of coordination: Secondary | ICD-10-CM | POA: Diagnosis not present

## 2021-10-18 ENCOUNTER — Ambulatory Visit
Admission: RE | Admit: 2021-10-18 | Discharge: 2021-10-18 | Disposition: A | Discharge status: Home / Self Care | Payer: No Typology Code available for payment source | Source: Ambulatory Visit | Attending: Radiation Oncology | Admitting: Radiation Oncology

## 2021-10-18 DIAGNOSIS — R279 Unspecified lack of coordination: Secondary | ICD-10-CM | POA: Diagnosis not present

## 2021-10-19 ENCOUNTER — Other Ambulatory Visit: Payer: Self-pay

## 2021-10-19 ENCOUNTER — Ambulatory Visit
Admission: RE | Admit: 2021-10-19 | Discharge: 2021-10-19 | Disposition: A | Discharge status: Home / Self Care | Payer: No Typology Code available for payment source | Source: Ambulatory Visit | Attending: Radiation Oncology | Admitting: Radiation Oncology

## 2021-10-19 DIAGNOSIS — R279 Unspecified lack of coordination: Secondary | ICD-10-CM | POA: Diagnosis not present

## 2021-10-22 ENCOUNTER — Other Ambulatory Visit: Payer: Self-pay

## 2021-10-22 ENCOUNTER — Ambulatory Visit
Admission: RE | Admit: 2021-10-22 | Discharge: 2021-10-22 | Disposition: A | Discharge status: Home / Self Care | Payer: No Typology Code available for payment source | Source: Ambulatory Visit | Attending: Radiation Oncology | Admitting: Radiation Oncology

## 2021-10-22 DIAGNOSIS — R279 Unspecified lack of coordination: Secondary | ICD-10-CM | POA: Diagnosis not present

## 2021-10-22 NOTE — Progress Notes (Signed)
[name]  [phone] [phone]    ID: [name] UCCI DOB: 03/09/1976  MR#: [id number]  EXB#:[id number]  Patient Care Team: [name], MD as PCP - General (Internal Medicine) Mauro Kaufmann, RN as Oncology Nurse Navigator Rockwell Germany, RN as Oncology Nurse Navigator Stark Klein, MD as Consulting Physician (General Surgery) [name], Virgie Dad, MD as Consulting Physician (Oncology) Gery Pray, MD as Consulting Physician (Radiation Oncology) Wylene Simmer, MD as Consulting Physician (Orthopedic Surgery) Chauncey Cruel, MD OTHER MD:  CHIEF COMPLAINT: triple positive breast cancer  CURRENT TREATMENT: anti-HER2 treatment; adjuvant radiation   INTERVAL HISTORY: Diane Miranda returns today for follow up and treatment of her triple positive breast cancer.  She continues on anti-HER2 treatment every 21 days .  She is tolerating this well, with no side effects that she is aware of.  Her most recent echo, in October, showed a well-preserved ejection fraction.  She was referred back to Dr. Sondra Come on 10/01/2021 to review radiation therapy. She subsequently began treatment on 10/09/2021 and is scheduled to finish on 11/27/2021.   REVIEW OF SYSTEMS: Diane Miranda is tolerating radiation generally well.  She does feel fatigued but she is pushing herself to exercise and she is doing not only her usual activities but also walking 3 or 4 days a week.  She does gave her 84 year old daughter a week long birthday she says culminating with that meal at [employer].  A detailed review of systems today was otherwise stable.   COVID 19 VACCINATION STATUS: refuses vaccination; infection 10/2019   HISTORY OF CURRENT ILLNESS: From the original intake note:  Diane Miranda herself palpated a right breast mass and noted associated pain. She underwent bilateral diagnostic mammography with tomography and right breast ultrasonography at [facility] on 02/15/2021 showing: breast density category B; palpable 5.4 cm mass within outer right breast; two abnormal right axillary lymph nodes; 1.8 cm indeterminate hypoechoic area within upper-outer right breast; no evidence of left breast malignancy.  Accordingly on 02/23/2021 she proceeded to biopsy of the right breast areas in question. The pathology from this procedure ([id number]) showed:  1. Right Breast, 11 o'clock  - adenosis and columnar cell hyperplasia; this was concordant 2. Right Breast, 9 o'clock  - invasive ductal carcinoma, grade 3  - Prognostic indicators significant for: estrogen receptor, 40% positive with moderate staining intensity and progesterone receptor, 60% positive with strong staining intensity. Proliferation marker Ki67 at 35%. HER2 positive by immunohistochemistry (3+). 3. Right Axilla, lymph node  - invasive ductal carcinoma involving nodal tissue  She underwent breast MRI on 03/04/2021 showing: breast composition B; 4.7 cm mass in 8-9 o'clock position of posterior right breast represents recently diagnosed malignancy; two adjacent abnormal right axillary lymph nodes, one of which represents the biopsy-proven metastatic node; significant skin thickening in periareolar right breast, with significant increased T2 signal consistent with edema; mild abnormal enhancement within thickened skin, nonspecific.  There was also a 1.1 cm nonspecific enhancing mass in left breast at 2-3 o'clock.  Cancer Staging Malignant neoplasm of upper-outer quadrant of right breast in female, estrogen receptor positive ([place]) Staging form: Breast, AJCC 8th Edition - Clinical: Stage IIB (cT3, cN1, cM0, G3, ER+, PR+, HER2+) - Signed by Chauncey Cruel, MD on 03/07/2021 Histologic grading system: 3 grade system  The patient's subsequent history is as detailed below.   PAST MEDICAL HISTORY: Past Medical History:  Diagnosis Date   Allergy 2000   Asthma    as  child   Breast cancer [facility])     Family history of lung cancer 03/08/2021   Family history of uterine cancer 03/08/2021   GERD (gastroesophageal reflux disease)    Headache    History of COVID-19    Hyperlipidemia    Hypertension    Pre-diabetes    Prediabetes    right breast ca dx'd 20-Mar-2021   Trimalleolar fracture of ankle, closed, left, initial encounter     PAST SURGICAL HISTORY: Past Surgical History:  Procedure Laterality Date   BREAST BIOPSY Right 03/22/2021   Procedure: PUNCH BIOPSY OF RIGHT AREOLA;  Surgeon: Stark Klein, MD;  Location: [place];  Service: General;  Laterality: Right;   BREAST LUMPECTOMY WITH RADIOACTIVE SEED AND SENTINEL LYMPH NODE BIOPSY Right 08/29/2021   Procedure: RIGHT BREAST LUMPECTOMY WITH RADIOACTIVE SEED AND RIGHT AXILLARY SENTINEL LYMPH NODE BIOPSY;  Surgeon: Stark Klein, MD;  Location: [place];  Service: General;  Laterality: Right;   FRACTURE SURGERY  2018   NO PAST SURGERIES     ORIF ANKLE FRACTURE Left 07/10/2017   Procedure: OPEN REDUCTION INTERNAL FIXATION (ORIF) trimallolar ANKLE FRACTURE;  Surgeon: Wylene Simmer, MD;  Location: [place];  Service: Orthopedics;  Laterality: Left;   PORTACATH PLACEMENT Left 03/22/2021   Procedure: INSERTION PORT-A-CATH;  Surgeon: Stark Klein, MD;  Location: [place];  Service: General;  Laterality: Left;   RADIOACTIVE SEED GUIDED AXILLARY SENTINEL LYMPH NODE Right 08/29/2021   Procedure: RADIOACTIVE SEED GUIDED AXILLARY SENTINEL LYMPH NODE EXCISION;  Surgeon: Stark Klein, MD;  Location: [place];  Service: General;  Laterality: Right;    FAMILY HISTORY: Family History  Problem Relation Age of Onset   Diabetes Father    Hypertension Father    Early death Mother    Heart disease Mother    Hypertension Mother    Hearing loss Maternal Grandmother    Hypertension Maternal Grandmother    Uterine cancer Other 49       MGM's sister   Throat cancer Other        MGM's brother; dx after 16    Lung cancer Other        PGM's sister; dx 67s   Diabetes Brother    Her father is living at age 79 as of 2021/03/20. Her mother died at age 33 from [place]. Diane Miranda has two brothers (and no sisters). She reports endometrial cancer in a maternal great aunt and lung cancer in a paternal aunt. There is no other family history of cancer to her knowledge.   GYNECOLOGIC HISTORY:  No LMP recorded. Menarche: 45 years old Age at first live birth: 45 years old [place] P 2 LMP early Mar 20, 2021 (has since had IUD placed) Contraceptive: IUD in place [place]), previously used pills from 1996-2006 HRT n/a  Hysterectomy? no BSO? 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 She is single. She lives at home with daughter Joylene Draft (age 44), adopted daughter Hetty Blend (age 4), and the patient's maternal grandmother, who has significant dementia issues. Daughter Levy Pupa, age 17, works in Press photographer in [place]. Kajol is a Restaurant manager, fast food.    ADVANCED DIRECTIVES: not in place; however the patient made it clear during her 03/07/2021 visit that she would not accept blood product transfusions for any reason including immediately life saving reasons   HEALTH MAINTENANCE: Social History   Tobacco Use   Smoking status: Never   Smokeless tobacco:  Never  Vaping Use   Vaping Use: Never used  Substance Use Topics   Alcohol use: Yes    Comment: social   Drug use: No     Colonoscopy: n/a (age)  PAP: 2020  Bone density: n/a (age)   Allergies  Allergen Reactions   Hydrochlorothiazide Anaphylaxis and Swelling    Current Outpatient Medications  Medication Sig Dispense Refill   acetaminophen (TYLENOL) 500 MG tablet Take 1 tablet (500 mg total) by mouth 3 (three) times daily with meals as needed. Take with aleve 220 mg tablet 90 tablet 0   Fexofenadine HCl (ALLEGRA ALLERGY PO) Take 1 tablet by mouth daily.     gabapentin (NEURONTIN) 300 MG  capsule Take 1 capsule (300 mg total) by mouth at bedtime. 90 capsule 4   lidocaine-prilocaine (EMLA) cream Apply 1 application topically as needed [facility]).     loratadine (CLARITIN) 10 MG tablet Take 1 tablet (10 mg total) by mouth daily. 90 tablet 4   losartan (COZAAR) 25 MG tablet Take 1 tablet (25 mg total) by mouth every morning. 90 tablet 0   metoprolol succinate (TOPROL XL) 50 MG 24 hr tablet Take 1 tablet (50 mg total) by mouth every morning. 90 tablet 0   Multiple Vitamins-Minerals (MULTIVITAMIN WITH MINERALS) tablet Take 1 tablet by mouth daily.     ondansetron (ZOFRAN) 8 MG tablet Take 8 mg by mouth every 8 (eight) hours as needed for nausea or vomiting.     oxyCODONE (OXY IR/ROXICODONE) 5 MG immediate release tablet Take 1 tablet (5 mg total) by mouth every 6 (six) hours as needed for severe pain. 10 tablet 0   pantoprazole (PROTONIX) 20 MG tablet Take 20 mg by mouth daily.     prochlorperazine (COMPAZINE) 10 MG tablet Take 10 mg by mouth every 6 (six) hours as needed for nausea or vomiting.     rosuvastatin (CRESTOR) 10 MG tablet Take 10 mg by mouth daily.     tobramycin-dexamethasone (TOBRADEX) ophthalmic solution Place 1 drop into both eyes in the morning and at bedtime. 5 mL 2   No current facility-administered medications for this visit.    OBJECTIVE: African-American woman who appears stated age  47:   10/23/21 0947  BP: 140/85  Pulse: 85  Resp: 18  Temp: (!) 97.5 F (36.4 C)  SpO2: 100%       Body mass index is 39.39 kg/m.   Wt Readings from Last 3 Encounters:  10/23/21 229 lb 8 oz (104.1 kg)  10/02/21 226 lb (102.5 kg)  10/01/21 229 lb 6.4 oz (104.1 kg)     ECOG FS:1 - Symptomatic but completely ambulatory  Sclerae unicteric, EOMs intact Wearing a mask No cervical or supraclavicular adenopathy Lungs no rales or rhonchi Heart regular rate and rhythm Abd soft, nontender, positive bowel sounds MSK no focal spinal tenderness, no upper extremity  lymphedema Neuro: nonfocal, well oriented, appropriate affect Breasts: The right breast is status postlumpectomy and is currently receiving radiation.  The cosmetic result is excellent.  There is no significant hyperpigmentation and certainly no desquamation to date.  Left breast and both axillae are benign   LAB RESULTS:  CMP     Component Value Date/Time   NA 142 10/02/2021 0834   K 3.9 10/02/2021 0834   CL 108 10/02/2021 0834   CO2 24 10/02/2021 0834   GLUCOSE 100 (H) 10/02/2021 0834   GLUCOSE 105 (H) 11/12/2006 1022   BUN 10 10/02/2021 0834   CREATININE 0.90 10/02/2021 0834  CREATININE 0.92 03/07/2021 1219   CALCIUM 9.6 10/02/2021 0834   PROT 6.7 10/02/2021 0834   ALBUMIN 3.6 10/02/2021 0834   AST 11 (L) 10/02/2021 0834   AST 13 (L) 03/07/2021 1219   ALT 12 10/02/2021 0834   ALT 16 03/07/2021 1219   ALKPHOS 64 10/02/2021 0834   BILITOT 0.5 10/02/2021 0834   BILITOT 0.6 03/07/2021 1219   GFRNONAA >60 10/02/2021 0834   GFRNONAA >60 03/07/2021 1219   GFRAA >90 01/30/2015 1319    No results found for: TOTALPROTELP, ALBUMINELP, A1GS, A2GS, BETS, BETA2SER, GAMS, MSPIKE, SPEI  Lab Results  Component Value Date   WBC 5.8 10/23/2021   NEUTROABS 4.1 10/23/2021   HGB 11.8 (L) 10/23/2021   HCT 33.9 (L) 10/23/2021   MCV 94.4 10/23/2021   PLT 205 10/23/2021    No results found for: LABCA2  No components found for: JGGEZM629  No results for input(s): INR in the last 168 hours.  No results found for: LABCA2  No results found for: UTM546  No results found for: TKP546  No results found for: FKC127  No results found for: CA2729  No components found for: HGQUANT  No results found for: CEA1 / No results found for: CEA1   No results found for: AFPTUMOR  No results found for: CHROMOGRNA  No results found for: KPAFRELGTCHN, LAMBDASER, KAPLAMBRATIO (kappa/lambda light chains)  No results found for: HGBA, HGBA2QUANT, HGBFQUANT, HGBSQUAN (Hemoglobinopathy  evaluation)   No results found for: LDH  No results found for: IRON, TIBC, IRONPCTSAT (Iron and TIBC)  No results found for: FERRITIN  Urinalysis    Component Value Date/Time   COLORURINE YELLOW 01/30/2015 1343   APPEARANCEUR CLEAR 01/30/2015 1343   LABSPEC 1.011 01/30/2015 1343   PHURINE 7.5 01/30/2015 1343   GLUCOSEU NEGATIVE 01/30/2015 1343   HGBUR NEGATIVE 01/30/2015 1343   BILIRUBINUR NEGATIVE 01/30/2015 1343   KETONESUR NEGATIVE 01/30/2015 1343   PROTEINUR NEGATIVE 01/30/2015 1343   UROBILINOGEN 0.2 01/30/2015 1343   NITRITE NEGATIVE 01/30/2015 1343   LEUKOCYTESUR SMALL (A) 01/30/2015 1343    STUDIES: ECHOCARDIOGRAM COMPLETE  Result Date: 09/25/2021    ECHOCARDIOGRAM REPORT   Patient Name:   IVANNA KOCAK [place] Date of Exam: 09/25/2021 Medical Rec #:  [id number]           Height:       64.0 in Accession #:    [id number]          Weight:       225.3 lb Date of Birth:  1976-09-22           BSA:          2.058 m Patient Age:    76 years            BP:           142/83 mmHg Patient Gender: F                   HR:           100 bpm. Exam Location:  [place] Procedure: 2D Echo, 3D Echo, Cardiac Doppler, Color Doppler and Strain Analysis Indications:    C50.411 Breast cancer  History:        Patient has prior history of Echocardiogram examinations, most                 recent 06/27/2021. COVID-19. Breast cancer. Obesity.  Sonographer:    Basilia Jumbo BS, RDCS Referring Phys: [place]  1. Left ventricular ejection fraction, by estimation, is 60 to 65%. Left ventricular ejection fraction by 3D volume is 61 %. The left ventricle has normal function. The left ventricle has no regional wall motion abnormalities. Left ventricular diastolic  parameters were normal. The average left ventricular global longitudinal strain is -24.2 %. The global longitudinal strain is normal.  2. Right ventricular systolic function is normal. The right ventricular size is normal.  Tricuspid regurgitation signal is inadequate for assessing PA pressure.  3. The mitral valve is grossly normal. No evidence of mitral valve regurgitation. No evidence of mitral stenosis.  4. The aortic valve is tricuspid. Aortic valve regurgitation is not visualized. No aortic stenosis is present.  5. The inferior vena cava is normal in size with greater than 50% respiratory variability, suggesting right atrial pressure of 3 mmHg. Comparison(s): No significant change from prior study. 06/27/21 EF 65-70%. GLS -24.6%. FINDINGS  Left Ventricle: Left ventricular ejection fraction, by estimation, is 60 to 65%. Left ventricular ejection fraction by 3D volume is 61 %. The left ventricle has normal function. The left ventricle has no regional wall motion abnormalities. The average left ventricular global longitudinal strain is -24.2 %. The global longitudinal strain is normal. The left ventricular internal cavity size was normal in size. There is no left ventricular hypertrophy. Left ventricular diastolic parameters were normal. Right Ventricle: The right ventricular size is normal. No increase in right ventricular wall thickness. Right ventricular systolic function is normal. Tricuspid regurgitation signal is inadequate for assessing PA pressure. Left Atrium: Left atrial size was normal in size. Right Atrium: Right atrial size was normal in size. Pericardium: There is no evidence of pericardial effusion. Mitral Valve: The mitral valve is grossly normal. No evidence of mitral valve regurgitation. No evidence of mitral valve stenosis. Tricuspid Valve: The tricuspid valve is grossly normal. Tricuspid valve regurgitation is trivial. No evidence of tricuspid stenosis. Aortic Valve: The aortic valve is tricuspid. Aortic valve regurgitation is not visualized. No aortic stenosis is present. Pulmonic Valve: The pulmonic valve was grossly normal. Pulmonic valve regurgitation is not visualized. No evidence of pulmonic stenosis. Aorta:  The aortic root and ascending aorta are structurally normal, with no evidence of dilitation. Venous: The right lower pulmonary vein is normal. The inferior vena cava is normal in size with greater than 50% respiratory variability, suggesting right atrial pressure of 3 mmHg. IAS/Shunts: The atrial septum is grossly normal.  LEFT VENTRICLE PLAX 2D LVIDd:         4.20 cm         Diastology LVIDs:         2.80 cm         LV e' medial:    11.60 cm/s LV PW:         0.70 cm         LV E/e' medial:  9.2 LV IVS:        0.90 cm         LV e' lateral:   14.90 cm/s LVOT diam:     2.00 cm         LV E/e' lateral: 7.2 LV SV:         77 LV SV Index:   38              2D LVOT Area:     3.14 cm        Longitudinal  Strain                                2D Strain GLS  -23.8 %                                (A2C):                                2D Strain GLS  -23.8 %                                (A3C):                                2D Strain GLS  -25.0 %                                (A4C):                                2D Strain GLS  -24.2 %                                Avg:                                 3D Volume EF                                LV 3D EF:    Left                                             ventricul                                             ar                                             ejection                                             fraction                                             by 3D  volume is                                             61 %.                                 3D Volume EF:                                3D EF:        61 %                                LV EDV:       94 ml                                LV ESV:       37 ml                                LV SV:        57 ml RIGHT VENTRICLE             IVC RV Basal diam:  3.10 cm     IVC diam: 1.90 cm RV S prime:     14.40 cm/s TAPSE (M-mode): 2.5 cm LEFT  ATRIUM             Index        RIGHT ATRIUM           Index LA diam:        3.70 cm 1.80 cm/m   RA Pressure: 3.00 mmHg LA Vol (A2C):   50.9 ml 24.73 ml/m  RA Area:     9.31 cm LA Vol (A4C):   50.9 ml 24.73 ml/m  RA Volume:   16.40 ml  7.97 ml/m LA Biplane Vol: 52.1 ml 25.32 ml/m  AORTIC VALVE LVOT Vmax:   147.00 cm/s LVOT Vmean:  96.600 cm/s LVOT VTI:    0.246 m  AORTA Ao Root diam: 3.40 cm Ao Asc diam:  3.20 cm MITRAL VALVE                TRICUSPID VALVE                             Estimated RAP:  3.00 mmHg MV Decel Time: 180 msec MV E velocity: 107.00 cm/s  SHUNTS MV A velocity: 96.40 cm/s   Systemic VTI:  0.25 m MV E/A ratio:  1.11         Systemic Diam: 2.00 cm Eleonore Chiquito MD Electronically signed by Eleonore Chiquito MD Signature Date/Time: 09/25/2021/7:40:15 PM    Final      ELIGIBLE FOR AVAILABLE RESEARCH PROTOCOL:no  ASSESSMENT: 45 y.o. [place] woman status post right breast upper outer quadrant biopsy 02/23/2021 for a clinical T3 N1, stage IIB invasive ductal carcinoma, grade 3, triple positive, with an MIB-1 of 35%  (a) right periareolar biopsy 02/23/2021 was benign/concordant  (b) left breast nodule biopsy 03/14/2021 benign  (c) Punch biopsy of the  right areolar area 03/22/2021 no malignancy  (d) CT of the chest abdomen and pelvis 03/20/2021 shows no evidence of metastatic disease; there is 2.5 cm cystic pancreatic mass and a 0.4 cm right liver lobe lesion requiring further follow-up  (e) bone scan 03/25/2021 shows no bone metastases  (1) genetics testing 03/26/2021 through the [employer]-Cancer+RNAinsight panel offered by [facility] found no deleterious mutations in APC, ATM, AXIN2, BARD1, BMPR1A, BRCA1, BRCA2, BRIP1, CDH1, CDK4, CDKN2A, CHEK2, DICER1, EPCAM, GREM1, HOXB13, MEN1, MLH1, MSH2, MSH3, MSH6, MUTYH, NBN, NF1, NF2, NTHL1, PALB2, PMS2, POLD1, POLE, PTEN, RAD51C, RAD51D, RECQL, RET, SDHA, SDHAF2, SDHB, SDHC, SDHD, SMAD4, SMARCA4, STK11, TP53, TSC1, TSC2, and VHL.  RNA data  is routinely analyzed for use in variant interpretation for all genes.  (2) neoadjuvant chemotherapy consisting of carboplatin, docetaxel, trastuzumab and pertuzumab every 21 days x 6 beginning 03/28/2021, completed 07/10/2021  (3) trastuzumab and pertuzumab to continue to total 1 year  (a) echo 03/13/2021 showed an ejection fraction in the 60- 65% range  (b) echo 06/27/2021 showed an ejection fraction in the 65-70%  (c) echo 09/25/2021 shows an ejection fraction in the 60-65% range.  (4) right lumpectomy and sentinel lymph node sampling 08/29/2021 showed no residual invasive disease in the breast or lymph nodes, rare foci of residual DCIS the largest measuring 0.1 cm, with negative margins:ypT(is) ypN0  (a) 5 right  axillary lymph nodes were removed  (5) adjuvant radiation to be completed 11/27/2021 (6) antiestrogens to follow   PLAN: Darianna is now 2 months postsurgery.  She is receiving radiation.  She is tolerating treatment quite well.  She understands that the fatigue she feels there is normal and that the best way to combat it is indeed to be as active as she can manage.  It helps that her hemoglobin is approaching normalcy.  The plan is to continue trastuzumab to complete a year which will take [place] to April of next year.  In the original staging studies there was a pancreatic cyst and a very small liver lesion.  We are going to obtain a repeat MRI of the liver mid December to further evaluate those  She will see me December 04, 2021 to discuss those results and we will set her up for further follow-up.  Total encounter time 25 minutes.Sarajane Jews C. Alva Kuenzel, MD 10/23/2021 10:11 AM Medical Oncology and Hematology [facility], [place] [address] Tel. [phone]    Fax. [phone]   This document serves as a record of services personally performed by Lurline Del, MD. It was created on his behalf by Wilburn Mylar, a trained medical  scribe. The creation of this record is based on the scribe's personal observations and the provider's statements to them.   I, Lurline Del MD, have reviewed the above documentation for accuracy and completeness, and I agree with the above.   *Total Encounter Time as defined by the Centers for Medicare and Medicaid Services includes, in addition to the face-to-face time of a patient visit (documented in the note above) non-face-to-face time: obtaining and reviewing outside history, ordering and reviewing medications, tests or procedures, care coordination (communications with other health care professionals or caregivers) and documentation in the medical record.

## 2021-10-23 ENCOUNTER — Inpatient Hospital Stay: Payer: No Typology Code available for payment source

## 2021-10-23 ENCOUNTER — Encounter: Payer: Self-pay | Admitting: General Practice

## 2021-10-23 ENCOUNTER — Ambulatory Visit
Admission: RE | Admit: 2021-10-23 | Discharge: 2021-10-23 | Disposition: A | Discharge status: Home / Self Care | Payer: No Typology Code available for payment source | Source: Ambulatory Visit | Attending: Radiation Oncology | Admitting: Radiation Oncology

## 2021-10-23 ENCOUNTER — Telehealth: Payer: Self-pay

## 2021-10-23 ENCOUNTER — Inpatient Hospital Stay (HOSPITAL_BASED_OUTPATIENT_CLINIC_OR_DEPARTMENT_OTHER): Payer: No Typology Code available for payment source | Admitting: Oncology

## 2021-10-23 VITALS — BP 140/85 | HR 85 | Temp 97.5°F | Resp 18 | Ht 64.0 in | Wt 229.5 lb

## 2021-10-23 DIAGNOSIS — Z5111 Encounter for antineoplastic chemotherapy: Secondary | ICD-10-CM | POA: Insufficient documentation

## 2021-10-23 DIAGNOSIS — R279 Unspecified lack of coordination: Secondary | ICD-10-CM | POA: Diagnosis not present

## 2021-10-23 DIAGNOSIS — C50411 Malignant neoplasm of upper-outer quadrant of right female breast: Secondary | ICD-10-CM

## 2021-10-23 DIAGNOSIS — Z170 Estrogen receptor positive status [ER+]: Secondary | ICD-10-CM

## 2021-10-23 DIAGNOSIS — Z79899 Other long term (current) drug therapy: Secondary | ICD-10-CM | POA: Insufficient documentation

## 2021-10-23 DIAGNOSIS — Z95828 Presence of other vascular implants and grafts: Secondary | ICD-10-CM

## 2021-10-23 LAB — COMPREHENSIVE METABOLIC PANEL
ALT: 10 U/L (ref 0–44)
AST: 10 U/L — ABNORMAL LOW (ref 15–41)
Albumin: 3.5 g/dL (ref 3.5–5.0)
Alkaline Phosphatase: 66 U/L (ref 38–126)
Anion gap: 8 (ref 5–15)
BUN: 8 mg/dL (ref 6–20)
CO2: 26 mmol/L (ref 22–32)
Calcium: 8.7 mg/dL — ABNORMAL LOW (ref 8.9–10.3)
Chloride: 108 mmol/L (ref 98–111)
Creatinine, Ser: 0.79 mg/dL (ref 0.44–1.00)
GFR, Estimated: >60 mL/min (ref >60)
Glucose, Bld: 107 mg/dL — ABNORMAL HIGH (ref 70–99)
Potassium: 3.8 mmol/L (ref 3.5–5.1)
Sodium: 142 mmol/L (ref 135–145)
Total Bilirubin: 0.5 mg/dL (ref 0.3–1.2)
Total Protein: 6.4 g/dL — ABNORMAL LOW (ref 6.5–8.1)

## 2021-10-23 LAB — CBC WITH DIFFERENTIAL/PLATELET
Abs Immature Granulocytes: 0.01 10*3/uL (ref 0.00–0.07)
Basophils Absolute: 0 10*3/uL (ref 0.0–0.1)
Basophils Relative: 1 %
Eosinophils Absolute: 0.1 10*3/uL (ref 0.0–0.5)
Eosinophils Relative: 2 %
HCT: 33.9 % — ABNORMAL LOW (ref 36.0–46.0)
Hemoglobin: 11.8 g/dL — ABNORMAL LOW (ref 12.0–15.0)
Immature Granulocytes: 0 %
Lymphocytes Relative: 20 %
Lymphs Abs: 1.2 10*3/uL (ref 0.7–4.0)
MCH: 32.9 pg (ref 26.0–34.0)
MCHC: 34.8 g/dL (ref 30.0–36.0)
MCV: 94.4 fL (ref 80.0–100.0)
Monocytes Absolute: 0.3 10*3/uL (ref 0.1–1.0)
Monocytes Relative: 5 %
Neutro Abs: 4.1 10*3/uL (ref 1.7–7.7)
Neutrophils Relative %: 72 %
Platelets: 205 10*3/uL (ref 150–400)
RBC: 3.59 MIL/uL — ABNORMAL LOW (ref 3.87–5.11)
RDW: 13.2 % (ref 11.5–15.5)
WBC: 5.8 10*3/uL (ref 4.0–10.5)
nRBC: 0 % (ref 0.0–0.2)

## 2021-10-23 MED ORDER — ACETAMINOPHEN 325 MG PO TABS
650.0000 mg | ORAL_TABLET | Freq: Once | ORAL | Status: AC
  Administered 2021-10-23: 650 mg via ORAL
  Filled 2021-10-23: qty 2

## 2021-10-23 MED ORDER — SODIUM CHLORIDE 0.9% FLUSH
10.0000 mL | INTRAVENOUS | Status: DC | PRN
  Administered 2021-10-23: 10 mL

## 2021-10-23 MED ORDER — SODIUM CHLORIDE 0.9 % IV SOLN: Freq: Once | INTRAVENOUS | Status: AC

## 2021-10-23 MED ORDER — HEPARIN SOD (PORK) LOCK FLUSH 100 UNIT/ML IV SOLN
500.0000 [IU] | Freq: Once | INTRAVENOUS | Status: AC | PRN
  Administered 2021-10-23: 500 [IU]

## 2021-10-23 MED ORDER — SODIUM CHLORIDE 0.9 % IV SOLN
420.0000 mg | Freq: Once | INTRAVENOUS | Status: AC
  Administered 2021-10-23: 420 mg via INTRAVENOUS
  Filled 2021-10-23: qty 14

## 2021-10-23 MED ORDER — TRASTUZUMAB-ANNS CHEMO 150 MG IV SOLR
6.0000 mg/kg | Freq: Once | INTRAVENOUS | Status: AC
  Administered 2021-10-23: 609 mg via INTRAVENOUS
  Filled 2021-10-23: qty 29

## 2021-10-23 MED ORDER — DIPHENHYDRAMINE HCL 25 MG PO CAPS
25.0000 mg | ORAL_CAPSULE | Freq: Once | ORAL | Status: AC
  Administered 2021-10-23: 25 mg via ORAL
  Filled 2021-10-23: qty 1

## 2021-10-23 MED ORDER — SODIUM CHLORIDE 0.9% FLUSH
10.0000 mL | Freq: Once | INTRAVENOUS | Status: AC
  Administered 2021-10-23: 10 mL

## 2021-10-23 NOTE — Progress Notes (Signed)
[place] CSW Progress Notes  Patient received copy of [employer], agency will follow up with patient directly.  Patient also was given back copies of bills she had left w CSW - any not needed by grant will need to be dealt with by patient.  She was advised that [employer] may have funds for emergency assistance with utility bills and she can go there without a referral.  Edwyna Shell, [place] Worker Phone:  [phone]

## 2021-10-23 NOTE — Telephone Encounter (Signed)
Patient notified of completion of Attending Physician Statement for Disability Claim and completion of AIG Claim Form. Copy placed at Registration Desk for pick-up as requested.

## 2021-10-24 ENCOUNTER — Other Ambulatory Visit: Payer: Self-pay

## 2021-10-24 ENCOUNTER — Ambulatory Visit
Admission: RE | Admit: 2021-10-24 | Discharge: 2021-10-24 | Disposition: A | Discharge status: Home / Self Care | Payer: No Typology Code available for payment source | Source: Ambulatory Visit | Attending: Radiation Oncology | Admitting: Radiation Oncology

## 2021-10-24 DIAGNOSIS — R279 Unspecified lack of coordination: Secondary | ICD-10-CM | POA: Diagnosis not present

## 2021-10-25 ENCOUNTER — Ambulatory Visit
Admission: RE | Admit: 2021-10-25 | Discharge: 2021-10-25 | Disposition: A | Discharge status: Home / Self Care | Payer: No Typology Code available for payment source | Source: Ambulatory Visit | Attending: Radiation Oncology | Admitting: Radiation Oncology

## 2021-10-25 DIAGNOSIS — R279 Unspecified lack of coordination: Secondary | ICD-10-CM | POA: Diagnosis not present

## 2021-10-26 ENCOUNTER — Ambulatory Visit
Admission: RE | Admit: 2021-10-26 | Discharge: 2021-10-26 | Disposition: A | Discharge status: Home / Self Care | Payer: No Typology Code available for payment source | Source: Ambulatory Visit | Attending: Radiation Oncology | Admitting: Radiation Oncology

## 2021-10-26 ENCOUNTER — Other Ambulatory Visit: Payer: Self-pay

## 2021-10-26 DIAGNOSIS — R279 Unspecified lack of coordination: Secondary | ICD-10-CM | POA: Diagnosis not present

## 2021-10-29 ENCOUNTER — Ambulatory Visit
Admission: RE | Admit: 2021-10-29 | Discharge: 2021-10-29 | Disposition: A | Discharge status: Home / Self Care | Payer: No Typology Code available for payment source | Source: Ambulatory Visit | Attending: Radiation Oncology | Admitting: Radiation Oncology

## 2021-10-29 ENCOUNTER — Encounter: Payer: Self-pay | Admitting: Physical Therapy

## 2021-10-29 ENCOUNTER — Ambulatory Visit: Payer: No Typology Code available for payment source | Attending: General Surgery | Admitting: Physical Therapy

## 2021-10-29 ENCOUNTER — Other Ambulatory Visit: Payer: Self-pay

## 2021-10-29 DIAGNOSIS — R279 Unspecified lack of coordination: Secondary | ICD-10-CM | POA: Diagnosis not present

## 2021-10-29 DIAGNOSIS — M6281 Muscle weakness (generalized): Secondary | ICD-10-CM | POA: Insufficient documentation

## 2021-10-29 DIAGNOSIS — C50411 Malignant neoplasm of upper-outer quadrant of right female breast: Secondary | ICD-10-CM | POA: Insufficient documentation

## 2021-10-29 DIAGNOSIS — Z170 Estrogen receptor positive status [ER+]: Secondary | ICD-10-CM | POA: Insufficient documentation

## 2021-10-29 NOTE — Patient Instructions (Addendum)
Lubrication Used for intercourse to reduce friction Avoid ones that have glycerin, warming gels, tingling gels, icing or cooling gel, scented Avoid parabens due to a preservative similar to female sex hormone May need to be reapplied once or several times during sexual activity Can be applied to both partners genitals prior to vaginal penetration to minimize friction or irritation Prevent irritation and mucosal tears that cause post coital pain and increased the risk of vaginal and urinary tract infections Oil-based lubricants cannot be used with condoms due to breaking them down.  Least likely to irritate vaginal tissue.  Plant based-lubes are safe Silicone-based lubrication are thicker and last long and used for post-menopausal women  Vaginal Lubricators Here is a list of some suggested lubricators you can use for intercourse. Use the most hypoallergenic product.  You can place on you or your partner.  Slippery Stuff ( water based) Sylk or Sliquid Natural H2O ( good  if frequent UTI's)- walmart, amazon Sliquid organics silk-(aloe and silicone based ) [employer] (www.blossom-organics.com)- (aloe based ) Coconut oil, olive oil -not good with condoms  PJur Woman Nude- (water based) amazon Uberlube- ( silicon) [place] has an organic one Yes lubricant- (water based and has plant oil based similar to silicone) [employer] Platinum-Silicone, Target, [facility] Olive and Bee intimate cream-  www.oliveandbee.com.au Pink - [employer] stuff Erosense Sync- walmart, amazon Coconu- FailLinks.co.uk  Things to avoid in lubricants are glycerin, warming gels, tingling gels, icing or cooling  gels, and scented gels.  Also avoid Vaseline. KY jelly, Replens, and Astroglide contain chlorhexidine which kills good bacteria(lactobacilli)  Things to avoid in the vaginal area Do not use things to irritate the vulvar area No lotions- see below Soaps you  can use :Aveeno, Calendula, Good  Clean Love cleanser if needed. Must be gentle No deodorants No douches Good to sleep without underwear to let the vaginal area to air out No scrubbing: spread the lips to let warm water rinse over labias and pat dry  Creams that can be used on the Vulva Area V [employer], [place] MoonMaid Botanical Pro-Meno Wild Yam Cream Coconut oil, olive oil Cleo by [employer] labial moisturizer -Amazon,  [place] Releveum ( lidocaine) or Desert [employer] Yes Moisturizer       Moisturizers They are used in the vagina to hydrate the mucous membrane that make up the vaginal canal. Designed to keep a more normal acid balance (ph) Once placed in the vagina, it will last between two to three days.  Use 2-3 times per week at bedtime  Ingredients to avoid is glycerin and fragrance, can increase chance of infection Should not be used just before sex due to causing irritation Most are gels administered either in a tampon-shaped applicator or as a vaginal suppository. They are non-hormonal.   Types of Moisturizers(internal use)  Vitamin E vaginal suppositories- Whole foods, Amazon Moist Again Coconut oil- can break down condoms Julva- (Do no use if on Tamoxifen) amazon Yes moisturizer- amazon NeuEve Silk , NeuEve Silver for menopausal or over 65 (if have severe vaginal atrophy or cancer treatments use NeuEve Silk for  1 month than move to The Pepsi)- [employer], [place] Olive and Bee intimate cream- www.oliveandbee.com.au Mae vaginal moisturizer- Amazon Aloe    Creams to use externally on the Vulva area Albertson's (good for for cancer patients that had radiation to the area)- [place] or [employer].FlyingBasics.com.br V-magic cream - amazon Julva-amazon Vital "[place]  salve ( help moisturize and help with thinning vulvar area, does have [place] by Irwin Brakeman labial  moisturizer ([place],  Coconut or olive oil aloe   Things to avoid in the vaginal area Do not use things to irritate the vulvar area No lotions just specialized creams for the vulva area- Neogyn, V-magic, No soaps; can use Aveeno or Calendula cleanser if needed. Must be gentle No deodorants No douches Good to sleep without underwear to let the vaginal area to air out No scrubbing: spread the lips to let warm water rinse over labias and pat dry   Pelvic Floor Strengthening:  Do 3x10 Kegels throughout the day  Do 2M33 Quick Flicks throughout the day (contract and relax quickly back to back) Do 3x10 contractions with a 5 second hold each time *You can put a hand on your abdomen and one on your bottom when doing these. There shouldn't be large movements here when trying to do Kegels. Kegels should be isolated to the pelvic floor *Do one round of 10 at morning, lunch, and evening.

## 2021-10-29 NOTE — Therapy (Signed)
[facility] @ [address] [place], [place], [address] Phone: [phone]   Fax:  [phone]  Physical Therapy Evaluation  Patient Details  Name: Diane Miranda MRN: [id number] Date of Birth: [birth year] Referring Provider (PT): Magrinat, Virgie Dad, MD   Encounter Date: 10/29/2021   PT End of Session - 10/29/21 1314     Visit Number 1    Date for PT Re-Evaluation 01/29/22    Authorization Type [employer]    Authorization - Number of Visits 48    PT Start Time 1237    PT Stop Time 1315    PT Time Calculation (min) 38 min    Activity Tolerance Patient tolerated treatment well    Behavior During Therapy [facility] for tasks assessed/performed             Past Medical History:  Diagnosis Date   Allergy 2000   Asthma    as child   Breast cancer ([facility] [place])    Family history of lung cancer 03/08/2021   Family history of uterine cancer 03/08/2021   GERD (gastroesophageal reflux disease)    Headache    History of COVID-19    Hyperlipidemia    Hypertension    Pre-diabetes    Prediabetes    right breast ca dx'd 02/2021   Trimalleolar fracture of ankle, closed, left, initial encounter     Past Surgical History:  Procedure Laterality Date   BREAST BIOPSY Right 03/22/2021   Procedure: PUNCH BIOPSY OF RIGHT AREOLA;  Surgeon: Stark Klein, MD;  Location: [facility];  Service: General;  Laterality: Right;   BREAST LUMPECTOMY WITH RADIOACTIVE SEED AND SENTINEL LYMPH NODE BIOPSY Right 08/29/2021   Procedure: RIGHT BREAST LUMPECTOMY WITH RADIOACTIVE SEED AND RIGHT AXILLARY SENTINEL LYMPH NODE BIOPSY;  Surgeon: Stark Klein, MD;  Location: [facility];  Service: General;  Laterality: Right;   FRACTURE SURGERY  2018   NO PAST SURGERIES     ORIF ANKLE FRACTURE Left 07/10/2017   Procedure: OPEN REDUCTION INTERNAL FIXATION (ORIF) trimallolar ANKLE FRACTURE;  Surgeon: Wylene Simmer, MD;  Location: [facility];  Service:  Orthopedics;  Laterality: Left;   PORTACATH PLACEMENT Left 03/22/2021   Procedure: INSERTION PORT-A-CATH;  Surgeon: Stark Klein, MD;  Location: [facility];  Service: General;  Laterality: Left;   RADIOACTIVE SEED GUIDED AXILLARY SENTINEL LYMPH NODE Right 08/29/2021   Procedure: RADIOACTIVE SEED GUIDED AXILLARY SENTINEL LYMPH NODE EXCISION;  Surgeon: Stark Klein, MD;  Location: [facility];  Service: General;  Laterality: Right;    There were no vitals filed for this visit.    Subjective Assessment - 10/29/21 1240     Subjective Pt reports she has been having vaginal dryness since starting chemotherapy 03/2021 - 7/22 and though it hasn't gotten worse it also hasn't gotten better. No pain with intercourse with use of lubricant but reports does have "irritation".    Pertinent History 02/15/21 breast cancer; now post chemo and rt lumpectomy    How long can you sit comfortably? no limits    How long can you stand comfortably? no limits    How long can you walk comfortably? no limits    Patient Stated Goals to have no vaginal dryness    Currently in Pain? No/denies                [facility] PT Assessment - 10/29/21 0001       Assessment   Medical Diagnosis C50.411,Z17.0 (ICD-10-CM) - Malignant  neoplasm of upper-outer quadrant of right breast in female, estrogen receptor positive [facility])    Referring Provider (PT) Magrinat, Virgie Dad, MD    Onset Date/Surgical Date 08/29/21   Rt lumpectomy   Hand Dominance Right    Prior Therapy yes at breast cancer center      Precautions   Precautions Other (comment)    Precaution Comments recent surgery; right arm lymphedema risk      Restrictions   Weight Bearing Restrictions No      Balance Screen   Has the patient fallen in the past 6 months No    Has the patient had a decrease in activity level because of a fear of falling?  No    Is the patient reluctant to leave their home because of a fear of falling?  No      Home Environment    Living Environment Private residence    Living Arrangements Other relatives   Grandmother, teenage kids 87 and 76 y.o.   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, mining    Leisure She walks 2x/week for 30 minutes; read; travel      Cognition   Overall Cognitive Status Within Functional Limits for tasks assessed      Sensation   Light Touch Appears Intact      Coordination   Gross Motor Movements are Fluid and Coordinated Yes    Fine Motor Movements are Fluid and Coordinated Yes      Posture/Postural Control   Posture/Postural Control Postural limitations    Postural Limitations Rounded Shoulders;Forward head      ROM / Strength   AROM / PROM / Strength AROM;Strength      Strength   Overall Strength Comments [facility]      Flexibility   Soft Tissue Assessment /Muscle Length yes                        Objective measurements completed on examination: See above findings.     Pelvic Floor Special Questions - 10/29/21 0001     Prior Pelvic/Prostate Exam No    Are you Pregnant or attempting pregnancy? No    Prior Pregnancies Yes    Number of Pregnancies 2    Number of Vaginal Deliveries 2    Any difficulty with labor and deliveries No    Episiotomy Performed No    Currently Sexually Active Yes    Is this Painful Yes    Marinoff Scale pain interrupts completion   however if she did not use lubricant it would stop her from attempting. Does feel like it is much more difficult to have an orgasm.   Urinary Leakage Yes    How often not frequent but does with stressors    Pad use no    Activities that cause leaking Coughing;Sneezing;Laughing    Urinary urgency No    Urinary frequency no    Fecal incontinence No    Fluid intake yes enough    Caffeine beverages not alot per pt one sweet tea per week    Falling out  feeling (prolapse) No    External Perineal Exam WFL, does have Vitiligo    Pelvic Floor Internal Exam patient identified and patient confirms consent for PT to perform internal soft tissue work and muscle strength  and integrity assessment    Exam Type Vaginal    Sensation WFL    Palpation no TTP    Strength fair squeeze, definite lift    Strength # of reps 4    Strength # of seconds 3    Tone fair                       PT Education - 10/29/21 1314     Education Details Pt educated on exam findings, vaginal lubricants and moisturizers, kegel HEP and POC    Person(s) Educated Patient    Methods Explanation;Demonstration;Tactile cues;Verbal cues;Handout    Comprehension Verbalized understanding;Returned demonstration              PT Short Term Goals - 10/29/21 1319       PT SHORT TERM GOAL #1   Title Pt to be I with HEP    Time 4    Period Weeks    Status New    Target Date 11/26/21      PT SHORT TERM GOAL #2   Title pt to demonstrate 4/5 pelvic floor strength to decrease leakage symptoms.    Time 4    Period Weeks    Status New    Target Date 11/26/21               PT Long Term Goals - 10/29/21 1320       PT LONG TERM GOAL #1   Title pt to be I with advanced HEP    Time 3    Period Months    Status New    Target Date 01/29/22      PT LONG TERM GOAL #2   Title pt to demonstrate 5/5 pelvic floor strength for decreased urinary leakage symptoms.    Time 3    Period Months    Status New    Target Date 01/29/22      PT LONG TERM GOAL #3   Title pt to report no more than 1 urinary leak per month to improve QOL and leakage symptoms    Time 3    Period Months    Status New    Target Date 01/29/22      PT LONG TERM GOAL #4   Title pt to report improved with vaginal dryness symptoms, no longer preventing attempts at intercourse and improved ability to achieve orgasm.    Time 3    Period Months    Status New    Target Date 01/29/22                     Plan - 10/29/21 1315     Clinical Impression Statement Pt is 45yo female presenting to clinic s/p right lumpectomy and sentinel node biopsy. She had 5 nodes removed (all negative) during surgery on 08/29/2021. Pt has ended chemo in 7/22 and since starting in 4/22 has had vaginal dryness which causes irritation with intercourse and needs to use lubricant, without lubricant use pain with sex would prevent her from having intercourse. Pt also reports it is also more difficult to achieve orgasm since chemo as well. Pt consented to internal assessment this date and found to have weakness in pelvic floor all quadrants, dryness, and decreased endurance and coordination of pelvic floor. Pt reports she does have urinary leakage with sneezing/laughing/coughing intermittently but not daily. Pt reports she doesn't have  urgency or frequency problems. Pt would benefit from additional PT for improved  leakage symptoms and decreased dryness for improved tolerance to intercourse.    Personal Factors and Comorbidities Comorbidity 1    Comorbidities post chemo currently getting raditation for Rt breast CA    Examination-Activity Limitations Continence    Examination-Participation Restrictions Interpersonal Relationship    Stability/Clinical Decision Making Stable/Uncomplicated    Clinical Decision Making Low    Rehab Potential Good    PT Frequency 1x / week    PT Duration 12 weeks    PT Treatment/Interventions ADLs/Self Care Home Management;Functional mobility training;Therapeutic activities;Therapeutic exercise;Neuromuscular re-education;Manual techniques;Patient/family education;Taping;Scar mobilization;Passive range of motion;Energy conservation    PT Next Visit Plan go over handouts, kegel training    Consulted and Agree with Plan of Care Patient             Patient will benefit from skilled therapeutic intervention in order to improve the following deficits and impairments:   Decreased coordination, Decreased endurance, Improper body mechanics, Decreased strength  Visit Diagnosis: Lack of coordination - Plan: PT plan of care cert/re-cert  Muscle weakness (generalized) - Plan: PT plan of care cert/re-cert     Problem List Patient Active Problem List   Diagnosis Date Noted   Port-A-Cath in place 09/11/2021   History of COVID-19    Genetic testing 03/27/2021   Family history of uterine cancer 03/08/2021   Family history of lung cancer 03/08/2021   Malignant neoplasm of upper-outer quadrant of right breast in female, estrogen receptor positive ([place]) 03/01/2021   SINUSITIS - ACUTE-NOS 09/07/2009   EXUDATIVE PHARYNGITIS 09/07/2009   LOW HDL 08/03/2008   HSV 08/01/2008   OBESITY 08/01/2008   ALLERGIC RHINITIS 08/01/2008   VAGINITIS 08/01/2008   HEADACHE 08/01/2008   No emotional/communication barriers or cognitive limitation. Patient is motivated to learn. Patient understands and agrees with treatment goals and plan. PT explains patient will be examined in standing, sitting, and lying down to see how their muscles and joints work. When they are ready, they will be asked to remove their underwear so PT can examine their perineum. The patient is also given the option of providing their own chaperone as one is not provided in our facility. The patient also has the right and is explained the right to defer or refuse any part of the evaluation or treatment including the internal exam. With the patient's consent, PT will use one gloved finger to gently assess the muscles of the pelvic floor, seeing how well it contracts and relaxes and if there is muscle symmetry. After, the patient will get dressed and PT and patient will discuss exam findings and plan of care. PT and patient discuss plan of care, schedule, attendance policy and HEP activities.   Stacy Gardner, PT, DPT 11/14/221:23 PM   [facility] @ [place]  [place], [place], [address] Phone: [phone]   Fax:  [phone]  Name: Diane Miranda MRN: [id number] Date of Birth: 22-Sep-1976

## 2021-10-30 ENCOUNTER — Ambulatory Visit
Admission: RE | Admit: 2021-10-30 | Discharge: 2021-10-30 | Disposition: A | Discharge status: Home / Self Care | Payer: No Typology Code available for payment source | Source: Ambulatory Visit | Attending: Radiation Oncology | Admitting: Radiation Oncology

## 2021-10-30 DIAGNOSIS — R279 Unspecified lack of coordination: Secondary | ICD-10-CM | POA: Diagnosis not present

## 2021-10-31 ENCOUNTER — Ambulatory Visit
Admission: RE | Admit: 2021-10-31 | Discharge: 2021-10-31 | Disposition: A | Discharge status: Home / Self Care | Payer: No Typology Code available for payment source | Source: Ambulatory Visit | Attending: Radiation Oncology | Admitting: Radiation Oncology

## 2021-10-31 ENCOUNTER — Other Ambulatory Visit: Payer: Self-pay

## 2021-10-31 DIAGNOSIS — R279 Unspecified lack of coordination: Secondary | ICD-10-CM | POA: Diagnosis not present

## 2021-11-01 ENCOUNTER — Ambulatory Visit
Admission: RE | Admit: 2021-11-01 | Discharge: 2021-11-01 | Disposition: A | Discharge status: Home / Self Care | Payer: No Typology Code available for payment source | Source: Ambulatory Visit | Attending: Radiation Oncology | Admitting: Radiation Oncology

## 2021-11-01 DIAGNOSIS — C50411 Malignant neoplasm of upper-outer quadrant of right female breast: Secondary | ICD-10-CM | POA: Diagnosis present

## 2021-11-01 DIAGNOSIS — Z170 Estrogen receptor positive status [ER+]: Secondary | ICD-10-CM | POA: Diagnosis present

## 2021-11-01 DIAGNOSIS — R279 Unspecified lack of coordination: Secondary | ICD-10-CM | POA: Diagnosis present

## 2021-11-01 DIAGNOSIS — M6281 Muscle weakness (generalized): Secondary | ICD-10-CM | POA: Diagnosis present

## 2021-11-02 ENCOUNTER — Other Ambulatory Visit: Payer: Self-pay

## 2021-11-02 ENCOUNTER — Ambulatory Visit
Admission: RE | Admit: 2021-11-02 | Discharge: 2021-11-02 | Disposition: A | Discharge status: Home / Self Care | Payer: No Typology Code available for payment source | Source: Ambulatory Visit | Attending: Radiation Oncology | Admitting: Radiation Oncology

## 2021-11-02 DIAGNOSIS — R279 Unspecified lack of coordination: Secondary | ICD-10-CM | POA: Diagnosis not present

## 2021-11-04 ENCOUNTER — Ambulatory Visit
Admission: RE | Admit: 2021-11-04 | Discharge: 2021-11-04 | Disposition: A | Discharge status: Home / Self Care | Payer: No Typology Code available for payment source | Source: Ambulatory Visit | Attending: Radiation Oncology | Admitting: Radiation Oncology

## 2021-11-04 DIAGNOSIS — R279 Unspecified lack of coordination: Secondary | ICD-10-CM | POA: Diagnosis not present

## 2021-11-05 ENCOUNTER — Ambulatory Visit: Payer: No Typology Code available for payment source

## 2021-11-05 ENCOUNTER — Ambulatory Visit: Admission: RE | Admit: 2021-11-05 | Payer: No Typology Code available for payment source | Source: Ambulatory Visit

## 2021-11-05 ENCOUNTER — Other Ambulatory Visit: Payer: Self-pay

## 2021-11-06 ENCOUNTER — Ambulatory Visit: Payer: No Typology Code available for payment source

## 2021-11-07 ENCOUNTER — Ambulatory Visit: Payer: No Typology Code available for payment source

## 2021-11-12 ENCOUNTER — Other Ambulatory Visit: Payer: Self-pay

## 2021-11-12 ENCOUNTER — Ambulatory Visit
Admission: RE | Admit: 2021-11-12 | Discharge: 2021-11-12 | Disposition: A | Discharge status: Home / Self Care | Payer: No Typology Code available for payment source | Source: Ambulatory Visit | Attending: Radiation Oncology | Admitting: Radiation Oncology

## 2021-11-12 DIAGNOSIS — R279 Unspecified lack of coordination: Secondary | ICD-10-CM | POA: Diagnosis not present

## 2021-11-13 ENCOUNTER — Inpatient Hospital Stay: Payer: No Typology Code available for payment source

## 2021-11-13 ENCOUNTER — Telehealth: Payer: Self-pay | Admitting: *Deleted

## 2021-11-13 ENCOUNTER — Ambulatory Visit
Admission: RE | Admit: 2021-11-13 | Discharge: 2021-11-13 | Disposition: A | Discharge status: Home / Self Care | Payer: No Typology Code available for payment source | Source: Ambulatory Visit | Attending: Radiation Oncology | Admitting: Radiation Oncology

## 2021-11-13 ENCOUNTER — Ambulatory Visit: Payer: No Typology Code available for payment source | Admitting: Radiation Oncology

## 2021-11-13 VITALS — BP 124/77 | HR 93 | Temp 98.5°F | Resp 18

## 2021-11-13 DIAGNOSIS — R279 Unspecified lack of coordination: Secondary | ICD-10-CM | POA: Diagnosis not present

## 2021-11-13 DIAGNOSIS — Z170 Estrogen receptor positive status [ER+]: Secondary | ICD-10-CM

## 2021-11-13 DIAGNOSIS — C50411 Malignant neoplasm of upper-outer quadrant of right female breast: Secondary | ICD-10-CM

## 2021-11-13 DIAGNOSIS — Z95828 Presence of other vascular implants and grafts: Secondary | ICD-10-CM

## 2021-11-13 LAB — CMP (CANCER CENTER ONLY)
ALT: 12 U/L (ref 0–44)
AST: 13 U/L — ABNORMAL LOW (ref 15–41)
Albumin: 3.5 g/dL (ref 3.5–5.0)
Alkaline Phosphatase: 53 U/L (ref 38–126)
Anion gap: 5 (ref 5–15)
BUN: 8 mg/dL (ref 6–20)
CO2: 27 mmol/L (ref 22–32)
Calcium: 8.3 mg/dL — ABNORMAL LOW (ref 8.9–10.3)
Chloride: 108 mmol/L (ref 98–111)
Creatinine: 0.88 mg/dL (ref 0.44–1.00)
GFR, Estimated: >60 mL/min (ref >60)
Glucose, Bld: 119 mg/dL — ABNORMAL HIGH (ref 70–99)
Potassium: 3.4 mmol/L — ABNORMAL LOW (ref 3.5–5.1)
Sodium: 140 mmol/L (ref 135–145)
Total Bilirubin: 0.4 mg/dL (ref 0.3–1.2)
Total Protein: 6.3 g/dL — ABNORMAL LOW (ref 6.5–8.1)

## 2021-11-13 LAB — CBC WITH DIFFERENTIAL/PLATELET
Abs Immature Granulocytes: 0.01 10*3/uL (ref 0.00–0.07)
Basophils Absolute: 0 10*3/uL (ref 0.0–0.1)
Basophils Relative: 0 %
Eosinophils Absolute: 0.3 10*3/uL (ref 0.0–0.5)
Eosinophils Relative: 6 %
HCT: 32.6 % — ABNORMAL LOW (ref 36.0–46.0)
Hemoglobin: 11.1 g/dL — ABNORMAL LOW (ref 12.0–15.0)
Immature Granulocytes: 0 %
Lymphocytes Relative: 17 %
Lymphs Abs: 1 10*3/uL (ref 0.7–4.0)
MCH: 31.5 pg (ref 26.0–34.0)
MCHC: 34 g/dL (ref 30.0–36.0)
MCV: 92.6 fL (ref 80.0–100.0)
Monocytes Absolute: 0.3 10*3/uL (ref 0.1–1.0)
Monocytes Relative: 5 %
Neutro Abs: 4.2 10*3/uL (ref 1.7–7.7)
Neutrophils Relative %: 72 %
Platelets: 198 10*3/uL (ref 150–400)
RBC: 3.52 MIL/uL — ABNORMAL LOW (ref 3.87–5.11)
RDW: 14 % (ref 11.5–15.5)
WBC: 5.9 10*3/uL (ref 4.0–10.5)
nRBC: 0 % (ref 0.0–0.2)

## 2021-11-13 MED ORDER — SODIUM CHLORIDE 0.9 % IV SOLN: Freq: Once | INTRAVENOUS | Status: DC

## 2021-11-13 MED ORDER — TRASTUZUMAB-ANNS CHEMO 150 MG IV SOLR
6.0000 mg/kg | Freq: Once | INTRAVENOUS | Status: AC
  Administered 2021-11-13: 609 mg via INTRAVENOUS
  Filled 2021-11-13: qty 29

## 2021-11-13 MED ORDER — SODIUM CHLORIDE 0.9 % IV SOLN: Freq: Once | INTRAVENOUS | Status: AC

## 2021-11-13 MED ORDER — SODIUM CHLORIDE 0.9% FLUSH
10.0000 mL | INTRAVENOUS | Status: DC | PRN
  Administered 2021-11-13: 10 mL

## 2021-11-13 MED ORDER — SODIUM CHLORIDE 0.9% FLUSH
10.0000 mL | Freq: Once | INTRAVENOUS | Status: AC
  Administered 2021-11-13: 10 mL

## 2021-11-13 MED ORDER — SODIUM CHLORIDE 0.9 % IV SOLN
420.0000 mg | Freq: Once | INTRAVENOUS | Status: AC
  Administered 2021-11-13: 420 mg via INTRAVENOUS
  Filled 2021-11-13: qty 14

## 2021-11-13 MED ORDER — HEPARIN SOD (PORK) LOCK FLUSH 100 UNIT/ML IV SOLN
500.0000 [IU] | Freq: Once | INTRAVENOUS | Status: AC | PRN
  Administered 2021-11-13: 500 [IU]

## 2021-11-13 MED ORDER — DIPHENHYDRAMINE HCL 25 MG PO CAPS
25.0000 mg | ORAL_CAPSULE | Freq: Once | ORAL | Status: AC
  Administered 2021-11-13: 25 mg via ORAL

## 2021-11-13 MED ORDER — ACETAMINOPHEN 325 MG PO TABS
650.0000 mg | ORAL_TABLET | Freq: Once | ORAL | Status: AC
  Administered 2021-11-13: 650 mg via ORAL

## 2021-11-13 NOTE — Patient Instructions (Signed)
[facility] ONCOLOGY  Discharge Instructions: Thank you for choosing [facility] to provide your oncology and hematology care.   If you have a lab appointment with the [facility], please go directly to the [facility] and check in at the registration area.   Wear comfortable clothing and clothing appropriate for easy access to any Portacath or PICC line.   We strive to give you quality time with your provider. You may need to reschedule your appointment if you arrive late (15 or more minutes).  Arriving late affects you and other patients whose appointments are after yours.  Also, if you miss three or more appointments without notifying the office, you may be dismissed from the clinic at the provider's discretion.      For prescription refill requests, have your pharmacy contact our office and allow 72 hours for refills to be completed.    Today you received the following chemotherapy and/or immunotherapy agents Herceptin and perjeta      To help prevent nausea and vomiting after your treatment, we encourage you to take your nausea medication as directed.  BELOW ARE SYMPTOMS THAT SHOULD BE REPORTED IMMEDIATELY: *FEVER GREATER THAN 100.4 F (38 C) OR HIGHER *CHILLS OR SWEATING *NAUSEA AND VOMITING THAT IS NOT CONTROLLED WITH YOUR NAUSEA MEDICATION *UNUSUAL SHORTNESS OF BREATH *UNUSUAL BRUISING OR BLEEDING *URINARY PROBLEMS (pain or burning when urinating, or frequent urination) *BOWEL PROBLEMS (unusual diarrhea, constipation, pain near the anus) TENDERNESS IN MOUTH AND THROAT WITH OR WITHOUT PRESENCE OF ULCERS (sore throat, sores in mouth, or a toothache) UNUSUAL RASH, SWELLING OR PAIN  UNUSUAL VAGINAL DISCHARGE OR ITCHING   Items with * indicate a potential emergency and should be followed up as soon as possible or go to the Emergency Department if any problems should occur.  Please show the CHEMOTHERAPY ALERT CARD or IMMUNOTHERAPY ALERT CARD at  check-in to the Emergency Department and triage nurse.  Should you have questions after your visit or need to cancel or reschedule your appointment, please contact [facility]  Dept: [phone]  and follow the prompts.  Office hours are 8:00 a.m. to 4:30 p.m. Monday - Friday. Please note that voicemails left after 4:00 p.m. may not be returned until the following business day.  We are closed weekends and major holidays. You have access to a nurse at all times for urgent questions. Please call the main number to the clinic Dept: [phone] and follow the prompts.   For any non-urgent questions, you may also contact your provider using MyChart. We now offer e-Visits for anyone 60 and older to request care online for non-urgent symptoms. For details visit [email or web address].   Also download the MyChart app! Go to the app store, search "MyChart", open the app, select [facility], and log in with your MyChart username and password.  Due to Covid, a mask is required upon entering the hospital/clinic. If you do not have a mask, one will be given to you upon arrival. For doctor visits, patients may have 1 support person aged 34 or older with them. For treatment visits, patients cannot have anyone with them due to current Covid guidelines and our immunocompromised population.

## 2021-11-13 NOTE — Telephone Encounter (Signed)
11/12/2021 voicemail "This is a message for Dr. Jana Hakim regarding mutual patient Diane Miranda [birth year].  Calling to see if we can get additional information for disability claim.  Call back at [phone].  The first available representative will be happy to assist you.  Thank you.  Have a good day."

## 2021-11-13 NOTE — Progress Notes (Signed)
Pt declined to stay for 30 min observation post perjeta infusion. VSS, pt discharged in stable condition, ambulatory to lobby, no further questions or concerns at this time.

## 2021-11-14 ENCOUNTER — Encounter: Payer: Self-pay | Admitting: Oncology

## 2021-11-14 ENCOUNTER — Other Ambulatory Visit: Payer: Self-pay

## 2021-11-14 ENCOUNTER — Ambulatory Visit
Admission: RE | Admit: 2021-11-14 | Discharge: 2021-11-14 | Disposition: A | Discharge status: Home / Self Care | Payer: No Typology Code available for payment source | Source: Ambulatory Visit | Attending: Radiation Oncology | Admitting: Radiation Oncology

## 2021-11-14 ENCOUNTER — Ambulatory Visit: Payer: No Typology Code available for payment source

## 2021-11-14 DIAGNOSIS — R279 Unspecified lack of coordination: Secondary | ICD-10-CM | POA: Diagnosis not present

## 2021-11-14 NOTE — Telephone Encounter (Addendum)
Phone authorization received from [place] [phone]) to speak with [place] [employer].  "I spoke with [place], a different company from the forms [employer] completed 10/23/2021.  Advised [employer] to call office instead of sending forms to obtain medical information needed.  I have been out of work since 3/25/222.  Treatment expected through April 2023 but I may try to return February."     Connected with representative Alex, [place] [phone]).   Answered questions below during call. Patient phone number [phone]). Diagnosis codes for disability C50.411, Z17.0. First date seen by Dr. Jana Hakim was 03/07/2021.   No prior treatment by this provider before 03/07/2021.   Date advised out of work 03/09/2021. No, Shakeila RIHANNA MARSEILLE has not been released to return to work.   Undetermined when able to return; dependent upon response to treatment.. Continues treatment of Trastuzumab + Pertuzumab. Lymphedema precautions provided as Restrictions/limitations include no lift, push, pull greater than ten pounds.  Avoid stagnancy as in long car or plane rides.  Avoid vigorous, repetitive activities like typing, vacuuming.   Receiving daily radiation with next appointment with medical oncology provider 12/04/2021. Alex currently denies further questions or needs.

## 2021-11-15 ENCOUNTER — Ambulatory Visit
Admission: RE | Admit: 2021-11-15 | Discharge: 2021-11-15 | Disposition: A | Discharge status: Home / Self Care | Payer: No Typology Code available for payment source | Source: Ambulatory Visit | Attending: Radiation Oncology | Admitting: Radiation Oncology

## 2021-11-15 ENCOUNTER — Ambulatory Visit: Payer: No Typology Code available for payment source

## 2021-11-15 DIAGNOSIS — Z170 Estrogen receptor positive status [ER+]: Secondary | ICD-10-CM | POA: Insufficient documentation

## 2021-11-15 DIAGNOSIS — C50411 Malignant neoplasm of upper-outer quadrant of right female breast: Secondary | ICD-10-CM | POA: Insufficient documentation

## 2021-11-16 ENCOUNTER — Ambulatory Visit
Admission: RE | Admit: 2021-11-16 | Discharge: 2021-11-16 | Disposition: A | Discharge status: Home / Self Care | Payer: No Typology Code available for payment source | Source: Ambulatory Visit | Attending: Radiation Oncology | Admitting: Radiation Oncology

## 2021-11-16 ENCOUNTER — Other Ambulatory Visit: Payer: Self-pay

## 2021-11-16 ENCOUNTER — Ambulatory Visit: Payer: No Typology Code available for payment source

## 2021-11-19 ENCOUNTER — Other Ambulatory Visit: Payer: Self-pay

## 2021-11-19 ENCOUNTER — Ambulatory Visit: Payer: No Typology Code available for payment source

## 2021-11-19 ENCOUNTER — Ambulatory Visit
Admission: RE | Admit: 2021-11-19 | Discharge: 2021-11-19 | Disposition: A | Discharge status: Home / Self Care | Payer: No Typology Code available for payment source | Source: Ambulatory Visit | Attending: Radiation Oncology | Admitting: Radiation Oncology

## 2021-11-20 ENCOUNTER — Ambulatory Visit
Admission: RE | Admit: 2021-11-20 | Discharge: 2021-11-20 | Disposition: A | Discharge status: Home / Self Care | Payer: Self-pay | Source: Ambulatory Visit | Attending: Radiation Oncology | Admitting: Radiation Oncology

## 2021-11-20 ENCOUNTER — Encounter: Payer: Self-pay | Admitting: Oncology

## 2021-11-20 ENCOUNTER — Ambulatory Visit: Payer: No Typology Code available for payment source | Admitting: Radiation Oncology

## 2021-11-21 ENCOUNTER — Ambulatory Visit: Payer: No Typology Code available for payment source

## 2021-11-21 ENCOUNTER — Ambulatory Visit
Admission: RE | Admit: 2021-11-21 | Discharge: 2021-11-21 | Disposition: A | Discharge status: Home / Self Care | Payer: No Typology Code available for payment source | Source: Ambulatory Visit | Attending: Radiation Oncology | Admitting: Radiation Oncology

## 2021-11-21 ENCOUNTER — Other Ambulatory Visit: Payer: Self-pay

## 2021-11-21 DIAGNOSIS — C50411 Malignant neoplasm of upper-outer quadrant of right female breast: Secondary | ICD-10-CM

## 2021-11-22 ENCOUNTER — Ambulatory Visit: Payer: No Typology Code available for payment source

## 2021-11-22 ENCOUNTER — Ambulatory Visit: Payer: Medicaid Other | Attending: General Surgery | Admitting: Physical Therapy

## 2021-11-22 ENCOUNTER — Encounter: Payer: Self-pay | Admitting: Cardiology

## 2021-11-22 ENCOUNTER — Ambulatory Visit: Payer: Medicaid Other | Admitting: Cardiology

## 2021-11-22 VITALS — BP 148/97 | HR 100 | Temp 98.0°F | Resp 15 | Ht 64.0 in | Wt 227.0 lb

## 2021-11-22 DIAGNOSIS — Z8616 Personal history of COVID-19: Secondary | ICD-10-CM

## 2021-11-22 DIAGNOSIS — I10 Essential (primary) hypertension: Secondary | ICD-10-CM

## 2021-11-22 DIAGNOSIS — M6281 Muscle weakness (generalized): Secondary | ICD-10-CM | POA: Insufficient documentation

## 2021-11-22 DIAGNOSIS — E782 Mixed hyperlipidemia: Secondary | ICD-10-CM

## 2021-11-22 DIAGNOSIS — Z170 Estrogen receptor positive status [ER+]: Secondary | ICD-10-CM

## 2021-11-22 DIAGNOSIS — C50411 Malignant neoplasm of upper-outer quadrant of right female breast: Secondary | ICD-10-CM

## 2021-11-22 DIAGNOSIS — R279 Unspecified lack of coordination: Secondary | ICD-10-CM | POA: Insufficient documentation

## 2021-11-22 MED ORDER — LOSARTAN POTASSIUM 50 MG PO TABS: 50.0000 mg | ORAL_TABLET | Freq: Every evening | ORAL | 0 refills | Status: DC

## 2021-11-22 MED ORDER — METOPROLOL SUCCINATE ER 100 MG PO TB24: 100.0000 mg | ORAL_TABLET | Freq: Every morning | ORAL | 0 refills | Status: DC

## 2021-11-22 NOTE — Progress Notes (Signed)
Date:  11/22/2021   ID:  Damariz JERICKA KADAR, DOB 06-17-1976, MRN [id number]  PCP:  [name], MD  Cardiologist:  Rex Kras, DO, [facility] (established care 04/12/2021)  Date: 11/22/21 Last Office Visit: 08/24/2021  Chief Complaint  Patient presents with   Tachycardia   Follow-up    3 Month   Hypertension    HPI  Diane Miranda is a 45 y.o. female who presents to the office with a chief complaint of " 52-monthfollow-up tachycardia/hypertension." Patient's past medical history and cardiovascular risk factors include: Hx of COVID 2021, right sided breast cancer (dx March 2022) s/p chemotherapy, hyperlipidemia, hypertension, GERD, obesity due to excess calorie.   She is referred to the office at the request of [place], RRalene Bathe MD for evaluation of tachycardia.  Initially referred to the office for evaluation of tachycardia after COVID-19 infection back in 2021.  Extended Holter monitor did not illustrate any significant dysrhythmias except normal sinus rhythm followed by sinus tachycardia.  Patient's pharmacological therapy was being uptitrated until she was diagnosed with breast cancer in March 2022.  Patient is medical therapy was transitioned to GDMT with the hopes of preventing cardiomyopathy.  Amlodipine was transitioned to lisinopril which was then later changed to losartan due to nonproductive cough.  Patient has been on metoprolol.  Since last office visit patient has undergone lumpectomy with removal of 3 lymph nodes and is currently undergoing radiation with 5 more treatments left.  She had a recent echo in October 2022 which notes preserved LVEF, global longitudinal strain remains within normal limits, and normal diastolic function.  Since last visit patient's palpitations have reduced in intensity, frequency, and duration.  Her home blood pressures are still not well controlled with systolic blood pressures being around 140 mmHg.  No hospitalizations or urgent care  visits for cardiovascular symptoms.  Most recent labs from 11/13/2021 independently reviewed.  She is planning to have fasting lipid profile done with her PCP in December/January 2022/2023.  FUNCTIONAL STATUS: Walks 30 to 40 minutes 3-4 times a week.  ALLERGIES: Allergies  Allergen Reactions   Hydrochlorothiazide Anaphylaxis and Swelling    MEDICATION LIST PRIOR TO VISIT: Current Meds  Medication Sig   acetaminophen (TYLENOL) 500 MG tablet Take 1 tablet (500 mg total) by mouth 3 (three) times daily with meals as needed. Take with aleve 220 mg tablet   Fexofenadine HCl (ALLEGRA ALLERGY PO) Take 1 tablet by mouth daily.   gabapentin (NEURONTIN) 300 MG capsule Take 1 capsule (300 mg total) by mouth at bedtime.   lidocaine-prilocaine (EMLA) cream Apply 1 application topically as needed ([facility].   loratadine (CLARITIN) 10 MG tablet Take 1 tablet (10 mg total) by mouth daily.   Multiple Vitamins-Minerals (MULTIVITAMIN WITH MINERALS) tablet Take 1 tablet by mouth daily.   rosuvastatin (CRESTOR) 10 MG tablet Take 10 mg by mouth daily.   [DISCONTINUED] losartan (COZAAR) 25 MG tablet Take 1 tablet (25 mg total) by mouth every morning.   [DISCONTINUED] metoprolol succinate (TOPROL XL) 50 MG 24 hr tablet Take 1 tablet (50 mg total) by mouth every morning.     PAST MEDICAL HISTORY: Past Medical History:  Diagnosis Date   Allergy 2000   Asthma    as child   [place]    Family history of lung cancer 03/08/2021   Family history of uterine cancer 03/08/2021   GERD (gastroesophageal reflux disease)    Headache    History of COVID-19    Hyperlipidemia  Hypertension    Pre-diabetes    Prediabetes    right breast ca dx'd 02/2021   Trimalleolar fracture of ankle, closed, left, initial encounter     PAST SURGICAL HISTORY: Past Surgical History:  Procedure Laterality Date   BREAST BIOPSY Right 03/22/2021   Procedure: PUNCH BIOPSY OF RIGHT AREOLA;  Surgeon: Stark Klein, MD;   Location: [place];  Service: General;  Laterality: Right;   BREAST LUMPECTOMY WITH RADIOACTIVE SEED AND SENTINEL LYMPH NODE BIOPSY Right 08/29/2021   Procedure: RIGHT BREAST LUMPECTOMY WITH RADIOACTIVE SEED AND RIGHT AXILLARY SENTINEL LYMPH NODE BIOPSY;  Surgeon: Stark Klein, MD;  Location: [place];  Service: General;  Laterality: Right;   FRACTURE SURGERY  2018   NO PAST SURGERIES     ORIF ANKLE FRACTURE Left 07/10/2017   Procedure: OPEN REDUCTION INTERNAL FIXATION (ORIF) trimallolar ANKLE FRACTURE;  Surgeon: Wylene Simmer, MD;  Location: [place];  Service: Orthopedics;  Laterality: Left;   PORTACATH PLACEMENT Left 03/22/2021   Procedure: INSERTION PORT-A-CATH;  Surgeon: Stark Klein, MD;  Location: [place];  Service: General;  Laterality: Left;   RADIOACTIVE SEED GUIDED AXILLARY SENTINEL LYMPH NODE Right 08/29/2021   Procedure: RADIOACTIVE SEED GUIDED AXILLARY SENTINEL LYMPH NODE EXCISION;  Surgeon: Stark Klein, MD;  Location: [place];  Service: General;  Laterality: Right;    FAMILY HISTORY: The patient family history includes Diabetes in her brother and father; Early death in her mother; Hearing loss in her maternal grandmother; Heart disease in her mother; Hypertension in her father, maternal grandmother, and mother; Lung cancer in an other family member; Throat cancer in an other family member; Uterine cancer (age of onset: 26) in an other family member.  SOCIAL HISTORY:  The patient  reports that she has never smoked. She has never used smokeless tobacco. She reports current alcohol use. She reports that she does not use drugs.  REVIEW OF SYSTEMS: Review of Systems  Constitutional: Negative for chills and fever.  HENT:  Negative for hoarse voice and nosebleeds.   Eyes:  Negative for discharge, double vision and pain.  Cardiovascular:  Positive for palpitations (Improved). Negative for chest pain, claudication, dyspnea on exertion, leg  swelling, near-syncope, orthopnea, paroxysmal nocturnal dyspnea and syncope.  Respiratory:  Negative for hemoptysis and shortness of breath.   Musculoskeletal:  Negative for muscle cramps and myalgias.  Gastrointestinal:  Negative for abdominal pain, constipation, diarrhea, hematemesis, hematochezia, melena, nausea and vomiting.  Neurological:  Negative for dizziness and light-headedness.   PHYSICAL EXAM: Vitals with BMI 11/22/2021 11/22/2021 11/13/2021  Height - _0  -  Weight - 227 lbs -  BMI - 95.62 -  Systolic 130 865 784  Diastolic 97 98 77  Pulse 696 96 93    CONSTITUTIONAL: Well-developed and well-nourished. No acute distress.  SKIN: Skin is warm and dry. No rash noted. No cyanosis. No pallor. No jaundice HEAD: Normocephalic and atraumatic.  EYES: No scleral icterus MOUTH/THROAT: Moist oral membranes.  NECK: No JVD present. No thyromegaly noted. No carotid bruits  LYMPHATIC: No visible cervical adenopathy.  CHEST Normal respiratory effort. No intercostal retractions  LUNGS: Clear to auscultation bilaterally.  No stridor. No wheezes. No rales.  CARDIOVASCULAR: Regular rate and rhythm, positive S1-S2, no murmurs rubs or gallops appreciated. ABDOMINAL: No apparent ascites.  EXTREMITIES: No peripheral edema  HEMATOLOGIC: No significant bruising NEUROLOGIC: Oriented to person, place, and time. Nonfocal. Normal muscle tone.  PSYCHIATRIC: Normal mood and affect. Normal behavior. Cooperative  CARDIAC DATABASE:  EKG: 11/22/2021: NSR, 94 bpm, nonspecific T wave abnormality, without underlying injury pattern.  Echocardiogram: 03/13/2021: LVEF 60-65%, no regional wall motion abnormalities, global longitudinal strain -24.7%, right ventricular size and function normal, no significant valvular heart disease, right atrial pressure approximately 3 mmHg.   06/27/2021: LVEF 65 to 16%, grade 1 diastolic impairment, average GLS -24.6%, mild LVH, normal right ventricular size and function, no  significant valvular heart disease.  09/25/2021: LVEF 60-65%, LVEF for 3D volumes 61%, no regional wall motion abnormalities, normal diastolic function, average GLS -24.2%, normal right ventricular size and function, no significant valvular heart disease, estimated RAP 3 mmHg.  Stress Testing: No results found for this or any previous visit from the past 1095 days.  Heart Catheterization: None  3 day extended Holter monitor: Dominant rhythm normal sinus rhythm. Heart rate 70-156 bpm.  Avg HR 102 bpm. No atrial fibrillation, ventricular tachycardia, high grade AV block, pauses (3 seconds or longer). Total ventricular ectopic burden <1%.   Total supraventricular ectopic burden <1%. Patient triggered events: 0.    LABORATORY DATA: CBC Latest Ref Rng & Units 11/13/2021 10/23/2021 10/02/2021  WBC 4.0 - 10.5 K/uL 5.9 5.8 6.3  Hemoglobin 12.0 - 15.0 g/dL 11.1(L) 11.8(L) 11.7(L)  Hematocrit 36.0 - 46.0 % 32.6(L) 33.9(L) 34.9(L)  Platelets 150 - 400 K/uL 198 205 235    CMP Latest Ref Rng & Units 11/13/2021 10/23/2021 10/02/2021  Glucose 70 - 99 mg/dL 119(H) 107(H) 100(H)  BUN 6 - 20 mg/dL _0 Creatinine 0.44 - 1.00 mg/dL 0.88 0.79 0.90  Sodium 135 - 145 mmol/L 140 142 142  Potassium 3.5 - 5.1 mmol/L 3.4(L) 3.8 3.9  Chloride 98 - 111 mmol/L 108 108 108  CO2 22 - 32 mmol/L _1 Calcium 8.9 - 10.3 mg/dL 8.3(L) 8.7(L) 9.6  Total Protein 6.5 - 8.1 g/dL 6.3(L) 6.4(L) 6.7  Total Bilirubin 0.3 - 1.2 mg/dL 0.4 0.5 0.5  Alkaline Phos 38 - 126 U/L 53 66 64  AST 15 - 41 U/L 13(L) 10(L) 11(L)  ALT 0 - 44 U/L _2 Lipid Panel     Component Value Date/Time   CHOL 168 08/03/2008 0927   TRIG 73 08/03/2008 0927   TRIG 104 11/12/2006 1022   HDL 26.4 (L) 08/03/2008 0927   CHOLHDL 6.4 CALC 08/03/2008 0927   VLDL 15 08/03/2008 0927   LDLCALC 127 (H) 08/03/2008 0927    No components found for: NTPROBNP No results for input(s): PROBNP in the last 8760 hours. No results for  input(s): TSH in the last 8760 hours.  BMP Recent Labs    10/02/21 0834 10/23/21 0931 11/13/21 0953  NA 142 142 140  K 3.9 3.8 3.4*  CL 108 108 108  CO2 _3 GLUCOSE 100* 107* 119*  BUN _4 CREATININE 0.90 0.79 0.88  CALCIUM 9.6 8.7* 8.3*  GFRNONAA >60 >60 >60    HEMOGLOBIN A1C Lab Results  Component Value Date   HGBA1C 4.9 08/15/2021   MPG 94 08/15/2021   External Labs:  Date Collected: 02/03/2021 , information obtained by PCP Potassium: 3.5 Creatinine 0.80 mg/dL. eGFR: 105 mL/min per 1.73 m Hemoglobin: 13.7 g/dL and hematocrit: 40.3 % Lipid profile: Total cholesterol 188 , triglycerides 159 , HDL 30 , LDL 131, non-HDL 158 AST: 8 , ALT: 6 , alkaline phosphatase: 52  Hemoglobin A1c: 5.7 TSH: 1.41   IMPRESSION:    ICD-10-CM   1. Benign hypertension  I10 EKG 12-Lead    losartan (COZAAR) 50 MG tablet    metoprolol succinate (TOPROL XL) 100 MG 24 hr tablet    Basic metabolic panel    2. Malignant neoplasm of upper-outer quadrant of right breast in female, estrogen receptor positive ([place])  C50.411    Z17.0     3. Mixed hyperlipidemia  E78.2     4. History of COVID-19  Z86.16     5. Class 2 severe obesity due to excess calories with serious comorbidity and body mass index (BMI) of 39.0 to 39.9 in adult [facility])  E66.01    Z68.39        RECOMMENDATIONS: Diane Miranda is a 45 y.o. female whose past medical history and cardiac risk factors include: Hx of COVID 2021, right sided breast cancer (dx March 2022) s/p chemotherapy, hyperlipidemia, hypertension, GERD, obesity due to excess calorie.   Benign hypertension Home blood pressures are improving but currently not at goal. Increase losartan to 50 mg p.o. daily. Increase metoprolol 200 mg p.o. daily. Check BMP in 1 week to evaluate kidney function and electrolytes. Reemphasized importance of a low-salt diet. Encouraged physical activity as tolerated with a goal of 30 minutes a day 5 days a  week.  Malignant neoplasm of upper-outer quadrant of right breast in female, estrogen receptor positive ([place]) Has undergone surgery and currently undergoing radiation therapy as discussed above Medical therapy has been transitioned to GDMT with the hopes of preventing/delaying the onset of cardiomyopathy. Uptitrate medical therapy as discussed above Recent echo from October 2022 notes preserved LVEF, normal diastolic function, and GLS within normal limits. Will follow peripherally.  Mixed hyperlipidemia Currently on statin therapy. Does not endorse myalgias. Will have repeat blood work with PCP either in December 2022 or January 2023.   Patient is asked to forward a copy to the office for review.  Class 2 severe obesity due to excess calories with serious comorbidity and body mass index (BMI) of 38.0 to 38.9 in adult [facility]) Body mass index is 38.96 kg/m. I reviewed with the patient the importance of diet, regular physical activity/exercise, weight loss.   Patient is educated on increasing physical activity gradually as tolerated.  With the goal of moderate intensity exercise for 30 minutes a day 5 days a week.  FINAL MEDICATION LIST END OF ENCOUNTER: Meds ordered this encounter  Medications   losartan (COZAAR) 50 MG tablet    Sig: Take 1 tablet (50 mg total) by mouth every evening.    Dispense:  30 tablet    Refill:  0   metoprolol succinate (TOPROL XL) 100 MG 24 hr tablet    Sig: Take 1 tablet (100 mg total) by mouth every morning.    Dispense:  30 tablet    Refill:  0     Medications Discontinued During This Encounter  Medication Reason   oxyCODONE (OXY IR/ROXICODONE) 5 MG immediate release tablet    ondansetron (ZOFRAN) 8 MG tablet    pantoprazole (PROTONIX) 20 MG tablet    prochlorperazine (COMPAZINE) 10 MG tablet    tobramycin-dexamethasone (TOBRADEX) ophthalmic solution    losartan (COZAAR) 25 MG tablet Reorder   metoprolol succinate (TOPROL XL) 50 MG 24 hr tablet  Reorder     Current Outpatient Medications:    acetaminophen (TYLENOL) 500 MG tablet, Take 1 tablet (500 mg total) by mouth 3 (three) times daily with meals as needed. Take with aleve 220 mg tablet, Disp: 90 tablet, Rfl: 0   Fexofenadine HCl (ALLEGRA  ALLERGY PO), Take 1 tablet by mouth daily., Disp: , Rfl:    gabapentin (NEURONTIN) 300 MG capsule, Take 1 capsule (300 mg total) by mouth at bedtime., Disp: 90 capsule, Rfl: 4   lidocaine-prilocaine (EMLA) cream, Apply 1 application topically as needed [facility])., Disp: , Rfl:    loratadine (CLARITIN) 10 MG tablet, Take 1 tablet (10 mg total) by mouth daily., Disp: 90 tablet, Rfl: 4   Multiple Vitamins-Minerals (MULTIVITAMIN WITH MINERALS) tablet, Take 1 tablet by mouth daily., Disp: , Rfl:    rosuvastatin (CRESTOR) 10 MG tablet, Take 10 mg by mouth daily., Disp: , Rfl:    losartan (COZAAR) 50 MG tablet, Take 1 tablet (50 mg total) by mouth every evening., Disp: 30 tablet, Rfl: 0   metoprolol succinate (TOPROL XL) 100 MG 24 hr tablet, Take 1 tablet (100 mg total) by mouth every morning., Disp: 30 tablet, Rfl: 0  Orders Placed This Encounter  Procedures   Basic metabolic panel   EKG 94-TMLY   There are no Patient Instructions on file for this visit.   --Continue cardiac medications as reconciled in final medication list. --Return in about 6 months (around 05/23/2022) for Follow up, BP. Or sooner if needed. --Continue follow-up with your primary care physician regarding the management of your other chronic comorbid conditions.  Patient's questions and concerns were addressed to her satisfaction. She voices understanding of the instructions provided during this encounter.   This note was created using a voice recognition software as a result there may be grammatical errors inadvertently enclosed that do not reflect the nature of this encounter. Every attempt is made to correct such errors.  Rex Kras, [place], [facility]  Pager: [phone] Office:  [phone]

## 2021-11-22 NOTE — Therapy (Signed)
[facility] @ [address] [place], [place], [address] Phone: [phone]   Fax:  [phone]  Physical Therapy Treatment  Patient Details  Name: Diane Miranda MRN: [id number] Date of Birth: [birth year] Referring Provider (PT): Magrinat, Virgie Dad, MD   Encounter Date: 11/22/2021   PT End of Session - 11/22/21 1302     Visit Number 2    Date for PT Re-Evaluation 01/29/22    Authorization Type [employer]    Authorization - Number of Visits 26    PT Start Time 2423    PT Stop Time 1310   pt reported she was fatigued and needed to rest   PT Time Calculation (min) 35 min    Activity Tolerance Patient tolerated treatment well    Behavior During Therapy [facility] for tasks assessed/performed             Past Medical History:  Diagnosis Date   Allergy 2000   Asthma    as child   Breast cancer ([place])    Family history of lung cancer 03/08/2021   Family history of uterine cancer 03/08/2021   GERD (gastroesophageal reflux disease)    Headache    History of COVID-19    Hyperlipidemia    Hypertension    Pre-diabetes    Prediabetes    right breast ca dx'd 02/2021   Trimalleolar fracture of ankle, closed, left, initial encounter     Past Surgical History:  Procedure Laterality Date   BREAST BIOPSY Right 03/22/2021   Procedure: PUNCH BIOPSY OF RIGHT AREOLA;  Surgeon: Stark Klein, MD;  Location: [place];  Service: General;  Laterality: Right;   BREAST LUMPECTOMY WITH RADIOACTIVE SEED AND SENTINEL LYMPH NODE BIOPSY Right 08/29/2021   Procedure: RIGHT BREAST LUMPECTOMY WITH RADIOACTIVE SEED AND RIGHT AXILLARY SENTINEL LYMPH NODE BIOPSY;  Surgeon: Stark Klein, MD;  Location: [place];  Service: General;  Laterality: Right;   FRACTURE SURGERY  2018   NO PAST SURGERIES     ORIF ANKLE FRACTURE Left 07/10/2017   Procedure: OPEN REDUCTION INTERNAL FIXATION (ORIF) trimallolar ANKLE FRACTURE;  Surgeon: Wylene Simmer, MD;   Location: [place];  Service: Orthopedics;  Laterality: Left;   PORTACATH PLACEMENT Left 03/22/2021   Procedure: INSERTION PORT-A-CATH;  Surgeon: Stark Klein, MD;  Location: [place];  Service: General;  Laterality: Left;   RADIOACTIVE SEED GUIDED AXILLARY SENTINEL LYMPH NODE Right 08/29/2021   Procedure: RADIOACTIVE SEED GUIDED AXILLARY SENTINEL LYMPH NODE EXCISION;  Surgeon: Stark Klein, MD;  Location: [place];  Service: General;  Laterality: Right;    There were no vitals filed for this visit.   Subjective Assessment - 11/22/21 1243     Subjective Pt reports she thinks the dryness is getting better with use of vaginal mositurizer, can't remember the name. Pt does have skin irritation with radiation at Rt side. Pt requests to do more strengthening today within energy levels.    Pertinent History 02/15/21 breast cancer; now post chemo and rt lumpectomy    How long can you sit comfortably? no limits    How long can you stand comfortably? no limits    How long can you walk comfortably? no limits    Patient Stated Goals to have no vaginal dryness    Currently in Pain? No/denies  [place] Adult PT Treatment/Exercise - 11/22/21 0001       Exercises   Exercises Lumbar;Knee/Hip      Lumbar Exercises: Standing   Other Standing Lumbar Exercises sit<>stand 2x10 5# kettle bell with exhale      Lumbar Exercises: Supine   Clam 20 reps    Clam Limitations 2x10 red loop and breathing mechanics    Other Supine Lumbar Exercises opp arm/knee press with exhale 2x10; ball squeeze 2x10    Other Supine Lumbar Exercises hip flexion red loop 2x10; bil shoulder horizontal abduction with TA activation red band 2x10      Lumbar Exercises: Quadruped   [place]/Old Horse 20 reps                     PT Education - 11/22/21 1259     Education Details Pt educated on HEP, technique for all exercises and breathing  mechanics    Person(s) Educated Patient    Methods Explanation;Demonstration;Tactile cues;Verbal cues;Handout    Comprehension Verbalized understanding;Returned demonstration              PT Short Term Goals - 10/29/21 1319       PT SHORT TERM GOAL #1   Title Pt to be I with HEP    Time 4    Period Weeks    Status New    Target Date 11/26/21      PT SHORT TERM GOAL #2   Title pt to demonstrate 4/5 pelvic floor strength to decrease leakage symptoms.    Time 4    Period Weeks    Status New    Target Date 11/26/21               PT Long Term Goals - 10/29/21 1320       PT LONG TERM GOAL #1   Title pt to be I with advanced HEP    Time 3    Period Months    Status New    Target Date 01/29/22      PT LONG TERM GOAL #2   Title pt to demonstrate 5/5 pelvic floor strength for decreased urinary leakage symptoms.    Time 3    Period Months    Status New    Target Date 01/29/22      PT LONG TERM GOAL #3   Title pt to report no more than 1 urinary leak per month to improve QOL and leakage symptoms    Time 3    Period Months    Status New    Target Date 01/29/22      PT LONG TERM GOAL #4   Title pt to report improved with vaginal dryness symptoms, no longer preventing attempts at intercourse and improved ability to achieve orgasm.    Time 3    Period Months    Status New    Target Date 01/29/22                   Plan - 11/22/21 1359     Clinical Impression Statement Pt presents to session reported she felt ok but fatigued as this was fourth stop/appointment today. Pt reports she thinks dryness has been improving and continues to do kegels at home. Pt session focused on strengthening and increased tolerance to activity. Pt tolerated well and benefit from rest breaks and cues for technique. Pt would benefit from additional PT for decreased dryness for improved tolerance to intercourse and improved global strength for pelvic  stability.    Personal Factors  and Comorbidities Comorbidity 1    Comorbidities post chemo currently getting raditation for Rt breast CA    Examination-Activity Limitations Continence    Examination-Participation Restrictions Interpersonal Relationship    Stability/Clinical Decision Making Stable/Uncomplicated    Rehab Potential Good    PT Frequency 1x / week    PT Duration 12 weeks    PT Treatment/Interventions ADLs/Self Care Home Management;Functional mobility training;Therapeutic activities;Therapeutic exercise;Neuromuscular re-education;Manual techniques;Patient/family education;Taping;Scar mobilization;Passive range of motion;Energy conservation    PT Next Visit Plan go over handouts, kegel training    Consulted and Agree with Plan of Care Patient             Patient will benefit from skilled therapeutic intervention in order to improve the following deficits and impairments:  Decreased coordination, Decreased endurance, Improper body mechanics, Decreased strength  Visit Diagnosis: Muscle weakness (generalized)  Lack of coordination     Problem List Patient Active Problem List   Diagnosis Date Noted   Port-A-Cath in place 09/11/2021   History of COVID-19    Genetic testing 03/27/2021   Family history of uterine cancer 03/08/2021   Family history of lung cancer 03/08/2021   Malignant neoplasm of upper-outer quadrant of right breast in female, estrogen receptor positive ([place]) 03/01/2021   SINUSITIS - ACUTE-NOS 09/07/2009   EXUDATIVE PHARYNGITIS 09/07/2009   LOW HDL 08/03/2008   HSV 08/01/2008   OBESITY 08/01/2008   ALLERGIC RHINITIS 08/01/2008   VAGINITIS 08/01/2008   HEADACHE 08/01/2008   Stacy Gardner, PT, DPT 12/08/222:02 PM   [place] @ [place], [place], [address] Phone: [phone]   Fax:  [phone]  Name: Diane Miranda MRN: [id number] Date of Birth: 09-02-1976

## 2021-11-23 ENCOUNTER — Other Ambulatory Visit: Payer: Self-pay

## 2021-11-23 ENCOUNTER — Ambulatory Visit: Payer: No Typology Code available for payment source

## 2021-11-23 ENCOUNTER — Ambulatory Visit
Admission: RE | Admit: 2021-11-23 | Discharge: 2021-11-23 | Disposition: A | Discharge status: Home / Self Care | Payer: No Typology Code available for payment source | Source: Ambulatory Visit | Attending: Radiation Oncology | Admitting: Radiation Oncology

## 2021-11-26 ENCOUNTER — Other Ambulatory Visit: Payer: Self-pay

## 2021-11-26 ENCOUNTER — Ambulatory Visit: Payer: No Typology Code available for payment source

## 2021-11-26 ENCOUNTER — Ambulatory Visit
Admission: RE | Admit: 2021-11-26 | Discharge: 2021-11-26 | Disposition: A | Discharge status: Home / Self Care | Payer: No Typology Code available for payment source | Source: Ambulatory Visit | Attending: Radiation Oncology | Admitting: Radiation Oncology

## 2021-11-26 DIAGNOSIS — I10 Essential (primary) hypertension: Secondary | ICD-10-CM

## 2021-11-26 MED ORDER — LOSARTAN POTASSIUM 50 MG PO TABS: 50.0000 mg | ORAL_TABLET | Freq: Every evening | ORAL | 3 refills | Status: DC

## 2021-11-27 ENCOUNTER — Ambulatory Visit
Admission: RE | Admit: 2021-11-27 | Discharge: 2021-11-27 | Disposition: A | Discharge status: Home / Self Care | Payer: No Typology Code available for payment source | Source: Ambulatory Visit | Attending: Radiation Oncology | Admitting: Radiation Oncology

## 2021-11-27 ENCOUNTER — Encounter: Payer: Self-pay | Admitting: *Deleted

## 2021-11-27 ENCOUNTER — Ambulatory Visit: Payer: No Typology Code available for payment source

## 2021-11-28 ENCOUNTER — Ambulatory Visit
Admission: RE | Admit: 2021-11-28 | Discharge: 2021-11-28 | Disposition: A | Discharge status: Home / Self Care | Payer: No Typology Code available for payment source | Source: Ambulatory Visit | Attending: Radiation Oncology | Admitting: Radiation Oncology

## 2021-11-28 ENCOUNTER — Other Ambulatory Visit: Payer: Self-pay

## 2021-11-29 ENCOUNTER — Encounter: Payer: Self-pay | Admitting: Radiation Oncology

## 2021-11-29 ENCOUNTER — Ambulatory Visit
Admission: RE | Admit: 2021-11-29 | Discharge: 2021-11-29 | Disposition: A | Discharge status: Home / Self Care | Payer: No Typology Code available for payment source | Source: Ambulatory Visit | Attending: Radiation Oncology | Admitting: Radiation Oncology

## 2021-12-01 ENCOUNTER — Ambulatory Visit (HOSPITAL_COMMUNITY): Admission: RE | Admit: 2021-12-01 | Payer: Self-pay | Source: Ambulatory Visit

## 2021-12-03 ENCOUNTER — Encounter: Payer: Self-pay | Admitting: *Deleted

## 2021-12-03 NOTE — Progress Notes (Incomplete)
[place]  [phone] [phone]) [phone]    ID: Diane Miranda DOB: 1976-03-18  MR#: [id number]  WGN#:[id number]  Patient Care Team: [name], MD as PCP - General (Internal Medicine) Mauro Kaufmann, RN as Oncology Nurse Navigator Rockwell Germany, RN as Oncology Nurse Navigator Stark Klein, MD as Consulting Physician (General Surgery) Magrinat, Virgie Dad, MD as Consulting Physician (Oncology) Gery Pray, MD as Consulting Physician (Radiation Oncology) Wylene Simmer, MD as Consulting Physician (Orthopedic Surgery) Aurea Graff OTHER MD:  CHIEF COMPLAINT: triple positive breast cancer  CURRENT TREATMENT: anti-HER2 treatment   INTERVAL HISTORY: Diane Miranda returns today for follow up and treatment of her triple positive breast cancer.  She continues on anti-HER2 treatment every 21 days .  She is tolerating this well, with no side effects that she is aware of.  Her most recent echo, in October, showed a well-preserved ejection fraction.  Since her last visit, she completed radiation therapy on 11/29/2021.   REVIEW OF SYSTEMS: Diane Miranda    COVID 19 VACCINATION STATUS: refuses vaccination; infection 10/2019   HISTORY OF CURRENT ILLNESS: From the original intake note:  Diane Miranda herself palpated a right breast mass and noted associated pain. She underwent bilateral diagnostic mammography with tomography and right breast ultrasonography at [facility] on 02/15/2021 showing: breast density category B; palpable 5.4 cm mass within outer right breast; two abnormal right axillary lymph nodes; 1.8 cm indeterminate hypoechoic area within upper-outer right breast; no evidence of left breast malignancy.  Accordingly on 02/23/2021 she proceeded to biopsy of the right breast areas in question. The pathology from this procedure ([id number]) showed:  1. Right Breast, 11 o'clock  - adenosis and columnar cell hyperplasia; this was  concordant 2. Right Breast, 9 o'clock  - invasive ductal carcinoma, grade 3  - Prognostic indicators significant for: estrogen receptor, 40% positive with moderate staining intensity and progesterone receptor, 60% positive with strong staining intensity. Proliferation marker Ki67 at 35%. HER2 positive by immunohistochemistry (3+). 3. Right Axilla, lymph node  - invasive ductal carcinoma involving nodal tissue  She underwent breast MRI on 03/04/2021 showing: breast composition B; 4.7 cm mass in 8-9 o'clock position of posterior right breast represents recently diagnosed malignancy; two adjacent abnormal right axillary lymph nodes, one of which represents the biopsy-proven metastatic node; significant skin thickening in periareolar right breast, with significant increased T2 signal consistent with edema; mild abnormal enhancement within thickened skin, nonspecific.  There was also a 1.1 cm nonspecific enhancing mass in left breast at 2-3 o'clock.   Cancer Staging  Malignant neoplasm of upper-outer quadrant of right breast in female, estrogen receptor positive ([place]) Staging form: Breast, AJCC 8th Edition - Clinical: Stage IIB (cT3, cN1, cM0, G3, ER+, PR+, HER2+) - Signed by Chauncey Cruel, MD on 03/07/2021 Histologic grading system: 3 grade system  The patient's subsequent history is as detailed below.   PAST MEDICAL HISTORY: Past Medical History:  Diagnosis Date   Allergy 2000   Asthma    as child   Breast cancer ([place])    Family history of lung cancer 03/08/2021   Family history of uterine cancer 03/08/2021   GERD (gastroesophageal reflux disease)    Headache    History of COVID-19    Hyperlipidemia    Hypertension    Pre-diabetes    Prediabetes    right breast ca dx'd 02/2021   Trimalleolar fracture of ankle, closed, left, initial encounter     PAST SURGICAL HISTORY: Past  Surgical History:  Procedure Laterality Date   BREAST BIOPSY Right 03/22/2021   Procedure: PUNCH BIOPSY  OF RIGHT AREOLA;  Surgeon: Stark Klein, MD;  Location: [facility];  Service: General;  Laterality: Right;   BREAST LUMPECTOMY WITH RADIOACTIVE SEED AND SENTINEL LYMPH NODE BIOPSY Right 08/29/2021   Procedure: RIGHT BREAST LUMPECTOMY WITH RADIOACTIVE SEED AND RIGHT AXILLARY SENTINEL LYMPH NODE BIOPSY;  Surgeon: Stark Klein, MD;  Location: [facility];  Service: General;  Laterality: Right;   FRACTURE SURGERY  2018   NO PAST SURGERIES     ORIF ANKLE FRACTURE Left 07/10/2017   Procedure: OPEN REDUCTION INTERNAL FIXATION (ORIF) trimallolar ANKLE FRACTURE;  Surgeon: Wylene Simmer, MD;  Location: [facility];  Service: Orthopedics;  Laterality: Left;   PORTACATH PLACEMENT Left 03/22/2021   Procedure: INSERTION PORT-A-CATH;  Surgeon: Stark Klein, MD;  Location: [facility];  Service: General;  Laterality: Left;   RADIOACTIVE SEED GUIDED AXILLARY SENTINEL LYMPH NODE Right 08/29/2021   Procedure: RADIOACTIVE SEED GUIDED AXILLARY SENTINEL LYMPH NODE EXCISION;  Surgeon: Stark Klein, MD;  Location: [facility];  Service: General;  Laterality: Right;    FAMILY HISTORY: Family History  Problem Relation Age of Onset   Early death Mother    Heart disease Mother    Hypertension Mother    Diabetes Father    Hypertension Father    Diabetes Brother    Hearing loss Maternal Grandmother    Hypertension Maternal Grandmother    Uterine cancer Other 78       MGM's sister   Throat cancer Other        MGM's brother; dx after 46   Lung cancer Other        PGM's sister; dx 54s   Her father is living at age 82 as of 2021-03-30. Her mother died at age 2 from [place]. Diane Miranda has two brothers (and no sisters). She reports endometrial cancer in a maternal great aunt and lung cancer in a paternal aunt. There is no other family history of cancer to her knowledge.   GYNECOLOGIC HISTORY:  No LMP recorded. Menarche: 45 years old Age at first live birth: 45 years old [place] P 2 LMP early 03/30/2021 (has since had IUD placed) Contraceptive: IUD in place [place]), previously used pills from 1996-2006 HRT n/a  Hysterectomy? no BSO? 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 She is single. She lives at home with daughter Diane Miranda (age 16), adopted daughter Diane Miranda (age 53), and the patient's maternal grandmother, who has significant dementia issues. Daughter Diane Miranda, age 5, works in Press photographer in [place]. Namiah is a Restaurant manager, fast food.    ADVANCED DIRECTIVES: not in place; however the patient made it clear during her 03/07/2021 visit that she would not accept blood product transfusions for any reason including immediately life saving reasons   HEALTH MAINTENANCE: Social History   Tobacco Use   Smoking status: Never   Smokeless tobacco: Never  Vaping Use   Vaping Use: Never used  Substance Use Topics   Alcohol use: Yes    Comment: social   Drug use: No     Colonoscopy: n/a (age)  PAP: 2020  Bone density: n/a (age)   Allergies  Allergen Reactions   Hydrochlorothiazide Anaphylaxis and Swelling    Current Outpatient Medications  Medication Sig Dispense Refill   acetaminophen (TYLENOL) 500 MG tablet Take 1 tablet (500 mg total) by  mouth 3 (three) times daily with meals as needed. Take with aleve 220 mg tablet 90 tablet 0   Fexofenadine HCl (ALLEGRA ALLERGY PO) Take 1 tablet by mouth daily.     gabapentin (NEURONTIN) 300 MG capsule Take 1 capsule (300 mg total) by mouth at bedtime. 90 capsule 4   lidocaine-prilocaine (EMLA) cream Apply 1 application topically as needed [facility]).     loratadine (CLARITIN) 10 MG tablet Take 1 tablet (10 mg total) by mouth daily. 90 tablet 4   losartan (COZAAR) 50 MG tablet Take 1 tablet (50 mg total) by mouth every evening. 30 tablet 3   metoprolol succinate (TOPROL XL) 100 MG 24 hr tablet Take 1 tablet (100 mg total) by mouth every morning. 30 tablet 0    Multiple Vitamins-Minerals (MULTIVITAMIN WITH MINERALS) tablet Take 1 tablet by mouth daily.     rosuvastatin (CRESTOR) 10 MG tablet Take 10 mg by mouth daily.     No current facility-administered medications for this visit.    OBJECTIVE: African-American woman who appears stated age  There were no vitals filed for this visit.     There is no height or weight on file to calculate BMI.   Wt Readings from Last 3 Encounters:  11/22/21 227 lb (103 kg)  10/23/21 229 lb 8 oz (104.1 kg)  10/02/21 226 lb (102.5 kg)     ECOG FS:1 - Symptomatic but completely ambulatory  Sclerae unicteric, EOMs intact Wearing a mask No cervical or supraclavicular adenopathy Lungs no rales or rhonchi Heart regular rate and rhythm Abd soft, nontender, positive bowel sounds MSK no focal spinal tenderness, no upper extremity lymphedema Neuro: nonfocal, well oriented, appropriate affect Breasts:    {Sclerae unicteric, EOMs intact Wearing a mask No cervical or supraclavicular adenopathy Lungs no rales or rhonchi Heart regular rate and rhythm Abd soft, nontender, positive bowel sounds MSK no focal spinal tenderness, no upper extremity lymphedema Neuro: nonfocal, well oriented, appropriate affect Breasts: The right breast is status postlumpectomy and is currently receiving radiation.  The cosmetic result is excellent.  There is no significant hyperpigmentation and certainly no desquamation to date.  Left breast and both axillae are benign}   LAB RESULTS:  CMP     Component Value Date/Time   NA 140 11/13/2021 0953   K 3.4 (L) 11/13/2021 0953   CL 108 11/13/2021 0953   CO2 27 11/13/2021 0953   GLUCOSE 119 (H) 11/13/2021 0953   GLUCOSE 105 (H) 11/12/2006 1022   BUN 8 11/13/2021 0953   CREATININE 0.88 11/13/2021 0953   CALCIUM 8.3 (L) 11/13/2021 0953   PROT 6.3 (L) 11/13/2021 0953   ALBUMIN 3.5 11/13/2021 0953   AST 13 (L) 11/13/2021 0953   ALT 12 11/13/2021 0953   ALKPHOS 53 11/13/2021 0953    BILITOT 0.4 11/13/2021 0953   GFRNONAA >60 11/13/2021 0953   GFRAA >90 01/30/2015 1319    No results found for: TOTALPROTELP, ALBUMINELP, A1GS, A2GS, BETS, BETA2SER, GAMS, MSPIKE, SPEI  Lab Results  Component Value Date   WBC 5.9 11/13/2021   NEUTROABS 4.2 11/13/2021   HGB 11.1 (L) 11/13/2021   HCT 32.6 (L) 11/13/2021   MCV 92.6 11/13/2021   PLT 198 11/13/2021    No results found for: LABCA2  No components found for: NOTRRN165  No results for input(s): INR in the last 168 hours.  No results found for: LABCA2  No results found for: BXU383  No results found for: FXO329  No results found for: VBT660  No results found for: CA2729  No components found for: HGQUANT  No results found for: CEA1 / No results found for: CEA1   No results found for: AFPTUMOR  No results found for: CHROMOGRNA  No results found for: KPAFRELGTCHN, LAMBDASER, KAPLAMBRATIO (kappa/lambda light chains)  No results found for: HGBA, HGBA2QUANT, HGBFQUANT, HGBSQUAN (Hemoglobinopathy evaluation)   No results found for: LDH  No results found for: IRON, TIBC, IRONPCTSAT (Iron and TIBC)  No results found for: FERRITIN  Urinalysis    Component Value Date/Time   COLORURINE YELLOW 01/30/2015 [place] 01/30/2015 1343   LABSPEC 1.011 01/30/2015 1343   PHURINE 7.5 01/30/2015 [place] 01/30/2015 [place] 01/30/2015 1343   BILIRUBINUR NEGATIVE 01/30/2015 1343   KETONESUR NEGATIVE 01/30/2015 1343   PROTEINUR NEGATIVE 01/30/2015 1343   UROBILINOGEN 0.2 01/30/2015 1343   NITRITE NEGATIVE 01/30/2015 1343   LEUKOCYTESUR SMALL (A) 01/30/2015 1343    STUDIES: No results found.   ELIGIBLE FOR AVAILABLE RESEARCH PROTOCOL:no  ASSESSMENT: 45 y.o. [place] woman status post right breast upper outer quadrant biopsy 02/23/2021 for a clinical T3 N1, stage IIB invasive ductal carcinoma, grade 3, triple positive, with an MIB-1 of 35%  (a) right periareolar  biopsy 02/23/2021 was benign/concordant  (b) left breast nodule biopsy 03/14/2021 benign  (c) Punch biopsy of the right areolar area 03/22/2021 no malignancy  (d) CT of the chest abdomen and pelvis 03/20/2021 shows no evidence of metastatic disease; there is 2.5 cm cystic pancreatic mass and a 0.4 cm right liver lobe lesion requiring further follow-up  (e) bone scan 03/25/2021 shows no bone metastases  (1) genetics testing 03/26/2021 through the [employer]RNA[employer] panel offered by [facility] found no deleterious mutations in APC, ATM, AXIN2, BARD1, BMPR1A, BRCA1, BRCA2, BRIP1, CDH1, CDK4, CDKN2A, CHEK2, DICER1, EPCAM, GREM1, HOXB13, MEN1, MLH1, MSH2, MSH3, MSH6, MUTYH, NBN, NF1, NF2, NTHL1, PALB2, PMS2, POLD1, POLE, PTEN, RAD51C, RAD51D, RECQL, RET, SDHA, SDHAF2, SDHB, SDHC, SDHD, SMAD4, SMARCA4, STK11, TP53, TSC1, TSC2, and VHL.  RNA data is routinely analyzed for use in variant interpretation for all genes.  (2) neoadjuvant chemotherapy consisting of carboplatin, docetaxel, trastuzumab and pertuzumab every 21 days x 6 beginning 03/28/2021, completed 07/10/2021  (3) trastuzumab and pertuzumab to continue to total 1 year  (a) echo 03/13/2021 showed an ejection fraction in the 60- 65% range  (b) echo 06/27/2021 showed an ejection fraction in the 65-70%  (c) echo 09/25/2021 shows an ejection fraction in the 60-65% range.  (4) right lumpectomy and sentinel lymph node sampling 08/29/2021 showed no residual invasive disease in the breast or lymph nodes, rare foci of residual DCIS the largest measuring 0.1 cm, with negative margins:ypT(is) ypN0  (a) 5 right  axillary lymph nodes were removed  (5) adjuvant radiation to be completed 11/27/2021 (6) antiestrogens to follow   PLAN: Demeka is now 2 months postsurgery.  She is receiving radiation.  She is tolerating treatment quite well.  She understands that the fatigue she feels there is normal and that the best way to combat it is indeed  to be as active as she can manage.  It helps that her hemoglobin is approaching normalcy.  The plan is to continue trastuzumab to complete a year which will take [place] to April of next year.  In the original staging studies there was a pancreatic cyst and a very small liver lesion.  We are going to obtain a repeat MRI of the liver mid December to further  evaluate those  She will see me December 04, 2021 to discuss those results and we will set her up for further follow-up.  Total encounter time 25 minutes.Sarajane Jews C. Magrinat, MD 12/03/2021 8:20 AM Medical Oncology and Hematology [facility] [place], [place] [address] Tel. [phone]    Fax. [phone]   This document serves as a record of services personally performed by Lurline Del, MD. It was created on his behalf by Wilburn Mylar, a trained medical scribe. The creation of this record is based on the scribe's personal observations and the provider's statements to them.   I, Lurline Del MD, have reviewed the above documentation for accuracy and completeness, and I agree with the above.   *Total Encounter Time as defined by the Centers for Medicare and Medicaid Services includes, in addition to the face-to-face time of a patient visit (documented in the note above) non-face-to-face time: obtaining and reviewing outside history, ordering and reviewing medications, tests or procedures, care coordination (communications with other health care professionals or caregivers) and documentation in the medical record.

## 2021-12-04 ENCOUNTER — Inpatient Hospital Stay: Payer: Managed Care, Other (non HMO)

## 2021-12-04 ENCOUNTER — Inpatient Hospital Stay: Payer: Managed Care, Other (non HMO) | Admitting: Oncology

## 2021-12-04 ENCOUNTER — Other Ambulatory Visit: Payer: Self-pay | Admitting: Oncology

## 2021-12-04 ENCOUNTER — Telehealth: Payer: Self-pay

## 2021-12-04 NOTE — Telephone Encounter (Signed)
Called pt, left VM, need to reschedule pt appointments and treatments

## 2021-12-06 ENCOUNTER — Encounter: Payer: No Typology Code available for payment source | Admitting: Physical Therapy

## 2021-12-07 ENCOUNTER — Other Ambulatory Visit: Payer: Self-pay | Admitting: Cardiology

## 2021-12-07 DIAGNOSIS — I10 Essential (primary) hypertension: Secondary | ICD-10-CM

## 2021-12-11 ENCOUNTER — Ambulatory Visit: Payer: Self-pay

## 2021-12-12 ENCOUNTER — Encounter: Payer: Self-pay | Admitting: *Deleted

## 2021-12-14 ENCOUNTER — Telehealth: Payer: Self-pay | Admitting: Adult Health

## 2021-12-14 NOTE — Telephone Encounter (Signed)
Scheduled per sch msg. Called and left msg  

## 2021-12-17 ENCOUNTER — Encounter: Payer: Self-pay | Admitting: Oncology

## 2021-12-17 NOTE — Progress Notes (Signed)
[place]  [phone] [phone]    ID: Diane Miranda DOB: 03-01-1976  MR#: [id number]  ZYS#:[id number]  Patient Care Team: [name], MD as PCP - General (Internal Medicine) Mauro Kaufmann, RN as Oncology Nurse Navigator Rockwell Germany, RN as Oncology Nurse Navigator Stark Klein, MD as Consulting Physician (General Surgery) Gery Pray, MD as Consulting Physician (Radiation Oncology) Wylene Simmer, MD as Consulting Physician (Orthopedic Surgery) Benay Pike, MD as Consulting Physician (Hematology and Oncology) Scot Dock, NP OTHER MD:  CHIEF COMPLAINT: triple positive breast cancer  CURRENT TREATMENT: anti-HER2 treatment; adjuvant radiation   INTERVAL HISTORY: Calena returns today for follow up and treatment of her triple positive breast cancer.  She continues on anti-HER2 treatment every 21 days .  She continues to tolerate this well.  Her most recent echocardiogram was normal and was completed in October 2022.  She recently completed radiation on November 27, 2021.  She noted that she had some skin peeling but otherwise tolerated it well.  She does note slight pain in her right lower chest wall.  She wants to know if she can resume limiting her calories for weight loss.   REVIEW OF SYSTEMS: Review of Systems  Constitutional:  Negative for appetite change, chills, fatigue, fever and unexpected weight change.  HENT:   Negative for hearing loss, lump/mass and trouble swallowing.   Eyes:  Negative for eye problems and icterus.  Respiratory:  Negative for chest tightness, cough and shortness of breath.   Cardiovascular:  Negative for chest pain, leg swelling and palpitations.  Gastrointestinal:  Negative for abdominal distention, abdominal pain, constipation, diarrhea, nausea and vomiting.  Endocrine: Negative for hot flashes.  Genitourinary:  Negative for difficulty urinating.   Musculoskeletal:  Negative  for arthralgias.  Skin:  Negative for itching and rash.  Neurological:  Negative for dizziness, extremity weakness, headaches and numbness.  Hematological:  Negative for adenopathy. Does not bruise/bleed easily.  Psychiatric/Behavioral:  Negative for depression. The patient is not nervous/anxious.      COVID 19 VACCINATION STATUS: refuses vaccination; infection 10/2019   HISTORY OF CURRENT ILLNESS: From the original intake note:  Diane Miranda herself palpated a right breast mass and noted associated pain. She underwent bilateral diagnostic mammography with tomography and right breast ultrasonography at [facility] on 02/15/2021 showing: breast density category B; palpable 5.4 cm mass within outer right breast; two abnormal right axillary lymph nodes; 1.8 cm indeterminate hypoechoic area within upper-outer right breast; no evidence of left breast malignancy.  Accordingly on 02/23/2021 she proceeded to biopsy of the right breast areas in question. The pathology from this procedure ([id number]) showed:  1. Right Breast, 11 o'clock  - adenosis and columnar cell hyperplasia; this was concordant 2. Right Breast, 9 o'clock  - invasive ductal carcinoma, grade 3  - Prognostic indicators significant for: estrogen receptor, 45% positive with moderate staining intensity and progesterone receptor, 60% positive with strong staining intensity. Proliferation marker Ki67 at 45% at 35%. HER2 positive by immunohistochemistry (3+). 3. Right Axilla, lymph node  - invasive ductal carcinoma involving nodal tissue  She underwent breast MRI on 03/04/2021 showing: breast composition B; 4.7 cm mass in 8-9 o'clock position of posterior right breast represents recently diagnosed malignancy; two adjacent abnormal right axillary lymph nodes, one of which represents the biopsy-proven metastatic node; significant skin thickening in periareolar right breast, with significant increased T2 signal consistent with edema; mild  abnormal enhancement within thickened skin, nonspecific.  There was also  a 1.1 cm nonspecific enhancing mass in left breast at 2-3 o'clock.   Cancer Staging  Malignant neoplasm of upper-outer quadrant of right breast in female, estrogen receptor positive ([place]) Staging form: Breast, AJCC 8th Edition - Clinical: Stage IIB (cT3, cN1, cM0, G3, ER+, PR+, HER2+) - Signed by Chauncey Cruel, MD on 03/07/2021 Histologic grading system: 3 grade system   The patient's subsequent history is as detailed below.   PAST MEDICAL HISTORY: Past Medical History:  Diagnosis Date   Allergy 2000   Asthma    as child   Breast cancer ([place])    Family history of lung cancer 03/08/2021   Family history of uterine cancer 03/08/2021   GERD (gastroesophageal reflux disease)    Headache    History of COVID-19    Hyperlipidemia    Hypertension    Pre-diabetes    Prediabetes    right breast ca dx'd 03/06/2021   Trimalleolar fracture of ankle, closed, left, initial encounter     PAST SURGICAL HISTORY: Past Surgical History:  Procedure Laterality Date   BREAST BIOPSY Right 03/22/2021   Procedure: PUNCH BIOPSY OF RIGHT AREOLA;  Surgeon: Stark Klein, MD;  Location: [facility];  Service: General;  Laterality: Right;   BREAST LUMPECTOMY WITH RADIOACTIVE SEED AND SENTINEL LYMPH NODE BIOPSY Right 08/29/2021   Procedure: RIGHT BREAST LUMPECTOMY WITH RADIOACTIVE SEED AND RIGHT AXILLARY SENTINEL LYMPH NODE BIOPSY;  Surgeon: Stark Klein, MD;  Location: [facility];  Service: General;  Laterality: Right;   FRACTURE SURGERY  2018   NO PAST SURGERIES     ORIF ANKLE FRACTURE Left 07/10/2017   Procedure: OPEN REDUCTION INTERNAL FIXATION (ORIF) trimallolar ANKLE FRACTURE;  Surgeon: Wylene Simmer, MD;  Location: [facility];  Service: Orthopedics;  Laterality: Left;   PORTACATH PLACEMENT Left 03/22/2021   Procedure: INSERTION PORT-A-CATH;  Surgeon: Stark Klein, MD;  Location: [facility];   Service: General;  Laterality: Left;   RADIOACTIVE SEED GUIDED AXILLARY SENTINEL LYMPH NODE Right 08/29/2021   Procedure: RADIOACTIVE SEED GUIDED AXILLARY SENTINEL LYMPH NODE EXCISION;  Surgeon: Stark Klein, MD;  Location: [facility];  Service: General;  Laterality: Right;    FAMILY HISTORY: Family History  Problem Relation Age of Onset   Early death Mother    Heart disease Mother    Hypertension Mother    Diabetes Father    Hypertension Father    Diabetes Brother    Hearing loss Maternal Grandmother    Hypertension Maternal Grandmother    Uterine cancer Other 56       MGM's sister   Throat cancer Other        MGM's brother; dx after 24   Lung cancer Other        PGM's sister; dx 20s   Her father is living at age 53 as of 03/06/2021. Her mother died at age 18 from [place]. Jacqueli has two brothers (and no sisters). She reports endometrial cancer in a maternal great aunt and lung cancer in a paternal aunt. There is no other family history of cancer to her knowledge.   GYNECOLOGIC HISTORY:  No LMP recorded. Menarche: 46 years old Age at first live birth: 46 years old [place] P 2 LMP early 2021/03/06 (has since had IUD placed) Contraceptive: IUD in place [place]), previously used pills from 1996-2006 HRT n/a  Hysterectomy? no BSO? 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 She is  single. She lives at home with daughter Joylene Draft (age 62), adopted daughter Hetty Blend (age 7), and the patient's maternal grandmother, who has significant dementia issues. Daughter Levy Pupa, age 65, works in Press photographer in [place]. Jiselle is a Restaurant manager, fast food.    ADVANCED DIRECTIVES: not in place; however the patient made it clear during her 03/07/2021 visit that she would not accept blood product transfusions for any reason including immediately life saving reasons   HEALTH MAINTENANCE: Social History   Tobacco Use   Smoking status:  Never   Smokeless tobacco: Never  Vaping Use   Vaping Use: Never used  Substance Use Topics   Alcohol use: Yes    Comment: social   Drug use: No     Colonoscopy: n/a (age)  PAP: 2020  Bone density: n/a (age)   Allergies  Allergen Reactions   Hydrochlorothiazide Anaphylaxis and Swelling    Current Outpatient Medications  Medication Sig Dispense Refill   acetaminophen (TYLENOL) 500 MG tablet Take 1 tablet (500 mg total) by mouth 3 (three) times daily with meals as needed. Take with aleve 220 mg tablet 90 tablet 0   Fexofenadine HCl (ALLEGRA ALLERGY PO) Take 1 tablet by mouth daily.     gabapentin (NEURONTIN) 300 MG capsule Take 1 capsule (300 mg total) by mouth at bedtime. 90 capsule 4   lidocaine-prilocaine (EMLA) cream Apply 1 application topically as needed [facility]).     loratadine (CLARITIN) 10 MG tablet Take 1 tablet (10 mg total) by mouth daily. 90 tablet 4   losartan (COZAAR) 50 MG tablet Take 1 tablet (50 mg total) by mouth every evening. 30 tablet 3   metoprolol succinate (TOPROL-XL) 100 MG 24 hr tablet TAKE 1 TABLET BY MOUTH EVERY DAY IN THE MORNING 90 tablet 1   Multiple Vitamins-Minerals (MULTIVITAMIN WITH MINERALS) tablet Take 1 tablet by mouth daily.     rosuvastatin (CRESTOR) 10 MG tablet Take 10 mg by mouth daily.     No current facility-administered medications for this visit.    OBJECTIVE: African-American woman who appears stated age  24:   12/18/21 1249  BP: 135/75  Pulse: 86  Resp: 18  Temp: (!) 97 F (36.1 C)  SpO2: 100%        Body mass index is 38.96 kg/m.   Wt Readings from Last 3 Encounters:  12/18/21 227 lb (103 kg)  11/22/21 227 lb (103 kg)  10/23/21 229 lb 8 oz (104.1 kg)     ECOG FS:1 - Symptomatic but completely ambulatory GENERAL: Patient is a well appearing female in no acute distress HEENT:  Sclerae anicteric.  Oropharynx clear and moist. No ulcerations or evidence of oropharyngeal candidiasis. Neck is supple.  NODES:  No  cervical, supraclavicular, or axillary lymphadenopathy palpated.  BREAST EXAM:  Deferred. LUNGS:  Clear to auscultation bilaterally.  No wheezes or rhonchi. HEART:  Regular rate and rhythm. No murmur appreciated. ABDOMEN:  Soft, nontender.  Positive, normoactive bowel sounds. No organomegaly palpated. MSK:  No focal spinal tenderness to palpation. Full range of motion bilaterally in the upper extremities. EXTREMITIES:  No peripheral edema.   SKIN:  Clear with no obvious rashes or skin changes. No nail dyscrasia. NEURO:  Nonfocal. Well oriented.  Appropriate affect.    LAB RESULTS:  CMP     Component Value Date/Time   NA 140 11/13/2021 0953   K 3.4 (L) 11/13/2021 0953   CL 108 11/13/2021 0953   CO2 27 11/13/2021 0953   GLUCOSE 119 (H) 11/13/2021  0017   GLUCOSE 105 (H) 11/12/2006 1022   BUN 8 11/13/2021 0953   CREATININE 0.88 11/13/2021 0953   CALCIUM 8.3 (L) 11/13/2021 0953   PROT 6.3 (L) 11/13/2021 0953   ALBUMIN 3.5 11/13/2021 0953   AST 13 (L) 11/13/2021 0953   ALT 12 11/13/2021 0953   ALKPHOS 53 11/13/2021 0953   BILITOT 0.4 11/13/2021 0953   GFRNONAA >60 11/13/2021 0953   GFRAA >90 01/30/2015 1319    No results found for: TOTALPROTELP, ALBUMINELP, A1GS, A2GS, BETS, BETA2SER, GAMS, MSPIKE, SPEI  Lab Results  Component Value Date   WBC 6.0 12/18/2021   NEUTROABS 4.1 12/18/2021   HGB 11.1 (L) 12/18/2021   HCT 32.9 (L) 12/18/2021   MCV 92.7 12/18/2021   PLT 263 12/18/2021    No results found for: LABCA2  No components found for: [id number]  No results for input(s): INR in the last 168 hours.  No results found for: LABCA2  No results found for: FMB846  No results found for: KZL935  No results found for: TSV779  No results found for: CA2729  No components found for: HGQUANT  No results found for: CEA1 / No results found for: CEA1   No results found for: AFPTUMOR  No results found for: CHROMOGRNA  No results found for: KPAFRELGTCHN, LAMBDASER,  KAPLAMBRATIO (kappa/lambda light chains)  No results found for: HGBA, HGBA2QUANT, HGBFQUANT, HGBSQUAN (Hemoglobinopathy evaluation)   No results found for: LDH  No results found for: IRON, TIBC, IRONPCTSAT (Iron and TIBC)  No results found for: FERRITIN  Urinalysis    Component Value Date/Time   COLORURINE YELLOW 01/30/2015 [place] 01/30/2015 1343   LABSPEC 1.011 01/30/2015 1343   PHURINE 7.5 01/30/2015 [place] 01/30/2015 [place] 01/30/2015 [place] 01/30/2015 1343   KETONESUR NEGATIVE 01/30/2015 1343   PROTEINUR NEGATIVE 01/30/2015 1343   UROBILINOGEN 0.2 01/30/2015 1343   NITRITE NEGATIVE 01/30/2015 1343   LEUKOCYTESUR SMALL (A) 01/30/2015 1343    STUDIES: No results found.   ELIGIBLE FOR AVAILABLE RESEARCH PROTOCOL:no  ASSESSMENT: 46 y.o. [place] woman status post right breast upper outer quadrant biopsy 02/23/2021 for a clinical T3 N1, stage IIB invasive ductal carcinoma, grade 3, triple positive, with an MIB-1 of 35%  (a) right periareolar biopsy 02/23/2021 was benign/concordant  (b) left breast nodule biopsy 03/14/2021 benign  (c) Punch biopsy of the right areolar area 03/22/2021 no malignancy  (d) CT of the chest abdomen and pelvis 03/20/2021 shows no evidence of metastatic disease; there is 2.5 cm cystic pancreatic mass and a 0.4 cm right liver lobe lesion requiring further follow-up  (e) bone scan 03/25/2021 shows no bone metastases  (1) genetics testing 03/26/2021 through the [employer]Cancer[facility]RNA[facility]ight panel offered by [facility] found no deleterious mutations in APC, ATM, AXIN2, BARD1, BMPR1A, BRCA1, BRCA2, BRIP1, CDH1, CDK4, CDKN2A, CHEK2, DICER1, EPCAM, GREM1, HOXB13, MEN1, MLH1, MSH2, MSH3, MSH6, MUTYH, NBN, NF1, NF2, NTHL1, PALB2, PMS2, POLD1, POLE, PTEN, RAD51C, RAD51D, RECQL, RET, SDHA, SDHAF2, SDHB, SDHC, SDHD, SMAD4, SMARCA4, STK11, TP53, TSC1, TSC2, and VHL.  RNA data is  routinely analyzed for use in variant interpretation for all genes.  (2) neoadjuvant chemotherapy consisting of carboplatin, docetaxel, trastuzumab and pertuzumab every 21 days x 6 beginning 03/28/2021, completed 07/10/2021  (3) trastuzumab and pertuzumab to continue to total 1 year  (a) echo 03/13/2021 showed an ejection fraction in the 60- 65% range  (b) echo 06/27/2021 showed an ejection fraction in the  65-70%  (c) echo 09/25/2021 shows an ejection fraction in the 60-65% range.  (4) right lumpectomy and sentinel lymph node sampling 08/29/2021 showed no residual invasive disease in the breast or lymph nodes, rare foci of residual DCIS the largest measuring 0.1 cm, with negative margins:ypT(is) ypN0  (a) 5 right  axillary lymph nodes were removed  (5) adjuvant radiation to be completed 11/27/2021  (6) antiestrogens to follow   PLAN: Daijha is here today for follow-up of her triple positive breast cancer prior to treatment with Herceptin [place].  She will continue on treatment.  She is tolerating this well.  I will place orders for her echocardiogram that are due this month.  We also discussed tamoxifen daily.  Her last menstrual cycle was in May 2022 therefore she is not considered postmenopausal at this point.  She has decided that she may not take the tamoxifen and will continue to consider this.  She was due for her abdominal MRI, however since her insurance changed she had to cancel this and plans on rescheduling that in the next week or so.  I reviewed with Princess that she can resume healthy diet and exercise.  She plans to do this.  She will return in 3 weeks for labs, follow-up with Dr. Chryl Heck, and her next treatment.   Total encounter time 20 minutes.*In face-to-face visit time, chart review, lab review, care coordination, and documentation of the encounter.  Wilber Bihari, NP 12/18/21 1:17 PM Medical Oncology and Hematology [facility] [place], [place] [address] Tel. [phone]    Fax. [phone]    *Total Encounter Time as defined by the Centers for Medicare and Medicaid Services includes, in addition to the face-to-face time of a patient visit (documented in the note above) non-face-to-face time: obtaining and reviewing outside history, ordering and reviewing medications, tests or procedures, care coordination (communications with other health care professionals or caregivers) and documentation in the medical record.

## 2021-12-18 ENCOUNTER — Inpatient Hospital Stay (HOSPITAL_BASED_OUTPATIENT_CLINIC_OR_DEPARTMENT_OTHER): Payer: Commercial Managed Care - HMO | Admitting: Adult Health

## 2021-12-18 ENCOUNTER — Other Ambulatory Visit: Payer: Self-pay

## 2021-12-18 ENCOUNTER — Inpatient Hospital Stay: Payer: Commercial Managed Care - HMO

## 2021-12-18 ENCOUNTER — Encounter: Payer: Self-pay | Admitting: Adult Health

## 2021-12-18 ENCOUNTER — Encounter: Payer: Self-pay | Admitting: Oncology

## 2021-12-18 VITALS — BP 135/75 | HR 86 | Temp 97.0°F | Resp 18 | Wt 227.0 lb

## 2021-12-18 DIAGNOSIS — Z923 Personal history of irradiation: Secondary | ICD-10-CM | POA: Insufficient documentation

## 2021-12-18 DIAGNOSIS — R5383 Other fatigue: Secondary | ICD-10-CM | POA: Diagnosis not present

## 2021-12-18 DIAGNOSIS — Z5111 Encounter for antineoplastic chemotherapy: Secondary | ICD-10-CM | POA: Insufficient documentation

## 2021-12-18 DIAGNOSIS — I10 Essential (primary) hypertension: Secondary | ICD-10-CM

## 2021-12-18 DIAGNOSIS — Z79899 Other long term (current) drug therapy: Secondary | ICD-10-CM | POA: Insufficient documentation

## 2021-12-18 DIAGNOSIS — Z170 Estrogen receptor positive status [ER+]: Secondary | ICD-10-CM | POA: Insufficient documentation

## 2021-12-18 DIAGNOSIS — C50411 Malignant neoplasm of upper-outer quadrant of right female breast: Secondary | ICD-10-CM | POA: Insufficient documentation

## 2021-12-18 DIAGNOSIS — Z95828 Presence of other vascular implants and grafts: Secondary | ICD-10-CM

## 2021-12-18 LAB — CBC WITH DIFFERENTIAL/PLATELET
Abs Immature Granulocytes: 0.02 10*3/uL (ref 0.00–0.07)
Basophils Absolute: 0 10*3/uL (ref 0.0–0.1)
Basophils Relative: 1 %
Eosinophils Absolute: 0.2 10*3/uL (ref 0.0–0.5)
Eosinophils Relative: 3 %
HCT: 32.9 % — ABNORMAL LOW (ref 36.0–46.0)
Hemoglobin: 11.1 g/dL — ABNORMAL LOW (ref 12.0–15.0)
Immature Granulocytes: 0 %
Lymphocytes Relative: 19 %
Lymphs Abs: 1.2 10*3/uL (ref 0.7–4.0)
MCH: 31.3 pg (ref 26.0–34.0)
MCHC: 33.7 g/dL (ref 30.0–36.0)
MCV: 92.7 fL (ref 80.0–100.0)
Monocytes Absolute: 0.5 10*3/uL (ref 0.1–1.0)
Monocytes Relative: 8 %
Neutro Abs: 4.1 10*3/uL (ref 1.7–7.7)
Neutrophils Relative %: 69 %
Platelets: 263 10*3/uL (ref 150–400)
RBC: 3.55 MIL/uL — ABNORMAL LOW (ref 3.87–5.11)
RDW: 14.9 % (ref 11.5–15.5)
WBC: 6 10*3/uL (ref 4.0–10.5)
nRBC: 0 % (ref 0.0–0.2)

## 2021-12-18 LAB — COMPREHENSIVE METABOLIC PANEL
ALT: 11 U/L (ref 0–44)
AST: 9 U/L — ABNORMAL LOW (ref 15–41)
Albumin: 3.6 g/dL (ref 3.5–5.0)
Alkaline Phosphatase: 55 U/L (ref 38–126)
Anion gap: 5 (ref 5–15)
BUN: 9 mg/dL (ref 6–20)
CO2: 28 mmol/L (ref 22–32)
Calcium: 8.6 mg/dL — ABNORMAL LOW (ref 8.9–10.3)
Chloride: 109 mmol/L (ref 98–111)
Creatinine, Ser: 0.91 mg/dL (ref 0.44–1.00)
GFR, Estimated: >60 mL/min (ref >60)
Glucose, Bld: 98 mg/dL (ref 70–99)
Potassium: 3.7 mmol/L (ref 3.5–5.1)
Sodium: 142 mmol/L (ref 135–145)
Total Bilirubin: 0.5 mg/dL (ref 0.3–1.2)
Total Protein: 6.3 g/dL — ABNORMAL LOW (ref 6.5–8.1)

## 2021-12-18 MED ORDER — SODIUM CHLORIDE 0.9% FLUSH
10.0000 mL | INTRAVENOUS | Status: DC | PRN
  Administered 2021-12-18: 10 mL

## 2021-12-18 MED ORDER — HEPARIN SOD (PORK) LOCK FLUSH 100 UNIT/ML IV SOLN
500.0000 [IU] | Freq: Once | INTRAVENOUS | Status: AC | PRN
  Administered 2021-12-18: 500 [IU]

## 2021-12-18 MED ORDER — ACETAMINOPHEN 325 MG PO TABS
650.0000 mg | ORAL_TABLET | Freq: Once | ORAL | Status: AC
  Administered 2021-12-18: 650 mg via ORAL
  Filled 2021-12-18: qty 2

## 2021-12-18 MED ORDER — TRASTUZUMAB-ANNS CHEMO 150 MG IV SOLR
6.0000 mg/kg | Freq: Once | INTRAVENOUS | Status: AC
  Administered 2021-12-18: 609 mg via INTRAVENOUS
  Filled 2021-12-18: qty 29

## 2021-12-18 MED ORDER — SODIUM CHLORIDE 0.9 % IV SOLN
420.0000 mg | Freq: Once | INTRAVENOUS | Status: AC
  Administered 2021-12-18: 420 mg via INTRAVENOUS
  Filled 2021-12-18: qty 14

## 2021-12-18 MED ORDER — SODIUM CHLORIDE 0.9% FLUSH
10.0000 mL | Freq: Once | INTRAVENOUS | Status: AC
  Administered 2021-12-18 (×2): 10 mL

## 2021-12-18 MED ORDER — DIPHENHYDRAMINE HCL 25 MG PO CAPS
25.0000 mg | ORAL_CAPSULE | Freq: Once | ORAL | Status: AC
  Administered 2021-12-18: 25 mg via ORAL
  Filled 2021-12-18: qty 1

## 2021-12-18 MED ORDER — SODIUM CHLORIDE 0.9 % IV SOLN: Freq: Once | INTRAVENOUS | Status: AC

## 2021-12-18 NOTE — Patient Instructions (Signed)
Tamoxifen Tablets What is this medication? TAMOXIFEN (ta MOX i fen) prevents and treats breast cancer. It works by blocking the hormone estrogen in breast tissue, which prevents breast cancer cells from spreading or growing. This medicine may be used for other purposes; ask your health care provider or pharmacist if you have questions. COMMON BRAND NAME(S): Nolvadex What should I tell my care team before I take this medication? They need to know if you have any of these conditions: Blood clots Endometriosis High cholesterol Irregular menstrual cycles Liver disease Stroke Uterine cancer Uterine fibroids An unusual reaction to tamoxifen, other medications, foods, dyes, or preservatives Pregnant or trying to get pregnant Breast-feeding How should I use this medication? Take this medication by mouth. Take it as directed on the prescription label at the same time every day. You can take it with or without food. If it upsets your stomach, take it with food. Keep taking it unless your care team tells you to stop. A special MedGuide will be given to you by the pharmacist with each prescription and refill. Be sure to read this information carefully each time. Talk to your care team about the use of this medication in children. Special care may be needed. Overdosage: If you think you have taken too much of this medicine contact a poison control center or emergency room at once. NOTE: This medicine is only for you. Do not share this medicine with others. What if I miss a dose? If you miss a dose, take it as soon as you can. If it is almost time for your next dose, take only that dose. Do not take double or extra doses. What may interact with this medication? Do not take this medication with any of the following: Cisapride Dronedarone Pimozide Thioridazine This medication may also interact with the following: Anastrozole Certain medications for seizures like carbamazepine, phenobarbital,  phenytoin Letrozole Other medications that prolong the QT interval Paroxetine Rifampin Warfarin This list may not describe all possible interactions. Give your health care provider a list of all the medicines, herbs, non-prescription drugs, or dietary supplements you use. Also tell them if you smoke, drink alcohol, or use illegal drugs. Some items may interact with your medicine. What should I watch for while using this medication? Visit your care team for regular checks on your progress. You will need regular pelvic exams, breast exams, and mammograms. Talk to your care team about your risk of uterine cancer. You may be more at risk for uterine cancer if you take this medication. Do not become pregnant while taking this medication or for 2 months after stopping it. Women should inform their care team if they wish to become pregnant or think they might be pregnant. There is a potential for serious harm to an unborn child. Talk to your care team for more information. Do not breast-feed an infant while taking this medication or for 3 months after stopping it. This medication may make it more difficult to get pregnant. Talk with your care team if you are concerned about your fertility. What side effects may I notice from receiving this medication? Side effects that you should report to your care team as soon as possible: Allergic reactions--skin rash, itching, hives, swelling of the face, lips, tongue, or throat Blood clot--pain, swelling, or warmth in the leg, shortness of breath, chest pain High calcium level--increased thirst or amount of urine, nausea, vomiting, confusion, unusual weakness or fatigue, bone pain Infection--fever, chills, cough, or sore throat Irregular menstrual cycles or  spotting Liver injury--right upper belly pain, loss of appetite, nausea, light-colored stool, dark yellow or brown urine, yellowing skin or eyes, unusual weakness or fatigue Low red blood cell count--unusual  weakness or fatigue, dizziness, headache, trouble breathing Stroke--sudden numbness or weakness in the face, arm, or leg, trouble speaking, confusion, trouble walking, loss of balance or coordination, dizziness, severe headache, change in vision Unusual bruising or bleeding Vaginal bleeding after menopause, pelvic pain Side effects that usually do not require medical attention (report to your care team if they continue or are bothersome): Hot flashes Mood swings Vaginal discharge This list may not describe all possible side effects. Call your doctor for medical advice about side effects. You may report side effects to FDA at 1-800-FDA-1088. Where should I keep my medication? Keep out of the reach of children and pets. Store at room temperature between 20 and 25 degrees C (68 and 77 degrees F). Protect from light. Get rid of any unused medication after the expiration date. To get rid of medications that are no longer needed or have expired: Take the medication to a medication take-back program. Check with your pharmacy or law enforcement to find a location. If you cannot return the medication, check the label or package insert to see if the medication should be thrown out in the garbage or flushed down the toilet. If you are not sure, ask your care team. If it is safe to put it in the trash, empty the medication out of the container. Mix the medication with cat litter, dirt, coffee grounds, or other unwanted substance. Seal the mixture in a bag or container. Put it in the trash. NOTE: This sheet is a summary. It may not cover all possible information. If you have questions about this medicine, talk to your doctor, pharmacist, or health care provider.  2022 Elsevier/Gold Standard (2021-08-21 00:00:00)

## 2021-12-18 NOTE — Patient Instructions (Signed)
[facility] ONCOLOGY  Discharge Instructions: Thank you for choosing [facility] to provide your oncology and hematology care.   If you have a lab appointment with the [facility], please go directly to the [facility] and check in at the registration area.   Wear comfortable clothing and clothing appropriate for easy access to any Portacath or PICC line.   We strive to give you quality time with your provider. You may need to reschedule your appointment if you arrive late (15 or more minutes).  Arriving late affects you and other patients whose appointments are after yours.  Also, if you miss three or more appointments without notifying the office, you may be dismissed from the clinic at the providers discretion.      For prescription refill requests, have your pharmacy contact our office and allow 72 hours for refills to be completed.    Today you received the following chemotherapy and/or immunotherapy agents trastuzumab and perjeta      To help prevent nausea and vomiting after your treatment, we encourage you to take your nausea medication as directed.  BELOW ARE SYMPTOMS THAT SHOULD BE REPORTED IMMEDIATELY: *FEVER GREATER THAN 100.4 F (38 C) OR HIGHER *CHILLS OR SWEATING *NAUSEA AND VOMITING THAT IS NOT CONTROLLED WITH YOUR NAUSEA MEDICATION *UNUSUAL SHORTNESS OF BREATH *UNUSUAL BRUISING OR BLEEDING *URINARY PROBLEMS (pain or burning when urinating, or frequent urination) *BOWEL PROBLEMS (unusual diarrhea, constipation, pain near the anus) TENDERNESS IN MOUTH AND THROAT WITH OR WITHOUT PRESENCE OF ULCERS (sore throat, sores in mouth, or a toothache) UNUSUAL RASH, SWELLING OR PAIN  UNUSUAL VAGINAL DISCHARGE OR ITCHING   Items with * indicate a potential emergency and should be followed up as soon as possible or go to the Emergency Department if any problems should occur.  Please show the CHEMOTHERAPY ALERT CARD or IMMUNOTHERAPY ALERT CARD at  check-in to the Emergency Department and triage nurse.  Should you have questions after your visit or need to cancel or reschedule your appointment, please contact [facility]  Dept: [phone]  and follow the prompts.  Office hours are 8:00 a.m. to 4:30 p.m. Monday - Friday. Please note that voicemails left after 4:00 p.m. may not be returned until the following business day.  We are closed weekends and major holidays. You have access to a nurse at all times for urgent questions. Please call the main number to the clinic Dept: [phone] and follow the prompts.   For any non-urgent questions, you may also contact your provider using MyChart. We now offer e-Visits for anyone 87 and older to request care online for non-urgent symptoms. For details visit [email or web address].   Also download the MyChart app! Go to the app store, search "MyChart", open the app, select [facility], and log in with your MyChart username and password.  Due to Covid, a mask is required upon entering the hospital/clinic. If you do not have a mask, one will be given to you upon arrival. For doctor visits, patients may have 1 support person aged 45 or older with them. For treatment visits, patients cannot have anyone with them due to current Covid guidelines and our immunocompromised population.

## 2021-12-18 NOTE — Progress Notes (Signed)
No reloading doses today (trastuzumab & pertuzumab) per Wilber Bihari, NP.  [facility], Pharm.D., CPP 12/18/2021@1 :51 PM

## 2021-12-19 ENCOUNTER — Telehealth: Payer: Self-pay | Admitting: Hematology and Oncology

## 2021-12-19 ENCOUNTER — Encounter: Payer: Self-pay | Admitting: *Deleted

## 2021-12-19 NOTE — Telephone Encounter (Signed)
Scheduled appointment per 01/04 los. Left message with appointment times.

## 2021-12-24 ENCOUNTER — Ambulatory Visit: Payer: Managed Care, Other (non HMO) | Attending: General Surgery

## 2021-12-24 ENCOUNTER — Other Ambulatory Visit: Payer: Self-pay

## 2021-12-24 VITALS — Wt 228.1 lb

## 2021-12-24 DIAGNOSIS — Z483 Aftercare following surgery for neoplasm: Secondary | ICD-10-CM | POA: Insufficient documentation

## 2021-12-24 NOTE — Therapy (Signed)
[facility] @ [address] [place], [place], [address] Phone: [phone]   Fax:  [phone]  Physical Therapy Treatment  Patient Details  Name: Diane Miranda MRN: [id number] Date of Birth: 05-22-1976 Referring Provider (PT): Magrinat, Virgie Dad, MD   Encounter Date: 12/24/2021   PT End of Session - 12/24/21 1058     Visit Number 2   # unchanged due to screen only   PT Start Time 1056    PT Stop Time 1100    PT Time Calculation (min) 4 min    Activity Tolerance Patient tolerated treatment well    Behavior During Therapy [facility] for tasks assessed/performed             Past Medical History:  Diagnosis Date   Allergy 2000   Asthma    as child   Breast cancer ([facility])    Family history of lung cancer 03/08/2021   Family history of uterine cancer 03/08/2021   GERD (gastroesophageal reflux disease)    Headache    History of COVID-19    Hyperlipidemia    Hypertension    Pre-diabetes    Prediabetes    right breast ca dx'd 02/2021   Trimalleolar fracture of ankle, closed, left, initial encounter     Past Surgical History:  Procedure Laterality Date   BREAST BIOPSY Right 03/22/2021   Procedure: PUNCH BIOPSY OF RIGHT AREOLA;  Surgeon: Stark Klein, MD;  Location: [facility];  Service: General;  Laterality: Right;   BREAST LUMPECTOMY WITH RADIOACTIVE SEED AND SENTINEL LYMPH NODE BIOPSY Right 08/29/2021   Procedure: RIGHT BREAST LUMPECTOMY WITH RADIOACTIVE SEED AND RIGHT AXILLARY SENTINEL LYMPH NODE BIOPSY;  Surgeon: Stark Klein, MD;  Location: [facility];  Service: General;  Laterality: Right;   FRACTURE SURGERY  2018   NO PAST SURGERIES     ORIF ANKLE FRACTURE Left 07/10/2017   Procedure: OPEN REDUCTION INTERNAL FIXATION (ORIF) trimallolar ANKLE FRACTURE;  Surgeon: Wylene Simmer, MD;  Location: [facility];  Service: Orthopedics;  Laterality: Left;   PORTACATH PLACEMENT Left 03/22/2021   Procedure:  INSERTION PORT-A-CATH;  Surgeon: Stark Klein, MD;  Location: [facility];  Service: General;  Laterality: Left;   RADIOACTIVE SEED GUIDED AXILLARY SENTINEL LYMPH NODE Right 08/29/2021   Procedure: RADIOACTIVE SEED GUIDED AXILLARY SENTINEL LYMPH NODE EXCISION;  Surgeon: Stark Klein, MD;  Location: [facility];  Service: General;  Laterality: Right;    Vitals:   12/24/21 1057  Weight: 228 lb 2 oz (103.5 kg)     Subjective Assessment - 12/24/21 1057     Subjective Pt returns for her 3 month L-Dex screen.    Pertinent History 02/15/21 breast cancer; now post chemo and rt lumpectomy                    L-DEX FLOWSHEETS - 12/24/21 1100       L-DEX LYMPHEDEMA SCREENING   Measurement Type Unilateral    L-DEX MEASUREMENT EXTREMITY Upper Extremity    POSITION  Standing    DOMINANT SIDE Right    At Risk Side Right    BASELINE SCORE (UNILATERAL) -5.2    L-DEX SCORE (UNILATERAL) 0.5    VALUE CHANGE (UNILAT) 5.7                                  PT Short Term Goals - 10/29/21 1319  PT SHORT TERM GOAL #1   Title Pt to be I with HEP    Time 4    Period Weeks    Status New    Target Date 11/26/21      PT SHORT TERM GOAL #2   Title pt to demonstrate 4/5 pelvic floor strength to decrease leakage symptoms.    Time 4    Period Weeks    Status New    Target Date 11/26/21               PT Long Term Goals - 10/29/21 1320       PT LONG TERM GOAL #1   Title pt to be I with advanced HEP    Time 3    Period Months    Status New    Target Date 01/29/22      PT LONG TERM GOAL #2   Title pt to demonstrate 5/5 pelvic floor strength for decreased urinary leakage symptoms.    Time 3    Period Months    Status New    Target Date 01/29/22      PT LONG TERM GOAL #3   Title pt to report no more than 1 urinary leak per month to improve QOL and leakage symptoms    Time 3    Period Months    Status New    Target Date 01/29/22       PT LONG TERM GOAL #4   Title pt to report improved with vaginal dryness symptoms, no longer preventing attempts at intercourse and improved ability to achieve orgasm.    Time 3    Period Months    Status New    Target Date 01/29/22                   Plan - 12/24/21 1058     Clinical Impression Statement Pt returns of rher 3 month L-Dex screen. Her change from baseline of 5.7 is WNLS so no further treatment is required at this time except to cont every 3 month L-Dex screens which pt is agreeable to.    PT Next Visit Plan Cont every 3 month L-Dex screens for up to 2 years from her SLNB (~02/16/2023)    Consulted and Agree with Plan of Care Patient             Patient will benefit from skilled therapeutic intervention in order to improve the following deficits and impairments:     Visit Diagnosis: Aftercare following surgery for neoplasm     Problem List Patient Active Problem List   Diagnosis Date Noted   Port-A-Cath in place 09/11/2021   History of COVID-19    Genetic testing 03/27/2021   Family history of uterine cancer 03/08/2021   Family history of lung cancer 03/08/2021   Malignant neoplasm of upper-outer quadrant of right breast in female, estrogen receptor positive ([facility]) 03/01/2021   SINUSITIS - ACUTE-NOS 09/07/2009   EXUDATIVE PHARYNGITIS 09/07/2009   LOW HDL 08/03/2008   HSV 08/01/2008   OBESITY 08/01/2008   ALLERGIC RHINITIS 08/01/2008   VAGINITIS 08/01/2008   HEADACHE 08/01/2008    Otelia Limes, PTA 12/24/2021, 11:02 AM  [facility] @ [address] [place], [place], [address] Phone: [phone]   Fax:  [phone]  Name: Diane Miranda MRN: [id number] Date of Birth: 05-28-1976

## 2021-12-25 ENCOUNTER — Encounter: Payer: Self-pay | Admitting: *Deleted

## 2021-12-31 ENCOUNTER — Other Ambulatory Visit: Payer: Self-pay

## 2021-12-31 ENCOUNTER — Ambulatory Visit (HOSPITAL_COMMUNITY)
Admission: RE | Admit: 2021-12-31 | Discharge: 2021-12-31 | Disposition: A | Discharge status: Home / Self Care | Payer: Commercial Managed Care - HMO | Source: Ambulatory Visit | Attending: Adult Health | Admitting: Adult Health

## 2021-12-31 DIAGNOSIS — I10 Essential (primary) hypertension: Secondary | ICD-10-CM

## 2021-12-31 DIAGNOSIS — Z01818 Encounter for other preprocedural examination: Secondary | ICD-10-CM | POA: Diagnosis not present

## 2021-12-31 DIAGNOSIS — Z0189 Encounter for other specified special examinations: Secondary | ICD-10-CM | POA: Diagnosis not present

## 2021-12-31 DIAGNOSIS — Z170 Estrogen receptor positive status [ER+]: Secondary | ICD-10-CM | POA: Diagnosis not present

## 2021-12-31 DIAGNOSIS — Z5181 Encounter for therapeutic drug level monitoring: Secondary | ICD-10-CM | POA: Diagnosis not present

## 2021-12-31 DIAGNOSIS — C50411 Malignant neoplasm of upper-outer quadrant of right female breast: Secondary | ICD-10-CM | POA: Diagnosis not present

## 2021-12-31 LAB — ECHOCARDIOGRAM COMPLETE
AR max vel: 3.19 cm2
AV Peak grad: 6.1 mmHg
Ao pk vel: 1.23 m/s
Area-P 1/2: 3.89 cm2
Calc EF: 62.8 %
S' Lateral: 2.9 cm
Single Plane A2C EF: 65.8 %
Single Plane A4C EF: 61.1 %

## 2021-12-31 MED ORDER — LOSARTAN POTASSIUM 50 MG PO TABS: 50.0000 mg | ORAL_TABLET | Freq: Every evening | ORAL | 1 refills | Status: DC

## 2022-01-02 ENCOUNTER — Encounter: Payer: Self-pay | Admitting: Radiation Oncology

## 2022-01-03 ENCOUNTER — Telehealth: Payer: Self-pay

## 2022-01-03 NOTE — Telephone Encounter (Signed)
Notified Patient of completion of [employer] Claim Form. Fax transmission confirmation received and copy placed for pick-up at Registration desk as requested by Patient.

## 2022-01-06 ENCOUNTER — Other Ambulatory Visit: Payer: Self-pay | Admitting: Hematology and Oncology

## 2022-01-06 NOTE — Progress Notes (Incomplete)
Radiation Oncology         [phone] ________________________________  Patient Name: Diane Miranda MRN: [id number] DOB: September 28, 1976 Referring Physician: Lillia Corporal (Profile Not Attached) Date of Service: 11/29/2021 [facility],                                                         End Of Treatment Note  Diagnoses: C50.411-Malignant neoplasm of upper-outer quadrant of right female breast  Cancer Staging: Status post right lumpectomy: Stage 0, Right Breast UOQ, microscopic foci of high grade DCIS, and no evidence of residual invasive ductal carcinoma, ER+ / PR+ / Her2+  Intent: Curative  Radiation Treatment Dates: 10/09/2021 through 11/29/2021 Site Technique Total Dose (Gy) Dose per Fx (Gy) Completed Fx Beam Energies  Breast, Right: Breast_Rt 3D 50.4/50.4 1.8 28/28 15X  Breast, Right: Breast_Rt_Bst 3D 12/12 2 6/6 6X, 10X  Sclav-RT: SCV_Rt 3D 50.4/50.4 1.8 28/28 10X, 15X   Narrative: The patient tolerated radiation therapy relatively well. She reports mild fatigue, and ongoing skin irritation.   On physical exam: hyperpigmentation noted to right breast, right supraclavicular area and right scapular area. Large area of moist desquamation present underneath right breast (inframammary fold) which is healing. She is using Radiaplex and neosporin as directed.   Plan: The patient will follow-up with radiation oncology in one month .  ________________________________________________ -----------------------------------  Blair Promise, PhD, MD  This document serves as a record of services personally performed by Gery Pray, MD. It was created on his behalf by Roney Mans, a trained medical scribe. The creation of this record is based on the scribe's personal observations and the provider's statements to them. This document has been checked and approved by the attending provider.

## 2022-01-06 NOTE — Progress Notes (Signed)
Radiation Oncology         [phone] ________________________________  Name: Diane Miranda MRN: [id number]  Date: 01/07/2022  DOB: July 10, 1976  Follow-Up Visit Note  CC: Pavelock, Ralene Bathe, MD  Pavelock, Ralene Bathe, MD    ICD-10-CM   1. Malignant neoplasm of upper-outer quadrant of right breast in female, estrogen receptor positive ([facility])  C50.411    Z17.0       Diagnosis:  Status post right lumpectomy: Stage 0, Right Breast UOQ, microscopic foci of high grade DCIS, and no evidence of residual invasive ductal carcinoma, ER+ / PR+ / Her2+  Interval Since Last Radiation: 1 month and 8 days   Intent: Curative  Radiation Treatment Dates: 10/09/2021 through 11/29/2021 Site Technique Total Dose (Gy) Dose per Fx (Gy) Completed Fx Beam Energies  Breast, Right: Breast_Rt 3D 50.4/50.4 1.8 28/28 15X  Breast, Right: Breast_Rt_Bst 3D 12/12 2 6/6 6X, 10X  Sclav-RT: SCV_Rt 3D 50.4/50.4 1.8 28/28 10X, 15X    Narrative:  The patient returns today for routine follow-up.  The patient tolerated radiation therapy relatively well other than mild fatigue, and ongoing skin irritation. Physical exam performed during her final weekly treatment check revealed hyperpigmentation to the right breast, right supraclavicular area, and right scapular area. A large area of moist desquamation was also appreciated underneath the right breast (inframammary fold) which appeared to be healing. She used Radiaplex and neosporin as directed.          While undergoing RT, the patient followed up with Dr. Jana Hakim on 10/23/21. During which time, the patient was noted to tolerate her anti-Her2 treatment well without any side effects. In regards to RT, the patient reported ongoing fatigue, but she reported that this didn't stop her from exercising and walking 3-4 times a week which is excellent.   In more recent history, the patient followed up with Wilber Bihari NP on 12/18/21 prior to treatment with Herceptin Perjeta.  During this visit, the patient reported some skin peeling during RT, and current slight pain in her right lower chest wall. Otherwise, the patient denied any other symptoms. The patient also decided against taking tamoxifen during this visit, but she will continue to consider this.  She denies any swelling in her right arm or hand.                      Allergies:  is allergic to hydrochlorothiazide.  Meds: Current Outpatient Medications  Medication Sig Dispense Refill   acetaminophen (TYLENOL) 500 MG tablet Take 1 tablet (500 mg total) by mouth 3 (three) times daily with meals as needed. Take with aleve 220 mg tablet 90 tablet 0   Fexofenadine HCl (ALLEGRA ALLERGY PO) Take 1 tablet by mouth daily.     gabapentin (NEURONTIN) 300 MG capsule Take 1 capsule (300 mg total) by mouth at bedtime. 90 capsule 4   lidocaine-prilocaine (EMLA) cream Apply 1 application topically as needed [facility]).     loratadine (CLARITIN) 10 MG tablet Take 1 tablet (10 mg total) by mouth daily. 90 tablet 4   losartan (COZAAR) 50 MG tablet Take 1 tablet (50 mg total) by mouth every evening. 90 tablet 1   metoprolol succinate (TOPROL-XL) 100 MG 24 hr tablet TAKE 1 TABLET BY MOUTH EVERY DAY IN THE MORNING 90 tablet 1   Multiple Vitamins-Minerals (MULTIVITAMIN WITH MINERALS) tablet Take 1 tablet by mouth daily.     rosuvastatin (CRESTOR) 10 MG tablet Take 10 mg by mouth daily.  No current facility-administered medications for this encounter.    Physical Findings: The patient is in no acute distress. Patient is alert and oriented.  height is 5' 4"  (1.626 m) and weight is 232 lb 3.2 oz (105.3 kg). Her temperature is 97.6 F (36.4 C). Her blood pressure is 129/84 and her pulse is 81. Her respiration is 20 and oxygen saturation is 100%. .  No significant changes. Lungs are clear to auscultation bilaterally. Heart has regular rate and rhythm. No palpable cervical, supraclavicular, or axillary adenopathy. Abdomen soft,  non-tender, normal bowel sounds.  The right breast shows hyperpigmentation changes.  Skin is well-healed at this time.  No dominant mass appreciated in the breast nipple discharge or bleeding.  Mild edema in the nipple areolar complex area.      Lab Findings: Lab Results  Component Value Date   WBC 6.0 12/18/2021   HGB 11.1 (L) 12/18/2021   HCT 32.9 (L) 12/18/2021   MCV 92.7 12/18/2021   PLT 263 12/18/2021    Radiographic Findings: ECHOCARDIOGRAM COMPLETE  Result Date: 12/31/2021    ECHOCARDIOGRAM REPORT   Patient Name:   Diane Miranda Date of Exam: 12/31/2021 Medical Rec #:  [id number]           Height:       64.0 in Accession #:    [id number]          Weight:       228.1 lb Date of Birth:  December 31, 1975           BSA:          2.069 m Patient Age:    46 years            BP:           135/75 mmHg Patient Gender: F                   HR:           93 bpm. Exam Location:  Outpatient Procedure: 2D Echo, Cardiac Doppler, Color Doppler and Strain Analysis Indications:    Chemo  History:        Patient has prior history of Echocardiogram examinations.  Sonographer:    Jyl Heinz Referring Phys: Van  1. Left ventricular ejection fraction, by estimation, is 60 to 65%. The left ventricle has normal function. The left ventricle has no regional wall motion abnormalities. Left ventricular diastolic parameters were normal. The average left ventricular global longitudinal strain is -19.4 %. The global longitudinal strain is normal.  2. Right ventricular systolic function is normal. The right ventricular size is normal. Tricuspid regurgitation signal is inadequate for assessing PA pressure.  3. The mitral valve is grossly normal. Trivial mitral valve regurgitation. No evidence of mitral stenosis.  4. The aortic valve is tricuspid. Aortic valve regurgitation is not visualized. No aortic stenosis is present.  5. The inferior vena cava is normal in size with greater than 50%  respiratory variability, suggesting right atrial pressure of 3 mmHg. Conclusion(s)/Recommendation(s): Normal biventricular function without evidence of hemodynamically significant valvular heart disease. FINDINGS  Left Ventricle: Left ventricular ejection fraction, by estimation, is 60 to 65%. The left ventricle has normal function. The left ventricle has no regional wall motion abnormalities. The average left ventricular global longitudinal strain is -19.4 %. The global longitudinal strain is normal. 3D left ventricular ejection fraction analysis performed but not reported based on interpreter judgement due to suboptimal tracking. The left ventricular internal  cavity size was normal in size. There is no left ventricular hypertrophy. Left ventricular diastolic parameters were normal. Right Ventricle: The right ventricular size is normal. No increase in right ventricular wall thickness. Right ventricular systolic function is normal. Tricuspid regurgitation signal is inadequate for assessing PA pressure. Left Atrium: Left atrial size was normal in size. Right Atrium: Right atrial size was normal in size. Pericardium: There is no evidence of pericardial effusion. Mitral Valve: The mitral valve is grossly normal. Trivial mitral valve regurgitation. No evidence of mitral valve stenosis. Tricuspid Valve: The tricuspid valve is grossly normal. Tricuspid valve regurgitation is trivial. No evidence of tricuspid stenosis. Aortic Valve: The aortic valve is tricuspid. Aortic valve regurgitation is not visualized. No aortic stenosis is present. Aortic valve peak gradient measures 6.1 mmHg. Pulmonic Valve: The pulmonic valve was grossly normal. Pulmonic valve regurgitation is not visualized. No evidence of pulmonic stenosis. Aorta: The aortic root and ascending aorta are structurally normal, with no evidence of dilitation. Venous: The inferior vena cava is normal in size with greater than 50% respiratory variability, suggesting  right atrial pressure of 3 mmHg. IAS/Shunts: The atrial septum is grossly normal.  LEFT VENTRICLE PLAX 2D LVIDd:         3.80 cm     Diastology LVIDs:         2.90 cm     LV e' medial:    6.53 cm/s LV PW:         0.90 cm     LV E/e' medial:  11.9 LV IVS:        0.90 cm     LV e' lateral:   8.38 cm/s LVOT diam:     2.00 cm     LV E/e' lateral: 9.3 LV SV:         76 LV SV Index:   37          2D Longitudinal Strain LVOT Area:     3.14 cm    2D Strain GLS Avg:     -19.4 %  LV Volumes (MOD) LV vol d, MOD A2C: 77.5 ml 3D Volume EF: LV vol d, MOD A4C: 82.2 ml 3D EF:        59 % LV vol s, MOD A2C: 26.5 ml LV EDV:       80 ml LV vol s, MOD A4C: 32.0 ml LV ESV:       33 ml LV SV MOD A2C:     51.0 ml LV SV:        47 ml LV SV MOD A4C:     82.2 ml LV SV MOD BP:      50.6 ml RIGHT VENTRICLE            IVC RV Basal diam:  3.00 cm    IVC diam: 1.80 cm RV Mid diam:    2.20 cm RV S prime:     9.90 cm/s TAPSE (M-mode): 2.3 cm LEFT ATRIUM             Index        RIGHT ATRIUM           Index LA diam:        3.40 cm 1.64 cm/m   RA Area:     12.50 cm LA Vol (A2C):   48.4 ml 23.39 ml/m  RA Volume:   29.80 ml  14.40 ml/m LA Vol (A4C):   40.2 ml 19.43 ml/m LA Biplane Vol: 44.8 ml 21.65  ml/m  AORTIC VALVE AV Area (Vmax): 3.19 cm AV Vmax:        123.00 cm/s AV Peak Grad:   6.1 mmHg LVOT Vmax:      125.00 cm/s LVOT Vmean:     93.000 cm/s LVOT VTI:       0.241 m  AORTA Ao Root diam: 2.80 cm Ao Asc diam:  2.90 cm MITRAL VALVE MV Area (PHT): 3.89 cm    SHUNTS MV Decel Time: 195 msec    Systemic VTI:  0.24 m MV E velocity: 78.00 cm/s  Systemic Diam: 2.00 cm MV A velocity: 67.30 cm/s MV E/A ratio:  1.16 Eleonore Chiquito MD Electronically signed by Eleonore Chiquito MD Signature Date/Time: 12/31/2021/2:03:56 PM    Final     Impression:  Status post right lumpectomy: Stage 0, Right Breast UOQ, microscopic foci of high grade DCIS, and no evidence of residual invasive ductal carcinoma, ER+ / PR+ / Her2+  The patient is recovering from the  effects of radiation.  No evidence of recurrence on clinical exam today  Plan: As needed follow-up in radiation oncology.  The patient will continue on Herceptin/Perjeta.    ____________________________________  Blair Promise, PhD, MD  This document serves as a record of services personally performed by Gery Pray, MD. It was created on his behalf by Roney Mans, a trained medical scribe. The creation of this record is based on the scribe's personal observations and the provider's statements to them. This document has been checked and approved by the attending provider.

## 2022-01-07 ENCOUNTER — Other Ambulatory Visit: Payer: Self-pay | Admitting: *Deleted

## 2022-01-07 ENCOUNTER — Other Ambulatory Visit: Payer: Self-pay

## 2022-01-07 ENCOUNTER — Inpatient Hospital Stay: Payer: Commercial Managed Care - HMO

## 2022-01-07 ENCOUNTER — Inpatient Hospital Stay (HOSPITAL_BASED_OUTPATIENT_CLINIC_OR_DEPARTMENT_OTHER): Payer: Commercial Managed Care - HMO | Admitting: Hematology and Oncology

## 2022-01-07 ENCOUNTER — Encounter: Payer: Self-pay | Admitting: *Deleted

## 2022-01-07 ENCOUNTER — Encounter: Payer: Self-pay | Admitting: Radiation Oncology

## 2022-01-07 ENCOUNTER — Encounter: Payer: Self-pay | Admitting: Hematology and Oncology

## 2022-01-07 ENCOUNTER — Ambulatory Visit
Admission: RE | Admit: 2022-01-07 | Discharge: 2022-01-07 | Disposition: A | Discharge status: Home / Self Care | Payer: Commercial Managed Care - HMO | Source: Ambulatory Visit | Attending: Radiation Oncology | Admitting: Radiation Oncology

## 2022-01-07 VITALS — BP 129/84 | HR 81 | Temp 97.6°F | Resp 20 | Ht 64.0 in | Wt 232.2 lb

## 2022-01-07 DIAGNOSIS — Z79899 Other long term (current) drug therapy: Secondary | ICD-10-CM | POA: Insufficient documentation

## 2022-01-07 DIAGNOSIS — C50411 Malignant neoplasm of upper-outer quadrant of right female breast: Secondary | ICD-10-CM

## 2022-01-07 DIAGNOSIS — Z170 Estrogen receptor positive status [ER+]: Secondary | ICD-10-CM | POA: Insufficient documentation

## 2022-01-07 DIAGNOSIS — Z95828 Presence of other vascular implants and grafts: Secondary | ICD-10-CM

## 2022-01-07 DIAGNOSIS — Z923 Personal history of irradiation: Secondary | ICD-10-CM | POA: Insufficient documentation

## 2022-01-07 DIAGNOSIS — Z5111 Encounter for antineoplastic chemotherapy: Secondary | ICD-10-CM | POA: Diagnosis not present

## 2022-01-07 DIAGNOSIS — R5383 Other fatigue: Secondary | ICD-10-CM | POA: Insufficient documentation

## 2022-01-07 HISTORY — DX: Personal history of irradiation: Z92.3

## 2022-01-07 LAB — COMPREHENSIVE METABOLIC PANEL
ALT: 9 U/L (ref 0–44)
AST: 11 U/L — ABNORMAL LOW (ref 15–41)
Albumin: 3.7 g/dL (ref 3.5–5.0)
Alkaline Phosphatase: 55 U/L (ref 38–126)
Anion gap: 6 (ref 5–15)
BUN: 9 mg/dL (ref 6–20)
CO2: 26 mmol/L (ref 22–32)
Calcium: 8.7 mg/dL — ABNORMAL LOW (ref 8.9–10.3)
Chloride: 109 mmol/L (ref 98–111)
Creatinine, Ser: 0.85 mg/dL (ref 0.44–1.00)
GFR, Estimated: >60 mL/min (ref >60)
Glucose, Bld: 103 mg/dL — ABNORMAL HIGH (ref 70–99)
Potassium: 3.9 mmol/L (ref 3.5–5.1)
Sodium: 141 mmol/L (ref 135–145)
Total Bilirubin: 0.3 mg/dL (ref 0.3–1.2)
Total Protein: 6.5 g/dL (ref 6.5–8.1)

## 2022-01-07 LAB — CBC WITH DIFFERENTIAL/PLATELET
Abs Immature Granulocytes: 0.02 10*3/uL (ref 0.00–0.07)
Basophils Absolute: 0 10*3/uL (ref 0.0–0.1)
Basophils Relative: 0 %
Eosinophils Absolute: 0.2 10*3/uL (ref 0.0–0.5)
Eosinophils Relative: 3 %
HCT: 33.6 % — ABNORMAL LOW (ref 36.0–46.0)
Hemoglobin: 11.4 g/dL — ABNORMAL LOW (ref 12.0–15.0)
Immature Granulocytes: 0 %
Lymphocytes Relative: 23 %
Lymphs Abs: 1.3 10*3/uL (ref 0.7–4.0)
MCH: 31.9 pg (ref 26.0–34.0)
MCHC: 33.9 g/dL (ref 30.0–36.0)
MCV: 94.1 fL (ref 80.0–100.0)
Monocytes Absolute: 0.4 10*3/uL (ref 0.1–1.0)
Monocytes Relative: 7 %
Neutro Abs: 3.8 10*3/uL (ref 1.7–7.7)
Neutrophils Relative %: 67 %
Platelets: 245 10*3/uL (ref 150–400)
RBC: 3.57 MIL/uL — ABNORMAL LOW (ref 3.87–5.11)
RDW: 15.6 % — ABNORMAL HIGH (ref 11.5–15.5)
WBC: 5.7 10*3/uL (ref 4.0–10.5)
nRBC: 0 % (ref 0.0–0.2)

## 2022-01-07 MED ORDER — SODIUM CHLORIDE 0.9% FLUSH
10.0000 mL | Freq: Once | INTRAVENOUS | Status: AC
  Administered 2022-01-07: 10 mL

## 2022-01-07 MED ORDER — SODIUM CHLORIDE 0.9% FLUSH: 10.0000 mL | INTRAVENOUS | Status: DC | PRN

## 2022-01-07 MED ORDER — SODIUM CHLORIDE 0.9 % IV SOLN: 420.0000 mg | Freq: Once | INTRAVENOUS | Status: DC

## 2022-01-07 MED ORDER — DIPHENHYDRAMINE HCL 25 MG PO CAPS: 25.0000 mg | ORAL_CAPSULE | Freq: Once | ORAL | Status: DC

## 2022-01-07 MED ORDER — ACETAMINOPHEN 325 MG PO TABS: 650.0000 mg | ORAL_TABLET | Freq: Once | ORAL | Status: DC

## 2022-01-07 MED ORDER — HEPARIN SOD (PORK) LOCK FLUSH 100 UNIT/ML IV SOLN: 500.0000 [IU] | Freq: Once | INTRAVENOUS | Status: DC | PRN

## 2022-01-07 MED ORDER — HEPARIN SOD (PORK) LOCK FLUSH 100 UNIT/ML IV SOLN
500.0000 [IU] | Freq: Once | INTRAVENOUS | Status: AC
  Administered 2022-01-07: 500 [IU] via INTRAVENOUS

## 2022-01-07 MED ORDER — SODIUM CHLORIDE 0.9% FLUSH
10.0000 mL | Freq: Once | INTRAVENOUS | Status: AC
  Administered 2022-01-07: 10 mL via INTRAVENOUS

## 2022-01-07 MED ORDER — TRASTUZUMAB-ANNS CHEMO 150 MG IV SOLR: 6.0000 mg/kg | Freq: Once | INTRAVENOUS | Status: DC

## 2022-01-07 MED ORDER — SODIUM CHLORIDE 0.9 % IV SOLN: Freq: Once | INTRAVENOUS | Status: AC

## 2022-01-07 NOTE — Progress Notes (Signed)
[place]  [phone] [phone]    ID: Diane Miranda DOB: 07-30-1976  MR#: [id number]  CSN#:[id number]  Patient Care Team: [name], MD as PCP - General (Internal Medicine) Mauro Kaufmann, RN as Oncology Nurse Navigator Rockwell Germany, RN as Oncology Nurse Navigator Stark Klein, MD as Consulting Physician (General Surgery) Gery Pray, MD as Consulting Physician (Radiation Oncology) Wylene Simmer, MD as Consulting Physician (Orthopedic Surgery) Benay Pike, MD as Consulting Physician (Hematology and Oncology) Benay Pike, MD OTHER MD:  CHIEF COMPLAINT: triple positive breast cancer  CURRENT TREATMENT: anti-HER2 treatment; adjuvant radiation   INTERVAL HISTORY:  Diane Miranda returns today for follow up and treatment of her triple positive breast cancer.  She continues on anti-HER2 treatment every 21 days .  She completed adjuvant radiation and tolerated it well.  Since her last visit, she denies any new health complaints.  She has some intermittent diarrhea for which she takes Imodium but no other major complaints from the current antibiotic therapy.  She is very reluctant to go on tamoxifen for antiestrogen therapy.  She tells me that she has removed her own research before she agrees to tamoxifen and we have very clearly discussed the risks and benefits of tamoxifen. Rest of the pertinent 10 point ROS reviewed and negative.   REVIEW OF SYSTEMS: Review of Systems  Constitutional:  Negative for appetite change, chills, fatigue, fever and unexpected weight change.  HENT:   Negative for hearing loss, lump/mass and trouble swallowing.   Eyes:  Negative for eye problems and icterus.  Respiratory:  Negative for chest tightness, cough and shortness of breath.   Cardiovascular:  Negative for chest pain, leg swelling and palpitations.  Gastrointestinal:  Negative for abdominal distention, abdominal pain, constipation,  diarrhea, nausea and vomiting.  Endocrine: Negative for hot flashes.  Genitourinary:  Negative for difficulty urinating.   Musculoskeletal:  Negative for arthralgias.  Skin:  Negative for itching and rash.  Neurological:  Negative for dizziness, extremity weakness, headaches and numbness.  Hematological:  Negative for adenopathy. Does not bruise/bleed easily.  Psychiatric/Behavioral:  Negative for depression. The patient is not nervous/anxious.      COVID 19 VACCINATION STATUS: refuses vaccination; infection 10/2019   HISTORY OF CURRENT ILLNESS: From the original intake note:  Diane Miranda herself palpated a right breast mass and noted associated pain. She underwent bilateral diagnostic mammography with tomography and right breast ultrasonography at [facility] on 02/15/2021 showing: breast density category B; palpable 5.4 cm mass within outer right breast; two abnormal right axillary lymph nodes; 1.8 cm indeterminate hypoechoic area within upper-outer right breast; no evidence of left breast malignancy.  Accordingly on 02/23/2021 she proceeded to biopsy of the right breast areas in question. The pathology from this procedure ([id number]) showed:  1. Right Breast, 11 o'clock  - adenosis and columnar cell hyperplasia; this was concordant 2. Right Breast, 9 o'clock  - invasive ductal carcinoma, grade 3  - Prognostic indicators significant for: estrogen receptor, 40% positive with moderate staining intensity and progesterone receptor, 60% positive with strong staining intensity. Proliferation marker Ki67 at 35%. HER2 positive by immunohistochemistry (3+). 3. Right Axilla, lymph node  - invasive ductal carcinoma involving nodal tissue  She underwent breast MRI on 03/04/2021 showing: breast composition B; 4.7 cm mass in 8-9 o'clock position of posterior right breast represents recently diagnosed malignancy; two adjacent abnormal right axillary lymph nodes, one of which represents the  biopsy-proven metastatic node; significant skin thickening in periareolar  right breast, with significant increased T2 signal consistent with edema; mild abnormal enhancement within thickened skin, nonspecific.  There was also a 1.1 cm nonspecific enhancing mass in left breast at 2-3 o'clock.   Cancer Staging  Malignant neoplasm of upper-outer quadrant of right breast in female, estrogen receptor positive ([place]) Staging form: Breast, AJCC 8th Edition - Clinical: Stage IIB (cT3, cN1, cM0, G3, ER+, PR+, HER2+) - Signed by Chauncey Cruel, MD on 03/07/2021 Histologic grading system: 3 grade system   The patient's subsequent history is as detailed below.   PAST MEDICAL HISTORY: Past Medical History:  Diagnosis Date   Allergy 2000   Asthma    as child   Breast cancer ([place])    Family history of lung cancer 03/08/2021   Family history of uterine cancer 03/08/2021   GERD (gastroesophageal reflux disease)    Headache    History of COVID-19    History of radiation therapy    Right breast- 10/09/21-11/29/21- Dr. Gery Pray   Hyperlipidemia    Hypertension    Pre-diabetes    Prediabetes    right breast ca dx'd 03-11-21   Trimalleolar fracture of ankle, closed, left, initial encounter     PAST SURGICAL HISTORY: Past Surgical History:  Procedure Laterality Date   BREAST BIOPSY Right 03/22/2021   Procedure: PUNCH BIOPSY OF RIGHT AREOLA;  Surgeon: Stark Klein, MD;  Location: [facility];  Service: General;  Laterality: Right;   BREAST LUMPECTOMY WITH RADIOACTIVE SEED AND SENTINEL LYMPH NODE BIOPSY Right 08/29/2021   Procedure: RIGHT BREAST LUMPECTOMY WITH RADIOACTIVE SEED AND RIGHT AXILLARY SENTINEL LYMPH NODE BIOPSY;  Surgeon: Stark Klein, MD;  Location: [facility];  Service: General;  Laterality: Right;   FRACTURE SURGERY  2018   NO PAST SURGERIES     ORIF ANKLE FRACTURE Left 07/10/2017   Procedure: OPEN REDUCTION INTERNAL FIXATION (ORIF) trimallolar ANKLE FRACTURE;  Surgeon:  Wylene Simmer, MD;  Location: [facility];  Service: Orthopedics;  Laterality: Left;   PORTACATH PLACEMENT Left 03/22/2021   Procedure: INSERTION PORT-A-CATH;  Surgeon: Stark Klein, MD;  Location: [facility];  Service: General;  Laterality: Left;   RADIOACTIVE SEED GUIDED AXILLARY SENTINEL LYMPH NODE Right 08/29/2021   Procedure: RADIOACTIVE SEED GUIDED AXILLARY SENTINEL LYMPH NODE EXCISION;  Surgeon: Stark Klein, MD;  Location: [facility];  Service: General;  Laterality: Right;    FAMILY HISTORY: Family History  Problem Relation Age of Onset   Early death Mother    Heart disease Mother    Hypertension Mother    Diabetes Father    Hypertension Father    Diabetes Brother    Hearing loss Maternal Grandmother    Hypertension Maternal Grandmother    Uterine cancer Other 54       MGM's sister   Throat cancer Other        MGM's brother; dx after 60   Lung cancer Other        PGM's sister; dx 59s   Her father is living at age 76 as of 2021/03/11. Her mother died at age 52 from [place]. Arnie has two brothers (and no sisters). She reports endometrial cancer in a maternal great aunt and lung cancer in a paternal aunt. There is no other family history of cancer to her knowledge.   GYNECOLOGIC HISTORY:  No LMP recorded. Menarche: 46 years old Age at first live birth: 46 years old [place] P 2 LMP early 11-Mar-2021 (has since had IUD placed) Contraceptive:  IUD in place [place]), previously used pills from 1996-2006 HRT n/a  Hysterectomy? no BSO? 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 She is single. She lives at home with daughter Joylene Draft (age 54), adopted daughter Hetty Blend (age 29), and the patient's maternal grandmother, who has significant dementia issues. Daughter Levy Pupa, age 68, works in Press photographer in [place]. Khalessi is a Restaurant manager, fast food.    ADVANCED DIRECTIVES: not in place; however  the patient made it clear during her 03/07/2021 visit that she would not accept blood product transfusions for any reason including immediately life saving reasons   HEALTH MAINTENANCE: Social History   Tobacco Use   Smoking status: Never   Smokeless tobacco: Never  Vaping Use   Vaping Use: Never used  Substance Use Topics   Alcohol use: Yes    Comment: social   Drug use: No     Colonoscopy: n/a (age)  PAP: 2020  Bone density: n/a (age)   Allergies  Allergen Reactions   Hydrochlorothiazide Anaphylaxis and Swelling    Current Outpatient Medications  Medication Sig Dispense Refill   acetaminophen (TYLENOL) 500 MG tablet Take 1 tablet (500 mg total) by mouth 3 (three) times daily with meals as needed. Take with aleve 220 mg tablet 90 tablet 0   Fexofenadine HCl (ALLEGRA ALLERGY PO) Take 1 tablet by mouth daily.     gabapentin (NEURONTIN) 300 MG capsule Take 1 capsule (300 mg total) by mouth at bedtime. 90 capsule 4   lidocaine-prilocaine (EMLA) cream Apply 1 application topically as needed [facility]).     loratadine (CLARITIN) 10 MG tablet Take 1 tablet (10 mg total) by mouth daily. 90 tablet 4   losartan (COZAAR) 50 MG tablet Take 1 tablet (50 mg total) by mouth every evening. 90 tablet 1   metoprolol succinate (TOPROL-XL) 100 MG 24 hr tablet TAKE 1 TABLET BY MOUTH EVERY DAY IN THE MORNING 90 tablet 1   Multiple Vitamins-Minerals (MULTIVITAMIN WITH MINERALS) tablet Take 1 tablet by mouth daily.     rosuvastatin (CRESTOR) 10 MG tablet Take 10 mg by mouth daily.     No current facility-administered medications for this visit.    OBJECTIVE: African-American woman who appears stated age  There were no vitals filed for this visit.       There is no height or weight on file to calculate BMI.   Wt Readings from Last 3 Encounters:  01/07/22 232 lb 3.2 oz (105.3 kg)  12/24/21 228 lb 2 oz (103.5 kg)  12/18/21 227 lb (103 kg)     ECOG FS:1 - Symptomatic but completely  ambulatory GENERAL: Patient is a well appearing female in no acute distress HEENT:  Sclerae anicteric.  Oropharynx clear and moist. No ulcerations or evidence of oropharyngeal candidiasis. Neck is supple.  NODES:  No cervical, supraclavicular, or axillary lymphadenopathy palpated.  BREAST EXAM:  Deferred. LUNGS:  Clear to auscultation bilaterally.  No wheezes or rhonchi. HEART:  Regular rate and rhythm. No murmur appreciated. ABDOMEN:  Soft, nontender.  Positive, normoactive bowel sounds. No organomegaly palpated. MSK:  No focal spinal tenderness to palpation. Full range of motion bilaterally in the upper extremities. EXTREMITIES:  No peripheral edema.   SKIN:  Clear with no obvious rashes or skin changes. No nail dyscrasia. NEURO:  Nonfocal. Well oriented.  Appropriate affect.    LAB RESULTS:  CMP     Component Value Date/Time   NA 142 12/18/2021  1224   K 3.7 12/18/2021 1224   CL 109 12/18/2021 1224   CO2 28 12/18/2021 1224   GLUCOSE 98 12/18/2021 1224   GLUCOSE 105 (H) 11/12/2006 1022   BUN 9 12/18/2021 1224   CREATININE 0.91 12/18/2021 1224   CREATININE 0.88 11/13/2021 0953   CALCIUM 8.6 (L) 12/18/2021 1224   PROT 6.3 (L) 12/18/2021 1224   ALBUMIN 3.6 12/18/2021 1224   AST 9 (L) 12/18/2021 1224   AST 13 (L) 11/13/2021 0953   ALT 11 12/18/2021 1224   ALT 12 11/13/2021 0953   ALKPHOS 55 12/18/2021 1224   BILITOT 0.5 12/18/2021 1224   BILITOT 0.4 11/13/2021 0953   GFRNONAA >60 12/18/2021 1224   GFRNONAA >60 11/13/2021 0953   GFRAA >90 01/30/2015 1319    No results found for: TOTALPROTELP, ALBUMINELP, A1GS, A2GS, BETS, BETA2SER, GAMS, MSPIKE, SPEI  Lab Results  Component Value Date   WBC 6.0 12/18/2021   NEUTROABS 4.1 12/18/2021   HGB 11.1 (L) 12/18/2021   HCT 32.9 (L) 12/18/2021   MCV 92.7 12/18/2021   PLT 263 12/18/2021    No results found for: LABCA2  No components found for: ALPFXT024  No results for input(s): INR in the last 168 hours.  No results  found for: LABCA2  No results found for: OXB353  No results found for: GDJ242  No results found for: AST419  No results found for: CA2729  No components found for: HGQUANT  No results found for: CEA1 / No results found for: CEA1   No results found for: AFPTUMOR  No results found for: CHROMOGRNA  No results found for: KPAFRELGTCHN, LAMBDASER, KAPLAMBRATIO (kappa/lambda light chains)  No results found for: HGBA, HGBA2QUANT, HGBFQUANT, HGBSQUAN (Hemoglobinopathy evaluation)   No results found for: LDH  No results found for: IRON, TIBC, IRONPCTSAT (Iron and TIBC)  No results found for: FERRITIN  Urinalysis    Component Value Date/Time   COLORURINE YELLOW 01/30/2015 [place] 01/30/2015 1343   LABSPEC 1.011 01/30/2015 1343   PHURINE 7.5 01/30/2015 1343   GLUCOSEU NEGATIVE 01/30/2015 1343   HGBUR NEGATIVE 01/30/2015 1343   BILIRUBINUR NEGATIVE 01/30/2015 1343   KETONESUR NEGATIVE 01/30/2015 1343   PROTEINUR NEGATIVE 01/30/2015 1343   UROBILINOGEN 0.2 01/30/2015 1343   NITRITE NEGATIVE 01/30/2015 1343   LEUKOCYTESUR SMALL (A) 01/30/2015 1343    STUDIES: ECHOCARDIOGRAM COMPLETE  Result Date: 12/31/2021    ECHOCARDIOGRAM REPORT   Patient Name:   JOLEA DOLLE Dumler Date of Exam: 12/31/2021 Medical Rec #:  [id number]           Height:       64.0 in Accession #:    [id number]          Weight:       228.1 lb Date of Birth:  [birth year]           BSA:          2.069 m Patient Age:    10 years            BP:           135/75 mmHg Patient Gender: F                   HR:           93 bpm. Exam Location:  Outpatient Procedure: 2D Echo, Cardiac Doppler, Color Doppler and Strain Analysis Indications:    Chemo  History:        Patient has prior  history of Echocardiogram examinations.  Sonographer:    Jyl Heinz Referring Phys: [place]  1. Left ventricular ejection fraction, by estimation, is 60 to 65%. The left ventricle has normal  function. The left ventricle has no regional wall motion abnormalities. Left ventricular diastolic parameters were normal. The average left ventricular global longitudinal strain is -19.4 %. The global longitudinal strain is normal.  2. Right ventricular systolic function is normal. The right ventricular size is normal. Tricuspid regurgitation signal is inadequate for assessing PA pressure.  3. The mitral valve is grossly normal. Trivial mitral valve regurgitation. No evidence of mitral stenosis.  4. The aortic valve is tricuspid. Aortic valve regurgitation is not visualized. No aortic stenosis is present.  5. The inferior vena cava is normal in size with greater than 50% respiratory variability, suggesting right atrial pressure of 3 mmHg. Conclusion(s)/Recommendation(s): Normal biventricular function without evidence of hemodynamically significant valvular heart disease. FINDINGS  Left Ventricle: Left ventricular ejection fraction, by estimation, is 60 to 65%. The left ventricle has normal function. The left ventricle has no regional wall motion abnormalities. The average left ventricular global longitudinal strain is -19.4 %. The global longitudinal strain is normal. 3D left ventricular ejection fraction analysis performed but not reported based on interpreter judgement due to suboptimal tracking. The left ventricular internal cavity size was normal in size. There is no left ventricular hypertrophy. Left ventricular diastolic parameters were normal. Right Ventricle: The right ventricular size is normal. No increase in right ventricular wall thickness. Right ventricular systolic function is normal. Tricuspid regurgitation signal is inadequate for assessing PA pressure. Left Atrium: Left atrial size was normal in size. Right Atrium: Right atrial size was normal in size. Pericardium: There is no evidence of pericardial effusion. Mitral Valve: The mitral valve is grossly normal. Trivial mitral valve regurgitation. No  evidence of mitral valve stenosis. Tricuspid Valve: The tricuspid valve is grossly normal. Tricuspid valve regurgitation is trivial. No evidence of tricuspid stenosis. Aortic Valve: The aortic valve is tricuspid. Aortic valve regurgitation is not visualized. No aortic stenosis is present. Aortic valve peak gradient measures 6.1 mmHg. Pulmonic Valve: The pulmonic valve was grossly normal. Pulmonic valve regurgitation is not visualized. No evidence of pulmonic stenosis. Aorta: The aortic root and ascending aorta are structurally normal, with no evidence of dilitation. Venous: The inferior vena cava is normal in size with greater than 50% respiratory variability, suggesting right atrial pressure of 3 mmHg. IAS/Shunts: The atrial septum is grossly normal.  LEFT VENTRICLE PLAX 2D LVIDd:         3.80 cm     Diastology LVIDs:         2.90 cm     LV e' medial:    6.53 cm/s LV PW:         0.90 cm     LV E/e' medial:  11.9 LV IVS:        0.90 cm     LV e' lateral:   8.38 cm/s LVOT diam:     2.00 cm     LV E/e' lateral: 9.3 LV SV:         76 LV SV Index:   37          2D Longitudinal Strain LVOT Area:     3.14 cm    2D Strain GLS Avg:     -19.4 %  LV Volumes (MOD) LV vol d, MOD A2C: 77.5 ml 3D Volume EF: LV vol d, MOD A4C: 82.2  ml 3D EF:        59 % LV vol s, MOD A2C: 26.5 ml LV EDV:       80 ml LV vol s, MOD A4C: 32.0 ml LV ESV:       33 ml LV SV MOD A2C:     51.0 ml LV SV:        47 ml LV SV MOD A4C:     82.2 ml LV SV MOD BP:      50.6 ml RIGHT VENTRICLE            IVC RV Basal diam:  3.00 cm    IVC diam: 1.80 cm RV Mid diam:    2.20 cm RV S prime:     9.90 cm/s TAPSE (M-mode): 2.3 cm LEFT ATRIUM             Index        RIGHT ATRIUM           Index LA diam:        3.40 cm 1.64 cm/m   RA Area:     12.50 cm LA Vol (A2C):   48.4 ml 23.39 ml/m  RA Volume:   29.80 ml  14.40 ml/m LA Vol (A4C):   40.2 ml 19.43 ml/m LA Biplane Vol: 44.8 ml 21.65 ml/m  AORTIC VALVE AV Area (Vmax): 3.19 cm AV Vmax:        123.00 cm/s AV Peak  Grad:   6.1 mmHg LVOT Vmax:      125.00 cm/s LVOT Vmean:     93.000 cm/s LVOT VTI:       0.241 m  AORTA Ao Root diam: 2.80 cm Ao Asc diam:  2.90 cm MITRAL VALVE MV Area (PHT): 3.89 cm    SHUNTS MV Decel Time: 195 msec    Systemic VTI:  0.24 m MV E velocity: 78.00 cm/s  Systemic Diam: 2.00 cm MV A velocity: 67.30 cm/s MV E/A ratio:  1.16 Eleonore Chiquito MD Electronically signed by Eleonore Chiquito MD Signature Date/Time: 12/31/2021/2:03:56 PM    Final      ELIGIBLE FOR AVAILABLE RESEARCH PROTOCOL:no  ASSESSMENT: 46 y.o.  woman status post right breast upper outer quadrant biopsy 02/23/2021 for a clinical T3 N1, stage IIB invasive ductal carcinoma, grade 3, triple positive, with an MIB-1 of 35%  (a) right periareolar biopsy 02/23/2021 was benign/concordant  (b) left breast nodule biopsy 03/14/2021 benign  (c) Punch biopsy of the right areolar area 03/22/2021 no malignancy  (d) CT of the chest abdomen and pelvis 03/20/2021 shows no evidence of metastatic disease; there is 2.5 cm cystic pancreatic mass and a 0.4 cm right liver lobe lesion requiring further follow-up  (e) bone scan 03/25/2021 shows no bone metastases  (1) genetics testing 03/26/2021 through the [employer] panel offered by [facility] found no deleterious mutations in APC, ATM, AXIN2, BARD1, BMPR1A, BRCA1, BRCA2, BRIP1, CDH1, CDK4, CDKN2A, CHEK2, DICER1, EPCAM, GREM1, HOXB13, MEN1, MLH1, MSH2, MSH3, MSH6, MUTYH, NBN, NF1, NF2, NTHL1, PALB2, PMS2, POLD1, POLE, PTEN, RAD51C, RAD51D, RECQL, RET, SDHA, SDHAF2, SDHB, SDHC, SDHD, SMAD4, SMARCA4, STK11, TP53, TSC1, TSC2, and VHL.  RNA data is routinely analyzed for use in variant interpretation for all genes.  (2) neoadjuvant chemotherapy consisting of carboplatin, docetaxel, trastuzumab and pertuzumab every 21 days x 6 beginning 03/28/2021, completed 07/10/2021  (3) trastuzumab and pertuzumab to continue to total 1 year  (a) echo 03/13/2021 showed an ejection fraction  in the 60- 65% range  (b) echo  06/27/2021 showed an ejection fraction in the 65-70%  (c) echo 09/25/2021 shows an ejection fraction in the 60-65% range.  (4) right lumpectomy and sentinel lymph node sampling 08/29/2021 showed no residual invasive disease in the breast or lymph nodes, rare foci of residual DCIS the largest measuring 0.1 cm, with negative margins:ypT(is) ypN0  (a) 5 right  axillary lymph nodes were removed  (5) adjuvant radiation completed 11/27/2021  (6) antiestrogens recommended.   PLAN:  Patient has been tolerating dual anti-HER2 directed therapy very well.  She completed adjuvant radiation middle of December.  Dr. Jana Hakim and [facility] have previously mentioned role of antiestrogen therapy.  Patient is however very reluctant to try antiestrogen therapy.  I have very clearly discussed role of tamoxifen, mechanism of action of tamoxifen, adverse effects of tamoxifen including but not limited to postmenopausal symptoms, increased risk of DVT/PE, increased risk of endometrial cancer, cardiovascular events.  The benefit from tamoxifen would be improved bone density.  I have explained to her that this is a standard protocol to recommend antiestrogen therapy after completion of local treatment especially when patients have triple positive breast cancer.  She is very reluctant to try tamoxifen.  She tells me that she has to do her own research before she agrees to this.  I have recommended that she think about this and let us know if she is willing to consider it.  Again with regards to her abdominal MRI since she had a 4 mm liver lesion, this has not been scheduled, patient canceled this because of some insurance changes.  We will try to reschedule this at this time.  I spent 40 minutes in the care of this patient total, had to review her old records since she is a new patient to me, history and physical, discussion about role of antiestrogen therapy, counseling and coordination of care.  She  understands that echo has to be repeated every 3 months, most recent echo without any significant changes hence we will continue to proceed with Herceptin and Perjeta.  Return to clinic with me every 6 weeks and every 3 weeks for labs and infusion.  Repeat echo due in April 2023.  Total encounter time 40 minutes.*In face-to-face visit time, chart review, lab review, care coordination, and documentation of the encounter.   Benay Pike MD   *Total Encounter Time as defined by the Centers for Medicare and Medicaid Services includes, in addition to the face-to-face time of a patient visit (documented in the note above) non-face-to-face time: obtaining and reviewing outside history, ordering and reviewing medications, tests or procedures, care coordination (communications with other health care professionals or caregivers) and documentation in the medical record.

## 2022-01-07 NOTE — Progress Notes (Signed)
Per pharmacy patient will not be able to be treated today due to insurance authorization. Patient will call for her next appointment. She is not available on Wednesday as scheduled. Patient de-accessed and discharged from infusion.   Patient states her insurance will be changing again next month.

## 2022-01-07 NOTE — Progress Notes (Signed)
Diane Miranda is here today for follow up post radiation to the breast.   Breast Side:right   They completed their radiation on: 11/29/2021   Does the patient complain of any of the following: Post radiation skin issues: denies Breast Tenderness: tender to touch Breast Swelling: denies Lymphadema: denies Range of Motion limitations: has full range of motion Fatigue post radiation: denies Appetite good/fair/poor: good  Additional comments if applicable: none  Vitals:   01/07/22 1028  BP: 129/84  Pulse: 81  Resp: 20  Temp: 97.6 F (36.4 C)  SpO2: 100%  Weight: 232 lb 3.2 oz (105.3 kg)  Height: 5\' 4"  (1.626 m)

## 2022-01-09 ENCOUNTER — Ambulatory Visit: Payer: Managed Care, Other (non HMO)

## 2022-01-14 ENCOUNTER — Encounter: Payer: Self-pay | Admitting: *Deleted

## 2022-01-15 ENCOUNTER — Ambulatory Visit (HOSPITAL_COMMUNITY)
Admission: RE | Admit: 2022-01-15 | Discharge: 2022-01-15 | Disposition: A | Discharge status: Home / Self Care | Payer: Commercial Managed Care - HMO | Source: Ambulatory Visit | Attending: Hematology and Oncology | Admitting: Hematology and Oncology

## 2022-01-15 ENCOUNTER — Other Ambulatory Visit: Payer: Self-pay

## 2022-01-15 ENCOUNTER — Other Ambulatory Visit: Payer: Self-pay | Admitting: Hematology and Oncology

## 2022-01-15 DIAGNOSIS — C50411 Malignant neoplasm of upper-outer quadrant of right female breast: Secondary | ICD-10-CM | POA: Diagnosis not present

## 2022-01-15 DIAGNOSIS — Z170 Estrogen receptor positive status [ER+]: Secondary | ICD-10-CM | POA: Diagnosis present

## 2022-01-15 MED ORDER — GADOBUTROL 1 MMOL/ML IV SOLN
10.0000 mL | Freq: Once | INTRAVENOUS | Status: AC | PRN
  Administered 2022-01-15: 10 mL via INTRAVENOUS

## 2022-01-16 ENCOUNTER — Other Ambulatory Visit: Payer: Self-pay | Admitting: Hematology and Oncology

## 2022-01-16 ENCOUNTER — Other Ambulatory Visit: Payer: Self-pay | Admitting: Oncology

## 2022-01-16 DIAGNOSIS — K862 Cyst of pancreas: Secondary | ICD-10-CM

## 2022-01-16 NOTE — Progress Notes (Signed)
MRI abdomen pancreas protocol ordered for August 2022 as recommended by radiology

## 2022-01-29 ENCOUNTER — Other Ambulatory Visit: Payer: Self-pay | Admitting: Hematology and Oncology

## 2022-01-29 ENCOUNTER — Inpatient Hospital Stay: Payer: Commercial Managed Care - HMO

## 2022-01-29 ENCOUNTER — Other Ambulatory Visit: Payer: Self-pay

## 2022-01-29 ENCOUNTER — Inpatient Hospital Stay: Payer: Commercial Managed Care - HMO | Attending: Oncology

## 2022-01-29 VITALS — BP 149/88 | HR 86 | Temp 98.7°F | Resp 19 | Wt 230.2 lb

## 2022-01-29 DIAGNOSIS — C50411 Malignant neoplasm of upper-outer quadrant of right female breast: Secondary | ICD-10-CM

## 2022-01-29 DIAGNOSIS — Z170 Estrogen receptor positive status [ER+]: Secondary | ICD-10-CM

## 2022-01-29 DIAGNOSIS — Z79899 Other long term (current) drug therapy: Secondary | ICD-10-CM | POA: Insufficient documentation

## 2022-01-29 DIAGNOSIS — Z95828 Presence of other vascular implants and grafts: Secondary | ICD-10-CM

## 2022-01-29 DIAGNOSIS — Z5111 Encounter for antineoplastic chemotherapy: Secondary | ICD-10-CM | POA: Insufficient documentation

## 2022-01-29 DIAGNOSIS — C773 Secondary and unspecified malignant neoplasm of axilla and upper limb lymph nodes: Secondary | ICD-10-CM | POA: Diagnosis not present

## 2022-01-29 LAB — CMP (CANCER CENTER ONLY)
ALT: 14 U/L (ref 0–44)
AST: 14 U/L — ABNORMAL LOW (ref 15–41)
Albumin: 4 g/dL (ref 3.5–5.0)
Alkaline Phosphatase: 60 U/L (ref 38–126)
Anion gap: 6 (ref 5–15)
BUN: 9 mg/dL (ref 6–20)
CO2: 25 mmol/L (ref 22–32)
Calcium: 8.8 mg/dL — ABNORMAL LOW (ref 8.9–10.3)
Chloride: 107 mmol/L (ref 98–111)
Creatinine: 0.85 mg/dL (ref 0.44–1.00)
GFR, Estimated: >60 mL/min (ref >60)
Glucose, Bld: 106 mg/dL — ABNORMAL HIGH (ref 70–99)
Potassium: 3.9 mmol/L (ref 3.5–5.1)
Sodium: 138 mmol/L (ref 135–145)
Total Bilirubin: 0.4 mg/dL (ref 0.3–1.2)
Total Protein: 6.9 g/dL (ref 6.5–8.1)

## 2022-01-29 LAB — CBC WITH DIFFERENTIAL/PLATELET
Abs Immature Granulocytes: 0.01 10*3/uL (ref 0.00–0.07)
Basophils Absolute: 0.1 10*3/uL (ref 0.0–0.1)
Basophils Relative: 1 %
Eosinophils Absolute: 0.2 10*3/uL (ref 0.0–0.5)
Eosinophils Relative: 3 %
HCT: 35.4 % — ABNORMAL LOW (ref 36.0–46.0)
Hemoglobin: 12.2 g/dL (ref 12.0–15.0)
Immature Granulocytes: 0 %
Lymphocytes Relative: 17 %
Lymphs Abs: 1.2 10*3/uL (ref 0.7–4.0)
MCH: 32.4 pg (ref 26.0–34.0)
MCHC: 34.5 g/dL (ref 30.0–36.0)
MCV: 94.1 fL (ref 80.0–100.0)
Monocytes Absolute: 0.4 10*3/uL (ref 0.1–1.0)
Monocytes Relative: 7 %
Neutro Abs: 4.8 10*3/uL (ref 1.7–7.7)
Neutrophils Relative %: 72 %
Platelets: 248 10*3/uL (ref 150–400)
RBC: 3.76 MIL/uL — ABNORMAL LOW (ref 3.87–5.11)
RDW: 15.3 % (ref 11.5–15.5)
WBC: 6.7 10*3/uL (ref 4.0–10.5)
nRBC: 0 % (ref 0.0–0.2)

## 2022-01-29 MED ORDER — ACETAMINOPHEN 325 MG PO TABS
650.0000 mg | ORAL_TABLET | Freq: Once | ORAL | Status: AC
  Administered 2022-01-29: 650 mg via ORAL
  Filled 2022-01-29: qty 2

## 2022-01-29 MED ORDER — SODIUM CHLORIDE 0.9% FLUSH: 10.0000 mL | INTRAVENOUS | Status: DC | PRN

## 2022-01-29 MED ORDER — TRASTUZUMAB-ANNS CHEMO 150 MG IV SOLR
8.0000 mg/kg | Freq: Once | INTRAVENOUS | Status: AC
  Administered 2022-01-29: 819 mg via INTRAVENOUS
  Filled 2022-01-29: qty 39

## 2022-01-29 MED ORDER — DIPHENHYDRAMINE HCL 25 MG PO CAPS
25.0000 mg | ORAL_CAPSULE | Freq: Once | ORAL | Status: AC
  Administered 2022-01-29: 25 mg via ORAL

## 2022-01-29 MED ORDER — SODIUM CHLORIDE 0.9% FLUSH
10.0000 mL | INTRAVENOUS | Status: DC | PRN
  Administered 2022-01-29: 10 mL via INTRAVENOUS

## 2022-01-29 MED ORDER — SODIUM CHLORIDE 0.9 % IV SOLN: Freq: Once | INTRAVENOUS | Status: AC

## 2022-01-29 MED ORDER — HEPARIN SOD (PORK) LOCK FLUSH 100 UNIT/ML IV SOLN: 500.0000 [IU] | Freq: Once | INTRAVENOUS | Status: DC | PRN

## 2022-01-29 MED ORDER — SODIUM CHLORIDE 0.9 % IV SOLN
840.0000 mg | Freq: Once | INTRAVENOUS | Status: AC
  Administered 2022-01-29: 840 mg via INTRAVENOUS
  Filled 2022-01-29: qty 28

## 2022-01-29 NOTE — Progress Notes (Signed)
Pt declined staying her 30 minutes monitoring period post infusion. No complaints of or signs of distress at the time of discharge.

## 2022-01-29 NOTE — Patient Instructions (Signed)
[facility] ONCOLOGY  Discharge Instructions: Thank you for choosing [facility] to provide your oncology and hematology care.   If you have a lab appointment with the [facility], please go directly to the [facility] and check in at the registration area.   Wear comfortable clothing and clothing appropriate for easy access to any Portacath or PICC line.   We strive to give you quality time with your provider. You may need to reschedule your appointment if you arrive late (15 or more minutes).  Arriving late affects you and other patients whose appointments are after yours.  Also, if you miss three or more appointments without notifying the office, you may be dismissed from the clinic at the providers discretion.      For prescription refill requests, have your pharmacy contact our office and allow 72 hours for refills to be completed.    Today you received the following chemotherapy and/or immunotherapy agents: trastuzumab/pertuzumab      To help prevent nausea and vomiting after your treatment, we encourage you to take your nausea medication as directed.  BELOW ARE SYMPTOMS THAT SHOULD BE REPORTED IMMEDIATELY: *FEVER GREATER THAN 100.4 F (38 C) OR HIGHER *CHILLS OR SWEATING *NAUSEA AND VOMITING THAT IS NOT CONTROLLED WITH YOUR NAUSEA MEDICATION *UNUSUAL SHORTNESS OF BREATH *UNUSUAL BRUISING OR BLEEDING *URINARY PROBLEMS (pain or burning when urinating, or frequent urination) *BOWEL PROBLEMS (unusual diarrhea, constipation, pain near the anus) TENDERNESS IN MOUTH AND THROAT WITH OR WITHOUT PRESENCE OF ULCERS (sore throat, sores in mouth, or a toothache) UNUSUAL RASH, SWELLING OR PAIN  UNUSUAL VAGINAL DISCHARGE OR ITCHING   Items with * indicate a potential emergency and should be followed up as soon as possible or go to the Emergency Department if any problems should occur.  Please show the CHEMOTHERAPY ALERT CARD or IMMUNOTHERAPY ALERT CARD at  check-in to the Emergency Department and triage nurse.  Should you have questions after your visit or need to cancel or reschedule your appointment, please contact [facility]  Dept: [phone]  and follow the prompts.  Office hours are 8:00 a.m. to 4:30 p.m. Monday - Friday. Please note that voicemails left after 4:00 p.m. may not be returned until the following business day.  We are closed weekends and major holidays. You have access to a nurse at all times for urgent questions. Please call the main number to the clinic Dept: [phone] and follow the prompts.   For any non-urgent questions, you may also contact your provider using MyChart. We now offer e-Visits for anyone 45 and older to request care online for non-urgent symptoms. For details visit [email or web address].   Also download the MyChart app! Go to the app store, search "MyChart", open the app, select [facility], and log in with your MyChart username and password.  Due to Covid, a mask is required upon entering the hospital/clinic. If you do not have a mask, one will be given to you upon arrival. For doctor visits, patients may have 1 support person aged 45 or older with them. For treatment visits, patients cannot have anyone with them due to current Covid guidelines and our immunocompromised population.

## 2022-01-30 ENCOUNTER — Encounter: Payer: Self-pay | Admitting: *Deleted

## 2022-02-07 ENCOUNTER — Telehealth: Payer: Self-pay

## 2022-02-07 NOTE — Telephone Encounter (Signed)
Patient notified of completion of [employer] Form. Fax transmission confirmation received and copy of forms placed for pick-up behind Registration Desk as requested.

## 2022-02-19 ENCOUNTER — Encounter: Payer: Self-pay | Admitting: Adult Health

## 2022-02-19 ENCOUNTER — Inpatient Hospital Stay: Payer: Commercial Managed Care - HMO

## 2022-02-19 ENCOUNTER — Other Ambulatory Visit: Payer: Self-pay

## 2022-02-19 ENCOUNTER — Inpatient Hospital Stay (HOSPITAL_BASED_OUTPATIENT_CLINIC_OR_DEPARTMENT_OTHER): Payer: Commercial Managed Care - HMO | Admitting: Adult Health

## 2022-02-19 ENCOUNTER — Inpatient Hospital Stay: Payer: Commercial Managed Care - HMO | Attending: Oncology

## 2022-02-19 ENCOUNTER — Encounter: Payer: Self-pay | Admitting: Hematology and Oncology

## 2022-02-19 ENCOUNTER — Other Ambulatory Visit: Payer: Self-pay | Admitting: Hematology and Oncology

## 2022-02-19 VITALS — BP 146/87 | HR 90 | Temp 98.1°F | Resp 16 | Ht 64.0 in | Wt 234.2 lb

## 2022-02-19 DIAGNOSIS — Z95828 Presence of other vascular implants and grafts: Secondary | ICD-10-CM

## 2022-02-19 DIAGNOSIS — C50411 Malignant neoplasm of upper-outer quadrant of right female breast: Secondary | ICD-10-CM | POA: Diagnosis not present

## 2022-02-19 DIAGNOSIS — Z9221 Personal history of antineoplastic chemotherapy: Secondary | ICD-10-CM | POA: Insufficient documentation

## 2022-02-19 DIAGNOSIS — Z79899 Other long term (current) drug therapy: Secondary | ICD-10-CM | POA: Diagnosis not present

## 2022-02-19 DIAGNOSIS — Z170 Estrogen receptor positive status [ER+]: Secondary | ICD-10-CM | POA: Diagnosis not present

## 2022-02-19 DIAGNOSIS — Z5112 Encounter for antineoplastic immunotherapy: Secondary | ICD-10-CM | POA: Insufficient documentation

## 2022-02-19 LAB — COMPREHENSIVE METABOLIC PANEL
ALT: 10 U/L (ref 0–44)
AST: 10 U/L — ABNORMAL LOW (ref 15–41)
Albumin: 3.9 g/dL (ref 3.5–5.0)
Alkaline Phosphatase: 55 U/L (ref 38–126)
Anion gap: 5 (ref 5–15)
BUN: 7 mg/dL (ref 6–20)
CO2: 27 mmol/L (ref 22–32)
Calcium: 8.8 mg/dL — ABNORMAL LOW (ref 8.9–10.3)
Chloride: 109 mmol/L (ref 98–111)
Creatinine, Ser: 0.82 mg/dL (ref 0.44–1.00)
GFR, Estimated: >60 mL/min (ref >60)
Glucose, Bld: 103 mg/dL — ABNORMAL HIGH (ref 70–99)
Potassium: 3.8 mmol/L (ref 3.5–5.1)
Sodium: 141 mmol/L (ref 135–145)
Total Bilirubin: 0.3 mg/dL (ref 0.3–1.2)
Total Protein: 6.7 g/dL (ref 6.5–8.1)

## 2022-02-19 LAB — CBC WITH DIFFERENTIAL/PLATELET
Abs Immature Granulocytes: 0.01 10*3/uL (ref 0.00–0.07)
Basophils Absolute: 0 10*3/uL (ref 0.0–0.1)
Basophils Relative: 1 %
Eosinophils Absolute: 0.2 10*3/uL (ref 0.0–0.5)
Eosinophils Relative: 3 %
HCT: 35.9 % — ABNORMAL LOW (ref 36.0–46.0)
Hemoglobin: 12.3 g/dL (ref 12.0–15.0)
Immature Granulocytes: 0 %
Lymphocytes Relative: 20 %
Lymphs Abs: 1.3 10*3/uL (ref 0.7–4.0)
MCH: 32.2 pg (ref 26.0–34.0)
MCHC: 34.3 g/dL (ref 30.0–36.0)
MCV: 94 fL (ref 80.0–100.0)
Monocytes Absolute: 0.4 10*3/uL (ref 0.1–1.0)
Monocytes Relative: 5 %
Neutro Abs: 4.6 10*3/uL (ref 1.7–7.7)
Neutrophils Relative %: 71 %
Platelets: 242 10*3/uL (ref 150–400)
RBC: 3.82 MIL/uL — ABNORMAL LOW (ref 3.87–5.11)
RDW: 14.2 % (ref 11.5–15.5)
WBC: 6.5 10*3/uL (ref 4.0–10.5)
nRBC: 0 % (ref 0.0–0.2)

## 2022-02-19 MED ORDER — DIPHENHYDRAMINE HCL 25 MG PO CAPS
25.0000 mg | ORAL_CAPSULE | Freq: Once | ORAL | Status: AC
  Administered 2022-02-19: 25 mg via ORAL
  Filled 2022-02-19: qty 1

## 2022-02-19 MED ORDER — SODIUM CHLORIDE 0.9 % IV SOLN
420.0000 mg | Freq: Once | INTRAVENOUS | Status: AC
  Administered 2022-02-19: 420 mg via INTRAVENOUS
  Filled 2022-02-19: qty 14

## 2022-02-19 MED ORDER — SODIUM CHLORIDE 0.9% FLUSH
10.0000 mL | Freq: Once | INTRAVENOUS | Status: AC
  Administered 2022-02-19: 10 mL

## 2022-02-19 MED ORDER — ACETAMINOPHEN 325 MG PO TABS
650.0000 mg | ORAL_TABLET | Freq: Once | ORAL | Status: AC
  Administered 2022-02-19: 650 mg via ORAL
  Filled 2022-02-19: qty 2

## 2022-02-19 MED ORDER — SODIUM CHLORIDE 0.9% FLUSH
10.0000 mL | INTRAVENOUS | Status: DC | PRN
  Administered 2022-02-19: 10 mL

## 2022-02-19 MED ORDER — TRASTUZUMAB-ANNS CHEMO 150 MG IV SOLR
6.0000 mg/kg | Freq: Once | INTRAVENOUS | Status: AC
  Administered 2022-02-19: 609 mg via INTRAVENOUS
  Filled 2022-02-19: qty 29

## 2022-02-19 MED ORDER — SODIUM CHLORIDE 0.9 % IV SOLN: Freq: Once | INTRAVENOUS | Status: AC

## 2022-02-19 MED ORDER — HEPARIN SOD (PORK) LOCK FLUSH 100 UNIT/ML IV SOLN
500.0000 [IU] | Freq: Once | INTRAVENOUS | Status: AC | PRN
  Administered 2022-02-19: 500 [IU]

## 2022-02-19 NOTE — Progress Notes (Signed)
[facility] Follow up:    Diane Docker, MD [address] [place] [address]   DIAGNOSIS:  Cancer Staging  Malignant neoplasm of upper-outer quadrant of right breast in female, estrogen receptor positive ([facility]) Staging form: Breast, AJCC 8th Edition - Clinical: Stage IIB (cT3, cN1, cM0, G3, ER+, PR+, HER2+) - Signed by Chauncey Cruel, MD on 03/07/2021 Histologic grading system: 3 grade system   SUMMARY OF ONCOLOGIC HISTORY:  46 y.o. Diane Miranda woman status post right breast upper outer quadrant biopsy 02/23/2021 for a clinical T3 N1, stage IIB invasive ductal carcinoma, grade 3, triple positive, with an MIB-1 of 35%             (a) right periareolar biopsy 02/23/2021 was benign/concordant             (b) left breast nodule biopsy 03/14/2021 benign             (c) Punch biopsy of the right areolar area 03/22/2021 no malignancy             (d) CT of the chest abdomen and pelvis 03/20/2021 shows no evidence of metastatic disease; there is 2.5 cm cystic pancreatic mass and a 0.4 cm right liver lobe lesion requiring further follow-up             (e) bone scan 03/25/2021 shows no bone metastases   (1) genetics testing 03/26/2021 through the [employer]-Cancer+RNAinsight panel offered by [facility] found no deleterious mutations in APC, ATM, AXIN2, BARD1, BMPR1A, BRCA1, BRCA2, BRIP1, CDH1, CDK4, CDKN2A, CHEK2, DICER1, EPCAM, GREM1, HOXB13, MEN1, MLH1, MSH2, MSH3, MSH6, MUTYH, NBN, NF1, NF2, NTHL1, PALB2, PMS2, POLD1, POLE, PTEN, RAD51C, RAD51D, RECQL, RET, SDHA, SDHAF2, SDHB, SDHC, SDHD, SMAD4, SMARCA4, STK11, TP53, TSC1, TSC2, and VHL.  RNA data is routinely analyzed for use in variant interpretation for all genes.   (2) neoadjuvant chemotherapy consisting of carboplatin, docetaxel, trastuzumab and pertuzumab every 21 days x 6 beginning 03/28/2021, completed 07/10/2021   (3) trastuzumab and pertuzumab to continue to total 1 year             (a)  echo 03/13/2021 showed an ejection fraction in the 60- 65% range             (b) echo 06/27/2021 showed an ejection fraction in the 65-70%             (c) echo 09/25/2021 shows an ejection fraction in the 60-65% range.   (4) right lumpectomy and sentinel lymph node sampling 08/29/2021 showed no residual invasive disease in the breast or lymph nodes, rare foci of residual DCIS the largest measuring 0.1 cm, with negative margins:ypT(is) ypN0             (a) 5 right  axillary lymph nodes were removed   (5) adjuvant radiation completed 11/27/2021   (6) antiestrogens recommended.  CURRENT THERAPY: Herceptin and Perjeta  INTERVAL HISTORY: Diane Miranda 46 y.o. female returns for evaluation prior to Herceptin/Perjeta.  She is tolerating it well with mild diarrhea following the treatment, that resolves after one day.  Her most recent echo  was completed on 12/31/2021 and showed an EF of 60-65% and normal global longitudinal strain.  She says that her mammogram was ordered and she has not yet scheduled it.  She discussed tamoxifen with Dr. Chryl Heck, and has opted against taking the medciation.    Patient Active Problem List   Diagnosis Date Noted   Port-A-Cath in place 09/11/2021  History of COVID-19    Genetic testing 03/27/2021   Family history of uterine cancer 03/08/2021   Family history of lung cancer 03/08/2021   Malignant neoplasm of upper-outer quadrant of right breast in female, estrogen receptor positive ([facility]) 03/01/2021   SINUSITIS - ACUTE-NOS 09/07/2009   EXUDATIVE PHARYNGITIS 09/07/2009   LOW HDL 08/03/2008   HSV 08/01/2008   OBESITY 08/01/2008   ALLERGIC RHINITIS 08/01/2008   VAGINITIS 08/01/2008   HEADACHE 08/01/2008    is allergic to hydrochlorothiazide.  MEDICAL HISTORY: Past Medical History:  Diagnosis Date   Allergy 2000   Asthma    as child   Breast cancer ([facility])    Family history of lung cancer 03/08/2021   Family history of uterine cancer 03/08/2021    GERD (gastroesophageal reflux disease)    Headache    History of COVID-19    History of radiation therapy    Right breast- 10/09/21-11/29/21- Dr. Gery Pray   Hyperlipidemia    Hypertension    Pre-diabetes    Prediabetes    right breast ca dx'd 02/2021   Trimalleolar fracture of ankle, closed, left, initial encounter     SURGICAL HISTORY: Past Surgical History:  Procedure Laterality Date   BREAST BIOPSY Right 03/22/2021   Procedure: PUNCH BIOPSY OF RIGHT AREOLA;  Surgeon: Stark Klein, MD;  Location: [facility];  Service: General;  Laterality: Right;   BREAST LUMPECTOMY WITH RADIOACTIVE SEED AND SENTINEL LYMPH NODE BIOPSY Right 08/29/2021   Procedure: RIGHT BREAST LUMPECTOMY WITH RADIOACTIVE SEED AND RIGHT AXILLARY SENTINEL LYMPH NODE BIOPSY;  Surgeon: Stark Klein, MD;  Location: [facility];  Service: General;  Laterality: Right;   FRACTURE SURGERY  2018   NO PAST SURGERIES     ORIF ANKLE FRACTURE Left 07/10/2017   Procedure: OPEN REDUCTION INTERNAL FIXATION (ORIF) trimallolar ANKLE FRACTURE;  Surgeon: Wylene Simmer, MD;  Location: [facility];  Service: Orthopedics;  Laterality: Left;   PORTACATH PLACEMENT Left 03/22/2021   Procedure: INSERTION PORT-A-CATH;  Surgeon: Stark Klein, MD;  Location: [facility];  Service: General;  Laterality: Left;   RADIOACTIVE SEED GUIDED AXILLARY SENTINEL LYMPH NODE Right 08/29/2021   Procedure: RADIOACTIVE SEED GUIDED AXILLARY SENTINEL LYMPH NODE EXCISION;  Surgeon: Stark Klein, MD;  Location: [facility];  Service: General;  Laterality: Right;    SOCIAL HISTORY: Social History   Socioeconomic History   Marital status: Single    Spouse name: Not on file   Number of children: 3   Years of education: Not on file   Highest education level: Not on file  Occupational History   Not on file  Tobacco Use   Smoking status: Never   Smokeless tobacco: Never  Vaping Use   Vaping Use: Never used  Substance and Sexual  Activity   Alcohol use: Yes    Comment: social   Drug use: No   Sexual activity: Yes    Birth control/protection: I.U.D.  Other Topics Concern   Not on file  Social History Narrative   Not on file   Social Determinants of Health   Financial Resource Strain: Not on file  Food Insecurity: Not on file  Transportation Needs: Not on file  Physical Activity: Not on file  Stress: Not on file  Social Connections: Not on file  Intimate Partner Violence: Not on file    FAMILY HISTORY: Family History  Problem Relation Age of Onset   Early death Mother    Heart disease Mother  Hypertension Mother    Diabetes Father    Hypertension Father    Diabetes Brother    Hearing loss Maternal Grandmother    Hypertension Maternal Grandmother    Uterine cancer Other 19       MGM's sister   Throat cancer Other        MGM's brother; dx after 25   Lung cancer Other        PGM's sister; dx 3s    Review of Systems  Constitutional:  Negative for appetite change, chills, fatigue, fever and unexpected weight change.  HENT:   Negative for hearing loss, lump/mass and trouble swallowing.   Eyes:  Negative for eye problems and icterus.  Respiratory:  Negative for chest tightness, cough and shortness of breath.   Cardiovascular:  Negative for chest pain, leg swelling and palpitations.  Gastrointestinal:  Negative for abdominal distention, abdominal pain, constipation, diarrhea, nausea and vomiting.  Endocrine: Negative for hot flashes.  Genitourinary:  Negative for difficulty urinating.   Musculoskeletal:  Negative for arthralgias.  Skin:  Negative for itching and rash.  Neurological:  Negative for dizziness, extremity weakness, headaches and numbness.  Hematological:  Negative for adenopathy. Does not bruise/bleed easily.  Psychiatric/Behavioral:  Negative for depression. The patient is not nervous/anxious.      PHYSICAL EXAMINATION  ECOG PERFORMANCE STATUS: 1 - Symptomatic but completely  ambulatory  Vitals:   02/19/22 1244  BP: (!) 146/87  Pulse: 90  Resp: 16  Temp: 98.1 F (36.7 C)  SpO2: 99%    Physical Exam Constitutional:      General: She is not in acute distress.    Appearance: Normal appearance. She is not toxic-appearing.  HENT:     Head: Normocephalic and atraumatic.  Eyes:     General: No scleral icterus. Cardiovascular:     Rate and Rhythm: Normal rate and regular rhythm.     Pulses: Normal pulses.     Heart sounds: Normal heart sounds.  Pulmonary:     Effort: Pulmonary effort is normal.     Breath sounds: Normal breath sounds.  Abdominal:     General: Abdomen is flat. Bowel sounds are normal. There is no distension.     Palpations: Abdomen is soft.     Tenderness: There is no abdominal tenderness.  Musculoskeletal:        General: No swelling.     Cervical back: Neck supple.  Lymphadenopathy:     Cervical: No cervical adenopathy.  Skin:    General: Skin is warm and dry.     Findings: No rash.  Neurological:     General: No focal deficit present.     Mental Status: She is alert.  Psychiatric:        Mood and Affect: Mood normal.        Behavior: Behavior normal.    LABORATORY DATA:  CBC    Component Value Date/Time   WBC 6.5 02/19/2022 1231   RBC 3.82 (L) 02/19/2022 1231   HGB 12.3 02/19/2022 1231   HGB 13.4 03/07/2021 1219   HCT 35.9 (L) 02/19/2022 1231   PLT 242 02/19/2022 1231   PLT 330 03/07/2021 1219   MCV 94.0 02/19/2022 1231   MCH 32.2 02/19/2022 1231   MCHC 34.3 02/19/2022 1231   RDW 14.2 02/19/2022 1231   LYMPHSABS 1.3 02/19/2022 1231   MONOABS 0.4 02/19/2022 1231   EOSABS 0.2 02/19/2022 1231   BASOSABS 0.0 02/19/2022 1231    CMP     Component  Value Date/Time   NA 141 02/19/2022 1231   K 3.8 02/19/2022 1231   CL 109 02/19/2022 1231   CO2 27 02/19/2022 1231   GLUCOSE 103 (H) 02/19/2022 1231   GLUCOSE 105 (H) 11/12/2006 1022   BUN 7 02/19/2022 1231   CREATININE 0.82 02/19/2022 1231   CREATININE 0.85  01/29/2022 1354   CALCIUM 8.8 (L) 02/19/2022 1231   PROT 6.7 02/19/2022 1231   ALBUMIN 3.9 02/19/2022 1231   AST 10 (L) 02/19/2022 1231   AST 14 (L) 01/29/2022 1354   ALT 10 02/19/2022 1231   ALT 14 01/29/2022 1354   ALKPHOS 55 02/19/2022 1231   BILITOT 0.3 02/19/2022 1231   BILITOT 0.4 01/29/2022 1354   GFRNONAA >60 02/19/2022 1231   GFRNONAA >60 01/29/2022 1354   GFRAA >90 01/30/2015 1319     ASSESSMENT and THERAPY PLAN:   Malignant neoplasm of upper-outer quadrant of right breast in female, estrogen receptor positive ([facility]) Diane Miranda is a 46 year old woman with stage IIb estrogen negative HER2 positive breast cancer, status post neoadjuvant chemotherapy, right lumpectomy, adjuvant radiation, currently on maintenance Herceptin and Perjeta, and has declined antiestrogen therapy.  1.  Stage IIb breast cancer: She will continue on Herceptin and Perjeta.  She is tolerating it well.  She is due for echo next month.  I placed orders for this today.  We discussed the antiestrogen therapy.  She has decided to forego taking antiestrogen therapy and understands the risks of breast cancer recurrence associated with this decision.  2.  Health maintenance: We discussed healthy diet and exercise as additional risk mitigation strategies to lower her risk of recurrence.  Diane Miranda will finish up her treatmen on March 28.  On that day, she will also have a survivorship care plan visit.   All questions were answered. The patient knows to call the clinic with any problems, questions or concerns. We can certainly see the patient much sooner if necessary.  Total encounter time: 20 minutes in face-to-face visit time, chart review, lab review, care coordination, order entry, and documentation of the encounter.  Wilber Bihari, NP 02/20/22 9:20 PM Medical Oncology and Hematology [facility] [place], [place] [address] Tel. [phone]    Fax. [phone]  *Total Encounter  Time as defined by the Centers for Medicare and Medicaid Services includes, in addition to the face-to-face time of a patient visit (documented in the note above) non-face-to-face time: obtaining and reviewing outside history, ordering and reviewing medications, tests or procedures, care coordination (communications with other health care professionals or caregivers) and documentation in the medical record.

## 2022-02-19 NOTE — Patient Instructions (Signed)
[facility]  Discharge Instructions: ?Thank you for choosing [facility] to provide your oncology and hematology care.  ? ?If you have a lab appointment with the [facility], please go directly to the [facility] and check in at the registration area. ?  ?Wear comfortable clothing and clothing appropriate for easy access to any Portacath or PICC line.  ? ?We strive to give you quality time with your provider. You may need to reschedule your appointment if you arrive late (15 or more minutes).  Arriving late affects you and other patients whose appointments are after yours.  Also, if you miss three or more appointments without notifying the office, you may be dismissed from the clinic at the provider?s discretion.    ?  ?For prescription refill requests, have your pharmacy contact our office and allow 72 hours for refills to be completed.   ? ?Today you received the following chemotherapy and/or immunotherapy agents: Trastuzumab and Pertuzumab    ?  ?To help prevent nausea and vomiting after your treatment, we encourage you to take your nausea medication as directed. ? ?BELOW ARE SYMPTOMS THAT SHOULD BE REPORTED IMMEDIATELY: ?*FEVER GREATER THAN 100.4 F (38 ?C) OR HIGHER ?*CHILLS OR SWEATING ?*NAUSEA AND VOMITING THAT IS NOT CONTROLLED WITH YOUR NAUSEA MEDICATION ?*UNUSUAL SHORTNESS OF BREATH ?*UNUSUAL BRUISING OR BLEEDING ?*URINARY PROBLEMS (pain or burning when urinating, or frequent urination) ?*BOWEL PROBLEMS (unusual diarrhea, constipation, pain near the anus) ?TENDERNESS IN MOUTH AND THROAT WITH OR WITHOUT PRESENCE OF ULCERS (sore throat, sores in mouth, or a toothache) ?UNUSUAL RASH, SWELLING OR PAIN  ?UNUSUAL VAGINAL DISCHARGE OR ITCHING  ? ?Items with * indicate a potential emergency and should be followed up as soon as possible or go to the Emergency Department if any problems should occur. ? ?Please show the CHEMOTHERAPY ALERT CARD or IMMUNOTHERAPY ALERT  CARD at check-in to the Emergency Department and triage nurse. ? ?Should you have questions after your visit or need to cancel or reschedule your appointment, please contact [facility]  Dept: [phone]  and follow the prompts.  Office hours are 8:00 a.m. to 4:30 p.m. Monday - Friday. Please note that voicemails left after 4:00 p.m. may not be returned until the following business day.  We are closed weekends and major holidays. You have access to a nurse at all times for urgent questions. Please call the main number to the clinic Dept: [phone] and follow the prompts. ? ? ?For any non-urgent questions, you may also contact your provider using MyChart. We now offer e-Visits for anyone 25 and older to request care online for non-urgent symptoms. For details visit [email or web address]. ?  ?Also download the MyChart app! Go to the app store, search "MyChart", open the app, select [facility], and log in with your MyChart username and password. ? ?Due to Covid, a mask is required upon entering the hospital/clinic. If you do not have a mask, one will be given to you upon arrival. For doctor visits, patients may have 1 support person aged 54 or older with them. For treatment visits, patients cannot have anyone with them due to current Covid guidelines and our immunocompromised population.  ? ?

## 2022-02-20 ENCOUNTER — Encounter: Payer: Self-pay | Admitting: Hematology and Oncology

## 2022-02-20 NOTE — Assessment & Plan Note (Signed)
Diane Miranda is a 46 year old woman with stage IIb estrogen negative HER2 positive breast cancer, status post neoadjuvant chemotherapy, right lumpectomy, adjuvant radiation, currently on maintenance Herceptin and Perjeta, and has declined antiestrogen therapy. ? ?1.  Stage IIb breast cancer: She will continue on Herceptin and Perjeta.  She is tolerating it well.  She is due for echo next month.  I placed orders for this today.  We discussed the antiestrogen therapy.  She has decided to forego taking antiestrogen therapy and understands the risks of breast cancer recurrence associated with this decision. ? ?2.  Health maintenance: We discussed healthy diet and exercise as additional risk mitigation strategies to lower her risk of recurrence. ? ?Diane Miranda will finish up her treatmen on March 28.  On that day, she will also have a survivorship care plan visit. ?

## 2022-02-21 ENCOUNTER — Telehealth: Payer: Self-pay | Admitting: Adult Health

## 2022-02-21 NOTE — Telephone Encounter (Signed)
Scheduled appointment per 3/7 los. Patient is aware of the changes made to her upcoming appointment. ?

## 2022-03-06 ENCOUNTER — Encounter: Payer: Self-pay | Admitting: Hematology and Oncology

## 2022-03-12 ENCOUNTER — Other Ambulatory Visit: Payer: Self-pay | Admitting: Hematology and Oncology

## 2022-03-12 ENCOUNTER — Other Ambulatory Visit: Payer: Self-pay

## 2022-03-12 ENCOUNTER — Encounter: Payer: Self-pay | Admitting: *Deleted

## 2022-03-12 ENCOUNTER — Ambulatory Visit: Payer: Managed Care, Other (non HMO)

## 2022-03-12 ENCOUNTER — Other Ambulatory Visit: Payer: Managed Care, Other (non HMO)

## 2022-03-12 ENCOUNTER — Inpatient Hospital Stay (HOSPITAL_BASED_OUTPATIENT_CLINIC_OR_DEPARTMENT_OTHER): Payer: Commercial Managed Care - HMO | Admitting: Physician Assistant

## 2022-03-12 ENCOUNTER — Inpatient Hospital Stay: Payer: Commercial Managed Care - HMO

## 2022-03-12 VITALS — BP 146/89 | HR 78 | Temp 97.2°F | Resp 18 | Wt 236.5 lb

## 2022-03-12 VITALS — BP 119/77 | HR 80 | Temp 98.6°F | Resp 18

## 2022-03-12 DIAGNOSIS — Z170 Estrogen receptor positive status [ER+]: Secondary | ICD-10-CM | POA: Diagnosis not present

## 2022-03-12 DIAGNOSIS — C50411 Malignant neoplasm of upper-outer quadrant of right female breast: Secondary | ICD-10-CM

## 2022-03-12 DIAGNOSIS — Z5112 Encounter for antineoplastic immunotherapy: Secondary | ICD-10-CM | POA: Diagnosis not present

## 2022-03-12 DIAGNOSIS — Z95828 Presence of other vascular implants and grafts: Secondary | ICD-10-CM

## 2022-03-12 LAB — CBC WITH DIFFERENTIAL (CANCER CENTER ONLY)
Abs Immature Granulocytes: 0.02 10*3/uL (ref 0.00–0.07)
Basophils Absolute: 0 10*3/uL (ref 0.0–0.1)
Basophils Relative: 1 %
Eosinophils Absolute: 0.2 10*3/uL (ref 0.0–0.5)
Eosinophils Relative: 3 %
HCT: 34.2 % — ABNORMAL LOW (ref 36.0–46.0)
Hemoglobin: 12 g/dL (ref 12.0–15.0)
Immature Granulocytes: 0 %
Lymphocytes Relative: 20 %
Lymphs Abs: 1.3 10*3/uL (ref 0.7–4.0)
MCH: 32.5 pg (ref 26.0–34.0)
MCHC: 35.1 g/dL (ref 30.0–36.0)
MCV: 92.7 fL (ref 80.0–100.0)
Monocytes Absolute: 0.4 10*3/uL (ref 0.1–1.0)
Monocytes Relative: 6 %
Neutro Abs: 4.7 10*3/uL (ref 1.7–7.7)
Neutrophils Relative %: 70 %
Platelet Count: 233 10*3/uL (ref 150–400)
RBC: 3.69 MIL/uL — ABNORMAL LOW (ref 3.87–5.11)
RDW: 13.6 % (ref 11.5–15.5)
WBC Count: 6.6 10*3/uL (ref 4.0–10.5)
nRBC: 0 % (ref 0.0–0.2)

## 2022-03-12 LAB — CMP (CANCER CENTER ONLY)
ALT: 10 U/L (ref 0–44)
AST: 9 U/L — ABNORMAL LOW (ref 15–41)
Albumin: 3.8 g/dL (ref 3.5–5.0)
Alkaline Phosphatase: 55 U/L (ref 38–126)
Anion gap: 4 — ABNORMAL LOW (ref 5–15)
BUN: 8 mg/dL (ref 6–20)
CO2: 28 mmol/L (ref 22–32)
Calcium: 8.7 mg/dL — ABNORMAL LOW (ref 8.9–10.3)
Chloride: 109 mmol/L (ref 98–111)
Creatinine: 0.83 mg/dL (ref 0.44–1.00)
GFR, Estimated: >60 mL/min (ref >60)
Glucose, Bld: 110 mg/dL — ABNORMAL HIGH (ref 70–99)
Potassium: 3.8 mmol/L (ref 3.5–5.1)
Sodium: 141 mmol/L (ref 135–145)
Total Bilirubin: 0.4 mg/dL (ref 0.3–1.2)
Total Protein: 6.3 g/dL — ABNORMAL LOW (ref 6.5–8.1)

## 2022-03-12 MED ORDER — SODIUM CHLORIDE 0.9 % IV SOLN: Freq: Once | INTRAVENOUS | Status: AC

## 2022-03-12 MED ORDER — DIPHENHYDRAMINE HCL 25 MG PO CAPS
25.0000 mg | ORAL_CAPSULE | Freq: Once | ORAL | Status: AC
  Administered 2022-03-12: 25 mg via ORAL
  Filled 2022-03-12: qty 1

## 2022-03-12 MED ORDER — SODIUM CHLORIDE 0.9% FLUSH
10.0000 mL | Freq: Once | INTRAVENOUS | Status: AC
  Administered 2022-03-12: 10 mL

## 2022-03-12 MED ORDER — SODIUM CHLORIDE 0.9 % IV SOLN
420.0000 mg | Freq: Once | INTRAVENOUS | Status: AC
  Administered 2022-03-12: 420 mg via INTRAVENOUS
  Filled 2022-03-12: qty 14

## 2022-03-12 MED ORDER — SODIUM CHLORIDE 0.9% FLUSH
10.0000 mL | INTRAVENOUS | Status: DC | PRN
  Administered 2022-03-12: 10 mL

## 2022-03-12 MED ORDER — TRASTUZUMAB-ANNS CHEMO 150 MG IV SOLR
6.0000 mg/kg | Freq: Once | INTRAVENOUS | Status: AC
  Administered 2022-03-12: 609 mg via INTRAVENOUS
  Filled 2022-03-12: qty 29

## 2022-03-12 MED ORDER — ACETAMINOPHEN 325 MG PO TABS
650.0000 mg | ORAL_TABLET | Freq: Once | ORAL | Status: AC
  Administered 2022-03-12: 650 mg via ORAL
  Filled 2022-03-12: qty 2

## 2022-03-12 MED ORDER — HEPARIN SOD (PORK) LOCK FLUSH 100 UNIT/ML IV SOLN
500.0000 [IU] | Freq: Once | INTRAVENOUS | Status: AC | PRN
  Administered 2022-03-12: 500 [IU]

## 2022-03-12 NOTE — Patient Instructions (Signed)
[facility]COL[facility]  Discharge Instructions: Thank you for choosing [facility] to provide your oncology and hematology care.   If you have a lab appointment with the Cancer Center, please go directly to the Cancer Center and check in at the registration area.   Wear comfortable clothing and clothing appropriate for easy access to any Portacath or PICC line.   We strive to give you quality time with your provider. You may need to reschedule your appointment if you arrive late (15 or more minutes).  Arriving late affects you and other patients whose appointments are after yours.  Also, if you miss three or more appointments without notifying the office, you may be dismissed from the clinic at the provider's discretion.      For prescription refill requests, have your pharmacy contact our office and allow 72 hours for refills to be completed.    Today you received the following chemotherapy and/or immunotherapy agents: Trastuzumab, Pertuzumab.      To help prevent nausea and vomiting after your treatment, we encourage you to take your nausea medication as directed.  BELOW ARE SYMPTOMS THAT SHOULD BE REPORTED IMMEDIATELY: *FEVER GREATER THAN 100.4 F (38 C) OR HIGHER *CHILLS OR SWEATING *NAUSEA AND VOMITING THAT IS NOT CONTROLLED WITH YOUR NAUSEA MEDICATION *UNUSUAL SHORTNESS OF BREATH *UNUSUAL BRUISING OR BLEEDING *URINARY PROBLEMS (pain or burning when urinating, or frequent urination) *BOWEL PROBLEMS (unusual diarrhea, constipation, pain near the anus) TENDERNESS IN MOUTH AND THROAT WITH OR WITHOUT PRESENCE OF ULCERS (sore throat, sores in mouth, or a toothache) UNUSUAL RASH, SWELLING OR PAIN  UNUSUAL VAGINAL DISCHARGE OR ITCHING   Items with * indicate a potential emergency and should be followed up as soon as possible or go to the Emergency Department if any problems should occur.  Please show the CHEMOTHERAPY ALERT CARD or IMMUNOTHERAPY ALERT CARD  at check-in to the Emergency Department and triage nurse.  Should you have questions after your visit or need to cancel or reschedule your appointment, please contact [facility]  Dept: [phone]  and follow the prompts.  Office hours are 8:00 a.m. to 4:30 p.m. Monday - Friday. Please note that voicemails left after 4:00 p.m. may not be returned until the following business day.  We are closed weekends and major holidays. You have access to a nurse at all times for urgent questions. Please call the main number to the clinic Dept: [phone] and follow the prompts.   For any non-urgent questions, you may also contact your provider using MyChart. We now offer e-Visits for anyone 18 and older to request care online for non-urgent symptoms. For details visit [email or web address].   Also download the MyChart app! Go to the app store, search "MyChart", open the app, select [facility], and log in with your MyChart username and password.  Due to Covid, a mask is required upon entering the hospital/clinic. If you do not have a mask, one will be given to you upon arrival. For doctor visits, patients may have 1 support person aged 18 or older with them. For treatment visits, patients cannot have anyone with them due to current Covid guidelines and our immunocompromised population.  

## 2022-03-12 NOTE — Progress Notes (Signed)
Per [place] PA, ok to treat today with Echo from 12/31/21. LEF 60-65%. ?

## 2022-03-13 ENCOUNTER — Telehealth: Payer: Self-pay | Admitting: Adult Health

## 2022-03-13 NOTE — Telephone Encounter (Signed)
error 

## 2022-03-14 ENCOUNTER — Encounter: Payer: Self-pay | Admitting: Hematology and Oncology

## 2022-03-14 NOTE — Progress Notes (Signed)
? ?CLINIC:  ?Survivorship  ? ?REASON FOR VISIT:  ?Routine follow-up visit for a recent history of breast cancer. ? ?BRIEF ONCOLOGIC HISTORY:  ?Oncology History  ?Malignant neoplasm of upper-outer quadrant of right breast in female, estrogen receptor positive (HCC)  ?03/01/2021 Initial Diagnosis  ? Malignant neoplasm of upper-outer quadrant of right breast in female, estrogen receptor positive (HCC) ?  ?03/07/2021 Cancer Staging  ? Staging form: Breast, AJCC 8th Edition ?- Clinical: Stage IIB (cT3, cN1, cM0, G3, ER+, PR+, HER2+) - Signed by Magrinat, Gustav C, MD on 03/07/2021 ?Histologic grading system: 3 grade system ? ?  ?03/26/2021 Genetic Testing  ? Negative hereditary cancer genetic testing: no pathogenic variants detected in [facility] CustomNext-Cancer +RNAinsight Panel.  The report date is March 26, 2021.   ? ?The CustomNext-Cancer+RNAinsight panel offered by [facility] includes sequencing and rearrangement analysis for the following 47 genes:  APC, ATM, AXIN2, BARD1, BMPR1A, BRCA1, BRCA2, BRIP1, CDH1, CDK4, CDKN2A, CHEK2, DICER1, EPCAM, GREM1, HOXB13, MEN1, MLH1, MSH2, MSH3, MSH6, MUTYH, NBN, NF1, NF2, NTHL1, PALB2, PMS2, POLD1, POLE, PTEN, RAD51C, RAD51D, RECQL, RET, SDHA, SDHAF2, SDHB, SDHC, SDHD, SMAD4, SMARCA4, STK11, TP53, TSC1, TSC2, and VHL.  RNA data is routinely analyzed for use in variant interpretation for all genes. ?  ?03/28/2021 - 07/12/2021 Chemotherapy  ?  ? ?  ? ?  ?07/31/2021 -  Chemotherapy  ? Patient is on Treatment Plan : BREAST Trastuzumab + Pertuzumab q21d  ?   ? ? ?INTERVAL HISTORY:  ?Diane Miranda presents to the [facility] today for our initial meeting to review her survivorship care plan detailing her treatment course for breast cancer, as well as monitoring long-term side effects of that treatment, education regarding health maintenance, screening, and overall wellness and health promotion.    ?She is scheduled for a final treatment of Trastuzumab plus Pertuzumab today.   ? ?Overall, Diane Miranda reports that she is doing well with stable energy and appetite levels. She is trying to become more active and continues to complete her daily activities on her own. She denies nausea, vomiting or abdominal pain. She has occasional episodes of diarrhea with her current treatment, up to 2 episodes per week. She takes imodium as needed with relief of diarrhea. She denies easy bruising or signs of bleeding. She reports noticing blurry vision and decreased visual acuity since starting her breast cancer therapy. She is scheduled for a follow up with her ophthalmologist next month. Patient has stable, mild swelling in he right upper extremity since her surgery. She denies fevers, chills, night sweats, shortness of breath, chest pain or cough. She has no other complaints. Rest of the ROS is below.  ? ? ?REVIEW OF SYSTEMS:  ?Review of Systems  ?Constitutional:  Negative for appetite change, fatigue and fever.  ?HENT:   Negative for lump/mass, nosebleeds and sore throat.   ?Eyes:  Positive for eye problems (decreased visual acuity).  ?Respiratory:  Negative for cough and shortness of breath.   ?Cardiovascular:  Negative for chest pain and leg swelling.  ?Gastrointestinal:  Positive for diarrhea. Negative for abdominal pain, blood in stool, constipation, nausea and vomiting.  ?Skin:  Negative for itching and rash.  ?Neurological:  Negative for dizziness, headaches and numbness.  ?Breast: Denies any new nodularity, masses, tenderness, nipple changes, or nipple discharge.  ? ?ONCOLOGY TREATMENT TEAM:  ?1. Surgeon:  Dr. Faera Byerly at [facility] ?2. Medical Oncologist: Dr. Praveena Iruku  ?3. Radiation Oncologist: Dr. James Kinard ?  ? ?PAST MEDICAL/SURGICAL HISTORY:  ?  Past Medical History:  ?Diagnosis Date  ? Allergy 2000  ? Asthma   ? as child  ? Breast cancer (HCC)   ? Family history of lung cancer 03/08/2021  ? Family history of uterine cancer 03/08/2021  ? GERD (gastroesophageal  reflux disease)   ? Headache   ? History of COVID-19   ? History of radiation therapy   ? Right breast- 10/09/21-11/29/21- Dr. James Kinard  ? Hyperlipidemia   ? Hypertension   ? Pre-diabetes   ? Prediabetes   ? right breast ca dx'd 02/2021  ? Trimalleolar fracture of ankle, closed, left, initial encounter   ? ?Past Surgical History:  ?Procedure Laterality Date  ? BREAST BIOPSY Right 03/22/2021  ? Procedure: PUNCH BIOPSY OF RIGHT AREOLA;  Surgeon: Byerly, Faera, MD;  Location: [facility];  Service: General;  Laterality: Right;  ? BREAST LUMPECTOMY WITH RADIOACTIVE SEED AND SENTINEL LYMPH NODE BIOPSY Right 08/29/2021  ? Procedure: RIGHT BREAST LUMPECTOMY WITH RADIOACTIVE SEED AND RIGHT AXILLARY SENTINEL LYMPH NODE BIOPSY;  Surgeon: Byerly, Faera, MD;  Location: [facility];  Service: General;  Laterality: Right;  ? FRACTURE SURGERY  2018  ? NO PAST SURGERIES    ? ORIF ANKLE FRACTURE Left 07/10/2017  ? Procedure: OPEN REDUCTION INTERNAL FIXATION (ORIF) trimallolar ANKLE FRACTURE;  Surgeon: Hewitt, John, MD;  Location: [facility];  Service: Orthopedics;  Laterality: Left;  ? PORTACATH PLACEMENT Left 03/22/2021  ? Procedure: INSERTION PORT-A-CATH;  Surgeon: Byerly, Faera, MD;  Location: [facility];  Service: General;  Laterality: Left;  ? RADIOACTIVE SEED GUIDED AXILLARY SENTINEL LYMPH NODE Right 08/29/2021  ? Procedure: RADIOACTIVE SEED GUIDED AXILLARY SENTINEL LYMPH NODE EXCISION;  Surgeon: Byerly, Faera, MD;  Location: [facility];  Service: General;  Laterality: Right;  ? ? ? ?ALLERGIES:  ?Allergies  ?Allergen Reactions  ? Hydrochlorothiazide Anaphylaxis and Swelling  ? ? ? ?CURRENT MEDICATIONS:  ?Outpatient Encounter Medications as of 03/12/2022  ?Medication Sig  ? acetaminophen (TYLENOL) 500 MG tablet Take 1 tablet (500 mg total) by mouth 3 (three) times daily with meals as needed. Take with aleve 220 mg tablet  ? Fexofenadine HCl (ALLEGRA ALLERGY PO) Take 1 tablet by mouth daily.  ?  gabapentin (NEURONTIN) 300 MG capsule Take 1 capsule (300 mg total) by mouth at bedtime.  ? lidocaine-prilocaine (EMLA) cream Apply 1 application topically as needed (Port).  ? loratadine (CLARITIN) 10 MG tablet Take 1 tablet (10 mg total) by mouth daily.  ? losartan (COZAAR) 50 MG tablet Take 1 tablet (50 mg total) by mouth every evening.  ? metoprolol succinate (TOPROL-XL) 100 MG 24 hr tablet TAKE 1 TABLET BY MOUTH EVERY DAY IN THE MORNING  ? Multiple Vitamins-Minerals (MULTIVITAMIN WITH MINERALS) tablet Take 1 tablet by mouth daily.  ? rosuvastatin (CRESTOR) 10 MG tablet Take 10 mg by mouth daily.  ? ?Facility-Administered Encounter Medications as of 03/12/2022  ?Medication  ? acetaminophen (TYLENOL) tablet 650 mg  ? diphenhydrAMINE (BENADRYL) capsule 25 mg  ? heparin lock flush 100 unit/mL  ? sodium chloride flush (NS) 0.9 % injection 10 mL  ? ? ? ?ONCOLOGIC FAMILY HISTORY:  ?Family History  ?Problem Relation Age of Onset  ? Early death Mother   ? Heart disease Mother   ? Hypertension Mother   ? Diabetes Father   ? Hypertension Father   ? Diabetes Brother   ? Hearing loss Maternal Grandmother   ? Hypertension Maternal Grandmother   ? Uterine cancer Other 70  ?       MGM's sister  ? Throat cancer Other   ?     MGM's brother; dx after 50  ? Lung cancer Other   ?     PGM's sister; dx 70s  ? ? ? ?GENETIC COUNSELING/TESTING: ?Genetic testing completed on 03/26/2021 through the CustomNext-Cancer+RNAinsight panel offered by [facility] found no deleterious mutations in APC, ATM, AXIN2, BARD1, BMPR1A, BRCA1, BRCA2, BRIP1, CDH1, CDK4, CDKN2A, CHEK2, DICER1, EPCAM, GREM1, HOXB13, MEN1, MLH1, MSH2, MSH3, MSH6, MUTYH, NBN, NF1, NF2, NTHL1, PALB2, PMS2, POLD1, POLE, PTEN, RAD51C, RAD51D, RECQL, RET, SDHA, SDHAF2, SDHB, SDHC, SDHD, SMAD4, SMARCA4, STK11, TP53, TSC1, TSC2, and VHL.  RNA data is routinely analyzed for use in variant interpretation for all genes. ?  ? ?SOCIAL HISTORY:  ?Social History  ? ?Socioeconomic History   ? Marital status: Single  ?  Spouse name: Not on file  ? Number of children: 3  ? Years of education: Not on file  ? Highest education level: Not on file  ?Occupational History  ? Not on file  ?Tobacco Use  ? Smoking s

## 2022-03-19 ENCOUNTER — Encounter: Payer: Self-pay | Admitting: *Deleted

## 2022-03-19 DIAGNOSIS — Z170 Estrogen receptor positive status [ER+]: Secondary | ICD-10-CM

## 2022-03-19 DIAGNOSIS — C50411 Malignant neoplasm of upper-outer quadrant of right female breast: Secondary | ICD-10-CM

## 2022-03-25 ENCOUNTER — Ambulatory Visit: Payer: Managed Care, Other (non HMO)

## 2022-03-25 ENCOUNTER — Other Ambulatory Visit: Payer: Self-pay | Admitting: *Deleted

## 2022-03-25 DIAGNOSIS — Z170 Estrogen receptor positive status [ER+]: Secondary | ICD-10-CM

## 2022-03-27 ENCOUNTER — Encounter: Payer: Self-pay | Admitting: Physical Therapy

## 2022-03-27 ENCOUNTER — Encounter: Payer: Self-pay | Admitting: Hematology and Oncology

## 2022-03-27 ENCOUNTER — Ambulatory Visit: Payer: No Typology Code available for payment source | Attending: Hematology and Oncology | Admitting: Physical Therapy

## 2022-03-27 DIAGNOSIS — C50411 Malignant neoplasm of upper-outer quadrant of right female breast: Secondary | ICD-10-CM | POA: Insufficient documentation

## 2022-03-27 DIAGNOSIS — Z170 Estrogen receptor positive status [ER+]: Secondary | ICD-10-CM | POA: Diagnosis not present

## 2022-03-27 DIAGNOSIS — R293 Abnormal posture: Secondary | ICD-10-CM | POA: Diagnosis not present

## 2022-03-27 DIAGNOSIS — I890 Lymphedema, not elsewhere classified: Secondary | ICD-10-CM | POA: Insufficient documentation

## 2022-03-27 NOTE — Patient Instructions (Signed)
Self manual lymph drainage: ?Perform this sequence once a day.  Only give enough pressure no your skin to make the skin move. ? ?Diaphragmatic - Supine ? ? ?Inhale through nose making navel move out toward hands. Exhale through puckered lips, hands follow navel in. ?Repeat _5__ times. Rest _10__ seconds between repeats.  ? ?Copyright ? VHI. All rights reserved.  ?Hug yourself.  Do circles at your neck just above your collarbones.  Repeat this 10 times. ? ?Axilla - One at a Time ? ? ?Using full weight of flat hand and fingers at center of uninvolved armpit, make _10__ in-place circles.  ? ?Copyright ? VHI. All rights reserved.  ?LEG: Inguinal Nodes Stimulation ? ? ?With small finger side of hand against hip crease on involved side, gently perform circles at the crease. ?Repeat __10_ times.  ? ?Copyright ? VHI. All rights reserved.  ?Axilla to Inguinal Nodes - Sweep ? ? ?On involved side, stretch _4__ times from armpit along side of trunk to hip crease. ? ?Now gently stretch skin from the involved side to the uninvolved side across the chest at the shoulder line.  Repeat that 4 times. ? ?Draw an imaginary diagonal line from upper outer breast through the nipple area toward lower inner breast.  Direct fluid upward and inward from this line toward the pathway across your upper chest .  Do this in three rows to treat all of the upper inner breast tissue, and do each row 3-4x. ?     Direct fluid to treat all of lower outer breast tissue downward and outward toward   pathway that is aimed at the right groin. ? ?Finish by doing the pathways as described above going from your involved armpit to the same side groin and going across your upper chest from the involved shoulder to the uninvolved shoulder. ? ?Repeat the steps above where you do circles in your right groin and left armpit. ?Copyright ? VHI. All rights reserved.   ?

## 2022-03-27 NOTE — Therapy (Signed)
?OUTPATIENT PHYSICAL THERAPY ONCOLOGY EVALUATION ? ?Patient Name: Diane Miranda ?MRN: [id number] ?DOB:1976-01-12, 46 y.o., female ?Today's Date: 03/27/2022 ? ? PT End of Session - 03/27/22 1614   ? ? Visit Number 1   ? Number of Visits 9   ? Date for PT Re-Evaluation 04/24/22   ? PT Start Time 1512   pt arrived late  ? PT Stop Time 1612   ? PT Time Calculation (min) 60 min   ? Activity Tolerance Patient tolerated treatment well   ? Behavior During Therapy [facility] for tasks assessed/performed   ? ?  ?  ? ?  ? ? ?Past Medical History:  ?Diagnosis Date  ? Allergy 2000  ? Asthma   ? as child  ? Breast cancer ([place])   ? Family history of lung cancer 03/08/2021  ? Family history of uterine cancer 03/08/2021  ? GERD (gastroesophageal reflux disease)   ? Headache   ? History of COVID-19   ? History of radiation therapy   ? Right breast- 10/09/21-11/29/21- Dr. Gery Pray  ? Hyperlipidemia   ? Hypertension   ? Pre-diabetes   ? Prediabetes   ? right breast ca dx'd 02/2021  ? Trimalleolar fracture of ankle, closed, left, initial encounter   ? ?Past Surgical History:  ?Procedure Laterality Date  ? BREAST BIOPSY Right 03/22/2021  ? Procedure: PUNCH BIOPSY OF RIGHT AREOLA;  Surgeon: Stark Klein, MD;  Location: [place];  Service: General;  Laterality: Right;  ? BREAST LUMPECTOMY WITH RADIOACTIVE SEED AND SENTINEL LYMPH NODE BIOPSY Right 08/29/2021  ? Procedure: RIGHT BREAST LUMPECTOMY WITH RADIOACTIVE SEED AND RIGHT AXILLARY SENTINEL LYMPH NODE BIOPSY;  Surgeon: Stark Klein, MD;  Location: [place];  Service: General;  Laterality: Right;  ? FRACTURE SURGERY  2018  ? NO PAST SURGERIES    ? ORIF ANKLE FRACTURE Left 07/10/2017  ? Procedure: OPEN REDUCTION INTERNAL FIXATION (ORIF) trimallolar ANKLE FRACTURE;  Surgeon: Wylene Simmer, MD;  Location: [place];  Service: Orthopedics;  Laterality: Left;  ? PORTACATH PLACEMENT Left 03/22/2021  ? Procedure: INSERTION PORT-A-CATH;  Surgeon: Stark Klein, MD;   Location: [place];  Service: General;  Laterality: Left;  ? RADIOACTIVE SEED GUIDED AXILLARY SENTINEL LYMPH NODE Right 08/29/2021  ? Procedure: RADIOACTIVE SEED GUIDED AXILLARY SENTINEL LYMPH NODE EXCISION;  Surgeon: Stark Klein, MD;  Location: [place];  Service: General;  Laterality: Right;  ? ?Patient Active Problem List  ? Diagnosis Date Noted  ? Port-A-Cath in place 09/11/2021  ? History of COVID-19   ? Genetic testing 03/27/2021  ? Family history of uterine cancer 03/08/2021  ? Family history of lung cancer 03/08/2021  ? Malignant neoplasm of upper-outer quadrant of right breast in female, estrogen receptor positive ([place]) 03/01/2021  ? SINUSITIS - ACUTE-NOS 09/07/2009  ? EXUDATIVE PHARYNGITIS 09/07/2009  ? LOW HDL 08/03/2008  ? 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 OBESITY 08/01/2008  ? ALLERGIC RHINITIS 08/01/2008  ? VAGINITIS 08/01/2008  ? HEADACHE 08/01/2008  ? ? ?PCP: [name], MD ? ?REFERRING PROVIDER: Benay Pike, MD ? ?REFERRING DIAG: C50.411,Z17.0 (ICD-10-CM) - Malignant neoplasm of upper-outer quadrant of right breast in female, estrogen receptor positive ([place])  ? ?THERAPY DIAG:  ?Lymphedema, not elsewhere classified ? ?Abnormal posture ? ?Malignant neoplasm of upper-outer quadrant of right breast in female, estrogen receptor positive ([place]) ? ?ONSET DATE: 03/07/21 ? ?SUBJECTIVE                                                                                                                                                                                          ? ?  SUBJECTIVE STATEMENT: I had swelling in my arm and in my breast and I started doing some stretches and some massage. It seemed to have gotten better. I am going to be flying on Sunday the 16th.  ? ?PERTINENT HISTORY: Diagnosed on 03/07/2021 with R breast cancer stage IIB (ER/PR+, HER2+), underwent a R lumpectomy and SLNB (0/5) on  08/29/21, she completed neoadjuvant chemo, and adjuvant radiation and recently completed chemo, declined  antiestrogen therapy ? ?PAIN:  ?Are you having pain? No ? ?PRECAUTIONS: Other: at risk for lymphedema R UE ? ?WEIGHT BEARING RESTRICTIONS No ? ?FALLS:  ?Has patient fallen in last 6 months? No ? ?LIVING ENVIRONMENT: ?Lives with: lives with their family ?Lives in: House/apartment ?Stairs: No;  ?Has following equipment at home: None ? ?OCCUPATION: Financial services - full time; desk work ? ?LEISURE: pt walks about 3x/wk for 20-30 min ? ?HAND DOMINANCE : right  ? ?PRIOR LEVEL OF FUNCTION: Independent ? ?PATIENT GOALS to check for swelling and check ROM ? ? ?OBJECTIVE ? ?COGNITION: ? Overall cognitive status: Within functional limits for tasks assessed  ? ?PALPATION: Increased fibrosis in inferior R breast and medial inferior breast ? ?OBSERVATIONS / OTHER ASSESSMENTS: fullness noted in R breast ? ? ?POSTURE: rounded shoulders, forward head ? ?UPPER EXTREMITY AROM/PROM: ? ?A/PROM RIGHT  03/27/2022 ?  ?Shoulder extension 68  ?Shoulder flexion 168  ?Shoulder abduction 170  ?Shoulder internal rotation 46  ?Shoulder external rotation 78  ?  (Blank rows = not tested) ? ?A/PROM LEFT  03/27/2022  ?Shoulder extension 62  ?Shoulder flexion 170  ?Shoulder abduction 175  ?Shoulder internal rotation 65  ?Shoulder external rotation 84  ?  (Blank rows = not tested) ? ? ?LYMPHEDEMA ASSESSMENTS:  ? ?SURGERY TYPE/DATE: R lumpectomy and SLNB (0/5) on 08/29/21 ? ?NUMBER OF LYMPH NODES REMOVED: 5 all negative ? ?CHEMOTHERAPY: completed neoadjuvant in 2022 ? ?RADIATION:completed ? ?HORMONE TREATMENT: declined ? ?INFECTIONS: none reported ? ?LYMPHEDEMA ASSESSMENTS:  ? ?[place] RIGHT  03/27/2022  ?10 cm proximal to olecranon process 41  ?Olecranon process 30.5  ?10 cm proximal to ulnar styloid process 26.6  ?Just proximal to ulnar styloid process 18.5  ?Across hand at thumb web space 20  ?At base of 2nd digit 6.7  ?(Blank rows = not tested) ? ?[place] LEFT  03/27/2022  ?10 cm proximal to olecranon process 41.8  ?Olecranon process 30.7  ?10 cm  proximal to ulnar styloid process 26.1  ?Just proximal to ulnar styloid process 18  ?Across hand at thumb web space 20.6  ?At base of 2nd digit 6.5  ?(Blank rows = not tested) ? ? ?QUICK DASH SURVEY:  ? [name] - 03/27/22 0001   ? ? Open a tight or new jar No difficulty   ? Do heavy household chores (wash walls, wash floors) Mild difficulty   ? Carry a shopping bag or briefcase Mild difficulty   ? Wash your back Moderate difficulty   ? Use a knife to cut food Mild difficulty   ? Recreational activities in which you take some force or impact through your arm, shoulder, or hand (golf, hammering, tennis) Moderate difficulty   ? During the past week, to what extent has your arm, shoulder or hand problem interfered with your normal social activities with family, friends, neighbors, or groups? Slightly   ? During the past week, to what extent has your arm, shoulder or hand problem limited your work or other regular daily activities Slightly   ? Arm, shoulder, or hand  pain. Mild   ? Tingling (pins and needles) in your arm, shoulder, or hand Mild   ? Difficulty Sleeping Mild difficulty   ? DASH Score 27.27 %   ? ?  ?  ? ?  ? ? ? ? ?TODAY'S TREATMENT  ?In supine: Began instructing in basic principles of manual lymphatic drainage and correct skin stretch technique. Short neck, 5 diaphragmatic breaths, left axillary nodes and establishment of interaxillary pathway, R inguinal nodes and establishment of axillo inguinal pathway, R breast moving fluid towards pathway then retracing all steps. Had pt return demonstrate correct technique for self MLD. ?Edema management: created foam chip pack for pt to wear in bra on inferior breast to soften fibrosis ? ?PATIENT EDUCATION:  ?Education details: anatomy and physiology of the lymphatic system, need for compression and chip pack in compression bra, basic principles and sequence of self MLD ?Person educated: Patient ?Education method: Explanation and Demonstration ?Education  comprehension: verbalized understanding and returned demonstration ? ? ?HOME EXERCISE PROGRAM: ?Self MLD at least once daily for 20 min ? ?ASSESSMENT: ? ?CLINICAL IMPRESSION: ?Patient is a 46 y.o. female who was se

## 2022-03-28 ENCOUNTER — Encounter: Payer: Self-pay | Admitting: Hematology and Oncology

## 2022-04-03 ENCOUNTER — Encounter (HOSPITAL_COMMUNITY): Payer: Self-pay

## 2022-04-10 ENCOUNTER — Ambulatory Visit: Payer: No Typology Code available for payment source | Admitting: Physical Therapy

## 2022-04-10 ENCOUNTER — Encounter: Payer: Self-pay | Admitting: Physical Therapy

## 2022-04-10 ENCOUNTER — Encounter: Payer: Self-pay | Admitting: Hematology and Oncology

## 2022-04-10 DIAGNOSIS — C50411 Malignant neoplasm of upper-outer quadrant of right female breast: Secondary | ICD-10-CM | POA: Diagnosis not present

## 2022-04-10 DIAGNOSIS — R293 Abnormal posture: Secondary | ICD-10-CM

## 2022-04-10 DIAGNOSIS — Z170 Estrogen receptor positive status [ER+]: Secondary | ICD-10-CM

## 2022-04-10 DIAGNOSIS — I890 Lymphedema, not elsewhere classified: Secondary | ICD-10-CM

## 2022-04-10 NOTE — Therapy (Signed)
?OUTPATIENT PHYSICAL THERAPY TREATMENT NOTE ? ? ?Patient Name: Diane Miranda ?MRN: [id number] ?DOB:1976/02/26, 46 y.o., female ?Today's Date: 04/10/2022 ? ?PCP: [name], MD ?REFERRING PROVIDER: [name], MD ? ?END OF SESSION:  ? PT End of Session - 04/10/22 1508   ? ? Visit Number 2   ? Number of Visits 9   ? Date for PT Re-Evaluation 04/24/22   ? PT Start Time 1506   ? PT Stop Time 6237   ? PT Time Calculation (min) 48 min   ? Activity Tolerance Patient tolerated treatment well   ? Behavior During Therapy [facility] for tasks assessed/performed   ? ?  ?  ? ?  ? ? ?Past Medical History:  ?Diagnosis Date  ? Allergy 2000  ? Asthma   ? as child  ? Breast cancer ([place])   ? Family history of lung cancer 03/08/2021  ? Family history of uterine cancer 03/08/2021  ? GERD (gastroesophageal reflux disease)   ? Headache   ? History of COVID-19   ? History of radiation therapy   ? Right breast- 10/09/21-11/29/21- Dr. Gery Pray  ? Hyperlipidemia   ? Hypertension   ? Pre-diabetes   ? Prediabetes   ? right breast ca dx'd 02/2021  ? Trimalleolar fracture of ankle, closed, left, initial encounter   ? ?Past Surgical History:  ?Procedure Laterality Date  ? BREAST BIOPSY Right 03/22/2021  ? Procedure: PUNCH BIOPSY OF RIGHT AREOLA;  Surgeon: Stark Klein, MD;  Location: [place];  Service: General;  Laterality: Right;  ? BREAST LUMPECTOMY WITH RADIOACTIVE SEED AND SENTINEL LYMPH NODE BIOPSY Right 08/29/2021  ? Procedure: RIGHT BREAST LUMPECTOMY WITH RADIOACTIVE SEED AND RIGHT AXILLARY SENTINEL LYMPH NODE BIOPSY;  Surgeon: Stark Klein, MD;  Location: [place];  Service: General;  Laterality: Right;  ? FRACTURE SURGERY  2018  ? NO PAST SURGERIES    ? ORIF ANKLE FRACTURE Left 07/10/2017  ? Procedure: OPEN REDUCTION INTERNAL FIXATION (ORIF) trimallolar ANKLE FRACTURE;  Surgeon: Wylene Simmer, MD;  Location: [place];  Service: Orthopedics;  Laterality: Left;  ? PORTACATH PLACEMENT Left  03/22/2021  ? Procedure: INSERTION PORT-A-CATH;  Surgeon: Stark Klein, MD;  Location: [place];  Service: General;  Laterality: Left;  ? RADIOACTIVE SEED GUIDED AXILLARY SENTINEL LYMPH NODE Right 08/29/2021  ? Procedure: RADIOACTIVE SEED GUIDED AXILLARY SENTINEL LYMPH NODE EXCISION;  Surgeon: Stark Klein, MD;  Location: [place];  Service: General;  Laterality: Right;  ? ?Patient Active Problem List  ? Diagnosis Date Noted  ? Port-A-Cath in place 09/11/2021  ? History of COVID-19   ? Genetic testing 03/27/2021  ? Family history of uterine cancer 03/08/2021  ? Family history of lung cancer 03/08/2021  ? Malignant neoplasm of upper-outer quadrant of right breast in female, estrogen receptor positive ([place]) 03/01/2021  ? SINUSITIS - ACUTE-NOS 09/07/2009  ? EXUDATIVE PHARYNGITIS 09/07/2009  ? LOW HDL 08/03/2008  ? 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 OBESITY 08/01/2008  ? ALLERGIC RHINITIS 08/01/2008  ? VAGINITIS 08/01/2008  ? HEADACHE 08/01/2008  ? ? ?REFERRING DIAG: C50.411,Z17.0 (ICD-10-CM) - Malignant neoplasm of upper-outer quadrant of right breast in female, estrogen receptor positive ([place])  ? ?THERAPY DIAG:  ?Lymphedema, not elsewhere classified ? ?Abnormal posture ? ?Malignant neoplasm of upper-outer quadrant of right breast in female, estrogen receptor positive ([place]) ? ?PERTINENT HISTORY: Diagnosed on 03/07/2021 with R breast cancer stage IIB (ER/PR+, HER2+), underwent a R lumpectomy and SLNB (0/5) on  08/29/21, she completed  neoadjuvant chemo, and adjuvant radiation and recently completed chemo, declined antiestrogen therapy ? ?PRECAUTIONS: Other: at risk for lymphedema R UE ? ?SUBJECTIVE: I have been trying the self massage and I think it is helping.  ? ?PAIN:  ?Are you having pain? No ? ? ?OBJECTIVE: (objective measures completed at initial evaluation unless otherwise dated) ? ? ?COGNITION: ?           Overall cognitive status: Within functional limits for tasks assessed  ?  ?PALPATION: Increased fibrosis in  inferior R breast and medial inferior breast ?  ?OBSERVATIONS / OTHER ASSESSMENTS: fullness noted in R breast ?  ?  ?POSTURE: rounded shoulders, forward head ?  ?UPPER EXTREMITY AROM/PROM: ?  ?A/PROM RIGHT  03/27/2022 ?   ?Shoulder extension 68  ?Shoulder flexion 168  ?Shoulder abduction 170  ?Shoulder internal rotation 46  ?Shoulder external rotation 78  ?                        (Blank rows = not tested) ?  ?A/PROM LEFT  03/27/2022  ?Shoulder extension 62  ?Shoulder flexion 170  ?Shoulder abduction 175  ?Shoulder internal rotation 65  ?Shoulder external rotation 84  ?                        (Blank rows = not tested) ?  ?  ?LYMPHEDEMA ASSESSMENTS:  ?  ?SURGERY TYPE/DATE: R lumpectomy and SLNB (0/5) on 08/29/21 ?  ?NUMBER OF LYMPH NODES REMOVED: 5 all negative ?  ?CHEMOTHERAPY: completed neoadjuvant in 2022 ?  ?RADIATION:completed ?  ?HORMONE TREATMENT: declined ?  ?INFECTIONS: none reported ?  ?LYMPHEDEMA ASSESSMENTS:  ?  ?[place] RIGHT  03/27/2022  ?10 cm proximal to olecranon process 41  ?Olecranon process 30.5  ?10 cm proximal to ulnar styloid process 26.6  ?Just proximal to ulnar styloid process 18.5  ?Across hand at thumb web space 20  ?At base of 2nd digit 6.7  ?(Blank rows = not tested) ?  ?[place] LEFT  03/27/2022  ?10 cm proximal to olecranon process 41.8  ?Olecranon process 30.7  ?10 cm proximal to ulnar styloid process 26.1  ?Just proximal to ulnar styloid process 18  ?Across hand at thumb web space 20.6  ?At base of 2nd digit 6.5  ?(Blank rows = not tested) ?  ?  ?QUICK DASH SURVEY:  ?  [name] - 03/27/22 0001   ?  ?  Open a tight or new jar No difficulty   ?  Do heavy household chores (wash walls, wash floors) Mild difficulty   ?  Carry a shopping bag or briefcase Mild difficulty   ?  Wash your back Moderate difficulty   ?  Use a knife to cut food Mild difficulty   ?  Recreational activities in which you take some force or impact through your arm, shoulder, or hand (golf, hammering, tennis) Moderate  difficulty   ?  During the past week, to what extent has your arm, shoulder or hand problem interfered with your normal social activities with family, friends, neighbors, or groups? Slightly   ?  During the past week, to what extent has your arm, shoulder or hand problem limited your work or other regular daily activities Slightly   ?  Arm, shoulder, or hand pain. Mild   ?  Tingling (pins and needles) in your arm, shoulder, or hand Mild   ?  Difficulty Sleeping Mild difficulty   ?  DASH Score 27.27 %   ?  ?   ?  ?  ?   ?  ?  ?  ?  ?TREATMENT  ?04/10/22: In supine: Short neck, 5 diaphragmatic breaths, left axillary nodes and establishment of interaxillary pathway, R inguinal nodes and establishment of axillo inguinal pathway, R breast moving fluid towards pathway then retracing all steps. Had pt return demonstrate correct technique for self MLD and gave cues to use entire hand instead of finger tips and also to slow down when doing self MLD (each skin stretch should last approx 1 sec) ? ?03/27/22: In supine: Began instructing in basic principles of manual lymphatic drainage and correct skin stretch technique. Short neck, 5 diaphragmatic breaths, left axillary nodes and establishment of interaxillary pathway, R inguinal nodes and establishment of axillo inguinal pathway, R breast moving fluid towards pathway then retracing all steps. Had pt return demonstrate correct technique for self MLD. ?Edema management: created foam chip pack for pt to wear in bra on inferior breast to soften fibrosis ?  ?  ?PATIENT EDUCATION:  ?Education details: anatomy and physiology of the lymphatic system, need for compression and chip pack in compression bra, basic principles and sequence of self MLD ?Person educated: Patient ?Education method: Explanation and Demonstration ?Education comprehension: verbalized understanding and returned demonstration ?  ?  ?HOME EXERCISE PROGRAM: ?Self MLD at least once daily for 20 min ?  ?ASSESSMENT: ?   ?CLINICAL IMPRESSION: ?Pt returns to PT after returning from vacation. She reports the self MLD has been helping to decrease the discomfort in her right breast and her right breast swelling has been responding well t

## 2022-04-12 ENCOUNTER — Other Ambulatory Visit: Payer: Self-pay | Admitting: General Surgery

## 2022-04-12 ENCOUNTER — Encounter: Payer: Self-pay | Admitting: Physical Therapy

## 2022-04-12 ENCOUNTER — Ambulatory Visit: Payer: No Typology Code available for payment source | Admitting: Physical Therapy

## 2022-04-12 DIAGNOSIS — C50411 Malignant neoplasm of upper-outer quadrant of right female breast: Secondary | ICD-10-CM

## 2022-04-12 DIAGNOSIS — R293 Abnormal posture: Secondary | ICD-10-CM

## 2022-04-12 DIAGNOSIS — I890 Lymphedema, not elsewhere classified: Secondary | ICD-10-CM

## 2022-04-12 NOTE — Therapy (Signed)
?OUTPATIENT PHYSICAL THERAPY TREATMENT NOTE ? ? ?Patient Name: Diane Miranda ?MRN: [id number] ?DOB:Nov 22, 1976, 46 y.o., female ?Today's Date: 04/12/2022 ? ?PCP: [name], MD ?REFERRING PROVIDER: Benay Pike, MD ? ?END OF SESSION:  ? PT End of Session - 04/12/22 6222   ? ? Visit Number 3   ? Number of Visits 9   ? Date for PT Re-Evaluation 04/24/22   ? PT Start Time [phone]   ? PT Stop Time [phone]   ? PT Time Calculation (min) 46 min   ? Activity Tolerance Patient tolerated treatment well   ? Behavior During Therapy [facility] for tasks assessed/performed   ? ?  ?  ? ?  ? ? ?Past Medical History:  ?Diagnosis Date  ? Allergy 2000  ? Asthma   ? as child  ? Breast cancer ([place])   ? Family history of lung cancer 03/08/2021  ? Family history of uterine cancer 03/08/2021  ? GERD (gastroesophageal reflux disease)   ? Headache   ? History of COVID-19   ? History of radiation therapy   ? Right breast- 10/09/21-11/29/21- Dr. Gery Pray  ? Hyperlipidemia   ? Hypertension   ? Pre-diabetes   ? Prediabetes   ? right breast ca dx'd 02/2021  ? Trimalleolar fracture of ankle, closed, left, initial encounter   ? ?Past Surgical History:  ?Procedure Laterality Date  ? BREAST BIOPSY Right 03/22/2021  ? Procedure: PUNCH BIOPSY OF RIGHT AREOLA;  Surgeon: Stark Klein, MD;  Location: [place];  Service: General;  Laterality: Right;  ? BREAST LUMPECTOMY WITH RADIOACTIVE SEED AND SENTINEL LYMPH NODE BIOPSY Right 08/29/2021  ? Procedure: RIGHT BREAST LUMPECTOMY WITH RADIOACTIVE SEED AND RIGHT AXILLARY SENTINEL LYMPH NODE BIOPSY;  Surgeon: Stark Klein, MD;  Location: [place];  Service: General;  Laterality: Right;  ? FRACTURE SURGERY  2018  ? NO PAST SURGERIES    ? ORIF ANKLE FRACTURE Left 07/10/2017  ? Procedure: OPEN REDUCTION INTERNAL FIXATION (ORIF) trimallolar ANKLE FRACTURE;  Surgeon: Wylene Simmer, MD;  Location: [place];  Service: Orthopedics;  Laterality: Left;  ? PORTACATH PLACEMENT Left 03/22/2021   ? Procedure: INSERTION PORT-A-CATH;  Surgeon: Stark Klein, MD;  Location: [place];  Service: General;  Laterality: Left;  ? RADIOACTIVE SEED GUIDED AXILLARY SENTINEL LYMPH NODE Right 08/29/2021  ? Procedure: RADIOACTIVE SEED GUIDED AXILLARY SENTINEL LYMPH NODE EXCISION;  Surgeon: Stark Klein, MD;  Location: [place];  Service: General;  Laterality: Right;  ? ?Patient Active Problem List  ? Diagnosis Date Noted  ? Port-A-Cath in place 09/11/2021  ? History of COVID-19   ? Genetic testing 03/27/2021  ? Family history of uterine cancer 03/08/2021  ? Family history of lung cancer 03/08/2021  ? Malignant neoplasm of upper-outer quadrant of right breast in female, estrogen receptor positive ([place]) 03/01/2021  ? SINUSITIS - ACUTE-NOS 09/07/2009  ? EXUDATIVE PHARYNGITIS 09/07/2009  ? LOW HDL 08/03/2008  ? 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 OBESITY 08/01/2008  ? ALLERGIC RHINITIS 08/01/2008  ? VAGINITIS 08/01/2008  ? HEADACHE 08/01/2008  ? ? ?REFERRING DIAG: C50.411,Z17.0 (ICD-10-CM) - Malignant neoplasm of upper-outer quadrant of right breast in female, estrogen receptor positive ([place])  ? ?THERAPY DIAG:  ?Lymphedema, not elsewhere classified ? ?Abnormal posture ? ?Malignant neoplasm of upper-outer quadrant of right breast in female, estrogen receptor positive ([place]) ? ?PERTINENT HISTORY: Diagnosed on 03/07/2021 with R breast cancer stage IIB (ER/PR+, HER2+), underwent a R lumpectomy and SLNB (0/5) on  08/29/21, she completed neoadjuvant  chemo, and adjuvant radiation and recently completed chemo, declined antiestrogen therapy ? ?PRECAUTIONS: Other: at risk for lymphedema R UE ? ?SUBJECTIVE: I think the breast is doing good.  ? ?PAIN:  ?Are you having pain? No ? ? ?OBJECTIVE: (objective measures completed at initial evaluation unless otherwise dated) ? ? ?COGNITION: ?           Overall cognitive status: Within functional limits for tasks assessed  ?  ?PALPATION: Increased fibrosis in inferior R breast and medial inferior  breast ?  ?OBSERVATIONS / OTHER ASSESSMENTS: fullness noted in R breast ?  ?  ?POSTURE: rounded shoulders, forward head ?  ?UPPER EXTREMITY AROM/PROM: ?  ?A/PROM RIGHT  03/27/2022 ?   ?Shoulder extension 68  ?Shoulder flexion 168  ?Shoulder abduction 170  ?Shoulder internal rotation 46  ?Shoulder external rotation 78  ?                        (Blank rows = not tested) ?  ?A/PROM LEFT  03/27/2022  ?Shoulder extension 62  ?Shoulder flexion 170  ?Shoulder abduction 175  ?Shoulder internal rotation 65  ?Shoulder external rotation 84  ?                        (Blank rows = not tested) ?  ?  ?LYMPHEDEMA ASSESSMENTS:  ?  ?SURGERY TYPE/DATE: R lumpectomy and SLNB (0/5) on 08/29/21 ?  ?NUMBER OF LYMPH NODES REMOVED: 5 all negative ?  ?CHEMOTHERAPY: completed neoadjuvant in 2022 ?  ?RADIATION:completed ?  ?HORMONE TREATMENT: declined ?  ?INFECTIONS: none reported ?  ?LYMPHEDEMA ASSESSMENTS:  ?  ?[place] RIGHT  03/27/2022  ?10 cm proximal to olecranon process 41  ?Olecranon process 30.5  ?10 cm proximal to ulnar styloid process 26.6  ?Just proximal to ulnar styloid process 18.5  ?Across hand at thumb web space 20  ?At base of 2nd digit 6.7  ?(Blank rows = not tested) ?  ?[place] LEFT  03/27/2022  ?10 cm proximal to olecranon process 41.8  ?Olecranon process 30.7  ?10 cm proximal to ulnar styloid process 26.1  ?Just proximal to ulnar styloid process 18  ?Across hand at thumb web space 20.6  ?At base of 2nd digit 6.5  ?(Blank rows = not tested) ?  ?  ?QUICK DASH SURVEY:  ?  [name] - 03/27/22 0001   ?  ?  Open a tight or new jar No difficulty   ?  Do heavy household chores (wash walls, wash floors) Mild difficulty   ?  Carry a shopping bag or briefcase Mild difficulty   ?  Wash your back Moderate difficulty   ?  Use a knife to cut food Mild difficulty   ?  Recreational activities in which you take some force or impact through your arm, shoulder, or hand (golf, hammering, tennis) Moderate difficulty   ?  During the past week, to  what extent has your arm, shoulder or hand problem interfered with your normal social activities with family, friends, neighbors, or groups? Slightly   ?  During the past week, to what extent has your arm, shoulder or hand problem limited your work or other regular daily activities Slightly   ?  Arm, shoulder, or hand pain. Mild   ?  Tingling (pins and needles) in your arm, shoulder, or hand Mild   ?  Difficulty Sleeping Mild difficulty   ?  DASH Score 27.27 %   ?  ?   ?  ?  ?   ?  ?  ?  ?  ?  TREATMENT  ? ?04/12/22: In supine: Short neck, 5 diaphragmatic breaths, left axillary nodes and establishment of interaxillary pathway, R inguinal nodes and establishment of axillo inguinal pathway, R breast moving fluid towards pathway then retracing all steps. Issued info to pt to obtain a solaris swell spot for her bra ? ?04/10/22: In supine: Short neck, 5 diaphragmatic breaths, left axillary nodes and establishment of interaxillary pathway, R inguinal nodes and establishment of axillo inguinal pathway, R breast moving fluid towards pathway then retracing all steps. Had pt return demonstrate correct technique for self MLD and gave cues to use entire hand instead of finger tips and also to slow down when doing self MLD (each skin stretch should last approx 1 sec) ? ?03/27/22: In supine: Began instructing in basic principles of manual lymphatic drainage and correct skin stretch technique. Short neck, 5 diaphragmatic breaths, left axillary nodes and establishment of interaxillary pathway, R inguinal nodes and establishment of axillo inguinal pathway, R breast moving fluid towards pathway then retracing all steps. Had pt return demonstrate correct technique for self MLD. ?Edema management: created foam chip pack for pt to wear in bra on inferior breast to soften fibrosis ?  ?  ?PATIENT EDUCATION:  ?Education details: anatomy and physiology of the lymphatic system, need for compression and chip pack in compression bra, basic  principles and sequence of self MLD ?Person educated: Patient ?Education method: Explanation and Demonstration ?Education comprehension: verbalized understanding and returned demonstration ?  ?  ?HOME EXERCISE PROG

## 2022-04-16 ENCOUNTER — Ambulatory Visit: Payer: No Typology Code available for payment source | Attending: Hematology and Oncology | Admitting: Rehabilitation

## 2022-04-16 ENCOUNTER — Encounter: Payer: Self-pay | Admitting: Rehabilitation

## 2022-04-16 DIAGNOSIS — Z483 Aftercare following surgery for neoplasm: Secondary | ICD-10-CM | POA: Diagnosis present

## 2022-04-16 DIAGNOSIS — C50411 Malignant neoplasm of upper-outer quadrant of right female breast: Secondary | ICD-10-CM | POA: Diagnosis present

## 2022-04-16 DIAGNOSIS — R293 Abnormal posture: Secondary | ICD-10-CM | POA: Diagnosis present

## 2022-04-16 DIAGNOSIS — Z170 Estrogen receptor positive status [ER+]: Secondary | ICD-10-CM | POA: Insufficient documentation

## 2022-04-16 DIAGNOSIS — I890 Lymphedema, not elsewhere classified: Secondary | ICD-10-CM | POA: Diagnosis present

## 2022-04-16 NOTE — Therapy (Signed)
?OUTPATIENT PHYSICAL THERAPY TREATMENT NOTE ? ? ?Patient Name: Diane Miranda ?MRN: [id number] ?DOB:10/19/1976, 46 y.o., female ?Today's Date: 04/16/2022 ? ?PCP: [name], MD ?REFERRING PROVIDER: Benay Pike, MD ? ?END OF SESSION:  ? PT End of Session - 04/16/22 1606   ? ? Visit Number 4   ? Number of Visits 9   ? Date for PT Re-Evaluation 04/24/22   ? PT Start Time 3790   ? PT Stop Time 2409   ? PT Time Calculation (min) 40 min   ? Activity Tolerance Patient tolerated treatment well   ? Behavior During Therapy [facility] for tasks assessed/performed   ? ?  ?  ? ?  ? ? ?Past Medical History:  ?Diagnosis Date  ? Allergy 2000  ? Asthma   ? as child  ? Breast cancer ([place])   ? Family history of lung cancer 03/08/2021  ? Family history of uterine cancer 03/08/2021  ? GERD (gastroesophageal reflux disease)   ? Headache   ? History of COVID-19   ? History of radiation therapy   ? Right breast- 10/09/21-11/29/21- Dr. Gery Pray  ? Hyperlipidemia   ? Hypertension   ? Pre-diabetes   ? Prediabetes   ? right breast ca dx'd 02/2021  ? Trimalleolar fracture of ankle, closed, left, initial encounter   ? ?Past Surgical History:  ?Procedure Laterality Date  ? BREAST BIOPSY Right 03/22/2021  ? Procedure: PUNCH BIOPSY OF RIGHT AREOLA;  Surgeon: Stark Klein, MD;  Location: [place];  Service: General;  Laterality: Right;  ? BREAST LUMPECTOMY WITH RADIOACTIVE SEED AND SENTINEL LYMPH NODE BIOPSY Right 08/29/2021  ? Procedure: RIGHT BREAST LUMPECTOMY WITH RADIOACTIVE SEED AND RIGHT AXILLARY SENTINEL LYMPH NODE BIOPSY;  Surgeon: Stark Klein, MD;  Location: [place];  Service: General;  Laterality: Right;  ? FRACTURE SURGERY  2018  ? NO PAST SURGERIES    ? ORIF ANKLE FRACTURE Left 07/10/2017  ? Procedure: OPEN REDUCTION INTERNAL FIXATION (ORIF) trimallolar ANKLE FRACTURE;  Surgeon: Wylene Simmer, MD;  Location: [place];  Service: Orthopedics;  Laterality: Left;  ? PORTACATH PLACEMENT Left 03/22/2021   ? Procedure: INSERTION PORT-A-CATH;  Surgeon: Stark Klein, MD;  Location: [place];  Service: General;  Laterality: Left;  ? RADIOACTIVE SEED GUIDED AXILLARY SENTINEL LYMPH NODE Right 08/29/2021  ? Procedure: RADIOACTIVE SEED GUIDED AXILLARY SENTINEL LYMPH NODE EXCISION;  Surgeon: Stark Klein, MD;  Location: [place];  Service: General;  Laterality: Right;  ? ?Patient Active Problem List  ? Diagnosis Date Noted  ? Port-A-Cath in place 09/11/2021  ? History of COVID-19   ? Genetic testing 03/27/2021  ? Family history of uterine cancer 03/08/2021  ? Family history of lung cancer 03/08/2021  ? Malignant neoplasm of upper-outer quadrant of right breast in female, estrogen receptor positive ([place]) 03/01/2021  ? SINUSITIS - ACUTE-NOS 09/07/2009  ? EXUDATIVE PHARYNGITIS 09/07/2009  ? LOW HDL 08/03/2008  ? 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 OBESITY 08/01/2008  ? ALLERGIC RHINITIS 08/01/2008  ? VAGINITIS 08/01/2008  ? HEADACHE 08/01/2008  ? ? ?REFERRING DIAG: C50.411,Z17.0 (ICD-10-CM) - Malignant neoplasm of upper-outer quadrant of right breast in female, estrogen receptor positive ([place])  ? ?THERAPY DIAG:  ?Lymphedema, not elsewhere classified ? ?Abnormal posture ? ?Malignant neoplasm of upper-outer quadrant of right breast in female, estrogen receptor positive ([employer]) ? ?Aftercare following surgery for neoplasm ? ?PERTINENT HISTORY: Diagnosed on 03/07/2021 with R breast cancer stage IIB (ER/PR+, HER2+), underwent a R lumpectomy and SLNB (0/5)  on  08/29/21, she completed neoadjuvant chemo, and adjuvant radiation and recently completed chemo, declined antiestrogen therapy ? ?PRECAUTIONS: Other: at risk for lymphedema R UE ? ?SUBJECTIVE: I am still feeling better  ? ?PAIN:  ?Are you having pain? No ? ? ?OBJECTIVE: (objective measures completed at initial evaluation unless otherwise dated) ? ? ?COGNITION: ?           Overall cognitive status: Within functional limits for tasks assessed  ?  ?PALPATION: Increased fibrosis in inferior  R breast and medial inferior breast ?  ?OBSERVATIONS / OTHER ASSESSMENTS: fullness noted in R breast ?  ?  ?POSTURE: rounded shoulders, forward head ?  ?UPPER EXTREMITY AROM/PROM: ?  ?A/PROM RIGHT  03/27/2022 ?   ?Shoulder extension 68  ?Shoulder flexion 168  ?Shoulder abduction 170  ?Shoulder internal rotation 46  ?Shoulder external rotation 78  ?                        (Blank rows = not tested) ?  ?A/PROM LEFT  03/27/2022  ?Shoulder extension 62  ?Shoulder flexion 170  ?Shoulder abduction 175  ?Shoulder internal rotation 65  ?Shoulder external rotation 84  ?                        (Blank rows = not tested) ?  ?  ?LYMPHEDEMA ASSESSMENTS:  ?  ?SURGERY TYPE/DATE: R lumpectomy and SLNB (0/5) on 08/29/21 ?  ?NUMBER OF LYMPH NODES REMOVED: 5 all negative ?  ?CHEMOTHERAPY: completed neoadjuvant in 2022 ?  ?RADIATION:completed ?  ?HORMONE TREATMENT: declined ?  ?INFECTIONS: none reported ?  ?LYMPHEDEMA ASSESSMENTS:  ?  ?[place] RIGHT  03/27/2022  ?10 cm proximal to olecranon process 41  ?Olecranon process 30.5  ?10 cm proximal to ulnar styloid process 26.6  ?Just proximal to ulnar styloid process 18.5  ?Across hand at thumb web space 20  ?At base of 2nd digit 6.7  ?(Blank rows = not tested) ?  ?[place] LEFT  03/27/2022  ?10 cm proximal to olecranon process 41.8  ?Olecranon process 30.7  ?10 cm proximal to ulnar styloid process 26.1  ?Just proximal to ulnar styloid process 18  ?Across hand at thumb web space 20.6  ?At base of 2nd digit 6.5  ?(Blank rows = not tested) ?  ?  ?QUICK DASH SURVEY:  ?  [name] - 03/27/22 0001   ?  ?  Open a tight or new jar No difficulty   ?  Do heavy household chores (wash walls, wash floors) Mild difficulty   ?  Carry a shopping bag or briefcase Mild difficulty   ?  Wash your back Moderate difficulty   ?  Use a knife to cut food Mild difficulty   ?  Recreational activities in which you take some force or impact through your arm, shoulder, or hand (golf, hammering, tennis) Moderate difficulty    ?  During the past week, to what extent has your arm, shoulder or hand problem interfered with your normal social activities with family, friends, neighbors, or groups? Slightly   ?  During the past week, to what extent has your arm, shoulder or hand problem limited your work or other regular daily activities Slightly   ?  Arm, shoulder, or hand pain. Mild   ?  Tingling (pins and needles) in your arm, shoulder, or hand Mild   ?  Difficulty Sleeping Mild difficulty   ?  DASH Score 27.27 %   ?  ?   ?  ?  ?   ?  ?  ?  ?  ?  TREATMENT  ?04/16/22: In supine: Short neck, 5 diaphragmatic breaths, bil axillary nodes and establishment of interaxillary pathway, R inguinal nodes and establishment of axillo inguinal pathway, R breast moving fluid towards pathway then in left sidelying Rt lateral breast and posterior interaxillary work and the retracing all steps back in supine. ? ?04/12/22: In supine: Short neck, 5 diaphragmatic breaths, left axillary nodes and establishment of interaxillary pathway, R inguinal nodes and establishment of axillo inguinal pathway, R breast moving fluid towards pathway then retracing all steps. Issued info to pt to obtain a solaris swell spot for her bra ? ?04/10/22: In supine: Short neck, 5 diaphragmatic breaths, left axillary nodes and establishment of interaxillary pathway, R inguinal nodes and establishment of axillo inguinal pathway, R breast moving fluid towards pathway then retracing all steps. Had pt return demonstrate correct technique for self MLD and gave cues to use entire hand instead of finger tips and also to slow down when doing self MLD (each skin stretch should last approx 1 sec) ? ?03/27/22: In supine: Began instructing in basic principles of manual lymphatic drainage and correct skin stretch technique. Short neck, 5 diaphragmatic breaths, left axillary nodes and establishment of interaxillary pathway, R inguinal nodes and establishment of axillo inguinal pathway, R breast moving  fluid towards pathway then retracing all steps. Had pt return demonstrate correct technique for self MLD. ?Edema management: created foam chip pack for pt to wear in bra on inferior breast to soften fibro

## 2022-04-17 ENCOUNTER — Encounter: Payer: Self-pay | Admitting: Hematology and Oncology

## 2022-04-18 ENCOUNTER — Ambulatory Visit: Payer: No Typology Code available for payment source | Admitting: Physical Therapy

## 2022-04-18 ENCOUNTER — Encounter: Payer: Self-pay | Admitting: Physical Therapy

## 2022-04-18 DIAGNOSIS — I890 Lymphedema, not elsewhere classified: Secondary | ICD-10-CM

## 2022-04-18 DIAGNOSIS — C50411 Malignant neoplasm of upper-outer quadrant of right female breast: Secondary | ICD-10-CM

## 2022-04-18 DIAGNOSIS — R293 Abnormal posture: Secondary | ICD-10-CM

## 2022-04-18 NOTE — Therapy (Signed)
?OUTPATIENT PHYSICAL THERAPY TREATMENT NOTE ? ? ?Patient Name: Diane Miranda ?MRN: [id number] ?DOB:1976-09-10, 46 y.o., female ?Today's Date: 04/18/2022 ? ?PCP: Diane Docker, MD ?REFERRING PROVIDER: Benay Pike, MD ? ?END OF SESSION:  ? PT End of Session - 04/18/22 1503   ? ? Visit Number 5   ? Number of Visits 9   ? Date for PT Re-Evaluation 04/24/22   ? PT Start Time 1502   ? PT Stop Time 2831   ? PT Time Calculation (min) 52 min   ? Activity Tolerance Patient tolerated treatment well   ? Behavior During Therapy [facility] for tasks assessed/performed   ? ?  ?  ? ?  ? ? ?Past Medical History:  ?Diagnosis Date  ? Allergy 2000  ? Asthma   ? as child  ? Breast cancer ([place])   ? Family history of lung cancer 03/08/2021  ? Family history of uterine cancer 03/08/2021  ? GERD (gastroesophageal reflux disease)   ? Headache   ? History of COVID-19   ? History of radiation therapy   ? Right breast- 10/09/21-11/29/21- Dr. Gery Miranda  ? Hyperlipidemia   ? Hypertension   ? Pre-diabetes   ? Prediabetes   ? right breast ca dx'd 02/2021  ? Trimalleolar fracture of ankle, closed, left, initial encounter   ? ?Past Surgical History:  ?Procedure Laterality Date  ? BREAST BIOPSY Right 03/22/2021  ? Procedure: PUNCH BIOPSY OF RIGHT AREOLA;  Surgeon: Diane Klein, MD;  Location: [place];  Service: General;  Laterality: Right;  ? BREAST LUMPECTOMY WITH RADIOACTIVE SEED AND SENTINEL LYMPH NODE BIOPSY Right 08/29/2021  ? Procedure: RIGHT BREAST LUMPECTOMY WITH RADIOACTIVE SEED AND RIGHT AXILLARY SENTINEL LYMPH NODE BIOPSY;  Surgeon: Diane Klein, MD;  Location: [place];  Service: General;  Laterality: Right;  ? FRACTURE SURGERY  2018  ? NO PAST SURGERIES    ? ORIF ANKLE FRACTURE Left 07/10/2017  ? Procedure: OPEN REDUCTION INTERNAL FIXATION (ORIF) trimallolar ANKLE FRACTURE;  Surgeon: Diane Simmer, MD;  Location: [place];  Service: Orthopedics;  Laterality: Left;  ? PORTACATH PLACEMENT Left 03/22/2021   ? Procedure: INSERTION PORT-A-CATH;  Surgeon: Diane Klein, MD;  Location: [place];  Service: General;  Laterality: Left;  ? RADIOACTIVE SEED GUIDED AXILLARY SENTINEL LYMPH NODE Right 08/29/2021  ? Procedure: RADIOACTIVE SEED GUIDED AXILLARY SENTINEL LYMPH NODE EXCISION;  Surgeon: Diane Klein, MD;  Location: [place];  Service: General;  Laterality: Right;  ? ?Patient Active Problem List  ? Diagnosis Date Noted  ? Port-A-Cath in place 09/11/2021  ? History of COVID-19   ? Genetic testing 03/27/2021  ? Family history of uterine cancer 03/08/2021  ? Family history of lung cancer 03/08/2021  ? Malignant neoplasm of upper-outer quadrant of right breast in female, estrogen receptor positive ([place]) 03/01/2021  ? SINUSITIS - ACUTE-NOS 09/07/2009  ? EXUDATIVE PHARYNGITIS 09/07/2009  ? LOW HDL 08/03/2008  ? 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 OBESITY 08/01/2008  ? ALLERGIC RHINITIS 08/01/2008  ? VAGINITIS 08/01/2008  ? HEADACHE 08/01/2008  ? ? ?REFERRING DIAG: C50.411,Z17.0 (ICD-10-CM) - Malignant neoplasm of upper-outer quadrant of right breast in female, estrogen receptor positive ([place])  ? ?THERAPY DIAG:  ?Lymphedema, not elsewhere classified ? ?Abnormal posture ? ?Malignant neoplasm of upper-outer quadrant of right breast in female, estrogen receptor positive ([place]) ? ?PERTINENT HISTORY: Diagnosed on 03/07/2021 with R breast cancer stage IIB (ER/PR+, HER2+), underwent a R lumpectomy and SLNB (0/5) on  08/29/21, she completed neoadjuvant  chemo, and adjuvant radiation and recently completed chemo, declined antiestrogen therapy ? ?PRECAUTIONS: Other: at risk for lymphedema R UE ? ?SUBJECTIVE: I plan on ordering the breast pad. I have not heard about the pump yet.  ? ?PAIN:  ?Are you having pain? No ? ? ?OBJECTIVE: (objective measures completed at initial evaluation unless otherwise dated) ? ? ?COGNITION: ?           Overall cognitive status: Within functional limits for tasks assessed  ?  ?PALPATION: Increased fibrosis in  inferior R breast and medial inferior breast ?  ?OBSERVATIONS / OTHER ASSESSMENTS: fullness noted in R breast ?  ?  ?POSTURE: rounded shoulders, forward head ?  ?UPPER EXTREMITY AROM/PROM: ?  ?A/PROM RIGHT  03/27/2022 ?   ?Shoulder extension 68  ?Shoulder flexion 168  ?Shoulder abduction 170  ?Shoulder internal rotation 46  ?Shoulder external rotation 78  ?                        (Blank rows = not tested) ?  ?A/PROM LEFT  03/27/2022  ?Shoulder extension 62  ?Shoulder flexion 170  ?Shoulder abduction 175  ?Shoulder internal rotation 65  ?Shoulder external rotation 84  ?                        (Blank rows = not tested) ?  ?  ?LYMPHEDEMA ASSESSMENTS:  ?  ?SURGERY TYPE/DATE: R lumpectomy and SLNB (0/5) on 08/29/21 ?  ?NUMBER OF LYMPH NODES REMOVED: 5 all negative ?  ?CHEMOTHERAPY: completed neoadjuvant in 2022 ?  ?RADIATION:completed ?  ?HORMONE TREATMENT: declined ?  ?INFECTIONS: none reported ?  ?LYMPHEDEMA ASSESSMENTS:  ?  ?[place] RIGHT  03/27/2022  ?10 cm proximal to olecranon process 41  ?Olecranon process 30.5  ?10 cm proximal to ulnar styloid process 26.6  ?Just proximal to ulnar styloid process 18.5  ?Across hand at thumb web space 20  ?At base of 2nd digit 6.7  ?(Blank rows = not tested) ?  ?[place] LEFT  03/27/2022  ?10 cm proximal to olecranon process 41.8  ?Olecranon process 30.7  ?10 cm proximal to ulnar styloid process 26.1  ?Just proximal to ulnar styloid process 18  ?Across hand at thumb web space 20.6  ?At base of 2nd digit 6.5  ?(Blank rows = not tested) ?  ?  ?QUICK DASH SURVEY:  ?  Diane Miranda - 03/27/22 0001   ?  ?  Open a tight or new jar No difficulty   ?  Do heavy household chores (wash walls, wash floors) Mild difficulty   ?  Carry a shopping bag or briefcase Mild difficulty   ?  Wash your back Moderate difficulty   ?  Use a knife to cut food Mild difficulty   ?  Recreational activities in which you take some force or impact through your arm, shoulder, or hand (golf, hammering, tennis) Moderate  difficulty   ?  During the past week, to what extent has your arm, shoulder or hand problem interfered with your normal social activities with family, friends, neighbors, or groups? Slightly   ?  During the past week, to what extent has your arm, shoulder or hand problem limited your work or other regular daily activities Slightly   ?  Arm, shoulder, or hand pain. Mild   ?  Tingling (pins and needles) in your arm, shoulder, or hand Mild   ?  Difficulty Sleeping Mild difficulty   ?  DASH Score 27.27 %   ?  ?   ?  ?  ?   ?  ?  ?  ?  ?TREATMENT  ?04/18/22: In supine: Short neck, 5 diaphragmatic breaths, bil axillary nodes and establishment of interaxillary pathway, R inguinal nodes and establishment of axillo inguinal pathway, R breast moving fluid towards pathway then retracing all steps  ? ?04/16/22: In supine: Short neck, 5 diaphragmatic breaths, bil axillary nodes and establishment of interaxillary pathway, R inguinal nodes and establishment of axillo inguinal pathway, R breast moving fluid towards pathway then in left sidelying Rt lateral breast and posterior interaxillary work and the retracing all steps back in supine. ? ?04/12/22: In supine: Short neck, 5 diaphragmatic breaths, left axillary nodes and establishment of interaxillary pathway, R inguinal nodes and establishment of axillo inguinal pathway, R breast moving fluid towards pathway then retracing all steps. Issued info to pt to obtain a solaris swell spot for her bra ? ?04/10/22: In supine: Short neck, 5 diaphragmatic breaths, left axillary nodes and establishment of interaxillary pathway, R inguinal nodes and establishment of axillo inguinal pathway, R breast moving fluid towards pathway then retracing all steps. Had pt return demonstrate correct technique for self MLD and gave cues to use entire hand instead of finger tips and also to slow down when doing self MLD (each skin stretch should last approx 1 sec) ? ?03/27/22: In supine: Began instructing in basic  principles of manual lymphatic drainage and correct skin stretch technique. Short neck, 5 diaphragmatic breaths, left axillary nodes and establishment of interaxillary pathway, R inguinal nodes and establishme

## 2022-04-23 ENCOUNTER — Encounter: Payer: Managed Care, Other (non HMO) | Admitting: Rehabilitation

## 2022-04-25 ENCOUNTER — Ambulatory Visit: Payer: No Typology Code available for payment source | Admitting: Physical Therapy

## 2022-04-25 ENCOUNTER — Encounter: Payer: Self-pay | Admitting: Physical Therapy

## 2022-04-25 DIAGNOSIS — R293 Abnormal posture: Secondary | ICD-10-CM

## 2022-04-25 DIAGNOSIS — I890 Lymphedema, not elsewhere classified: Secondary | ICD-10-CM | POA: Diagnosis not present

## 2022-04-25 DIAGNOSIS — C50411 Malignant neoplasm of upper-outer quadrant of right female breast: Secondary | ICD-10-CM

## 2022-04-25 NOTE — Therapy (Signed)
?OUTPATIENT PHYSICAL THERAPY TREATMENT NOTE ? ? ?Patient Name: Diane Miranda ?MRN: [id number] ?DOB:Aug 31, 1976, 46 y.o., female ?Today's Date: 04/25/2022 ? ?PCP: [name], MD ?REFERRING PROVIDER: Benay Pike, MD ? ?END OF SESSION:  ? PT End of Session - 04/25/22 1506   ? ? Visit Number 6   ? Number of Visits 14   ? Date for PT Re-Evaluation 05/23/22   ? PT Start Time 1505   ? PT Stop Time 8338   ? PT Time Calculation (min) 40 min   ? Activity Tolerance Patient tolerated treatment well   ? Behavior During Therapy [facility] for tasks assessed/performed   ? ?  ?  ? ?  ? ? ?Past Medical History:  ?Diagnosis Date  ? Allergy 2000  ? Asthma   ? as child  ? Breast cancer ([place])   ? Family history of lung cancer 03/08/2021  ? Family history of uterine cancer 03/08/2021  ? GERD (gastroesophageal reflux disease)   ? Headache   ? History of COVID-19   ? History of radiation therapy   ? Right breast- 10/09/21-11/29/21- Dr. Gery Pray  ? Hyperlipidemia   ? Hypertension   ? Pre-diabetes   ? Prediabetes   ? right breast ca dx'd 02/2021  ? Trimalleolar fracture of ankle, closed, left, initial encounter   ? ?Past Surgical History:  ?Procedure Laterality Date  ? BREAST BIOPSY Right 03/22/2021  ? Procedure: PUNCH BIOPSY OF RIGHT AREOLA;  Surgeon: Stark Klein, MD;  Location: [place];  Service: General;  Laterality: Right;  ? BREAST LUMPECTOMY WITH RADIOACTIVE SEED AND SENTINEL LYMPH NODE BIOPSY Right 08/29/2021  ? Procedure: RIGHT BREAST LUMPECTOMY WITH RADIOACTIVE SEED AND RIGHT AXILLARY SENTINEL LYMPH NODE BIOPSY;  Surgeon: Stark Klein, MD;  Location: [place];  Service: General;  Laterality: Right;  ? FRACTURE SURGERY  2018  ? NO PAST SURGERIES    ? ORIF ANKLE FRACTURE Left 07/10/2017  ? Procedure: OPEN REDUCTION INTERNAL FIXATION (ORIF) trimallolar ANKLE FRACTURE;  Surgeon: Wylene Simmer, MD;  Location: [place];  Service: Orthopedics;  Laterality: Left;  ? PORTACATH PLACEMENT Left 03/22/2021   ? Procedure: INSERTION PORT-A-CATH;  Surgeon: Stark Klein, MD;  Location: [place];  Service: General;  Laterality: Left;  ? RADIOACTIVE SEED GUIDED AXILLARY SENTINEL LYMPH NODE Right 08/29/2021  ? Procedure: RADIOACTIVE SEED GUIDED AXILLARY SENTINEL LYMPH NODE EXCISION;  Surgeon: Stark Klein, MD;  Location: [place];  Service: General;  Laterality: Right;  ? ?Patient Active Problem List  ? Diagnosis Date Noted  ? Port-A-Cath in place 09/11/2021  ? History of COVID-19   ? Genetic testing 03/27/2021  ? Family history of uterine cancer 03/08/2021  ? Family history of lung cancer 03/08/2021  ? Malignant neoplasm of upper-outer quadrant of right breast in female, estrogen receptor positive ([place]) 03/01/2021  ? SINUSITIS - ACUTE-NOS 09/07/2009  ? EXUDATIVE PHARYNGITIS 09/07/2009  ? LOW HDL 08/03/2008  ? 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 OBESITY 08/01/2008  ? ALLERGIC RHINITIS 08/01/2008  ? VAGINITIS 08/01/2008  ? HEADACHE 08/01/2008  ? ? ?REFERRING DIAG: C50.411,Z17.0 (ICD-10-CM) - Malignant neoplasm of upper-outer quadrant of right breast in female, estrogen receptor positive ([place])  ? ?THERAPY DIAG:  ?Lymphedema, not elsewhere classified ? ?Abnormal posture ? ?Malignant neoplasm of upper-outer quadrant of right breast in female, estrogen receptor positive ([place]) ? ?PERTINENT HISTORY: Diagnosed on 03/07/2021 with R breast cancer stage IIB (ER/PR+, HER2+), underwent a R lumpectomy and SLNB (0/5) on  08/29/21, she completed neoadjuvant  chemo, and adjuvant radiation and recently completed chemo, declined antiestrogen therapy ? ?PRECAUTIONS: Other: at risk for lymphedema R UE ? ?SUBJECTIVE: The swelling is definitely doing better. I ordered the breast pad online.  ? ?PAIN:  ?Are you having pain? No ? ? ?OBJECTIVE: (objective measures completed at initial evaluation unless otherwise dated) ? ? ?COGNITION: ?           Overall cognitive status: Within functional limits for tasks assessed  ?  ?PALPATION: Increased fibrosis in  inferior R breast and medial inferior breast ?  ?OBSERVATIONS / OTHER ASSESSMENTS: fullness noted in R breast ?  ?  ?POSTURE: rounded shoulders, forward head ?  ?UPPER EXTREMITY AROM/PROM: ?  ?A/PROM RIGHT  03/27/2022 ?   ?Shoulder extension 68  ?Shoulder flexion 168  ?Shoulder abduction 170  ?Shoulder internal rotation 46  ?Shoulder external rotation 78  ?                        (Blank rows = not tested) ?  ?A/PROM LEFT  03/27/2022  ?Shoulder extension 62  ?Shoulder flexion 170  ?Shoulder abduction 175  ?Shoulder internal rotation 65  ?Shoulder external rotation 84  ?                        (Blank rows = not tested) ?  ?  ?LYMPHEDEMA ASSESSMENTS:  ?  ?SURGERY TYPE/DATE: R lumpectomy and SLNB (0/5) on 08/29/21 ?  ?NUMBER OF LYMPH NODES REMOVED: 5 all negative ?  ?CHEMOTHERAPY: completed neoadjuvant in 2022 ?  ?RADIATION:completed ?  ?HORMONE TREATMENT: declined ?  ?INFECTIONS: none reported ?  ?LYMPHEDEMA ASSESSMENTS:  ?  ?[place] RIGHT  03/27/2022  ?10 cm proximal to olecranon process 41  ?Olecranon process 30.5  ?10 cm proximal to ulnar styloid process 26.6  ?Just proximal to ulnar styloid process 18.5  ?Across hand at thumb web space 20  ?At base of 2nd digit 6.7  ?(Blank rows = not tested) ?  ?[place] LEFT  03/27/2022  ?10 cm proximal to olecranon process 41.8  ?Olecranon process 30.7  ?10 cm proximal to ulnar styloid process 26.1  ?Just proximal to ulnar styloid process 18  ?Across hand at thumb web space 20.6  ?At base of 2nd digit 6.5  ?(Blank rows = not tested) ?  ?  ?QUICK DASH SURVEY:  ?  [name] - 03/27/22 0001   ?  ?  Open a tight or new jar No difficulty   ?  Do heavy household chores (wash walls, wash floors) Mild difficulty   ?  Carry a shopping bag or briefcase Mild difficulty   ?  Wash your back Moderate difficulty   ?  Use a knife to cut food Mild difficulty   ?  Recreational activities in which you take some force or impact through your arm, shoulder, or hand (golf, hammering, tennis) Moderate  difficulty   ?  During the past week, to what extent has your arm, shoulder or hand problem interfered with your normal social activities with family, friends, neighbors, or groups? Slightly   ?  During the past week, to what extent has your arm, shoulder or hand problem limited your work or other regular daily activities Slightly   ?  Arm, shoulder, or hand pain. Mild   ?  Tingling (pins and needles) in your arm, shoulder, or hand Mild   ?  Difficulty Sleeping Mild difficulty   ?  DASH Score  27.27 %   ?  ?   ?  ?  ?   ?  ?  ?  ?  ?TREATMENT  ? ?04/25/22: In supine: Short neck, 5 diaphragmatic breaths, bil axillary nodes and establishment of interaxillary pathway, R inguinal nodes and establishment of axillo inguinal pathway, R breast moving fluid towards pathway then retracing all steps. Session ended 15 min early so pt could receive a trial of the FlexiTouch compression pump.  ? ?04/18/22: In supine: Short neck, 5 diaphragmatic breaths, bil axillary nodes and establishment of interaxillary pathway, R inguinal nodes and establishment of axillo inguinal pathway, R breast moving fluid towards pathway then retracing all steps  ? ?04/16/22: In supine: Short neck, 5 diaphragmatic breaths, bil axillary nodes and establishment of interaxillary pathway, R inguinal nodes and establishment of axillo inguinal pathway, R breast moving fluid towards pathway then in left sidelying Rt lateral breast and posterior interaxillary work and the retracing all steps back in supine. ? ?04/12/22: In supine: Short neck, 5 diaphragmatic breaths, left axillary nodes and establishment of interaxillary pathway, R inguinal nodes and establishment of axillo inguinal pathway, R breast moving fluid towards pathway then retracing all steps. Issued info to pt to obtain a solaris swell spot for her bra ? ?04/10/22: In supine: Short neck, 5 diaphragmatic breaths, left axillary nodes and establishment of interaxillary pathway, R inguinal nodes and  establishment of axillo inguinal pathway, R breast moving fluid towards pathway then retracing all steps. Had pt return demonstrate correct technique for self MLD and gave cues to use entire hand instead of finger tip

## 2022-04-30 ENCOUNTER — Encounter: Payer: Managed Care, Other (non HMO) | Admitting: Rehabilitation

## 2022-04-30 NOTE — Therapy (Incomplete)
?OUTPATIENT PHYSICAL THERAPY TREATMENT NOTE ? ? ?Patient Name: Diane Miranda ?MRN: [id number] ?DOB:06-28-1976, 46 y.o., female ?Today's Date: 04/30/2022 ? ?PCP: [name], MD ?REFERRING PROVIDER: [name], MD ? ?END OF SESSION:  ? ? ? ?Past Medical History:  ?Diagnosis Date  ? Allergy 2000  ? Asthma   ? as child  ? Breast cancer ([place])   ? Family history of lung cancer 03/08/2021  ? Family history of uterine cancer 03/08/2021  ? GERD (gastroesophageal reflux disease)   ? Headache   ? History of COVID-19   ? History of radiation therapy   ? Right breast- 10/09/21-11/29/21- Dr. Gery Pray  ? Hyperlipidemia   ? Hypertension   ? Pre-diabetes   ? Prediabetes   ? right breast ca dx'd 02/2021  ? Trimalleolar fracture of ankle, closed, left, initial encounter   ? ?Past Surgical History:  ?Procedure Laterality Date  ? BREAST BIOPSY Right 03/22/2021  ? Procedure: PUNCH BIOPSY OF RIGHT AREOLA;  Surgeon: Stark Klein, MD;  Location: [place];  Service: General;  Laterality: Right;  ? BREAST LUMPECTOMY WITH RADIOACTIVE SEED AND SENTINEL LYMPH NODE BIOPSY Right 08/29/2021  ? Procedure: RIGHT BREAST LUMPECTOMY WITH RADIOACTIVE SEED AND RIGHT AXILLARY SENTINEL LYMPH NODE BIOPSY;  Surgeon: Stark Klein, MD;  Location: [place];  Service: General;  Laterality: Right;  ? FRACTURE SURGERY  2018  ? NO PAST SURGERIES    ? ORIF ANKLE FRACTURE Left 07/10/2017  ? Procedure: OPEN REDUCTION INTERNAL FIXATION (ORIF) trimallolar ANKLE FRACTURE;  Surgeon: Wylene Simmer, MD;  Location: [place];  Service: Orthopedics;  Laterality: Left;  ? PORTACATH PLACEMENT Left 03/22/2021  ? Procedure: INSERTION PORT-A-CATH;  Surgeon: Stark Klein, MD;  Location: [place];  Service: General;  Laterality: Left;  ? RADIOACTIVE SEED GUIDED AXILLARY SENTINEL LYMPH NODE Right 08/29/2021  ? Procedure: RADIOACTIVE SEED GUIDED AXILLARY SENTINEL LYMPH NODE EXCISION;  Surgeon: Stark Klein, MD;   Location: [place];  Service: General;  Laterality: Right;  ? ?Patient Active Problem List  ? Diagnosis Date Noted  ? Port-A-Cath in place 09/11/2021  ? History of COVID-19   ? Genetic testing 03/27/2021  ? Family history of uterine cancer 03/08/2021  ? Family history of lung cancer 03/08/2021  ? Malignant neoplasm of upper-outer quadrant of right breast in female, estrogen receptor positive ([place]) 03/01/2021  ? SINUSITIS - ACUTE-NOS 09/07/2009  ? EXUDATIVE PHARYNGITIS 09/07/2009  ? LOW HDL 08/03/2008  ? 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 OBESITY 08/01/2008  ? ALLERGIC RHINITIS 08/01/2008  ? VAGINITIS 08/01/2008  ? HEADACHE 08/01/2008  ? ? ?REFERRING DIAG: C50.411,Z17.0 (ICD-10-CM) - Malignant neoplasm of upper-outer quadrant of right breast in female, estrogen receptor positive ([place])  ? ?THERAPY DIAG:  ?No diagnosis found. ? ?PERTINENT HISTORY: Diagnosed on 03/07/2021 with R breast cancer stage IIB (ER/PR+, HER2+), underwent a R lumpectomy and SLNB (0/5) on  08/29/21, she completed neoadjuvant chemo, and adjuvant radiation and recently completed chemo, declined antiestrogen therapy ? ?PRECAUTIONS: Other: at risk for lymphedema R UE ? ?SUBJECTIVE: The swelling is definitely doing better. I ordered the breast pad online.  ? ?PAIN:  ?Are you having pain? No ? ? ?OBJECTIVE: (objective measures completed at initial evaluation unless otherwise dated) ? ? ?COGNITION: ?           Overall cognitive status: Within functional limits for tasks assessed  ?  ?PALPATION: Increased fibrosis in inferior R breast and medial inferior breast ?  ?OBSERVATIONS / OTHER ASSESSMENTS: fullness  noted in R breast ?  ?  ?POSTURE: rounded shoulders, forward head ?  ?UPPER EXTREMITY AROM/PROM: ?  ?A/PROM RIGHT  03/27/2022 ?   ?Shoulder extension 68  ?Shoulder flexion 168  ?Shoulder abduction 170  ?Shoulder internal rotation 46  ?Shoulder external rotation 78  ?                        (Blank rows = not tested) ?  ?A/PROM LEFT  03/27/2022  ?Shoulder extension 62   ?Shoulder flexion 170  ?Shoulder abduction 175  ?Shoulder internal rotation 65  ?Shoulder external rotation 84  ?                        (Blank rows = not tested) ?  ?  ?LYMPHEDEMA ASSESSMENTS:  ?  ?SURGERY TYPE/DATE: R lumpectomy and SLNB (0/5) on 08/29/21 ?  ?NUMBER OF LYMPH NODES REMOVED: 5 all negative ?  ?CHEMOTHERAPY: completed neoadjuvant in 2022 ?  ?RADIATION:completed ?  ?HORMONE TREATMENT: declined ?  ?INFECTIONS: none reported ?  ?LYMPHEDEMA ASSESSMENTS:  ?  ?[place] RIGHT  03/27/2022  ?10 cm proximal to olecranon process 41  ?Olecranon process 30.5  ?10 cm proximal to ulnar styloid process 26.6  ?Just proximal to ulnar styloid process 18.5  ?Across hand at thumb web space 20  ?At base of 2nd digit 6.7  ?(Blank rows = not tested) ?  ?[place] LEFT  03/27/2022  ?10 cm proximal to olecranon process 41.8  ?Olecranon process 30.7  ?10 cm proximal to ulnar styloid process 26.1  ?Just proximal to ulnar styloid process 18  ?Across hand at thumb web space 20.6  ?At base of 2nd digit 6.5  ?(Blank rows = not tested) ?  ?  ?QUICK DASH SURVEY:  ?  [name] - 03/27/22 0001   ?  ?  Open a tight or new jar No difficulty   ?  Do heavy household chores (wash walls, wash floors) Mild difficulty   ?  Carry a shopping bag or briefcase Mild difficulty   ?  Wash your back Moderate difficulty   ?  Use a knife to cut food Mild difficulty   ?  Recreational activities in which you take some force or impact through your arm, shoulder, or hand (golf, hammering, tennis) Moderate difficulty   ?  During the past week, to what extent has your arm, shoulder or hand problem interfered with your normal social activities with family, friends, neighbors, or groups? Slightly   ?  During the past week, to what extent has your arm, shoulder or hand problem limited your work or other regular daily activities Slightly   ?  Arm, shoulder, or hand pain. Mild   ?  Tingling (pins and needles) in your arm, shoulder, or hand Mild   ?  Difficulty  Sleeping Mild difficulty   ?  DASH Score 27.27 %   ?  ?   ?  ?  ?   ?  ?  ?  ?  ?TREATMENT  ?04/30/22:In supine: Short neck, 5 diaphragmatic breaths, bil axillary nodes and establishment of interaxillary pathway, R inguinal nodes and establishment of axillo inguinal pathway, R breast moving fluid towards pathway then retracing all steps.  ? ?04/25/22: In supine: Short neck, 5 diaphragmatic breaths, bil axillary nodes and establishment of interaxillary pathway, R inguinal nodes and establishment of axillo inguinal pathway, R breast moving fluid towards pathway then retracing all steps. Session ended  15 min early so pt could receive a trial of the FlexiTouch compression pump.  ? ?04/18/22: In supine: Short neck, 5 diaphragmatic breaths, bil axillary nodes and establishment of interaxillary pathway, R inguinal nodes and establishment of axillo inguinal pathway, R breast moving fluid towards pathway then retracing all steps  ? ?04/16/22: In supine: Short neck, 5 diaphragmatic breaths, bil axillary nodes and establishment of interaxillary pathway, R inguinal nodes and establishment of axillo inguinal pathway, R breast moving fluid towards pathway then in left sidelying Rt lateral breast and posterior interaxillary work and the retracing all steps back in supine. ? ?04/12/22: In supine: Short neck, 5 diaphragmatic breaths, left axillary nodes and establishment of interaxillary pathway, R inguinal nodes and establishment of axillo inguinal pathway, R breast moving fluid towards pathway then retracing all steps. Issued info to pt to obtain a solaris swell spot for her bra ? ?04/10/22: In supine: Short neck, 5 diaphragmatic breaths, left axillary nodes and establishment of interaxillary pathway, R inguinal nodes and establishment of axillo inguinal pathway, R breast moving fluid towards pathway then retracing all steps. Had pt return demonstrate correct technique for self MLD and gave cues to use entire hand instead of finger tips  and also to slow down when doing self MLD (each skin stretch should last approx 1 sec) ? ?03/27/22: In supine: Began instructing in basic principles of manual lymphatic drainage and correct skin stretch technique. Short ne

## 2022-05-02 ENCOUNTER — Ambulatory Visit: Payer: No Typology Code available for payment source | Admitting: Physical Therapy

## 2022-05-02 ENCOUNTER — Encounter: Payer: Self-pay | Admitting: Physical Therapy

## 2022-05-02 DIAGNOSIS — I890 Lymphedema, not elsewhere classified: Secondary | ICD-10-CM | POA: Diagnosis not present

## 2022-05-02 DIAGNOSIS — R293 Abnormal posture: Secondary | ICD-10-CM

## 2022-05-02 DIAGNOSIS — Z170 Estrogen receptor positive status [ER+]: Secondary | ICD-10-CM

## 2022-05-02 NOTE — Therapy (Signed)
OUTPATIENT PHYSICAL THERAPY TREATMENT NOTE   Patient Name: Diane Miranda MRN: [id number] DOB:[birth year], 46 y.o., female Today's Date: 05/02/2022  PCP: Diane Docker, Miranda REFERRING PROVIDER: Benay Pike, Miranda  END OF SESSION:   PT End of Session - 05/02/22 1503     Visit Number 7    Number of Visits 14    Date for PT Re-Evaluation 05/23/22    PT Start Time 8469    PT Stop Time 1550    PT Time Calculation (min) 47 min    Activity Tolerance Patient tolerated treatment well    Behavior During Therapy Diane Miranda for tasks assessed/performed              Past Medical History:  Diagnosis Date   Allergy 2000   Asthma    as child   Breast cancer ([place])    Family history of lung cancer 03/08/2021   Family history of uterine cancer 03/08/2021   GERD (gastroesophageal reflux disease)    Headache    History of COVID-19    History of radiation therapy    Right breast- 10/09/21-11/29/21- Diane Miranda   Hyperlipidemia    Hypertension    Pre-diabetes    Prediabetes    right breast ca dx'd 02/2021   Trimalleolar fracture of ankle, closed, left, initial encounter    Past Surgical History:  Procedure Laterality Date   BREAST BIOPSY Right 03/22/2021   Procedure: PUNCH BIOPSY OF RIGHT AREOLA;  Surgeon: Diane Miranda;  Location: [place];  Service: General;  Laterality: Right;   BREAST LUMPECTOMY WITH RADIOACTIVE SEED AND SENTINEL LYMPH NODE BIOPSY Right 08/29/2021   Procedure: RIGHT BREAST LUMPECTOMY WITH RADIOACTIVE SEED AND RIGHT AXILLARY SENTINEL LYMPH NODE BIOPSY;  Surgeon: Diane Miranda;  Location: [place];  Service: General;  Laterality: Right;   FRACTURE SURGERY  2018   NO PAST SURGERIES     ORIF ANKLE FRACTURE Left 07/10/2017   Procedure: OPEN REDUCTION INTERNAL FIXATION (ORIF) trimallolar ANKLE FRACTURE;  Surgeon: Diane Miranda;  Location: [place];  Service: Orthopedics;  Laterality: Left;   PORTACATH PLACEMENT Left  03/22/2021   Procedure: INSERTION PORT-A-CATH;  Surgeon: Diane Miranda;  Location: [place];  Service: General;  Laterality: Left;   RADIOACTIVE SEED GUIDED AXILLARY SENTINEL LYMPH NODE Right 08/29/2021   Procedure: RADIOACTIVE SEED GUIDED AXILLARY SENTINEL LYMPH NODE EXCISION;  Surgeon: Diane Miranda;  Location: [place];  Service: General;  Laterality: Right;   Patient Active Problem List   Diagnosis Date Noted   Port-A-Cath in place 09/11/2021   History of COVID-19    Genetic testing 03/27/2021   Family history of uterine cancer 03/08/2021   Family history of lung cancer 03/08/2021   Malignant neoplasm of upper-outer quadrant of right breast in female, estrogen receptor positive ([place]) 03/01/2021   SINUSITIS - ACUTE-NOS 09/07/2009   EXUDATIVE PHARYNGITIS 09/07/2009   LOW HDL 08/03/2008   HSV 08/01/2008   OBESITY 08/01/2008   ALLERGIC RHINITIS 08/01/2008   VAGINITIS 08/01/2008   HEADACHE 08/01/2008    REFERRING DIAG: C50.411,Z17.0 (ICD-10-CM) - Malignant neoplasm of upper-outer quadrant of right breast in female, estrogen receptor positive ([place])   THERAPY DIAG:  Lymphedema, not elsewhere classified  Abnormal posture  Malignant neoplasm of upper-outer quadrant of right breast in female, estrogen receptor positive ([place])  PERTINENT HISTORY: Diagnosed on 03/07/2021 with R breast cancer stage IIB (ER/PR+, HER2+), underwent a R lumpectomy and SLNB (0/5) on  08/29/21, she completed  neoadjuvant chemo, and adjuvant radiation and recently completed chemo, declined antiestrogen therapy  PRECAUTIONS: Other: at risk for lymphedema R UE  SUBJECTIVE: I got the swell spot on Tuesday. I have not tried it yet. I will try it tomorrow.   PAIN:  Are you having pain? No   OBJECTIVE: (objective measures completed at initial evaluation unless otherwise dated)   COGNITION:            Overall cognitive status: Within functional limits for tasks assessed    PALPATION: Increased  fibrosis in inferior R breast and medial inferior breast   OBSERVATIONS / OTHER ASSESSMENTS: fullness noted in R breast     POSTURE: rounded shoulders, forward head   UPPER EXTREMITY AROM/PROM:   A/PROM RIGHT  03/27/2022    Shoulder extension 68  Shoulder flexion 168  Shoulder abduction 170  Shoulder internal rotation 46  Shoulder external rotation 78                          (Blank rows = not tested)   A/PROM LEFT  03/27/2022  Shoulder extension 62  Shoulder flexion 170  Shoulder abduction 175  Shoulder internal rotation 65  Shoulder external rotation 84                          (Blank rows = not tested)     LYMPHEDEMA ASSESSMENTS:    SURGERY TYPE/DATE: R lumpectomy and SLNB (0/5) on 08/29/21   NUMBER OF LYMPH NODES REMOVED: 5 all negative   CHEMOTHERAPY: completed neoadjuvant in 2022   RADIATION:completed   HORMONE TREATMENT: declined   INFECTIONS: none reported   LYMPHEDEMA ASSESSMENTS:    LANDMARK RIGHT  03/27/2022  10 cm proximal to olecranon process 41  Olecranon process 30.5  10 cm proximal to ulnar styloid process 26.6  Just proximal to ulnar styloid process 18.5  Across hand at thumb web space 20  At base of 2nd digit 6.7  (Blank rows = not tested)   LANDMARK LEFT  03/27/2022  10 cm proximal to olecranon process 41.8  Olecranon process 30.7  10 cm proximal to ulnar styloid process 26.1  Just proximal to ulnar styloid process 18  Across hand at thumb web space 20.6  At base of 2nd digit 6.5  (Blank rows = not tested)     QUICK DASH SURVEY:    Diane Miranda - 03/27/22 0001       Open a tight or new jar No difficulty     Do heavy household chores (wash walls, wash floors) Mild difficulty     Carry a shopping bag or briefcase Mild difficulty     Wash your back Moderate difficulty     Use a knife to cut food Mild difficulty     Recreational activities in which you take some force or impact through your arm, shoulder, or hand (golf, hammering, tennis)  Moderate difficulty     During the past week, to what extent has your arm, shoulder or hand problem interfered with your normal social activities with family, friends, neighbors, or groups? Slightly     During the past week, to what extent has your arm, shoulder or hand problem limited your work or other regular daily activities Slightly     Arm, shoulder, or hand pain. Mild     Tingling (pins and needles) in your arm, shoulder, or hand Mild     Difficulty Sleeping Mild  difficulty     DASH Score 27.27 %                       TREATMENT   05/02/22: In supine: Short neck, 5 diaphragmatic breaths, bil axillary nodes and establishment of interaxillary pathway, R inguinal nodes and establishment of axillo inguinal pathway, R breast moving fluid towards pathway then in left sidelying Rt lateral breast and posterior interaxillary work and the retracing all steps back in supine.  04/25/22: In supine: Short neck, 5 diaphragmatic breaths, bil axillary nodes and establishment of interaxillary pathway, R inguinal nodes and establishment of axillo inguinal pathway, R breast moving fluid towards pathway then retracing all steps. Session ended 15 min early so pt could receive a trial of the FlexiTouch compression pump.   04/18/22: In supine: Short neck, 5 diaphragmatic breaths, bil axillary nodes and establishment of interaxillary pathway, R inguinal nodes and establishment of axillo inguinal pathway, R breast moving fluid towards pathway then retracing all steps   04/16/22: In supine: Short neck, 5 diaphragmatic breaths, bil axillary nodes and establishment of interaxillary pathway, R inguinal nodes and establishment of axillo inguinal pathway, R breast moving fluid towards pathway then in left sidelying Rt lateral breast and posterior interaxillary work and the retracing all steps back in supine.  04/12/22: In supine: Short neck, 5 diaphragmatic breaths, left axillary nodes and establishment of interaxillary  pathway, R inguinal nodes and establishment of axillo inguinal pathway, R breast moving fluid towards pathway then retracing all steps. Issued info to pt to obtain a solaris swell spot for her bra  04/10/22: In supine: Short neck, 5 diaphragmatic breaths, left axillary nodes and establishment of interaxillary pathway, R inguinal nodes and establishment of axillo inguinal pathway, R breast moving fluid towards pathway then retracing all steps. Had pt return demonstrate correct technique for self MLD and gave cues to use entire hand instead of finger tips and also to slow down when doing self MLD (each skin stretch should last approx 1 sec)  03/27/22: In supine: Began instructing in basic principles of manual lymphatic drainage and correct skin stretch technique. Short neck, 5 diaphragmatic breaths, left axillary nodes and establishment of interaxillary pathway, R inguinal nodes and establishment of axillo inguinal pathway, R breast moving fluid towards pathway then retracing all steps. Had pt return demonstrate correct technique for self MLD. Edema management: created foam chip pack for pt to wear in bra on inferior breast to soften fibrosis     PATIENT EDUCATION:  Education details: anatomy and physiology of the lymphatic system, need for compression and chip pack in compression bra, basic principles and sequence of self MLD Person educated: Patient Education method: Customer service manager Education comprehension: verbalized understanding and returned demonstration     HOME EXERCISE PROGRAM: Self MLD at least once daily for 20 min   ASSESSMENT:   CLINICAL IMPRESSION: Continued with MLD to R breast today with focus on inferior lateral and inferior medial breast where fibrosis is worse. She received a trial of the FlexiTouch compression pump at the end of her session last week. Pt has failed four weeks of conservative therapy which has included compression, exercise and elevation.  An E0651  "entre plus" basic pump was demonstrated and failed on 5/11 due to pain and no reduction in swelling. Pt continues to have fibrosis present in inferior medial breast and also reports intermittent pain in L breast. A FlexiTouch would assist her with independent at home management of her lymphedema.  OBJECTIVE IMPAIRMENTS increased edema, postural dysfunction, and pain.    ACTIVITY LIMITATIONS None.    PERSONAL FACTORS None are also affecting patient's functional outcome.      REHAB POTENTIAL: Good   CLINICAL DECISION MAKING: Stable/uncomplicated   EVALUATION COMPLEXITY: Low   GOALS: Goals reviewed with patient? Yes   SHORT TERM GOALS: Target date: 04/10/2022   Pt will be independent in self MLD for long term management of lymphedema. Baseline: Goal status: MET 04/25/22; 04/25/22- pt feels independent with this   LONG TERM GOALS: Target date: 04/24/2022   Pt will receive a trial of the FlexiTouch compression pump for long term management of lymphedema. Baseline:  Goal status: MET 04/25/22- pt received trial today   2.  Pt will report a 75% improvement in swelling and fibrosis in R breast to allow decreased pain and discomfort. Baseline:  Goal status: ONGOING 04/25/22- 40% improvement   3.  Pt will obtain appropriate compression garments for long term management of lymphedema such as a breast swell spot. Baseline:  Goal status: ONGOING 04/25/22- pt has ordered the swell spot but it has not come in yet     PLAN: PT FREQUENCY: 2x/week   PT DURATION: 4 weeks   PLANNED INTERVENTIONS: Therapeutic exercises, Therapeutic activity, Patient/Family education, Orthotic/Fit training, Manual lymph drainage, Compression bandaging, Taping, Vasopneumatic device, and Manual therapy   PLAN FOR NEXT SESSION: have pt return demonstrate self MLD, did she try swell spot over the weekend? , cont MLD to R breast, hear from [facility] after note sent on 05/02/22     Manus Gunning,  PT 05/02/2022, 3:52 PM

## 2022-05-21 ENCOUNTER — Ambulatory Visit: Payer: No Typology Code available for payment source | Attending: Hematology and Oncology | Admitting: Physical Therapy

## 2022-05-21 ENCOUNTER — Other Ambulatory Visit: Payer: Self-pay

## 2022-05-21 ENCOUNTER — Encounter: Payer: Self-pay | Admitting: Physical Therapy

## 2022-05-21 DIAGNOSIS — R293 Abnormal posture: Secondary | ICD-10-CM | POA: Insufficient documentation

## 2022-05-21 DIAGNOSIS — I890 Lymphedema, not elsewhere classified: Secondary | ICD-10-CM | POA: Diagnosis present

## 2022-05-21 DIAGNOSIS — I10 Essential (primary) hypertension: Secondary | ICD-10-CM

## 2022-05-21 DIAGNOSIS — C50411 Malignant neoplasm of upper-outer quadrant of right female breast: Secondary | ICD-10-CM | POA: Diagnosis present

## 2022-05-21 DIAGNOSIS — Z170 Estrogen receptor positive status [ER+]: Secondary | ICD-10-CM | POA: Diagnosis present

## 2022-05-21 MED ORDER — LOSARTAN POTASSIUM 50 MG PO TABS: 50.0000 mg | ORAL_TABLET | Freq: Every evening | ORAL | 1 refills | Status: DC

## 2022-05-21 NOTE — Therapy (Addendum)
OUTPATIENT PHYSICAL THERAPY TREATMENT NOTE   Patient Name: Diane Miranda MRN: [id number] DOB:1975-12-25, 46 y.o., female Today's Date: 05/21/2022  PCP: Diane Docker, MD REFERRING PROVIDER: [name], MD  END OF SESSION:   PT End of Session - 05/21/22 1507     Visit Number 8    Number of Visits 14    Date for PT Re-Evaluation 05/23/22    PT Start Time 6295    Activity Tolerance Patient tolerated treatment well    Behavior During Therapy [facility] for tasks assessed/performed              Past Medical History:  Diagnosis Date   Allergy 2000   Asthma    as child   Breast cancer ([place])    Family history of lung cancer 03/08/2021   Family history of uterine cancer 03/08/2021   GERD (gastroesophageal reflux disease)    Headache    History of COVID-19    History of radiation therapy    Right breast- 10/09/21-11/29/21- Dr. Gery Miranda   Hyperlipidemia    Hypertension    Pre-diabetes    Prediabetes    right breast ca dx'd 02/2021   Trimalleolar fracture of ankle, closed, left, initial encounter    Past Surgical History:  Procedure Laterality Date   BREAST BIOPSY Right 03/22/2021   Procedure: PUNCH BIOPSY OF RIGHT AREOLA;  Surgeon: Diane Klein, MD;  Location: [place];  Service: General;  Laterality: Right;   BREAST LUMPECTOMY WITH RADIOACTIVE SEED AND SENTINEL LYMPH NODE BIOPSY Right 08/29/2021   Procedure: RIGHT BREAST LUMPECTOMY WITH RADIOACTIVE SEED AND RIGHT AXILLARY SENTINEL LYMPH NODE BIOPSY;  Surgeon: Diane Klein, MD;  Location: [place];  Service: General;  Laterality: Right;   FRACTURE SURGERY  2018   NO PAST SURGERIES     ORIF ANKLE FRACTURE Left 07/10/2017   Procedure: OPEN REDUCTION INTERNAL FIXATION (ORIF) trimallolar ANKLE FRACTURE;  Surgeon: Diane Simmer, MD;  Location: [place];  Service: Orthopedics;  Laterality: Left;   PORTACATH PLACEMENT Left 03/22/2021   Procedure: INSERTION PORT-A-CATH;  Surgeon:  Diane Klein, MD;  Location: [place];  Service: General;  Laterality: Left;   RADIOACTIVE SEED GUIDED AXILLARY SENTINEL LYMPH NODE Right 08/29/2021   Procedure: RADIOACTIVE SEED GUIDED AXILLARY SENTINEL LYMPH NODE EXCISION;  Surgeon: Diane Klein, MD;  Location: [place];  Service: General;  Laterality: Right;   Patient Active Problem List   Diagnosis Date Noted   Port-A-Cath in place 09/11/2021   History of COVID-19    Genetic testing 03/27/2021   Family history of uterine cancer 03/08/2021   Family history of lung cancer 03/08/2021   Malignant neoplasm of upper-outer quadrant of right breast in female, estrogen receptor positive ([place]) 03/01/2021   SINUSITIS - ACUTE-NOS 09/07/2009   EXUDATIVE PHARYNGITIS 09/07/2009   LOW HDL 08/03/2008   HSV 08/01/2008   OBESITY 08/01/2008   ALLERGIC RHINITIS 08/01/2008   VAGINITIS 08/01/2008   HEADACHE 08/01/2008    REFERRING DIAG: C50.411,Z17.0 (ICD-10-CM) - Malignant neoplasm of upper-outer quadrant of right breast in female, estrogen receptor positive ([place])   THERAPY DIAG:  Lymphedema, not elsewhere classified  Abnormal posture  Malignant neoplasm of upper-outer quadrant of right breast in female, estrogen receptor positive ([place])  PERTINENT HISTORY: Diagnosed on 03/07/2021 with R breast cancer stage IIB (ER/PR+, HER2+), underwent a R lumpectomy and SLNB (0/5) on  08/29/21, she completed neoadjuvant chemo, and adjuvant radiation and recently completed chemo, declined antiestrogen therapy  PRECAUTIONS: Other:  at risk for lymphedema R UE  SUBJECTIVE: I got the pump and I have been using it the past 2 days. I have been using the swell spot and I can tell a difference.   PAIN:  Are you having pain? No   OBJECTIVE: (objective measures completed at initial evaluation unless otherwise dated)   COGNITION:            Overall cognitive status: Within functional limits for tasks assessed    PALPATION: Increased fibrosis in  inferior R breast and medial inferior breast   OBSERVATIONS / OTHER ASSESSMENTS: fullness noted in R breast     POSTURE: rounded shoulders, forward head   UPPER EXTREMITY AROM/PROM:   A/PROM RIGHT  03/27/2022    Shoulder extension 68  Shoulder flexion 168  Shoulder abduction 170  Shoulder internal rotation 46  Shoulder external rotation 78                          (Blank rows = not tested)   A/PROM LEFT  03/27/2022  Shoulder extension 62  Shoulder flexion 170  Shoulder abduction 175  Shoulder internal rotation 65  Shoulder external rotation 84                          (Blank rows = not tested)     LYMPHEDEMA ASSESSMENTS:    SURGERY TYPE/DATE: R lumpectomy and SLNB (0/5) on 08/29/21   NUMBER OF LYMPH NODES REMOVED: 5 all negative   CHEMOTHERAPY: completed neoadjuvant in 2022   RADIATION:completed   HORMONE TREATMENT: declined   INFECTIONS: none reported   LYMPHEDEMA ASSESSMENTS:    LANDMARK RIGHT  03/27/2022  10 cm proximal to olecranon process 41  Olecranon process 30.5  10 cm proximal to ulnar styloid process 26.6  Just proximal to ulnar styloid process 18.5  Across hand at thumb web space 20  At base of 2nd digit 6.7  (Blank rows = not tested)   LANDMARK LEFT  03/27/2022  10 cm proximal to olecranon process 41.8  Olecranon process 30.7  10 cm proximal to ulnar styloid process 26.1  Just proximal to ulnar styloid process 18  Across hand at thumb web space 20.6  At base of 2nd digit 6.5  (Blank rows = not tested)     QUICK DASH SURVEY:    Diane Miranda - 03/27/22 0001       Open a tight or new jar No difficulty     Do heavy household chores (wash walls, wash floors) Mild difficulty     Carry a shopping bag or briefcase Mild difficulty     Wash your back Moderate difficulty     Use a knife to cut food Mild difficulty     Recreational activities in which you take some force or impact through your arm, shoulder, or hand (golf, hammering, tennis) Moderate  difficulty     During the past week, to what extent has your arm, shoulder or hand problem interfered with your normal social activities with family, friends, neighbors, or groups? Slightly     During the past week, to what extent has your arm, shoulder or hand problem limited your work or other regular daily activities Slightly     Arm, shoulder, or hand pain. Mild     Tingling (pins and needles) in your arm, shoulder, or hand Mild     Difficulty Sleeping Mild difficulty     DASH  Score 27.27 %                       TREATMENT   05/21/22: In supine: Short neck, 5 diaphragmatic breaths, bil axillary nodes and establishment of interaxillary pathway, R inguinal nodes and establishment of axillo inguinal pathway, R breast moving fluid towards pathway then retracing all steps   05/02/22: In supine: Short neck, 5 diaphragmatic breaths, bil axillary nodes and establishment of interaxillary pathway, R inguinal nodes and establishment of axillo inguinal pathway, R breast moving fluid towards pathway then in left sidelying Rt lateral breast and posterior interaxillary work and the retracing all steps back in supine.  04/25/22: In supine: Short neck, 5 diaphragmatic breaths, bil axillary nodes and establishment of interaxillary pathway, R inguinal nodes and establishment of axillo inguinal pathway, R breast moving fluid towards pathway then retracing all steps. Session ended 15 min early so pt could receive a trial of the FlexiTouch compression pump.     PATIENT EDUCATION:  Education details: anatomy and physiology of the lymphatic system, need for compression and chip pack in compression bra, basic principles and sequence of self MLD Person educated: Patient Education method: Customer service manager Education comprehension: verbalized understanding and returned demonstration     HOME EXERCISE PROGRAM: Self MLD at least once daily for 20 min   ASSESSMENT:   CLINICAL IMPRESSION: Pt has  received the FlexiTouch compression pump and has been using it daily since she received it. She has also been wearing the swell spot in her bra and reports she can tell a big improvement in her swelling since she began doing those two things. Continued with MLD to R breast today with improvements noted in fibrosis in R breast. Will place pt on hold for a month and then re check to see if she is able to independently manage her lymphedema.      OBJECTIVE IMPAIRMENTS increased edema, postural dysfunction, and pain.    ACTIVITY LIMITATIONS None.    PERSONAL FACTORS None are also affecting patient's functional outcome.      REHAB POTENTIAL: Good   CLINICAL DECISION MAKING: Stable/uncomplicated   EVALUATION COMPLEXITY: Low   GOALS: Goals reviewed with patient? Yes   SHORT TERM GOALS: Target date: 04/10/2022   Pt will be independent in self MLD for long term management of lymphedema. Baseline: Goal status: MET 04/25/22; 04/25/22- pt feels independent with this   LONG TERM GOALS: Target date: 04/24/2022   Pt will receive a trial of the FlexiTouch compression pump for long term management of lymphedema. Baseline:  Goal status: MET 04/25/22- pt received trial today   2.  Pt will report a 75% improvement in swelling and fibrosis in R breast to allow decreased pain and discomfort. Baseline:  Goal status: ONGOING 04/25/22- 40% improvement   3.  Pt will obtain appropriate compression garments for long term management of lymphedema such as a breast swell spot. Baseline:  Goal status: MET on 6/6: 04/25/22- pt has ordered the swell spot but it has not come in yet; 6/6 pt received swell spot and has been wearing it     PLAN: PT FREQUENCY: 2x/week   PT DURATION: 4 weeks   PLANNED INTERVENTIONS: Therapeutic exercises, Therapeutic activity, Patient/Family education, Orthotic/Fit training, Manual lymph drainage, Compression bandaging, Taping, Vasopneumatic device, and Manual therapy   PLAN FOR  NEXT SESSION: place on hold for a week then recheck swelling and see if pt was able to independently manage her lymphedema  Allyson Sabal [place], PT 05/21/2022, 3:09 PM    PHYSICAL THERAPY DISCHARGE SUMMARY  Visits from Start of Care: 8  Current functional level related to goals / functional outcomes: See above - most goals met   Remaining deficits: See above   Education / Equipment: MLD, compression garment   Patient agrees to discharge. Patient goals were partially met. Patient is being discharged due to not returning since the last visit.  Allyson Sabal [place], [place] 08/14/22 2:38 PM

## 2022-05-23 ENCOUNTER — Encounter: Payer: No Typology Code available for payment source | Admitting: Physical Therapy

## 2022-05-27 ENCOUNTER — Encounter: Payer: Self-pay | Admitting: Hematology and Oncology

## 2022-05-27 ENCOUNTER — Other Ambulatory Visit: Payer: Self-pay

## 2022-05-27 DIAGNOSIS — I10 Essential (primary) hypertension: Secondary | ICD-10-CM

## 2022-06-25 ENCOUNTER — Encounter (HOSPITAL_COMMUNITY): Payer: Self-pay | Admitting: General Surgery

## 2022-06-25 NOTE — Progress Notes (Signed)
PCP - Dr. Alphonzo Grieve  Cardiologist - Dr. Terri Skains EKG - 11/22/21 Chest x-ray -  ECHO -  Cardiac Cath -  CPAP -   Blood Thinner Instructions:  Aspirin Instructions:  ERAS Protcol - 0800 COVID TEST- n/a  Anesthesia review: n/a  -------------  SDW INSTRUCTIONS:  Your procedure is scheduled on 7/12. Please report to [facility] Main Entrance "A" at 0830 A.M., and check in at the Admitting office. Call this number if you have problems the morning of surgery: [phone]   Remember: Do not eat after midnight the night before your surgery  You may drink clear liquids until 0800 the morning of your surgery.   Clear liquids allowed are: Water, Non-Citrus Juices (without pulp), Carbonated Beverages, Clear Tea, Black Coffee Only, and Gatorade   Medications to take morning of surgery with a sip of water include: metoprolol succinate (TOPROL-XL)    As of today, STOP taking any Aspirin (unless otherwise instructed by your surgeon), Aleve, Naproxen, Ibuprofen, Motrin, Advil, Goody's, BC's, all herbal medications, fish oil, and all vitamins.    The Morning of Surgery Do not wear jewelry, make-up or nail polish. Do not wear lotions, powders, or perfumes/colognes, or deodorant Do not bring valuables to the hospital. [facility] is not responsible for any belongings or valuables.  If you are a smoker, DO NOT Smoke 24 hours prior to surgery  If you wear a CPAP at night please bring your mask the morning of surgery   Remember that you must have someone to transport you home after your surgery, and remain with you for 24 hours if you are discharged the same day.  Please bring cases for contacts, glasses, hearing aids, dentures or bridgework because it cannot be worn into surgery.   Patients discharged the day of surgery will not be allowed to drive home.   Please shower the NIGHT BEFORE/MORNING OF SURGERY (use antibacterial soap like DIAL soap if possible). Wear comfortable clothes the morning  of surgery. Oral Hygiene is also important to reduce your risk of infection.  Remember - BRUSH YOUR TEETH THE MORNING OF SURGERY WITH YOUR REGULAR TOOTHPASTE  Patient denies shortness of breath, fever, cough and chest pain.

## 2022-06-26 ENCOUNTER — Encounter (HOSPITAL_COMMUNITY)
Admission: RE | Disposition: A | Discharge status: Home / Self Care | Payer: Self-pay | Source: Ambulatory Visit | Attending: General Surgery

## 2022-06-26 ENCOUNTER — Encounter (HOSPITAL_COMMUNITY): Payer: Self-pay | Admitting: General Surgery

## 2022-06-26 ENCOUNTER — Other Ambulatory Visit: Payer: Self-pay

## 2022-06-26 ENCOUNTER — Ambulatory Visit (HOSPITAL_BASED_OUTPATIENT_CLINIC_OR_DEPARTMENT_OTHER): Payer: No Typology Code available for payment source | Admitting: Anesthesiology

## 2022-06-26 ENCOUNTER — Ambulatory Visit (HOSPITAL_COMMUNITY)
Admission: RE | Admit: 2022-06-26 | Discharge: 2022-06-26 | Disposition: A | Discharge status: Home / Self Care | Payer: No Typology Code available for payment source | Source: Ambulatory Visit | Attending: General Surgery | Admitting: General Surgery

## 2022-06-26 ENCOUNTER — Ambulatory Visit (HOSPITAL_COMMUNITY): Payer: No Typology Code available for payment source | Admitting: Anesthesiology

## 2022-06-26 DIAGNOSIS — C50911 Malignant neoplasm of unspecified site of right female breast: Secondary | ICD-10-CM | POA: Diagnosis not present

## 2022-06-26 DIAGNOSIS — Z923 Personal history of irradiation: Secondary | ICD-10-CM | POA: Diagnosis not present

## 2022-06-26 DIAGNOSIS — Z6838 Body mass index (BMI) 38.0-38.9, adult: Secondary | ICD-10-CM

## 2022-06-26 DIAGNOSIS — Z6836 Body mass index (BMI) 36.0-36.9, adult: Secondary | ICD-10-CM | POA: Insufficient documentation

## 2022-06-26 DIAGNOSIS — Z853 Personal history of malignant neoplasm of breast: Secondary | ICD-10-CM | POA: Insufficient documentation

## 2022-06-26 DIAGNOSIS — Z452 Encounter for adjustment and management of vascular access device: Secondary | ICD-10-CM

## 2022-06-26 DIAGNOSIS — I10 Essential (primary) hypertension: Secondary | ICD-10-CM

## 2022-06-26 HISTORY — PX: PORT-A-CATH REMOVAL: SHX5289

## 2022-06-26 LAB — CBC
HCT: 39.3 % (ref 36.0–46.0)
Hemoglobin: 13.2 g/dL (ref 12.0–15.0)
MCH: 31.1 pg (ref 26.0–34.0)
MCHC: 33.6 g/dL (ref 30.0–36.0)
MCV: 92.5 fL (ref 80.0–100.0)
Platelets: 257 10*3/uL (ref 150–400)
RBC: 4.25 MIL/uL (ref 3.87–5.11)
RDW: 14.3 % (ref 11.5–15.5)
WBC: 6.4 10*3/uL (ref 4.0–10.5)
nRBC: 0 % (ref 0.0–0.2)

## 2022-06-26 LAB — BASIC METABOLIC PANEL
Anion gap: 6 (ref 5–15)
BUN: 7 mg/dL (ref 6–20)
CO2: 23 mmol/L (ref 22–32)
Calcium: 8.5 mg/dL — ABNORMAL LOW (ref 8.9–10.3)
Chloride: 110 mmol/L (ref 98–111)
Creatinine, Ser: 1 mg/dL (ref 0.44–1.00)
GFR, Estimated: >60 mL/min (ref >60)
Glucose, Bld: 121 mg/dL — ABNORMAL HIGH (ref 70–99)
Potassium: 4 mmol/L (ref 3.5–5.1)
Sodium: 139 mmol/L (ref 135–145)

## 2022-06-26 LAB — POCT PREGNANCY, URINE: Preg Test, Ur: NEGATIVE

## 2022-06-26 LAB — NO BLOOD PRODUCTS

## 2022-06-26 SURGERY — REMOVAL PORT-A-CATH
Anesthesia: General

## 2022-06-26 MED ORDER — CEFAZOLIN SODIUM-DEXTROSE 2-4 GM/100ML-% IV SOLN
2.0000 g | INTRAVENOUS | Status: AC
  Administered 2022-06-26: 2 g via INTRAVENOUS
  Filled 2022-06-26: qty 100

## 2022-06-26 MED ORDER — CHLORHEXIDINE GLUCONATE 0.12 % MT SOLN
15.0000 mL | Freq: Once | OROMUCOSAL | Status: AC
  Administered 2022-06-26: 15 mL via OROMUCOSAL
  Filled 2022-06-26: qty 15

## 2022-06-26 MED ORDER — MIDAZOLAM HCL 2 MG/2ML IJ SOLN
INTRAMUSCULAR | Status: AC
  Filled 2022-06-26: qty 2

## 2022-06-26 MED ORDER — BUPIVACAINE-EPINEPHRINE (PF) 0.25% -1:200000 IJ SOLN
INTRAMUSCULAR | Status: AC
  Filled 2022-06-26: qty 30

## 2022-06-26 MED ORDER — LIDOCAINE HCL 1 % IJ SOLN
INTRAMUSCULAR | Status: AC
  Filled 2022-06-26: qty 20

## 2022-06-26 MED ORDER — LIDOCAINE HCL 1 % IJ SOLN
INTRAMUSCULAR | Status: DC | PRN
  Administered 2022-06-26: 10 mL via SUBCUTANEOUS

## 2022-06-26 MED ORDER — OXYCODONE HCL 5 MG PO TABS: 5.0000 mg | ORAL_TABLET | Freq: Once | ORAL | Status: DC | PRN

## 2022-06-26 MED ORDER — ACETAMINOPHEN 500 MG PO TABS: 1000.0000 mg | ORAL_TABLET | Freq: Once | ORAL | Status: DC

## 2022-06-26 MED ORDER — MIDAZOLAM HCL 2 MG/2ML IJ SOLN
INTRAMUSCULAR | Status: DC | PRN
  Administered 2022-06-26: 2 mg via INTRAVENOUS

## 2022-06-26 MED ORDER — PROPOFOL 10 MG/ML IV BOLUS
INTRAVENOUS | Status: AC
Start: 2022-06-26 — End: ?
  Filled 2022-06-26: qty 20

## 2022-06-26 MED ORDER — OXYCODONE HCL 5 MG/5ML PO SOLN: 5.0000 mg | Freq: Once | ORAL | Status: DC | PRN

## 2022-06-26 MED ORDER — 0.9 % SODIUM CHLORIDE (POUR BTL) OPTIME
TOPICAL | Status: DC | PRN
  Administered 2022-06-26: 1000 mL

## 2022-06-26 MED ORDER — CHLORHEXIDINE GLUCONATE CLOTH 2 % EX PADS: 6.0000 | MEDICATED_PAD | Freq: Once | CUTANEOUS | Status: DC

## 2022-06-26 MED ORDER — ONDANSETRON HCL 4 MG/2ML IJ SOLN
INTRAMUSCULAR | Status: DC | PRN
  Administered 2022-06-26: 4 mg via INTRAVENOUS

## 2022-06-26 MED ORDER — PROPOFOL 10 MG/ML IV BOLUS
INTRAVENOUS | Status: DC | PRN
  Administered 2022-06-26: 200 mg via INTRAVENOUS

## 2022-06-26 MED ORDER — LACTATED RINGERS IV SOLN
INTRAVENOUS | Status: DC
Start: 2022-06-26 — End: 2022-06-26

## 2022-06-26 MED ORDER — PROMETHAZINE HCL 25 MG/ML IJ SOLN: 6.2500 mg | INTRAMUSCULAR | Status: DC | PRN

## 2022-06-26 MED ORDER — FENTANYL CITRATE (PF) 100 MCG/2ML IJ SOLN: 25.0000 ug | INTRAMUSCULAR | Status: DC | PRN

## 2022-06-26 MED ORDER — ACETAMINOPHEN 500 MG PO TABS
1000.0000 mg | ORAL_TABLET | ORAL | Status: AC
  Administered 2022-06-26: 1000 mg via ORAL
  Filled 2022-06-26: qty 2

## 2022-06-26 MED ORDER — SCOPOLAMINE 1 MG/3DAYS TD PT72
1.0000 | MEDICATED_PATCH | TRANSDERMAL | Status: DC
  Administered 2022-06-26: 1.5 mg via TRANSDERMAL
  Filled 2022-06-26: qty 1

## 2022-06-26 MED ORDER — MEPERIDINE HCL 25 MG/ML IJ SOLN: 6.2500 mg | INTRAMUSCULAR | Status: DC | PRN

## 2022-06-26 MED ORDER — LIDOCAINE 2% (20 MG/ML) 5 ML SYRINGE
INTRAMUSCULAR | Status: DC | PRN
  Administered 2022-06-26: 30 mg via INTRAVENOUS

## 2022-06-26 MED ORDER — FENTANYL CITRATE (PF) 250 MCG/5ML IJ SOLN
INTRAMUSCULAR | Status: AC
  Filled 2022-06-26: qty 5

## 2022-06-26 MED ORDER — DEXAMETHASONE SODIUM PHOSPHATE 10 MG/ML IJ SOLN
INTRAMUSCULAR | Status: DC | PRN
  Administered 2022-06-26: 5 mg via INTRAVENOUS

## 2022-06-26 MED ORDER — MIDAZOLAM HCL 2 MG/2ML IJ SOLN: 0.5000 mg | Freq: Once | INTRAMUSCULAR | Status: DC | PRN

## 2022-06-26 MED ORDER — PROPOFOL 10 MG/ML IV BOLUS
INTRAVENOUS | Status: AC
  Filled 2022-06-26: qty 20

## 2022-06-26 MED ORDER — FENTANYL CITRATE (PF) 250 MCG/5ML IJ SOLN
INTRAMUSCULAR | Status: DC | PRN
  Administered 2022-06-26: 50 ug via INTRAVENOUS

## 2022-06-26 MED ORDER — PHENYLEPHRINE 80 MCG/ML (10ML) SYRINGE FOR IV PUSH (FOR BLOOD PRESSURE SUPPORT)
PREFILLED_SYRINGE | INTRAVENOUS | Status: DC | PRN
  Administered 2022-06-26 (×2): 120 ug via INTRAVENOUS
  Administered 2022-06-26: 80 ug via INTRAVENOUS

## 2022-06-26 MED ORDER — ORAL CARE MOUTH RINSE
15.0000 mL | Freq: Once | OROMUCOSAL | Status: AC
Start: 2022-06-26 — End: 2022-06-26

## 2022-06-26 SURGICAL SUPPLY — 36 items
ADH SKN CLS APL DERMABOND .7 (GAUZE/BANDAGES/DRESSINGS) ×1
BAG COUNTER SPONGE SURGICOUNT (BAG) ×2 IMPLANT
BAG SPNG CNTER NS LX DISP (BAG) ×1
CHLORAPREP W/TINT 10.5 ML (MISCELLANEOUS) ×2 IMPLANT
COVER SURGICAL LIGHT HANDLE (MISCELLANEOUS) ×2 IMPLANT
DERMABOND ADVANCED (GAUZE/BANDAGES/DRESSINGS) ×1
DERMABOND ADVANCED .7 DNX12 (GAUZE/BANDAGES/DRESSINGS) ×1 IMPLANT
DRAPE LAPAROTOMY 100X72 PEDS (DRAPES) ×2 IMPLANT
ELECT CAUTERY BLADE 6.4 (BLADE) ×2 IMPLANT
ELECT REM PT RETURN 9FT ADLT (ELECTROSURGICAL) ×2
ELECTRODE REM PT RTRN 9FT ADLT (ELECTROSURGICAL) ×1 IMPLANT
GAUZE 4X4 16PLY ~~LOC~~+RFID DBL (SPONGE) ×2 IMPLANT
GLOVE BIO SURGEON STRL SZ 6 (GLOVE) ×2 IMPLANT
GLOVE INDICATOR 6.5 STRL GRN (GLOVE) ×2 IMPLANT
GOWN STRL REUS W/ TWL LRG LVL3 (GOWN DISPOSABLE) ×1 IMPLANT
GOWN STRL REUS W/TWL 2XL LVL3 (GOWN DISPOSABLE) ×2 IMPLANT
GOWN STRL REUS W/TWL LRG LVL3 (GOWN DISPOSABLE) ×2
KIT BASIN OR (CUSTOM PROCEDURE TRAY) ×2 IMPLANT
KIT TURNOVER KIT B (KITS) ×2 IMPLANT
NDL HYPO 25GX1X1/2 BEV (NEEDLE) ×1 IMPLANT
NEEDLE HYPO 25GX1X1/2 BEV (NEEDLE) ×2 IMPLANT
NS IRRIG 1000ML POUR BTL (IV SOLUTION) ×2 IMPLANT
PACK GENERAL/GYN (CUSTOM PROCEDURE TRAY) ×2 IMPLANT
PAD ARMBOARD 7.5X6 YLW CONV (MISCELLANEOUS) ×4 IMPLANT
PENCIL SMOKE EVACUATOR (MISCELLANEOUS) ×1 IMPLANT
SPIKE FLUID TRANSFER (MISCELLANEOUS) ×4 IMPLANT
STRIP CLOSURE SKIN 1/4X4 (GAUZE/BANDAGES/DRESSINGS) ×1 IMPLANT
SUT MON AB 4-0 PC3 18 (SUTURE) ×2 IMPLANT
SUT VIC AB 3-0 SH 27 (SUTURE) ×2
SUT VIC AB 3-0 SH 27X BRD (SUTURE) ×1 IMPLANT
SYR BULB IRRIG 60ML STRL (SYRINGE) ×1 IMPLANT
SYR CONTROL 10ML LL (SYRINGE) ×2 IMPLANT
TOWEL GREEN STERILE (TOWEL DISPOSABLE) ×2 IMPLANT
TOWEL GREEN STERILE FF (TOWEL DISPOSABLE) ×2 IMPLANT
TUBE CONNECTING 20X1/4 (TUBING) ×1 IMPLANT
YANKAUER SUCT BULB TIP NO VENT (SUCTIONS) ×1 IMPLANT

## 2022-06-26 NOTE — H&P (Signed)
Diane Miranda is an 46 y.o. female.   Chief Complaint: port in place HPI:  Pt is a lovely 46 yo F s/p right breast conservation, chemo, and radiation for right breast cancer.  She is done with treatment and desires port removal.    Past Medical History:  Diagnosis Date   Allergy 2000   Asthma    as child   Breast cancer ([place])    Family history of lung cancer 03/08/2021   Family history of uterine cancer 03/08/2021   GERD (gastroesophageal reflux disease)    Headache    History of COVID-19    History of radiation therapy    Right breast- 10/09/21-11/29/21- Dr. Gery Pray   Hyperlipidemia    Hypertension    Pre-diabetes    Prediabetes    right breast ca dx'd 02/2021   Trimalleolar fracture of ankle, closed, left, initial encounter     Past Surgical History:  Procedure Laterality Date   BREAST BIOPSY Right 03/22/2021   Procedure: PUNCH BIOPSY OF RIGHT AREOLA;  Surgeon: Stark Klein, MD;  Location: [place];  Service: General;  Laterality: Right;   BREAST LUMPECTOMY WITH RADIOACTIVE SEED AND SENTINEL LYMPH NODE BIOPSY Right 08/29/2021   Procedure: RIGHT BREAST LUMPECTOMY WITH RADIOACTIVE SEED AND RIGHT AXILLARY SENTINEL LYMPH NODE BIOPSY;  Surgeon: Stark Klein, MD;  Location: [place];  Service: General;  Laterality: Right;   FRACTURE SURGERY  2018   NO PAST SURGERIES     ORIF ANKLE FRACTURE Left 07/10/2017   Procedure: OPEN REDUCTION INTERNAL FIXATION (ORIF) trimallolar ANKLE FRACTURE;  Surgeon: Wylene Simmer, MD;  Location: [place];  Service: Orthopedics;  Laterality: Left;   PORTACATH PLACEMENT Left 03/22/2021   Procedure: INSERTION PORT-A-CATH;  Surgeon: Stark Klein, MD;  Location: [place];  Service: General;  Laterality: Left;   RADIOACTIVE SEED GUIDED AXILLARY SENTINEL LYMPH NODE Right 08/29/2021   Procedure: RADIOACTIVE SEED GUIDED AXILLARY SENTINEL LYMPH NODE EXCISION;  Surgeon: Stark Klein, MD;  Location: [place] [facility];   Service: General;  Laterality: Right;    Family History  Problem Relation Age of Onset   Early death Mother    Heart disease Mother    Hypertension Mother    Diabetes Father    Hypertension Father    Diabetes Brother    Hearing loss Maternal Grandmother    Hypertension Maternal Grandmother    Uterine cancer Other 58       MGM's sister   Throat cancer Other        MGM's brother; dx after 21   Lung cancer Other        PGM's sister; dx 46s   Social History:  reports that she has never smoked. She has never used smokeless tobacco. She reports current alcohol use. She reports that she does not use drugs.  Allergies:  Allergies  Allergen Reactions   Hydrochlorothiazide Anaphylaxis and Swelling    Medications Prior to Admission  Medication Sig Dispense Refill   fexofenadine (ALLEGRA) 60 MG tablet Take 1 tablet by mouth daily as needed (allergies).     gabapentin (NEURONTIN) 300 MG capsule Take 1 capsule (300 mg total) by mouth at bedtime. 90 capsule 4   losartan (COZAAR) 50 MG tablet Take 1 tablet (50 mg total) by mouth every evening. 90 tablet 1   metoprolol succinate (TOPROL-XL) 100 MG 24 hr tablet TAKE 1 TABLET BY MOUTH EVERY DAY IN THE MORNING 90 tablet 1   Multiple Vitamins-Minerals (MULTIVITAMIN WITH MINERALS)  tablet Take 1 tablet by mouth daily. Power     acetaminophen (TYLENOL) 500 MG tablet Take 1 tablet (500 mg total) by mouth 3 (three) times daily with meals as needed. Take with aleve 220 mg tablet (Patient not taking: Reported on 06/19/2022) 90 tablet 0    Results for orders placed or performed during the hospital encounter of 06/26/22 (from the past 48 hour(s))  No blood products     Status: None   Collection Time: 06/26/22  9:40 AM  Result Value Ref Range   Transfuse no blood products      TRANSFUSE NO BLOOD PRODUCTS, VERIFIED BY Barbie Haggis RN 07.12.23 0940 Performed at [facility] [address] [place], [place] [address]   Pregnancy, urine POC      Status: None   Collection Time: 06/26/22 10:00 AM  Result Value Ref Range   Preg Test, Ur NEGATIVE NEGATIVE    Comment:        THE SENSITIVITY OF THIS METHODOLOGY IS >24 mIU/mL   Basic metabolic panel per protocol     Status: Abnormal   Collection Time: 06/26/22 10:04 AM  Result Value Ref Range   Sodium 139 135 - 145 mmol/L   Potassium 4.0 3.5 - 5.1 mmol/L   Chloride 110 98 - 111 mmol/L   CO2 23 22 - 32 mmol/L   Glucose, Bld 121 (H) 70 - 99 mg/dL    Comment: Glucose reference range applies only to samples taken after fasting for at least 8 hours.   BUN 7 6 - 20 mg/dL   Creatinine, Ser 1.00 0.44 - 1.00 mg/dL   Calcium 8.5 (L) 8.9 - 10.3 mg/dL   GFR, Estimated >60 >60 mL/min    Comment: (NOTE) Calculated using the CKD-EPI Creatinine Equation (2021)    Anion gap 6 5 - 15    Comment: Performed at [facility] [address] [place], [place] [address]  CBC per protocol     Status: None   Collection Time: 06/26/22 10:04 AM  Result Value Ref Range   WBC 6.4 4.0 - 10.5 K/uL   RBC 4.25 3.87 - 5.11 MIL/uL   Hemoglobin 13.2 12.0 - 15.0 g/dL   HCT 39.3 36.0 - 46.0 %   MCV 92.5 80.0 - 100.0 fL   MCH 31.1 26.0 - 34.0 pg   MCHC 33.6 30.0 - 36.0 g/dL   RDW 14.3 11.5 - 15.5 %   Platelets 257 150 - 400 K/uL   nRBC 0.0 0.0 - 0.2 %    Comment: Performed at [facility], [place] [address] [place], [place] [address]   No results found.  Review of Systems  All other systems reviewed and are negative.   Blood pressure (!) 155/99, pulse 87, temperature 98.6 F (37 C), temperature source Oral, resp. rate 18, height '5\' 4"'$  (1.626 m), weight 102.1 kg, last menstrual period 04/30/2021, SpO2 98 %. Physical Exam Vitals reviewed.  Constitutional:      General: She is not in acute distress.    Appearance: Normal appearance.  HENT:     Head: Normocephalic and atraumatic.     Nose: Nose normal.     Mouth/Throat:     Mouth: Mucous membranes are moist.  Eyes:     General: No scleral  icterus.    Pupils: Pupils are equal, round, and reactive to light.  Cardiovascular:     Rate and Rhythm: Normal rate.  Pulmonary:     Effort: Pulmonary  effort is normal.     Comments: Left subclavian port in place. Abdominal:     Palpations: Abdomen is soft.  Skin:    General: Skin is warm and dry.     Capillary Refill: Capillary refill takes 2 to 3 seconds.  Neurological:     General: No focal deficit present.     Mental Status: She is alert and oriented to person, place, and time.  Psychiatric:        Mood and Affect: Mood normal.        Behavior: Behavior normal.        Thought Content: Thought content normal.        Judgment: Judgment normal.      Assessment/Plan Port in place Right breast cancer.  Plan port removal. Reviewed surgery, risks, and post op recovery/restrictions.    Stark Klein, MD 06/26/2022, 12:07 PM

## 2022-06-26 NOTE — Op Note (Signed)
  PRE-OPERATIVE DIAGNOSIS:  un-needed Port-A-Cath for right breast cancer  POST-OPERATIVE DIAGNOSIS:  Same   PROCEDURE:  Procedure(s):  REMOVAL PORT-A-CATH  SURGEON:  Surgeon(s):  Stark Klein, MD  ANESTHESIA:   Gen + local  EBL:   Minimal  SPECIMEN:  None  Complications : none known  Procedure:   Pt was  identified in the holding area and taken to the operating room where she was placed supine on the operating room table.  General anesthesia was induced.  The left upper chest was prepped and draped.  The prior incision was anesthetized with local anesthetic.  The incision was opened with a #15 blade.  The subcutaneous tissue was divided with the cautery.  The port was identified and the capsule opened.  The four 2-0 prolene sutures were removed.  The port was then removed and pressure held on the tract.  The catheter appeared intact without evidence of breakage, length was 23 cm.  The wound was inspected for hemostasis, which was achieved with cautery.  The wound was closed with 3-0 vicryl deep dermal interrupted sutures and 4-0 Monocryl running subcuticular suture.  The wound was cleaned, dried, and dressed with dermabond.  The patient was awakened from anesthesia and taken to the PACU in stable condition.  Needle, sponge, and instrument counts are correct.

## 2022-06-26 NOTE — Anesthesia Preprocedure Evaluation (Addendum)
Anesthesia Evaluation  Patient identified by MRN, date of birth, ID band Patient awake    Reviewed: Allergy & Precautions, NPO status , Patient's Chart, lab work & pertinent test results, reviewed documented beta blocker date and time   History of Anesthesia Complications Negative for: history of anesthetic complications  Airway Mallampati: II  TM Distance: >3 FB Neck ROM: Full    Dental  (+) Dental Advisory Given   Pulmonary neg pulmonary ROS,    breath sounds clear to auscultation       Cardiovascular hypertension, Pt. on medications and Pt. on home beta blockers (-) angina Rhythm:Regular Rate:Normal  12/2021 ECHO: EF 60-65%. The LV has normal function, no regional wall motion abnormalities, no significant valvular abnormalities   Neuro/Psych negative neurological ROS     GI/Hepatic Neg liver ROS, GERD  Controlled,  Endo/Other  Morbid obesity  Renal/GU negative Renal ROS     Musculoskeletal   Abdominal (+) + obese,   Peds  Hematology negative hematology ROS (+)   Anesthesia Other Findings Breast cancer: chemo, xrt, surgery  Reproductive/Obstetrics                            Anesthesia Physical Anesthesia Plan  ASA: 3  Anesthesia Plan: General   Post-op Pain Management: Tylenol PO (pre-op)*   Induction:   PONV Risk Score and Plan: 3 and Ondansetron, Dexamethasone and Scopolamine patch - Pre-op  Airway Management Planned: LMA  Additional Equipment: None  Intra-op Plan:   Post-operative Plan:   Informed Consent: I have reviewed the patients History and Physical, chart, labs and discussed the procedure including the risks, benefits and alternatives for the proposed anesthesia with the patient or authorized representative who has indicated his/her understanding and acceptance.     Dental advisory given  Plan Discussed with: CRNA and Surgeon  Anesthesia Plan Comments:         Anesthesia Quick Evaluation

## 2022-06-26 NOTE — Transfer of Care (Signed)
Immediate Anesthesia Transfer of Care Note  Patient: Diane Miranda  Procedure(s) Performed: PORT REMOVAL  Patient Location: PACU  Anesthesia Type:General  Level of Consciousness: awake, alert  and oriented  Airway & Oxygen Therapy: Patient Spontanous Breathing  Post-op Assessment: Report given to RN and Post -op Vital signs reviewed and stable  Post vital signs: Reviewed and stable  Last Vitals:  Vitals Value Taken Time  BP 133/68 06/26/22 1323  Temp    Pulse 92 06/26/22 1327  Resp 22 06/26/22 1327  SpO2 97 % 06/26/22 1327  Vitals shown include unvalidated device data.  Last Pain:  Vitals:   06/26/22 0950  TempSrc:   PainSc: 0-No pain         Complications: No notable events documented.

## 2022-06-26 NOTE — Discharge Instructions (Addendum)
[place] Office Phone Number [phone]   POST OP INSTRUCTIONS  Always review your discharge instruction sheet given to you by the facility where your surgery was performed.  IF YOU HAVE DISABILITY OR FAMILY LEAVE FORMS, YOU MUST BRING THEM TO THE OFFICE FOR PROCESSING.  DO NOT GIVE THEM TO YOUR DOCTOR.  Take 2 tylenol (acetominophen) three times a day for 3 days.  If you still have pain, add ibuprofen with food in between if able to take this (if you have kidney issues or stomach issues, do not take ibuprofen).  If both of those are not enough, add the narcotic pain pill.  If you find you are needing a lot of this overnight after surgery, call the next morning for a refill.   Take your usually prescribed medications unless otherwise directed If you need a refill on your pain medication, please contact your pharmacy.  They will contact our office to request authorization.  Prescriptions will not be filled after 5pm or on week-ends. You should eat very light the first 24 hours after surgery, such as soup, crackers, pudding, etc.  Resume your normal diet the day after surgery It is common to experience some constipation if taking pain medication after surgery.  Increasing fluid intake and taking a stool softener will usually help or prevent this problem from occurring.  A mild laxative (Milk of Magnesia or Miralax) should be taken according to package directions if there are no bowel movements after 48 hours. You may shower in 48 hours.  The surgical glue will flake off in 2-3 weeks.   ACTIVITIES:  No strenuous activity or heavy lifting for 1 week.   You may drive when you no longer are taking prescription pain medication, you can comfortably wear a seatbelt, and you can safely maneuver your car and apply brakes. RETURN TO WORK:  __________as tolerated as long as no lifting x 1 week_______________ You should see your doctor in the office for a follow-up appointment approximately  three-four weeks after your surgery.    WHEN TO CALL YOUR DOCTOR: Fever over 101.0 Nausea and/or vomiting. Extreme swelling or bruising. Continued bleeding from incision. Increased pain, redness, or drainage from the incision.  The clinic staff is available to answer your questions during regular business hours.  Please don't hesitate to call and ask to speak to one of the nurses for clinical concerns.  If you have a medical emergency, go to the nearest emergency room or call 911.  A surgeon from [facility] is always on call at the hospital.  For further questions, please visit [email or web address]

## 2022-06-26 NOTE — Anesthesia Postprocedure Evaluation (Signed)
Anesthesia Post Note  Patient: Diane Miranda  Procedure(s) Performed: PORT REMOVAL     Patient location during evaluation: PACU Anesthesia Type: General Level of consciousness: awake and alert, patient cooperative and oriented Pain management: pain level controlled Vital Signs Assessment: post-procedure vital signs reviewed and stable Respiratory status: spontaneous breathing, nonlabored ventilation and respiratory function stable Cardiovascular status: blood pressure returned to baseline and stable Postop Assessment: no apparent nausea or vomiting and able to ambulate Anesthetic complications: no   No notable events documented.  Last Vitals:  Vitals:   06/26/22 1340 06/26/22 1355  BP: 126/87 140/90  Pulse: 82 83  Resp: (!) 21 (!) 22  Temp:  36.6 C  SpO2: 96% 96%    Last Pain:  Vitals:   06/26/22 1355  TempSrc:   PainSc: 0-No pain                 Jimesha Rising,E. Jireh Vinas

## 2022-06-27 ENCOUNTER — Encounter (HOSPITAL_COMMUNITY): Payer: Self-pay | Admitting: General Surgery

## 2022-07-08 ENCOUNTER — Other Ambulatory Visit: Payer: Self-pay

## 2022-07-12 ENCOUNTER — Other Ambulatory Visit: Payer: Self-pay | Admitting: *Deleted

## 2022-07-12 ENCOUNTER — Other Ambulatory Visit: Payer: Self-pay | Admitting: Hematology and Oncology

## 2022-07-12 ENCOUNTER — Ambulatory Visit (HOSPITAL_COMMUNITY): Admission: RE | Admit: 2022-07-12 | Payer: No Typology Code available for payment source | Source: Ambulatory Visit

## 2022-07-12 DIAGNOSIS — Z170 Estrogen receptor positive status [ER+]: Secondary | ICD-10-CM

## 2022-07-12 DIAGNOSIS — K862 Cyst of pancreas: Secondary | ICD-10-CM

## 2022-07-15 ENCOUNTER — Other Ambulatory Visit: Payer: Self-pay

## 2022-07-15 ENCOUNTER — Inpatient Hospital Stay: Payer: No Typology Code available for payment source | Attending: Oncology

## 2022-07-15 ENCOUNTER — Inpatient Hospital Stay (HOSPITAL_BASED_OUTPATIENT_CLINIC_OR_DEPARTMENT_OTHER): Payer: No Typology Code available for payment source | Admitting: Hematology and Oncology

## 2022-07-15 VITALS — BP 143/90 | HR 73 | Temp 97.5°F | Resp 16 | Ht 64.0 in | Wt 241.5 lb

## 2022-07-15 DIAGNOSIS — Z170 Estrogen receptor positive status [ER+]: Secondary | ICD-10-CM

## 2022-07-15 DIAGNOSIS — K862 Cyst of pancreas: Secondary | ICD-10-CM

## 2022-07-15 DIAGNOSIS — Z923 Personal history of irradiation: Secondary | ICD-10-CM | POA: Insufficient documentation

## 2022-07-15 DIAGNOSIS — C50511 Malignant neoplasm of lower-outer quadrant of right female breast: Secondary | ICD-10-CM

## 2022-07-15 DIAGNOSIS — C50411 Malignant neoplasm of upper-outer quadrant of right female breast: Secondary | ICD-10-CM | POA: Diagnosis present

## 2022-07-15 DIAGNOSIS — Z79899 Other long term (current) drug therapy: Secondary | ICD-10-CM | POA: Insufficient documentation

## 2022-07-15 LAB — CBC WITH DIFFERENTIAL (CANCER CENTER ONLY)
Abs Immature Granulocytes: 0.02 10*3/uL (ref 0.00–0.07)
Basophils Absolute: 0 10*3/uL (ref 0.0–0.1)
Basophils Relative: 0 %
Eosinophils Absolute: 0.2 10*3/uL (ref 0.0–0.5)
Eosinophils Relative: 2 %
HCT: 38.2 % (ref 36.0–46.0)
Hemoglobin: 13.1 g/dL (ref 12.0–15.0)
Immature Granulocytes: 0 %
Lymphocytes Relative: 18 %
Lymphs Abs: 1.5 10*3/uL (ref 0.7–4.0)
MCH: 31.5 pg (ref 26.0–34.0)
MCHC: 34.3 g/dL (ref 30.0–36.0)
MCV: 91.8 fL (ref 80.0–100.0)
Monocytes Absolute: 0.5 10*3/uL (ref 0.1–1.0)
Monocytes Relative: 7 %
Neutro Abs: 5.7 10*3/uL (ref 1.7–7.7)
Neutrophils Relative %: 73 %
Platelet Count: 158 10*3/uL (ref 150–400)
RBC: 4.16 MIL/uL (ref 3.87–5.11)
RDW: 14.2 % (ref 11.5–15.5)
WBC Count: 7.9 10*3/uL (ref 4.0–10.5)
nRBC: 0 % (ref 0.0–0.2)

## 2022-07-15 LAB — CMP (CANCER CENTER ONLY)
ALT: 9 U/L (ref 0–44)
AST: 10 U/L — ABNORMAL LOW (ref 15–41)
Albumin: 3.9 g/dL (ref 3.5–5.0)
Alkaline Phosphatase: 49 U/L (ref 38–126)
Anion gap: 5 (ref 5–15)
BUN: 10 mg/dL (ref 6–20)
CO2: 26 mmol/L (ref 22–32)
Calcium: 8.7 mg/dL — ABNORMAL LOW (ref 8.9–10.3)
Chloride: 108 mmol/L (ref 98–111)
Creatinine: 0.86 mg/dL (ref 0.44–1.00)
GFR, Estimated: >60 mL/min (ref >60)
Glucose, Bld: 102 mg/dL — ABNORMAL HIGH (ref 70–99)
Potassium: 3.7 mmol/L (ref 3.5–5.1)
Sodium: 139 mmol/L (ref 135–145)
Total Bilirubin: 0.5 mg/dL (ref 0.3–1.2)
Total Protein: 6.6 g/dL (ref 6.5–8.1)

## 2022-07-15 NOTE — Progress Notes (Signed)
CLINIC:  Survivorship   REASON FOR VISIT:  Routine follow-up visit for a recent history of breast cancer.  BRIEF ONCOLOGIC HISTORY:  Oncology History  Malignant neoplasm of upper-outer quadrant of right breast in female, estrogen receptor positive ([place])  03/01/2021 Initial Diagnosis   Malignant neoplasm of upper-outer quadrant of right breast in female, estrogen receptor positive ([place])   03/07/2021 Cancer Staging   Staging form: Breast, AJCC 8th Edition - Clinical: Stage IIB (cT3, cN1, cM0, G3, ER+, PR+, HER2+) - Signed by Chauncey Cruel, MD on 03/07/2021 Histologic grading system: 3 grade system   03/26/2021 Genetic Testing   Negative hereditary cancer genetic testing: no pathogenic variants detected in [facility] CustomNext-Cancer +RNAinsight Panel.  The report date is March 26, 2021.    The CustomNext-Cancer+RNAinsight panel offered by [facility] includes sequencing and rearrangement analysis for the following 47 genes:  APC, ATM, AXIN2, BARD1, BMPR1A, BRCA1, BRCA2, BRIP1, CDH1, CDK4, CDKN2A, CHEK2, DICER1, EPCAM, GREM1, HOXB13, MEN1, MLH1, MSH2, MSH3, MSH6, MUTYH, NBN, NF1, NF2, NTHL1, PALB2, PMS2, POLD1, POLE, PTEN, RAD51C, RAD51D, RECQL, RET, SDHA, SDHAF2, SDHB, SDHC, SDHD, SMAD4, SMARCA4, STK11, TP53, TSC1, TSC2, and VHL.  RNA data is routinely analyzed for use in variant interpretation for all genes.   03/28/2021 - 07/12/2021 Chemotherapy         07/31/2021 -  Chemotherapy   Patient is on Treatment Plan : BREAST Trastuzumab + Pertuzumab q21d       INTERVAL HISTORY:   Diane Miranda presents to clinic for follow-up.  Diane Miranda completed Herceptin and pertuzumab in March 2023.  Diane Miranda declined antiestrogen therapy.  Diane Miranda continues to do well except for some random pain in the right upper quadrant and wonders if this can be related to the liver cyst.  Diane Miranda also has noticed some pain in the area of her port insertion despite removal of the port.  There is no specific pattern to the right  upper quadrant pain, it happens very sporadically.  Besides this, Diane Miranda denies any change in breathing, bowel habits or urinary habits.  No new neurological complaints.  Diane Miranda once again declines antiestrogen therapy. Rest of the pertinent 10 point ROS reviewed and negative  REVIEW OF SYSTEMS:  Review of Systems  Constitutional:  Negative for appetite change, fatigue and fever.  HENT:   Negative for lump/mass, nosebleeds and sore throat.   Eyes:  Positive for eye problems (decreased visual acuity).  Respiratory:  Negative for cough and shortness of breath.   Cardiovascular:  Negative for chest pain and leg swelling.  Gastrointestinal:  Positive for diarrhea. Negative for abdominal pain, blood in stool, constipation, nausea and vomiting.  Skin:  Negative for itching and rash.  Neurological:  Negative for dizziness, headaches and numbness.   Breast: Denies any new nodularity, masses, tenderness, nipple changes, or nipple discharge.   ONCOLOGY TREATMENT TEAM:  1. Surgeon:  Dr. Stark Klein at [facility] 2. Medical Oncologist: Dr. Arletha Pili Tycho [place]am[place]  3. Radiation Oncologist: Dr. Gery Pray    PAST MEDICAL/SURGICAL HISTORY:  Past Medical History:  Diagnosis Date   Allergy 2000   Asthma    as child   Breast cancer [facility])    Family history of lung cancer 03/08/2021   Family history of uterine cancer 03/08/2021   GERD (gastroesophageal reflux disease)    Headache    History of COVID-19    History of radiation therapy    Right breast- 10/09/21-11/29/21- Dr. Gery Pray   Hyperlipidemia    Hypertension  Pre-diabetes    Prediabetes    right breast ca dx'd 02/2021   Trimalleolar fracture of ankle, closed, left, initial encounter    Past Surgical History:  Procedure Laterality Date   BREAST BIOPSY Right 03/22/2021   Procedure: PUNCH BIOPSY OF RIGHT AREOLA;  Surgeon: Stark Klein, MD;  Location: [place];  Service: General;  Laterality: Right;   BREAST  LUMPECTOMY WITH RADIOACTIVE SEED AND SENTINEL LYMPH NODE BIOPSY Right 08/29/2021   Procedure: RIGHT BREAST LUMPECTOMY WITH RADIOACTIVE SEED AND RIGHT AXILLARY SENTINEL LYMPH NODE BIOPSY;  Surgeon: Stark Klein, MD;  Location: [place];  Service: General;  Laterality: Right;   FRACTURE SURGERY  2018   NO PAST SURGERIES     ORIF ANKLE FRACTURE Left 07/10/2017   Procedure: OPEN REDUCTION INTERNAL FIXATION (ORIF) trimallolar ANKLE FRACTURE;  Surgeon: Wylene Simmer, MD;  Location: [place];  Service: Orthopedics;  Laterality: Left;   PORT-A-CATH REMOVAL N/A 06/26/2022   Procedure: PORT REMOVAL;  Surgeon: Stark Klein, MD;  Location: [place];  Service: General;  Laterality: N/A;   PORTACATH PLACEMENT Left 03/22/2021   Procedure: INSERTION PORT-A-CATH;  Surgeon: Stark Klein, MD;  Location: [place];  Service: General;  Laterality: Left;   RADIOACTIVE SEED GUIDED AXILLARY SENTINEL LYMPH NODE Right 08/29/2021   Procedure: RADIOACTIVE SEED GUIDED AXILLARY SENTINEL LYMPH NODE EXCISION;  Surgeon: Stark Klein, MD;  Location: [place];  Service: General;  Laterality: Right;     ALLERGIES:  Allergies  Allergen Reactions   Hydrochlorothiazide Anaphylaxis and Swelling     CURRENT MEDICATIONS:  Outpatient Encounter Medications as of 07/15/2022  Medication Sig   acetaminophen (TYLENOL) 500 MG tablet Take 1 tablet (500 mg total) by mouth 3 (three) times daily with meals as needed. Take with aleve 220 mg tablet (Patient not taking: Reported on 06/19/2022)   fexofenadine (ALLEGRA) 60 MG tablet Take 1 tablet by mouth daily as needed (allergies).   gabapentin (NEURONTIN) 300 MG capsule Take 1 capsule (300 mg total) by mouth at bedtime.   losartan (COZAAR) 50 MG tablet Take 1 tablet (50 mg total) by mouth every evening.   metoprolol succinate (TOPROL-XL) 100 MG 24 hr tablet TAKE 1 TABLET BY MOUTH EVERY DAY IN THE MORNING   Multiple Vitamins-Minerals (MULTIVITAMIN WITH MINERALS) tablet Take  1 tablet by mouth daily. Power   Facility-Administered Encounter Medications as of 07/15/2022  Medication   acetaminophen (TYLENOL) tablet 650 mg   diphenhydrAMINE (BENADRYL) capsule 25 mg   heparin lock flush 100 unit/mL   sodium chloride flush (NS) 0.9 % injection 10 mL     ONCOLOGIC FAMILY HISTORY:  Family History  Problem Relation Age of Onset   Early death Mother    Heart disease Mother    Hypertension Mother    Diabetes Father    Hypertension Father    Diabetes Brother    Hearing loss Maternal Grandmother    Hypertension Maternal Grandmother    Uterine cancer Other 70       MGM's sister   Throat cancer Other        MGM's brother; dx after 70   Lung cancer Other        PGM's sister; dx 71s     GENETIC COUNSELING/TESTING: Genetic testing completed on 03/26/2021 through the CustomNext-Cancer+RNAinsight panel offered by [employer] found no deleterious mutations in APC, ATM, AXIN2, BARD1, BMPR1A, BRCA1, BRCA2, BRIP1, CDH1, CDK4, CDKN2A, CHEK2, DICER1, EPCAM, GREM1, HOXB13, MEN1, MLH1, MSH2, MSH3, MSH6, MUTYH, NBN, NF1,  NF2, NTHL1, PALB2, PMS2, POLD1, POLE, PTEN, RAD51C, RAD51D, RECQL, RET, SDHA, SDHAF2, SDHB, SDHC, SDHD, SMAD4, SMARCA4, STK11, TP53, TSC1, TSC2, and VHL.  RNA data is routinely analyzed for use in variant interpretation for all genes.    SOCIAL HISTORY:  Social History   Socioeconomic History   Marital status: Single    Spouse name: Not on file   Number of children: 3   Years of education: Not on file   Highest education level: Not on file  Occupational History   Not on file  Tobacco Use   Smoking status: Never   Smokeless tobacco: Never  Vaping Use   Vaping Use: Never used  Substance and Sexual Activity   Alcohol use: Yes    Comment: social   Drug use: No   Sexual activity: Yes    Birth control/protection: I.U.D.  Other Topics Concern   Not on file  Social History Narrative   Not on file   Social Determinants of Health   Financial  Resource Strain: Not on file  Food Insecurity: Not on file  Transportation Needs: Not on file  Physical Activity: Not on file  Stress: Not on file  Social Connections: Not on file      PHYSICAL EXAMINATION:  Vital Signs: Vitals:   07/15/22 1338  BP: (!) 143/90  Pulse: 73  Resp: 16  Temp: (!) 97.5 F (36.4 C)  SpO2: 100%   Filed Weights   07/15/22 1338  Weight: 241 lb 8 oz (109.5 kg)   Physical Exam Constitutional:      Appearance: Normal appearance.  Chest:     Comments: Bilateral breasts inspected.  Right breast with postradiation changes.  No palpable masses.  Area of tenderness corresponds to the rib right beneath the breast, mild, no crepitus on palpation.  Left breast normal to inspection and palpation.  No regional adenopathy. Abdominal:     General: Abdomen is flat. Bowel sounds are normal.     Palpations: Abdomen is soft.  Musculoskeletal:        General: No swelling or tenderness. Normal range of motion.     Cervical back: Normal range of motion and neck supple. No rigidity.  Neurological:     General: No focal deficit present.     Mental Status: Diane Miranda is alert.  Psychiatric:        Mood and Affect: Mood normal.      LABORATORY DATA:  None for this visit.  DIAGNOSTIC IMAGING:  None for this visit.    ASSESSMENT AND PLAN:   Diane Miranda is a pleasant 46 y.o. female with Stage IIB right breast invasive ductal carcinoma, ER+/PR+/HER2+, diagnosed on 02/23/2021. Diane Miranda is status post neoadjuvant chemotherapy, lumpectomy, adjuvant radiation and status post Herceptin and Perjeta maintenance in March 2023.  Diane Miranda declined antiestrogen therapy.  Diane Miranda completed survivorship clinic visit.  Diane Miranda is here for follow-up.  Diane Miranda continues to complain of some intermittent right upper quadrant pain and pain at the site of previous port insertion.  No other complaints.  Diane Miranda has an MRI of her abdomen scheduled for August to monitor her pancreatic cyst.  We will evaluate if there is  any finding that could explain her abdominal pain.  At this time, I do not believe her cyst in liver and pancreas could be contributing to this abdominal pain.  #Pancreatic cyst: Most recent MRI abdomen from 01/15/2022 showed unchanged appearance of cystic lesion within the head of pancreas measuring 2.3 cm.  MRI abdomen scheduled for August  9.  Will monitor these results.  #. Bone health:  Given Ms. Cervantes's history of breast cancer, Diane Miranda is at risk for bone demineralization.   Have discussed weightbearing exercises, calcium and vitamin D supplementation.   Dispo:    -Return to cancer center in 6 months or sooner for visit. Diane Miranda is overdue for a mammogram, have requested her to schedule this, this has been ordered. We have once again talked about role of antiestrogen therapy and Diane Miranda declined.  Diane Miranda understands the risks and benefits. Diane Miranda was encouraged to call us sooner with any new questions or concerns.  I have spent a total of 30 minutes minutes of face-to-face and non-face-to-face time, preparing to see the patient, obtaining and/or reviewing separately obtained history, performing a medically appropriate examination, counseling and educating the patient, communicating with other health care professionals, documenting clinical information in the electronic health record, and care coordination.   Benay Pike MD

## 2022-07-16 ENCOUNTER — Telehealth: Payer: Self-pay | Admitting: Hematology and Oncology

## 2022-07-16 DIAGNOSIS — Z419 Encounter for procedure for purposes other than remedying health state, unspecified: Secondary | ICD-10-CM | POA: Diagnosis not present

## 2022-07-16 NOTE — Telephone Encounter (Signed)
Contacted patient to scheduled appointments. Patient is aware of appointments that are scheduled.   

## 2022-07-17 ENCOUNTER — Other Ambulatory Visit: Payer: Self-pay

## 2022-07-24 ENCOUNTER — Ambulatory Visit (HOSPITAL_COMMUNITY)
Admission: RE | Admit: 2022-07-24 | Discharge: 2022-07-24 | Disposition: A | Discharge status: Home / Self Care | Payer: No Typology Code available for payment source | Source: Ambulatory Visit | Attending: Hematology and Oncology | Admitting: Hematology and Oncology

## 2022-07-24 DIAGNOSIS — K862 Cyst of pancreas: Secondary | ICD-10-CM | POA: Diagnosis present

## 2022-07-24 MED ORDER — GADOBUTROL 1 MMOL/ML IV SOLN
9.8000 mL | Freq: Once | INTRAVENOUS | Status: AC | PRN
Start: 2022-07-24 — End: 2022-07-24
  Administered 2022-07-24: 9.8 mL via INTRAVENOUS

## 2022-07-26 ENCOUNTER — Other Ambulatory Visit: Payer: Self-pay | Admitting: Hematology and Oncology

## 2022-07-26 DIAGNOSIS — Z170 Estrogen receptor positive status [ER+]: Secondary | ICD-10-CM

## 2022-07-26 NOTE — Progress Notes (Signed)
MRCP ordered for 6 months from now as recommended by radiology.

## 2022-07-30 ENCOUNTER — Telehealth: Payer: Self-pay | Admitting: *Deleted

## 2022-07-30 NOTE — Telephone Encounter (Addendum)
-----   Message from Benay Pike, MD sent at 07/26/2022  8:23 AM EDT ----- Please convey the message to the patient. I already ordered next MRI in 6 months per radiology protocol.  Pt contacted and informed.

## 2022-08-12 ENCOUNTER — Ambulatory Visit: Payer: No Typology Code available for payment source | Attending: Hematology and Oncology

## 2022-08-12 VITALS — Wt 239.4 lb

## 2022-08-12 DIAGNOSIS — Z483 Aftercare following surgery for neoplasm: Secondary | ICD-10-CM | POA: Insufficient documentation

## 2022-08-12 NOTE — Therapy (Signed)
OUTPATIENT PHYSICAL THERAPY SOZO SCREENING NOTE   Patient Name: Diane Miranda MRN: [id number] DOB:04-01-1976, 46 y.o., female Today's Date: 08/12/2022  PCP: [name], MD REFERRING PROVIDER: [name], MD   PT End of Session - 08/12/22 1546     Visit Number 8   # unchanged due to screen only   PT Start Time 1544    PT Stop Time 1549    PT Time Calculation (min) 5 min    Activity Tolerance Patient tolerated treatment well    Behavior During Therapy [facility] for tasks assessed/performed             Past Medical History:  Diagnosis Date   Allergy 2000   Asthma    as child   Breast cancer ([place])    Family history of lung cancer 03/08/2021   Family history of uterine cancer 03/08/2021   GERD (gastroesophageal reflux disease)    Headache    History of COVID-19    History of radiation therapy    Right breast- 10/09/21-11/29/21- Dr. Gery Pray   Hyperlipidemia    Hypertension    Pre-diabetes    Prediabetes    right breast ca dx'd 02/2021   Trimalleolar fracture of ankle, closed, left, initial encounter    Past Surgical History:  Procedure Laterality Date   BREAST BIOPSY Right 03/22/2021   Procedure: PUNCH BIOPSY OF RIGHT AREOLA;  Surgeon: Stark Klein, MD;  Location: [place];  Service: General;  Laterality: Right;   BREAST LUMPECTOMY WITH RADIOACTIVE SEED AND SENTINEL LYMPH NODE BIOPSY Right 08/29/2021   Procedure: RIGHT BREAST LUMPECTOMY WITH RADIOACTIVE SEED AND RIGHT AXILLARY SENTINEL LYMPH NODE BIOPSY;  Surgeon: Stark Klein, MD;  Location: [place];  Service: General;  Laterality: Right;   FRACTURE SURGERY  2018   NO PAST SURGERIES     ORIF ANKLE FRACTURE Left 07/10/2017   Procedure: OPEN REDUCTION INTERNAL FIXATION (ORIF) trimallolar ANKLE FRACTURE;  Surgeon: Wylene Simmer, MD;  Location: [place];  Service: Orthopedics;  Laterality: Left;   PORT-A-CATH REMOVAL N/A 06/26/2022   Procedure: PORT REMOVAL;  Surgeon:  Stark Klein, MD;  Location: [place];  Service: General;  Laterality: N/A;   PORTACATH PLACEMENT Left 03/22/2021   Procedure: INSERTION PORT-A-CATH;  Surgeon: Stark Klein, MD;  Location: [place];  Service: General;  Laterality: Left;   RADIOACTIVE SEED GUIDED AXILLARY SENTINEL LYMPH NODE Right 08/29/2021   Procedure: RADIOACTIVE SEED GUIDED AXILLARY SENTINEL LYMPH NODE EXCISION;  Surgeon: Stark Klein, MD;  Location: [place];  Service: General;  Laterality: Right;   Patient Active Problem List   Diagnosis Date Noted   Port-A-Cath in place 09/11/2021   History of COVID-19    Genetic testing 03/27/2021   Family history of uterine cancer 03/08/2021   Family history of lung cancer 03/08/2021   Malignant neoplasm of upper-outer quadrant of right breast in female, estrogen receptor positive ([place]) 03/01/2021   SINUSITIS - ACUTE-NOS 09/07/2009   EXUDATIVE PHARYNGITIS 09/07/2009   LOW HDL 08/03/2008   HSV 08/01/2008   OBESITY 08/01/2008   ALLERGIC RHINITIS 08/01/2008   VAGINITIS 08/01/2008   HEADACHE 08/01/2008    REFERRING DIAG: right breast cancer at risk for lymphedema  THERAPY DIAG: Aftercare following surgery for neoplasm  PERTINENT HISTORY: Diagnosed on 03/07/2021 with R breast cancer stage IIB (ER/PR+, HER2+), underwent a R lumpectomy and SLNB (0/5) on  08/29/21, she completed neoadjuvant chemo, and adjuvant radiation and recently completed chemo, declined antiestrogen therapy  PRECAUTIONS:  right UE Lymphedema risk, None  SUBJECTIVE: Pt returns for her 3 month L-Dex screen.   PAIN:  Are you having pain? No  SOZO SCREENING: Patient was assessed today using the SOZO machine to determine the lymphedema index score. This was compared to her baseline score. It was determined that she is within the recommended range when compared to her baseline and no further action is needed at this time. She will continue SOZO screenings. These are done every 3 months for 2 years post  operatively followed by every 6 months for 2 years, and then annually.   L-DEX FLOWSHEETS - 08/12/22 1500       L-DEX LYMPHEDEMA SCREENING   Measurement Type Unilateral    L-DEX MEASUREMENT EXTREMITY Upper Extremity    POSITION  Standing    DOMINANT SIDE Right    At Risk Side Right    BASELINE SCORE (UNILATERAL) -5.2    L-DEX SCORE (UNILATERAL) -4.6    VALUE CHANGE (UNILAT) 0.6               Otelia Limes, PTA 08/12/2022, 3:49 PM

## 2022-08-16 ENCOUNTER — Other Ambulatory Visit: Payer: Self-pay

## 2022-08-16 DIAGNOSIS — Z419 Encounter for procedure for purposes other than remedying health state, unspecified: Secondary | ICD-10-CM | POA: Diagnosis not present

## 2022-08-21 ENCOUNTER — Other Ambulatory Visit: Payer: Self-pay | Admitting: Cardiology

## 2022-08-21 DIAGNOSIS — I10 Essential (primary) hypertension: Secondary | ICD-10-CM

## 2022-09-02 IMAGING — MR MR BREAST BILAT WO/W CM
8 of 13 series · 31 of 48 positions shown · IV contrast (10 ml gadavist)
Comparison: Previous exam(s).

CLINICAL DATA: Recent diagnosis invasive ductal carcinoma of the
right breast at 9 o'clock as well as metastatic right axillary
lymphadenopathy. Patient also underwent a biopsy of an abnormality
in the anterior right breast at 11 o'clock, which revealed adenosis
and columnar cell hyperplasia without malignancy or atypia. Current
exam is to assess extent of disease.

LABS:  No labs drawn at time of imaging.
EXAM:
BILATERAL BREAST MRI WITH AND WITHOUT CONTRAST
TECHNIQUE: Multiplanar, multisequence MR images of both breasts were obtained
prior to and following the intravenous administration of 10 ml of
Gadavist

[Series 2: t2_tirm_tra ipat (a-p) · axial · 3.0mm · 0.78mm/px · 1 of 60 slices shown]
[im 1/60]
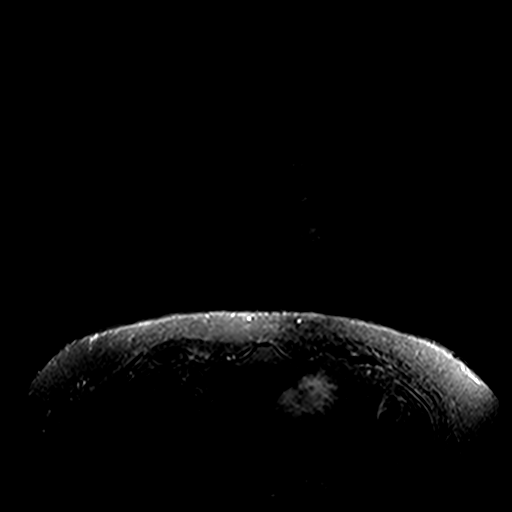

[Series 3: fl3d pre-cm no · axial · non-contrast · 1.2mm · 1.04mm/px · z∈[-127,+64]mm · 5 of 160 slices shown]
[im 1/160]
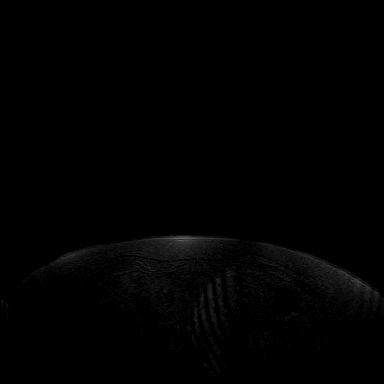
[im 40/160]
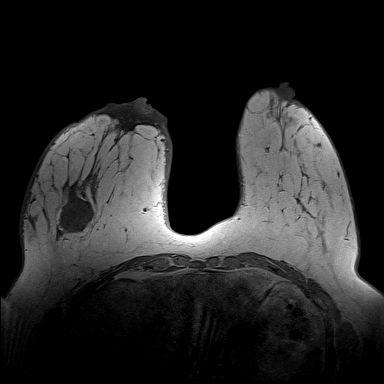
[im 80/160]
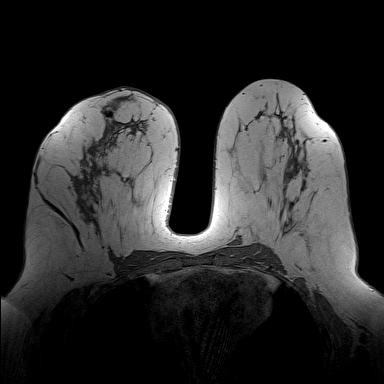
[im 120/160]
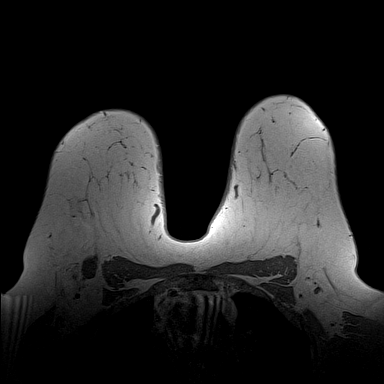
[im 160/160]
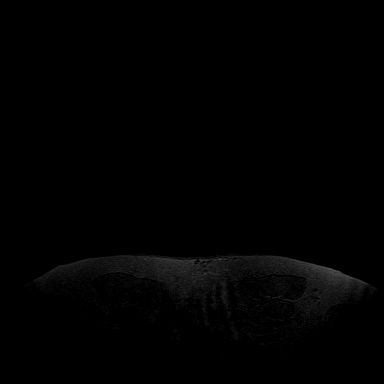

[Series 4: fl3d pre-cm · axial · non-contrast · 1.2mm · 1.04mm/px · z∈[-127,+64]mm · 5 of 160 slices shown]
[im 1/160]
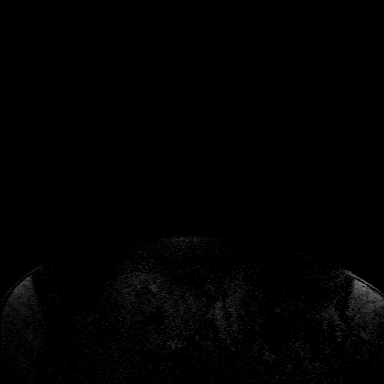
[im 40/160]
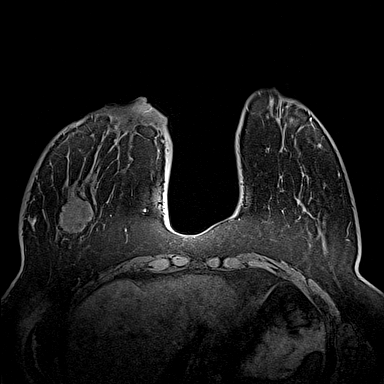
[im 80/160]
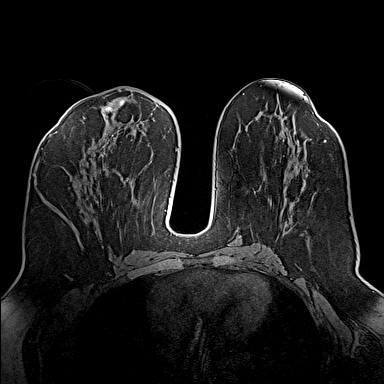
[im 120/160]
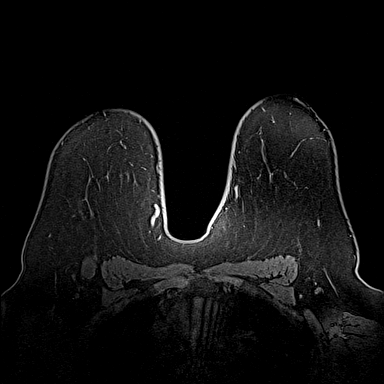
[im 160/160]
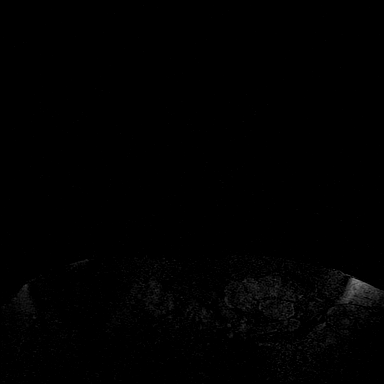

[Series 5: fl3d post-cm 20 · axial · 1.2mm · 1.04mm/px · z∈[-127,+64]mm · 5 of 160 slices shown (1 of 3)]
[im 1/160]
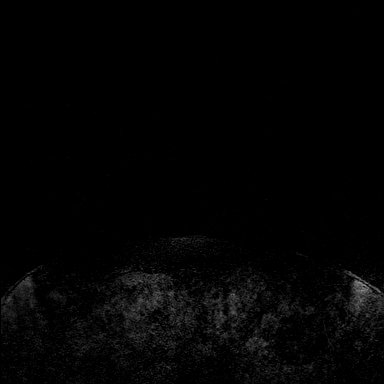
[im 40/160]
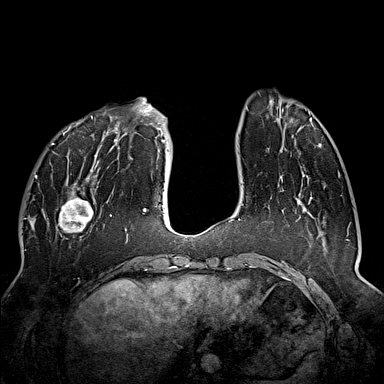
[im 80/160]
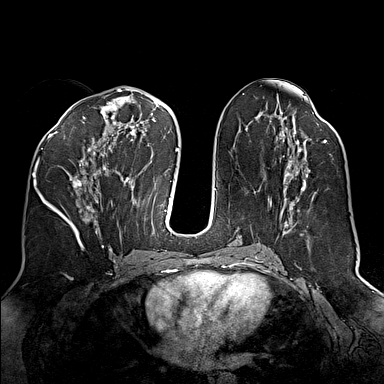
[im 120/160]
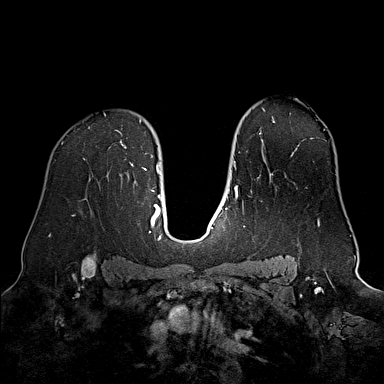
[im 160/160]
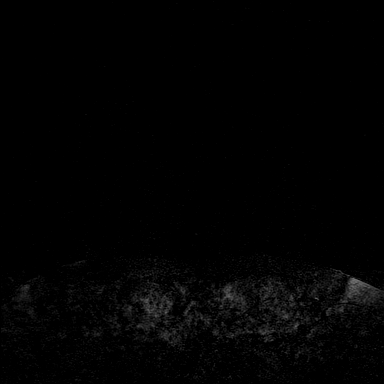

[Series 6: fl3d post-cm 20 · axial · 1.2mm · 1.04mm/px · z∈[-127,+64]mm · 5 of 160 slices shown (2 of 3)]
[im 1/160]
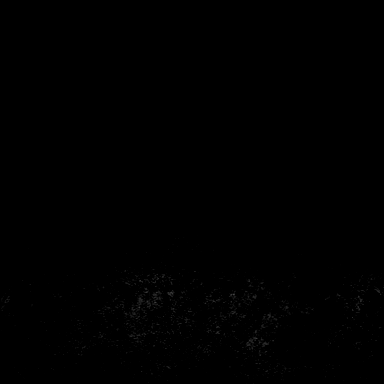
[im 40/160]
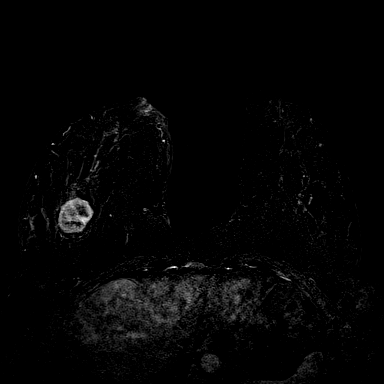
[im 80/160]
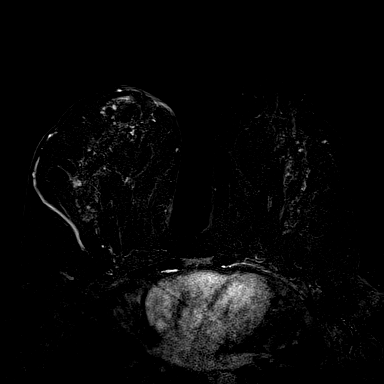
[im 120/160]
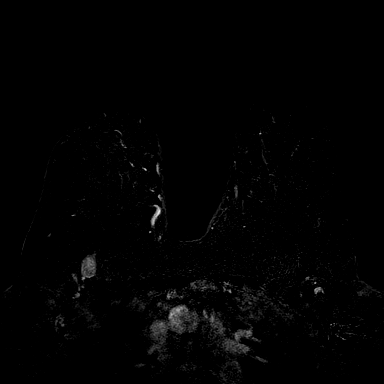
[im 160/160]
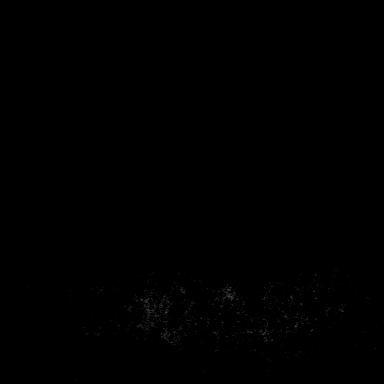

[Series 7: fl3d post-cm 20 · axial · 192.0mm · 1.04mm/px · 1 of 1 slices shown (3 of 3)]
[im 1/1]
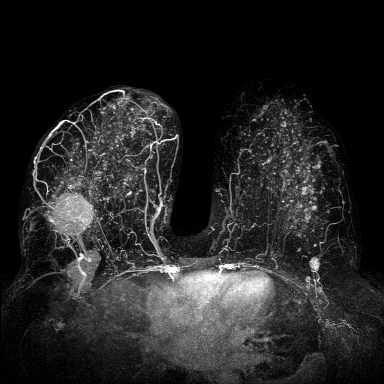

[Series 8: fl3d post-cm 3min · axial · 1.2mm · 1.04mm/px · z∈[-127,+64]mm · 5 of 160 slices shown]
[im 1/160]
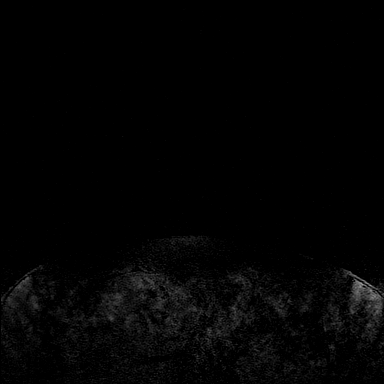
[im 40/160]
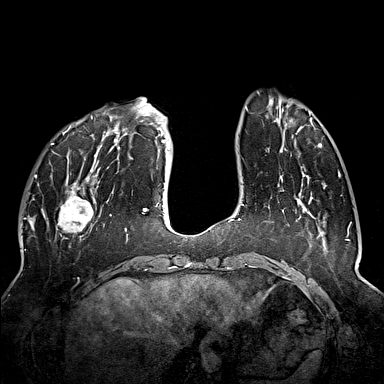
[im 80/160]
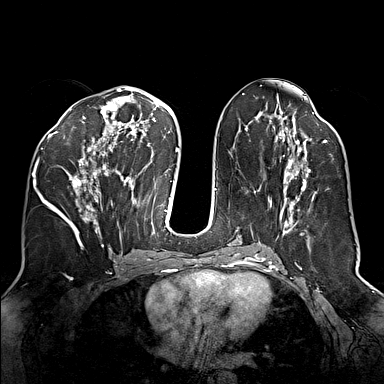
[im 120/160]
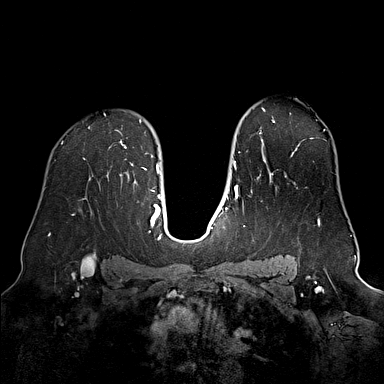
[im 160/160]
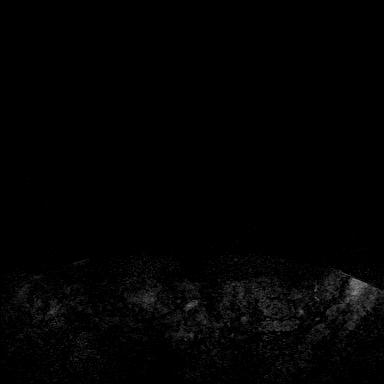

[Series 9: fl3d post-cm 3min_sub · axial · 1.2mm · 1.04mm/px · z∈[-127,-13]mm · 4 of 160 slices shown]
[im 1/160]
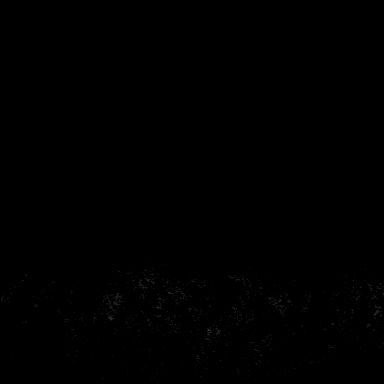
[im 32/160]
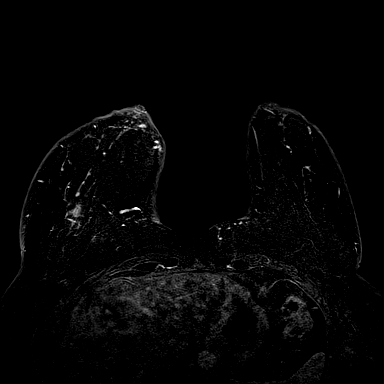
[im 64/160]
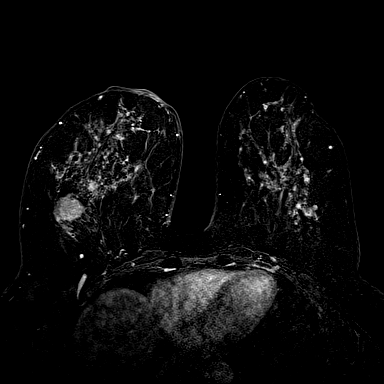
[im 96/160]
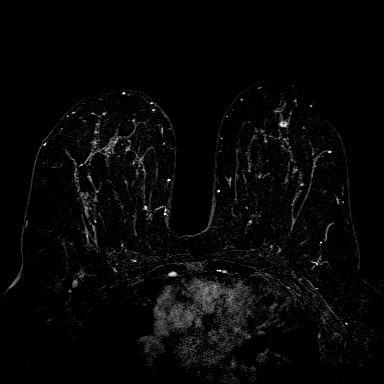

[31 of 48 positions shown; findings below may reference images not displayed]

Three-dimensional MR images were rendered by post-processing of the
original MR data on an independent workstation. The
three-dimensional MR images were interpreted, and findings are
reported in the following complete MRI report for this study. Three
dimensional images were evaluated at the independent interpreting
workstation using the DynaCAD thin client.
FINDINGS: Breast composition: b. Scattered fibroglandular tissue.

Background parenchymal enhancement: Marked

Right breast: There is an enhancing mass in the right breast
laterally measuring 4.7 x 4.2 x 4.4 cm. The mass lies posteriorly,
projecting at 8-9 o'clock. Post biopsy clip artifact lies along the
mass's superior margin. There is non mass-like enhancement along the
lateral margin, which likely reflects post biopsy change.

There are no other masses. Post biopsy clip artifact is also noted
in the anterior, upper outer right breast from the recent benign
biopsy.

There is significant periareolar skin thickening, which shows
increased T2 signal consistent with edema, but also demonstrates
mild enhancement.

Left breast: There is a small enhancing mass in the upper outer
quadrant between 2 and 3 o'clock, middle depth, measuring 1.1 x
x 0.8 cm. This mass shows some washout kinetics, but is
predominantly plateau type kinetics. There are no other left breast
masses or areas of abnormal enhancement.

Lymph nodes: 2 adjacent enlarged lymph nodes are noted in the right
axilla with maximal cortical thickness is measured at 18 and 12 mm.
No other enlarged or abnormal appearing lymph nodes on the right. No
abnormal lymph nodes on the left.

Ancillary findings:  None.
IMPRESSION: 1. 4.7 x 4.2 x 4.4 cm mass in the 8-9 o'clock position of the
posterior right breast represents the recently diagnosed malignancy.
2. Two adjacent abnormal right axillary lymph nodes, 1 of which
represents the biopsy-proven metastatic node, the other is also
consistent with metastatic lymphadenopathy.
3. Significant skin thickening in the periareolar right breast, with
significant increased T2 signal consistent with edema. There is also
mild abnormal enhancement within the thickened skin, which is
nonspecific. Consider skin punch biopsy.
4. 1.1 cm, nonspecific enhancing mass in the 2 to 3 o'clock position
of the left breast.

RECOMMENDATION:
1. Recommend MRI guided biopsy of the 1.1 cm enhancing mass in the
lateral left at 2-3 o'clock.

BI-RADS CATEGORY  4: Suspicious.

## 2022-09-15 DIAGNOSIS — Z419 Encounter for procedure for purposes other than remedying health state, unspecified: Secondary | ICD-10-CM | POA: Diagnosis not present

## 2022-10-16 DIAGNOSIS — Z419 Encounter for procedure for purposes other than remedying health state, unspecified: Secondary | ICD-10-CM | POA: Diagnosis not present

## 2022-11-15 DIAGNOSIS — Z419 Encounter for procedure for purposes other than remedying health state, unspecified: Secondary | ICD-10-CM | POA: Diagnosis not present

## 2022-12-13 ENCOUNTER — Other Ambulatory Visit: Payer: Self-pay | Admitting: Cardiology

## 2022-12-13 DIAGNOSIS — I10 Essential (primary) hypertension: Secondary | ICD-10-CM

## 2022-12-16 DIAGNOSIS — Z419 Encounter for procedure for purposes other than remedying health state, unspecified: Secondary | ICD-10-CM | POA: Diagnosis not present

## 2022-12-26 ENCOUNTER — Other Ambulatory Visit: Payer: Self-pay

## 2022-12-26 DIAGNOSIS — I10 Essential (primary) hypertension: Secondary | ICD-10-CM

## 2022-12-26 MED ORDER — LOSARTAN POTASSIUM 50 MG PO TABS: 50.0000 mg | ORAL_TABLET | Freq: Every evening | ORAL | 0 refills | Status: DC

## 2022-12-31 ENCOUNTER — Encounter: Payer: Self-pay | Admitting: Hematology and Oncology

## 2023-01-07 ENCOUNTER — Ambulatory Visit: Payer: No Typology Code available for payment source | Admitting: Cardiology

## 2023-01-08 ENCOUNTER — Encounter: Payer: Self-pay | Admitting: Hematology and Oncology

## 2023-01-14 ENCOUNTER — Other Ambulatory Visit: Payer: Self-pay

## 2023-01-14 DIAGNOSIS — C50511 Malignant neoplasm of lower-outer quadrant of right female breast: Secondary | ICD-10-CM

## 2023-01-15 ENCOUNTER — Other Ambulatory Visit: Payer: Self-pay | Admitting: Hematology and Oncology

## 2023-01-15 ENCOUNTER — Inpatient Hospital Stay: Payer: No Typology Code available for payment source

## 2023-01-15 ENCOUNTER — Inpatient Hospital Stay: Payer: No Typology Code available for payment source | Admitting: Adult Health

## 2023-01-15 DIAGNOSIS — R928 Other abnormal and inconclusive findings on diagnostic imaging of breast: Secondary | ICD-10-CM

## 2023-01-16 DIAGNOSIS — Z419 Encounter for procedure for purposes other than remedying health state, unspecified: Secondary | ICD-10-CM | POA: Diagnosis not present

## 2023-01-21 ENCOUNTER — Ambulatory Visit
Admission: RE | Admit: 2023-01-21 | Discharge: 2023-01-21 | Disposition: A | Discharge status: Home / Self Care | Payer: No Typology Code available for payment source | Source: Ambulatory Visit | Attending: Hematology and Oncology | Admitting: Hematology and Oncology

## 2023-01-21 DIAGNOSIS — R928 Other abnormal and inconclusive findings on diagnostic imaging of breast: Secondary | ICD-10-CM

## 2023-02-02 ENCOUNTER — Ambulatory Visit (HOSPITAL_COMMUNITY): Admission: RE | Admit: 2023-02-02 | Payer: No Typology Code available for payment source | Source: Ambulatory Visit

## 2023-02-03 ENCOUNTER — Encounter: Payer: Self-pay | Admitting: Adult Health

## 2023-02-03 ENCOUNTER — Other Ambulatory Visit: Payer: Self-pay

## 2023-02-03 ENCOUNTER — Inpatient Hospital Stay: Payer: No Typology Code available for payment source | Attending: Adult Health

## 2023-02-03 ENCOUNTER — Inpatient Hospital Stay (HOSPITAL_BASED_OUTPATIENT_CLINIC_OR_DEPARTMENT_OTHER): Payer: No Typology Code available for payment source | Admitting: Adult Health

## 2023-02-03 VITALS — BP 130/76 | HR 77 | Temp 97.9°F | Resp 16 | Ht 64.0 in | Wt 247.7 lb

## 2023-02-03 DIAGNOSIS — C50511 Malignant neoplasm of lower-outer quadrant of right female breast: Secondary | ICD-10-CM

## 2023-02-03 DIAGNOSIS — Z853 Personal history of malignant neoplasm of breast: Secondary | ICD-10-CM | POA: Diagnosis present

## 2023-02-03 DIAGNOSIS — C50411 Malignant neoplasm of upper-outer quadrant of right female breast: Secondary | ICD-10-CM | POA: Diagnosis not present

## 2023-02-03 DIAGNOSIS — Z923 Personal history of irradiation: Secondary | ICD-10-CM | POA: Insufficient documentation

## 2023-02-03 DIAGNOSIS — Z9221 Personal history of antineoplastic chemotherapy: Secondary | ICD-10-CM | POA: Insufficient documentation

## 2023-02-03 DIAGNOSIS — K862 Cyst of pancreas: Secondary | ICD-10-CM | POA: Diagnosis not present

## 2023-02-03 DIAGNOSIS — Z170 Estrogen receptor positive status [ER+]: Secondary | ICD-10-CM | POA: Diagnosis not present

## 2023-02-03 DIAGNOSIS — Z1211 Encounter for screening for malignant neoplasm of colon: Secondary | ICD-10-CM

## 2023-02-03 LAB — CBC WITH DIFFERENTIAL (CANCER CENTER ONLY)
Abs Immature Granulocytes: 0.02 10*3/uL (ref 0.00–0.07)
Basophils Absolute: 0.1 10*3/uL (ref 0.0–0.1)
Basophils Relative: 1 %
Eosinophils Absolute: 0.2 10*3/uL (ref 0.0–0.5)
Eosinophils Relative: 2 %
HCT: 38.4 % (ref 36.0–46.0)
Hemoglobin: 13.2 g/dL (ref 12.0–15.0)
Immature Granulocytes: 0 %
Lymphocytes Relative: 18 %
Lymphs Abs: 1.4 10*3/uL (ref 0.7–4.0)
MCH: 32.4 pg (ref 26.0–34.0)
MCHC: 34.4 g/dL (ref 30.0–36.0)
MCV: 94.1 fL (ref 80.0–100.0)
Monocytes Absolute: 0.5 10*3/uL (ref 0.1–1.0)
Monocytes Relative: 6 %
Neutro Abs: 5.7 10*3/uL (ref 1.7–7.7)
Neutrophils Relative %: 73 %
Platelet Count: 266 10*3/uL (ref 150–400)
RBC: 4.08 MIL/uL (ref 3.87–5.11)
RDW: 14.9 % (ref 11.5–15.5)
WBC Count: 7.7 10*3/uL (ref 4.0–10.5)
nRBC: 0 % (ref 0.0–0.2)

## 2023-02-03 LAB — CMP (CANCER CENTER ONLY)
ALT: 12 U/L (ref 0–44)
AST: 13 U/L — ABNORMAL LOW (ref 15–41)
Albumin: 3.8 g/dL (ref 3.5–5.0)
Alkaline Phosphatase: 53 U/L (ref 38–126)
Anion gap: 6 (ref 5–15)
BUN: 5 mg/dL — ABNORMAL LOW (ref 6–20)
CO2: 27 mmol/L (ref 22–32)
Calcium: 8 mg/dL — ABNORMAL LOW (ref 8.9–10.3)
Chloride: 107 mmol/L (ref 98–111)
Creatinine: 0.78 mg/dL (ref 0.44–1.00)
GFR, Estimated: >60 mL/min (ref >60)
Glucose, Bld: 115 mg/dL — ABNORMAL HIGH (ref 70–99)
Potassium: 3.8 mmol/L (ref 3.5–5.1)
Sodium: 140 mmol/L (ref 135–145)
Total Bilirubin: 0.6 mg/dL (ref 0.3–1.2)
Total Protein: 6.4 g/dL — ABNORMAL LOW (ref 6.5–8.1)

## 2023-02-03 NOTE — Progress Notes (Signed)
Diane Cancer Follow up:    Diane Docker, MD [address] [place] [address]   DIAGNOSIS:  Cancer Staging  Malignant neoplasm of upper-outer quadrant of right breast in female, estrogen receptor positive ([place]) Staging form: Breast, AJCC 8th Edition - Clinical: Stage IIB (cT3, cN1, cM0, G3, ER+, PR+, HER2+) - Signed by Chauncey Cruel, MD on 03/07/2021 Histologic grading system: 3 grade system   SUMMARY OF ONCOLOGIC HISTORY: Oncology History  Malignant neoplasm of upper-outer quadrant of right breast in female, estrogen receptor positive ([name])  02/23/2021 Initial Diagnosis   [place] woman status post right breast upper outer quadrant biopsy 02/23/2021 for a clinical T3 N1, stage IIB invasive ductal carcinoma, grade 3, triple positive, with an MIB-1 of 35%             (a) right periareolar biopsy 02/23/2021 was benign/concordant             (b) left breast nodule biopsy 03/14/2021 benign             (c) Punch biopsy of the right areolar area 03/22/2021 no malignancy             (d) CT of the chest abdomen and pelvis 03/20/2021 shows no evidence of metastatic disease; there is 2.5 cm cystic pancreatic mass and a 0.4 cm right liver lobe lesion requiring further follow-up             (e) bone scan 03/25/2021 shows no bone metastases   03/07/2021 Cancer Staging   Staging form: Breast, AJCC 8th Edition - Clinical: Stage IIB (cT3, cN1, cM0, G3, ER+, PR+, HER2+) - Signed by Chauncey Cruel, MD on 03/07/2021 Histologic grading system: 3 grade system   03/26/2021 Genetic Testing   Negative hereditary cancer genetic testing: no pathogenic variants detected in [facility] CustomNext-Cancer +RNAinsight Panel.  The report date is March 26, 2021.    The CustomNext-Cancer+RNAinsight panel offered by [facility] includes sequencing and rearrangement analysis for the following 47 genes:  APC, ATM, AXIN2, BARD1, BMPR1A, BRCA1, BRCA2, BRIP1, CDH1, CDK4,  CDKN2A, CHEK2, DICER1, EPCAM, GREM1, HOXB13, MEN1, MLH1, MSH2, MSH3, MSH6, MUTYH, NBN, NF1, NF2, NTHL1, PALB2, PMS2, POLD1, POLE, PTEN, RAD51C, RAD51D, RECQL, RET, SDHA, SDHAF2, SDHB, SDHC, SDHD, SMAD4, SMARCA4, STK11, TP53, TSC1, TSC2, and VHL.  RNA data is routinely analyzed for use in variant interpretation for all genes.   03/28/2021 - 07/10/2021 Neo-Adjuvant Chemotherapy   neoadjuvant chemotherapy consisting of carboplatin, docetaxel, trastuzumab and pertuzumab every 21 days x 6 beginning 03/28/2021, completed 07/10/2021    07/31/2021 - 03/12/2022 Chemotherapy   Patient is on Treatment Plan : BREAST Trastuzumab + Pertuzumab q21d     08/29/2021 Surgery   right lumpectomy and sentinel lymph node sampling 08/29/2021 showed no residual invasive disease in the breast or lymph nodes, rare foci of residual DCIS the largest measuring 0.1 cm, with negative margins:ypT(is) ypN0             (a) 5 right  axillary lymph nodes were removed   10/09/2021 - 11/29/2021 Radiation Therapy   Site Technique Total Dose (Gy) Dose per Fx (Gy) Completed Fx Beam Energies  Breast, Right: Breast_Rt 3D 50.4/50.4 1.8 28/28 15X  Breast, Right: Breast_Rt_Bst 3D 12/12 2 6/6 6X, 10X  Sclav-RT: SCV_Rt 3D 50.4/50.4 1.8 28/28 10X, 15X     12/2021 -  Anti-estrogen oral therapy   Recommend to patient and patient declines taking antiestrogen therapy.     CURRENT THERAPY: Observation  INTERVAL HISTORY: Diane Miranda 47 y.o. female returns for follow-up of her history of breast cancer.  She continues to do well.  Her blood pressure was previously elevated in past visits today it is under control and she is seeing Dr. Rolly Salter in cardiology.  She continues to work at [employer] about 40 hours a week and is taking care of her kids.  She notes that she is doing much better than she was last year and has no concerns.  Her most recent mammogram occurred on January 21, 2023 and showed no evidence of malignancy.  She  continues to decline antiestrogen therapy and tells me that she needs her MRCP to follow-up her pancreatic cyst reordered because it was scheduled for yesterday and was unable to get this completed due to the order being incorrect.   Patient Active Problem List   Diagnosis Date Noted   Genetic testing 03/27/2021   Family history of uterine cancer 03/08/2021   Family history of lung cancer 03/08/2021   Malignant neoplasm of upper-outer quadrant of right breast in female, estrogen receptor positive ([place]) 03/01/2021   Hypertension 01/16/2021   LOW HDL 08/03/2008   HSV 08/01/2008   OBESITY 08/01/2008   VAGINITIS 08/01/2008   HEADACHE 08/01/2008    is allergic to hydrochlorothiazide.  MEDICAL HISTORY: Past Medical History:  Diagnosis Date   Allergy 2000   Asthma    as child   Breast cancer ([place])    Family history of lung cancer 03/08/2021   Family history of uterine cancer 03/08/2021   GERD (gastroesophageal reflux disease)    Headache    History of COVID-19    History of radiation therapy    Right breast- 10/09/21-11/29/21- Dr. Gery Pray   Hyperlipidemia    Hypertension    Port-A-Cath in place 09/11/2021   Pre-diabetes    Prediabetes    right breast ca dx'd 02/2021   Trimalleolar fracture of ankle, closed, left, initial encounter     SURGICAL HISTORY: Past Surgical History:  Procedure Laterality Date   BREAST BIOPSY Right 03/22/2021   Procedure: PUNCH BIOPSY OF RIGHT AREOLA;  Surgeon: Stark Klein, MD;  Location: [facility];  Service: General;  Laterality: Right;   BREAST LUMPECTOMY WITH RADIOACTIVE SEED AND SENTINEL LYMPH NODE BIOPSY Right 08/29/2021   Procedure: RIGHT BREAST LUMPECTOMY WITH RADIOACTIVE SEED AND RIGHT AXILLARY SENTINEL LYMPH NODE BIOPSY;  Surgeon: Stark Klein, MD;  Location: [facility];  Service: General;  Laterality: Right;   FRACTURE SURGERY  2018   NO PAST SURGERIES     ORIF ANKLE FRACTURE Left 07/10/2017   Procedure: OPEN REDUCTION  INTERNAL FIXATION (ORIF) trimallolar ANKLE FRACTURE;  Surgeon: Wylene Simmer, MD;  Location: [facility];  Service: Orthopedics;  Laterality: Left;   PORT-A-CATH REMOVAL N/A 06/26/2022   Procedure: PORT REMOVAL;  Surgeon: Stark Klein, MD;  Location: [facility];  Service: General;  Laterality: N/A;   PORTACATH PLACEMENT Left 03/22/2021   Procedure: INSERTION PORT-A-CATH;  Surgeon: Stark Klein, MD;  Location: [facility];  Service: General;  Laterality: Left;   RADIOACTIVE SEED GUIDED AXILLARY SENTINEL LYMPH NODE Right 08/29/2021   Procedure: RADIOACTIVE SEED GUIDED AXILLARY SENTINEL LYMPH NODE EXCISION;  Surgeon: Stark Klein, MD;  Location: [facility];  Service: General;  Laterality: Right;    SOCIAL HISTORY: Social History   Socioeconomic History   Marital status: Single    Spouse name: Not on file   Number of children: 3   Years of  education: Not on file   Highest education level: Not on file  Occupational History   Not on file  Tobacco Use   Smoking status: Never   Smokeless tobacco: Never  Vaping Use   Vaping Use: Never used  Substance and Sexual Activity   Alcohol use: Yes    Comment: social   Drug use: No   Sexual activity: Yes    Birth control/protection: I.U.D.  Other Topics Concern   Not on file  Social History Narrative   Not on file   Social Determinants of Health   Financial Resource Strain: Not on file  Food Insecurity: Not on file  Transportation Needs: Not on file  Physical Activity: Not on file  Stress: Not on file  Social Connections: Not on file  Intimate Partner Violence: Not on file    FAMILY HISTORY: Family History  Problem Relation Age of Onset   Early death Mother    Heart disease Mother    Hypertension Mother    Diabetes Father    Hypertension Father    Diabetes Brother    Hearing loss Maternal Grandmother    Hypertension Maternal Grandmother    Uterine cancer Other 67       MGM's sister   Throat cancer Other         MGM's brother; dx after 26   Lung cancer Other        PGM's sister; dx 28s    Review of Systems  Constitutional:  Negative for appetite change, chills, fatigue, fever and unexpected weight change.  HENT:   Negative for hearing loss, lump/mass and trouble swallowing.   Eyes:  Negative for eye problems and icterus.  Respiratory:  Negative for chest tightness, cough and shortness of breath.   Cardiovascular:  Negative for chest pain, leg swelling and palpitations.  Gastrointestinal:  Negative for abdominal distention, abdominal pain, constipation, diarrhea, nausea and vomiting.  Endocrine: Negative for hot flashes.  Genitourinary:  Negative for difficulty urinating.   Musculoskeletal:  Negative for arthralgias.  Skin:  Negative for itching and rash.  Neurological:  Negative for dizziness, extremity weakness, headaches and numbness.  Hematological:  Negative for adenopathy. Does not bruise/bleed easily.  Psychiatric/Behavioral:  Negative for depression. The patient is not nervous/anxious.       PHYSICAL EXAMINATION  ECOG PERFORMANCE STATUS: 0 - Asymptomatic  Vitals:   02/03/23 1130  BP: 130/76  Pulse: 77  Resp: 16  Temp: 97.9 F (36.6 C)  SpO2: 99%    Physical Exam Constitutional:      General: She is not in acute distress.    Appearance: Normal appearance. She is not toxic-appearing.  HENT:     Head: Normocephalic and atraumatic.  Eyes:     General: No scleral icterus. Cardiovascular:     Rate and Rhythm: Normal rate and regular rhythm.     Pulses: Normal pulses.     Heart sounds: Normal heart sounds.  Pulmonary:     Effort: Pulmonary effort is normal.     Breath sounds: Normal breath sounds.  Chest:     Comments: Left breast is benign.  Right breast status postlumpectomy and radiation there is no sign of local recurrence.  There is a small fluctuant area consistent with the seroma seen on her mammogram. Abdominal:     General: Abdomen is flat. Bowel sounds are  normal. There is no distension.     Palpations: Abdomen is soft.     Tenderness: There is no abdominal tenderness.  Musculoskeletal:        General: No swelling.     Cervical back: Neck supple.  Lymphadenopathy:     Cervical: No cervical adenopathy.  Skin:    General: Skin is warm and dry.     Findings: No rash.  Neurological:     General: No focal deficit present.     Mental Status: She is alert.  Psychiatric:        Mood and Affect: Mood normal.        Behavior: Behavior normal.     LABORATORY DATA: Labs are pending for today's visit.   ASSESSMENT and THERAPY PLAN:   Malignant neoplasm of upper-outer quadrant of right breast in female, estrogen receptor positive ([place]) Diane Miranda is a 47 year old woman with history of stage IIb triple positive breast cancer diagnosed in March 2022.  She is status post neoadjuvant chemotherapy followed by lumpectomy, maintenance trastuzumab and pertuzumab, adjuvant radiation that completed in 2022.  She was recommended antiestrogen therapy and continues to choose not to partake in this.  Diane Miranda has no clinical or radiographic signs of breast cancer recurrence.  She will continue on observation and will undergo mammograms annually next due in February 2025.  Since she is not taking any antiestrogen therapy we reviewed her BMI of 42 and that in addition to making estrogen from ovaries women also make estrogen from fat cells and adrenal glands.  I suggested she look at opportunities to lose weight and reach out to me if she needed any referrals to a weight loss clinic.  She is going to look into this and let me know.  I also reviewed American Cancer Society study which indicated that women who exercise 150 to 300 minutes a week have a lower rate of breast cancer recurrence.  Diane Miranda is 48 years old and is recommended to undergo screening colonoscopy.  She has not undergone this and I placed a referral for her to do so today.  She will return in 6  months for labs and follow-up and knows to call for any questions or concerns that may arise between now and her next visit.     All questions were answered. The patient knows to call the clinic with any problems, questions or concerns. We can certainly see the patient much sooner if necessary.  Total encounter time:30 minutes*in face-to-face visit time, chart review, lab review, care coordination, order entry, and documentation of the encounter time.    Wilber Bihari, NP 02/03/23 12:09 PM Medical Oncology and Hematology [facility] [place], [place] [address] Tel. [phone]    Fax. [phone]  *Total Encounter Time as defined by the Centers for Medicare and Medicaid Services includes, in addition to the face-to-face time of a patient visit (documented in the note above) non-face-to-face time: obtaining and reviewing outside history, ordering and reviewing medications, tests or procedures, care coordination (communications with other health care professionals or caregivers) and documentation in the medical record.

## 2023-02-03 NOTE — Assessment & Plan Note (Signed)
Diane Miranda is a 47 year old woman with history of stage IIb triple positive breast cancer diagnosed in March 2022.  She is status post neoadjuvant chemotherapy followed by lumpectomy, maintenance trastuzumab and pertuzumab, adjuvant radiation that completed in 2022.  She was recommended antiestrogen therapy and continues to choose not to partake in this.  Diane Miranda has no clinical or radiographic signs of breast cancer recurrence.  She will continue on observation and will undergo mammograms annually next due in February 2025.  Since she is not taking any antiestrogen therapy we reviewed her BMI of 42 and that in addition to making estrogen from ovaries women also make estrogen from fat cells and adrenal glands.  I suggested she look at opportunities to lose weight and reach out to me if she needed any referrals to a weight loss clinic.  She is going to look into this and let me know.  I also reviewed American Cancer Society study which indicated that women who exercise 150 to 300 minutes a week have a lower rate of breast cancer recurrence.  Diane Miranda is 47 years old and is recommended to undergo screening colonoscopy.  She has not undergone this and I placed a referral for her to do so today.  She will return in 6 months for labs and follow-up and knows to call for any questions or concerns that may arise between now and her next visit.

## 2023-02-04 ENCOUNTER — Telehealth: Payer: Self-pay | Admitting: Adult Health

## 2023-02-04 NOTE — Telephone Encounter (Signed)
Scheduled appointment per 2/19 los. Patient is aware of the made appointment.

## 2023-02-10 ENCOUNTER — Ambulatory Visit (HOSPITAL_COMMUNITY)
Admission: RE | Admit: 2023-02-10 | Discharge: 2023-02-10 | Disposition: A | Discharge status: Home / Self Care | Payer: No Typology Code available for payment source | Source: Ambulatory Visit | Attending: Hematology and Oncology | Admitting: Hematology and Oncology

## 2023-02-10 ENCOUNTER — Other Ambulatory Visit: Payer: Self-pay | Admitting: Adult Health

## 2023-02-10 DIAGNOSIS — Z170 Estrogen receptor positive status [ER+]: Secondary | ICD-10-CM

## 2023-02-10 DIAGNOSIS — K862 Cyst of pancreas: Secondary | ICD-10-CM | POA: Diagnosis present

## 2023-02-10 DIAGNOSIS — C50411 Malignant neoplasm of upper-outer quadrant of right female breast: Secondary | ICD-10-CM | POA: Insufficient documentation

## 2023-02-10 MED ORDER — GADOBUTROL 1 MMOL/ML IV SOLN
10.0000 mL | Freq: Once | INTRAVENOUS | Status: AC | PRN
  Administered 2023-02-10: 10 mL via INTRAVENOUS

## 2023-02-13 ENCOUNTER — Telehealth: Payer: Self-pay

## 2023-02-13 ENCOUNTER — Other Ambulatory Visit: Payer: Self-pay | Admitting: Adult Health

## 2023-02-13 DIAGNOSIS — K862 Cyst of pancreas: Secondary | ICD-10-CM

## 2023-02-13 DIAGNOSIS — Z170 Estrogen receptor positive status [ER+]: Secondary | ICD-10-CM

## 2023-02-13 NOTE — Telephone Encounter (Signed)
Called and given below message. Pt verbalized understanding with no further questions at this time.

## 2023-02-13 NOTE — Progress Notes (Signed)
Repeat MRI ordered for 1 year from now to follow-up on pancreatic cyst  Diane Bihari, NP 02/13/23 2:56 PM Medical Oncology and Hematology [facility] [name], [name] [address] Tel. [phone]    Fax. [phone]

## 2023-02-13 NOTE — Telephone Encounter (Signed)
-----   Message from Gardenia Phlegm, NP sent at 02/13/2023  2:54 PM EST ----- Please let her know that her cyst is smaller, and likely due to her pancreatitis.  Repeat is recommended in 1 years time.   ----- Message ----- From: Interface, Rad Results In Sent: 02/11/2023   5:01 PM EST To: Gardenia Phlegm, NP

## 2023-02-14 DIAGNOSIS — Z419 Encounter for procedure for purposes other than remedying health state, unspecified: Secondary | ICD-10-CM | POA: Diagnosis not present

## 2023-02-23 ENCOUNTER — Other Ambulatory Visit: Payer: Self-pay | Admitting: Cardiology

## 2023-02-23 DIAGNOSIS — I10 Essential (primary) hypertension: Secondary | ICD-10-CM

## 2023-02-28 ENCOUNTER — Encounter: Payer: No Typology Code available for payment source | Admitting: Physician Assistant

## 2023-02-28 ENCOUNTER — Inpatient Hospital Stay: Payer: No Typology Code available for payment source | Attending: Adult Health | Admitting: Physician Assistant

## 2023-02-28 ENCOUNTER — Telehealth: Payer: Self-pay | Admitting: *Deleted

## 2023-02-28 ENCOUNTER — Other Ambulatory Visit: Payer: Self-pay

## 2023-02-28 VITALS — BP 147/89 | HR 75 | Temp 99.5°F | Resp 16

## 2023-02-28 DIAGNOSIS — R21 Rash and other nonspecific skin eruption: Secondary | ICD-10-CM

## 2023-02-28 DIAGNOSIS — Z853 Personal history of malignant neoplasm of breast: Secondary | ICD-10-CM | POA: Diagnosis not present

## 2023-02-28 DIAGNOSIS — C50411 Malignant neoplasm of upper-outer quadrant of right female breast: Secondary | ICD-10-CM

## 2023-02-28 DIAGNOSIS — Z800 Family history of malignant neoplasm of digestive organs: Secondary | ICD-10-CM | POA: Diagnosis not present

## 2023-02-28 DIAGNOSIS — Z801 Family history of malignant neoplasm of trachea, bronchus and lung: Secondary | ICD-10-CM | POA: Insufficient documentation

## 2023-02-28 DIAGNOSIS — Z170 Estrogen receptor positive status [ER+]: Secondary | ICD-10-CM

## 2023-02-28 DIAGNOSIS — R590 Localized enlarged lymph nodes: Secondary | ICD-10-CM

## 2023-02-28 DIAGNOSIS — C50511 Malignant neoplasm of lower-outer quadrant of right female breast: Secondary | ICD-10-CM

## 2023-02-28 DIAGNOSIS — Z8049 Family history of malignant neoplasm of other genital organs: Secondary | ICD-10-CM | POA: Diagnosis not present

## 2023-02-28 MED ORDER — AMOXICILLIN-POT CLAVULANATE 875-125 MG PO TABS: 1.0000 | ORAL_TABLET | Freq: Two times a day (BID) | ORAL | 0 refills | Status: AC

## 2023-02-28 MED ORDER — VALACYCLOVIR HCL 1 G PO TABS: 1000.0000 mg | ORAL_TABLET | Freq: Three times a day (TID) | ORAL | 0 refills | Status: AC

## 2023-02-28 NOTE — Telephone Encounter (Signed)
This RN spoke with pt per her call stating onset earlier this week "of what I thought was an allergic reaction to a blood orange" - she went to a local urgent care and received a steroid injection.  She states she has enlarged lymph nodes in her cervical and clavicle area "that have not improved and I am concerned "  Note pt has hx of stage 2B  triple positive invasive breast cancer s/p chemo lumpectomy and radiation but declined antiestrogens.  She denies any enlarged lymph nodes in the axilla.  Per discussion with [facility] providers appt made for visit today.

## 2023-02-28 NOTE — Progress Notes (Signed)
Symptom Management Consult Note [facility]    Patient Care Team: Pavelock, Ralene Bathe, MD as PCP - General (Internal Medicine) Stark Klein, MD as Consulting Physician (General Surgery) Gery Pray, MD as Consulting Physician (Radiation Oncology) Wylene Simmer, MD as Consulting Physician (Orthopedic Surgery) Benay Pike, MD as Consulting Physician (Hematology and Oncology) Rex Kras, DO as Consulting Physician (Cardiology)    Name / MRN / DOB: Diane Miranda  [id number]  1976-01-07   Date of visit: 02/28/2023   Chief Complaint/Reason for visit: swollen lymph node and rash   Current Therapy: Observation    ASSESSMENT & PLAN: Patient is a 47 y.o. female  with oncologic history of malignant neoplasm of upper-outer quadrant of right breast in female, estrogen receptor positive  followed by Dr. Chryl Heck.  I have viewed most recent oncology note and lab work.    #Malignant neoplasm of right breast in female, estrogen receptor positive  - Patient declines antiestrogen therapy per previous oncology notes. - Mammogram 01/21/23 with no evidence of malignancy - Next appointment with oncologist is 08/05/23   #Lymphadenopathy -Right preauricular and submandibular lymphadenopathy tender to palpation. - Had preceding URI vs sinus symptoms.  - Low grade temperature in clinic 99.5. Will treat patient with course of Augmentin. - She will RTC next week for recheck.    #Rash - ?If rash is consistent with shingles.  There is no eye involvement, nothing to suggest ophthalmic herpes zoster. Also unlikely to be shingles in the absence of pain.   - Engaged in shared decision making with patient.  Prescription sent to the pharmacy for Valtrex.  If patient develops new painful lesions in a dermatomal pattern then she should at that time start taking the Valtrex.  Otherwise Valtrex is not needed and she can apply bacitracin ointment. Discussed signs and symptoms of   ophthalmic herpes zoster and needing to see opthalmology or ED immediately for evaluation.    Heme/Onc History: Oncology History  Malignant neoplasm of upper-outer quadrant of right breast in female, estrogen receptor positive ([place])  02/23/2021 Initial Diagnosis   [place] woman status post right breast upper outer quadrant biopsy 02/23/2021 for a clinical T3 N1, stage IIB invasive ductal carcinoma, grade 3, triple positive, with an MIB-1 of 35%             (a) right periareolar biopsy 02/23/2021 was benign/concordant             (b) left breast nodule biopsy 03/14/2021 benign             (c) Punch biopsy of the right areolar area 03/22/2021 no malignancy             (d) CT of the chest abdomen and pelvis 03/20/2021 shows no evidence of metastatic disease; there is 2.5 cm cystic pancreatic mass and a 0.4 cm right liver lobe lesion requiring further follow-up             (e) bone scan 03/25/2021 shows no bone metastases   03/07/2021 Cancer Staging   Staging form: Breast, AJCC 8th Edition - Clinical: Stage IIB (cT3, cN1, cM0, G3, ER+, PR+, HER2+) - Signed by Chauncey Cruel, MD on 03/07/2021 Histologic grading system: 3 grade system   03/26/2021 Genetic Testing   Negative hereditary cancer genetic testing: no pathogenic variants detected in [facility] CustomNext-Cancer +RNAinsight Panel.  The report date is March 26, 2021.    The CustomNext-Cancer+RNAinsight panel offered by [facility] includes sequencing and rearrangement analysis for  the following 47 genes:  APC, ATM, AXIN2, BARD1, BMPR1A, BRCA1, BRCA2, BRIP1, CDH1, CDK4, CDKN2A, CHEK2, DICER1, EPCAM, GREM1, HOXB13, MEN1, MLH1, MSH2, MSH3, MSH6, MUTYH, NBN, NF1, NF2, NTHL1, PALB2, PMS2, POLD1, POLE, PTEN, RAD51C, RAD51D, RECQL, RET, SDHA, SDHAF2, SDHB, SDHC, SDHD, SMAD4, SMARCA4, STK11, TP53, TSC1, TSC2, and VHL.  RNA data is routinely analyzed for use in variant interpretation for all genes.   03/28/2021 - 07/10/2021 Neo-Adjuvant Chemotherapy    neoadjuvant chemotherapy consisting of carboplatin, docetaxel, trastuzumab and pertuzumab every 21 days x 6 beginning 03/28/2021, completed 07/10/2021    07/31/2021 - 03/12/2022 Chemotherapy   Patient is on Treatment Plan : BREAST Trastuzumab + Pertuzumab q21d     08/29/2021 Surgery   right lumpectomy and sentinel lymph node sampling 08/29/2021 showed no residual invasive disease in the breast or lymph nodes, rare foci of residual DCIS the largest measuring 0.1 cm, with negative margins:ypT(is) ypN0             (a) 5 right  axillary lymph nodes were removed   10/09/2021 - 11/29/2021 Radiation Therapy   Site Technique Total Dose (Gy) Dose per Fx (Gy) Completed Fx Beam Energies  Breast, Right: Breast_Rt 3D 50.4/50.4 1.8 28/28 15X  Breast, Right: Breast_Rt_Bst 3D 12/12 2 6/6 6X, 10X  Sclav-RT: SCV_Rt 3D 50.4/50.4 1.8 28/28 10X, 15X     12/2021 -  Anti-estrogen oral therapy   Recommend to patient and patient declines taking antiestrogen therapy.       Interval history-: Diane Miranda is a 47 y.o. female with oncologic history as above presenting to [facility] today with chief complaint of lymphadenopathy and rash. She presents unaccompanied to clinic today.  Patient states that for the last x 3 days she has had symptoms of a sinus infection.  She has swelling to lymph node by her right ear and under chin on the right side.  They are tender to the touch. She rates pain 5/10 in severity.  She also endorses some mild aching ear pain.  She frequently gets sinus infections and thought this might be one starting as it felt similar to prior ones. She did workout at a new boot camp outdoors closely around other people, unsure if she was exposed to anyone sick.  She has not had any fever. She has a sore throat although no difficulty swallowing or tolerating PO intake.  Patient also reports 4 days ago evening she ate a blood orange for the first time.  After eating a piece she rinsed her hands with a  water.  She then had an itch on the bridge of her nose which she scratched.  The itching continued and when she looked in the mirror she saw welts on the bridge of her nose.  She went to urgent care and received a steroid injection for allergic reaction and has been taking Benadryl as needed.  Last dose was Wednesday evening.  Benadryl does help with the itching.  She did not have any shortness of breath or wheezing.  She does have allergies to many fruits he reports.  The area that had once been welts is now red and scabbed.  There has been no drainage.  She denies any associated pain. No eye pain or visual changes.      ROS  All other systems are reviewed and are negative for acute change except as noted in the HPI.    Allergies  Allergen Reactions   Hydrochlorothiazide Anaphylaxis and Swelling     Past Medical History:  Diagnosis Date   Allergy 2000   Asthma    as child   Breast cancer ([place])    Family history of lung cancer 03/08/2021   Family history of uterine cancer 03/08/2021   GERD (gastroesophageal reflux disease)    Headache    History of COVID-19    History of radiation therapy    Right breast- 10/09/21-11/29/21- Dr. Gery Pray   Hyperlipidemia    Hypertension    Port-A-Cath in place 09/11/2021   Pre-diabetes    Prediabetes    right breast ca dx'd 02/2021   Trimalleolar fracture of ankle, closed, left, initial encounter      Past Surgical History:  Procedure Laterality Date   BREAST BIOPSY Right 03/22/2021   Procedure: PUNCH BIOPSY OF RIGHT AREOLA;  Surgeon: Stark Klein, MD;  Location: [place];  Service: General;  Laterality: Right;   BREAST LUMPECTOMY WITH RADIOACTIVE SEED AND SENTINEL LYMPH NODE BIOPSY Right 08/29/2021   Procedure: RIGHT BREAST LUMPECTOMY WITH RADIOACTIVE SEED AND RIGHT AXILLARY SENTINEL LYMPH NODE BIOPSY;  Surgeon: Stark Klein, MD;  Location: [place];  Service: General;  Laterality: Right;   FRACTURE SURGERY  2018   NO  PAST SURGERIES     ORIF ANKLE FRACTURE Left 07/10/2017   Procedure: OPEN REDUCTION INTERNAL FIXATION (ORIF) trimallolar ANKLE FRACTURE;  Surgeon: Wylene Simmer, MD;  Location: [place];  Service: Orthopedics;  Laterality: Left;   PORT-A-CATH REMOVAL N/A 06/26/2022   Procedure: PORT REMOVAL;  Surgeon: Stark Klein, MD;  Location: [place];  Service: General;  Laterality: N/A;   PORTACATH PLACEMENT Left 03/22/2021   Procedure: INSERTION PORT-A-CATH;  Surgeon: Stark Klein, MD;  Location: [place];  Service: General;  Laterality: Left;   RADIOACTIVE SEED GUIDED AXILLARY SENTINEL LYMPH NODE Right 08/29/2021   Procedure: RADIOACTIVE SEED GUIDED AXILLARY SENTINEL LYMPH NODE EXCISION;  Surgeon: Stark Klein, MD;  Location: [place];  Service: General;  Laterality: Right;    Social History   Socioeconomic History   Marital status: Single    Spouse name: Not on file   Number of children: 3   Years of education: Not on file   Highest education level: Not on file  Occupational History   Not on file  Tobacco Use   Smoking status: Never   Smokeless tobacco: Never  Vaping Use   Vaping Use: Never used  Substance and Sexual Activity   Alcohol use: Yes    Comment: social   Drug use: No   Sexual activity: Yes    Birth control/protection: I.U.D.  Other Topics Concern   Not on file  Social History Narrative   Not on file   Social Determinants of Health   Financial Resource Strain: Not on file  Food Insecurity: Not on file  Transportation Needs: Not on file  Physical Activity: Not on file  Stress: Not on file  Social Connections: Not on file  Intimate Partner Violence: Not on file    Family History  Problem Relation Age of Onset   Early death Mother    Heart disease Mother    Hypertension Mother    Diabetes Father    Hypertension Father    Diabetes Brother    Hearing loss Maternal Grandmother    Hypertension Maternal Grandmother    Uterine cancer Other 16        MGM's sister   Throat cancer Other        MGM's brother; dx after 26   Lung cancer  Other        PGM's sister; dx 9s     Current Outpatient Medications:    amoxicillin-clavulanate (AUGMENTIN) 875-125 MG tablet, Take 1 tablet by mouth 2 (two) times daily for 7 days., Disp: 14 tablet, Rfl: 0   valACYclovir (VALTREX) 1000 MG tablet, Take 1 tablet (1,000 mg total) by mouth 3 (three) times daily for 7 days., Disp: 21 tablet, Rfl: 0   acetaminophen (TYLENOL) 500 MG tablet, Take 1 tablet (500 mg total) by mouth 3 (three) times daily with meals as needed. Take with aleve 220 mg tablet, Disp: 90 tablet, Rfl: 0   fexofenadine (ALLEGRA) 60 MG tablet, Take 1 tablet by mouth daily as needed (allergies)., Disp: , Rfl:    gabapentin (NEURONTIN) 300 MG capsule, Take 1 capsule (300 mg total) by mouth at bedtime. (Patient not taking: Reported on 02/03/2023), Disp: 90 capsule, Rfl: 4   losartan (COZAAR) 50 MG tablet, Take 1 tablet (50 mg total) by mouth every evening., Disp: 90 tablet, Rfl: 0   metoprolol succinate (TOPROL-XL) 100 MG 24 hr tablet, TAKE 1 TABLET BY MOUTH EVERY DAY IN THE MORNING, Disp: 90 tablet, Rfl: 1   Multiple Vitamins-Minerals (MULTIVITAMIN WITH MINERALS) tablet, Take 1 tablet by mouth daily. Power, Disp: , Rfl:   PHYSICAL EXAM: ECOG FS:1 - Symptomatic but completely ambulatory    Vitals:   02/28/23 1224  BP: (!) 147/89  Pulse: 75  Resp: 16  Temp: 99.5 F (37.5 C)  TempSrc: Oral  SpO2: 100%   Physical Exam Vitals and nursing note reviewed.  Constitutional:      Appearance: She is well-developed. She is not ill-appearing or toxic-appearing.     Comments: Airway patent  HENT:     Head: Normocephalic. No right periorbital erythema or left periorbital erythema.     Right Ear: Tympanic membrane and external ear normal. Tympanic membrane is not erythematous or retracted.     Left Ear: Tympanic membrane and external ear normal. Tympanic membrane is not erythematous or  retracted.     Nose: Nose normal.     Mouth/Throat:     Pharynx: Uvula midline. No posterior oropharyngeal erythema.  Eyes:     General: Lids are normal.     Conjunctiva/sclera: Conjunctivae normal.  Neck:     Vascular: No JVD.  Cardiovascular:     Rate and Rhythm: Normal rate and regular rhythm.     Pulses: Normal pulses.     Heart sounds: Normal heart sounds.  Pulmonary:     Effort: Pulmonary effort is normal.     Breath sounds: Normal breath sounds.  Abdominal:     General: There is no distension.  Musculoskeletal:     Cervical back: Normal range of motion.  Lymphadenopathy:     Head:     Right side of head: Submandibular and preauricular adenopathy present.     Cervical: No cervical adenopathy.  Skin:    General: Skin is warm and dry.     Comments: Rash on bridge of nose. Please see media below  Neurological:     Mental Status: She is oriented to person, place, and time.        LABORATORY DATA: I have reviewed the data as listed    Latest Ref Rng & Units 02/03/2023   11:15 AM 07/15/2022    1:17 PM 06/26/2022   10:04 AM  CBC  WBC 4.0 - 10.5 K/uL 7.7  7.9  6.4   Hemoglobin 12.0 - 15.0 g/dL 13.2  13.1  13.2   Hematocrit 36.0 - 46.0 % 38.4  38.2  39.3   Platelets 150 - 400 K/uL 266  158  257         Latest Ref Rng & Units 02/03/2023   11:15 AM 07/15/2022    1:17 PM 06/26/2022   10:04 AM  CMP  Glucose 70 - 99 mg/dL 115  102  121   BUN 6 - 20 mg/dL 5  10  7    Creatinine 0.44 - 1.00 mg/dL 0.78  0.86  1.00   Sodium 135 - 145 mmol/L 140  139  139   Potassium 3.5 - 5.1 mmol/L 3.8  3.7  4.0   Chloride 98 - 111 mmol/L 107  108  110   CO2 22 - 32 mmol/L 27  26  23    Calcium 8.9 - 10.3 mg/dL 8.0  8.7  8.5   Total Protein 6.5 - 8.1 g/dL 6.4  6.6    Total Bilirubin 0.3 - 1.2 mg/dL 0.6  0.5    Alkaline Phos 38 - 126 U/L 53  49    AST 15 - 41 U/L 13  10    ALT 0 - 44 U/L 12  9         RADIOGRAPHIC STUDIES (from last 24 hours if applicable) I have personally  reviewed the radiological images as listed and agreed with the findings in the report. No results found.      Visit Diagnosis: 1. Malignant neoplasm of upper-outer quadrant of right breast in female, estrogen receptor positive ([place])   2. Malignant neoplasm of lower-outer quadrant of right breast of female, estrogen receptor positive ([facility])   3. Lymphadenopathy of head and neck region   4. Rash      No orders of the defined types were placed in this encounter.   All questions were answered. The patient knows to call the clinic with any problems, questions or concerns. No barriers to learning was detected.  I have spent a total of 30 minutes minutes of face-to-face and non-face-to-face time, preparing to see the patient, obtaining and/or reviewing separately obtained history, performing a medically appropriate examination, counseling and educating the patient, ordering tests, documenting clinical information in the electronic health record, and care coordination (communications with other health care professionals or caregivers).    Thank you for allowing me to participate in the care of this patient.    Barrie Folk, PA-C Department of Hematology/Oncology [facility] Phone: [phone]  [phone]    02/28/2023 2:25 PM

## 2023-03-06 ENCOUNTER — Inpatient Hospital Stay: Payer: No Typology Code available for payment source | Admitting: Physician Assistant

## 2023-03-17 DIAGNOSIS — Z419 Encounter for procedure for purposes other than remedying health state, unspecified: Secondary | ICD-10-CM | POA: Diagnosis not present

## 2023-03-24 ENCOUNTER — Other Ambulatory Visit: Payer: Self-pay | Admitting: Cardiology

## 2023-03-24 DIAGNOSIS — I10 Essential (primary) hypertension: Secondary | ICD-10-CM

## 2023-04-16 DIAGNOSIS — Z419 Encounter for procedure for purposes other than remedying health state, unspecified: Secondary | ICD-10-CM | POA: Diagnosis not present

## 2023-05-17 DIAGNOSIS — Z419 Encounter for procedure for purposes other than remedying health state, unspecified: Secondary | ICD-10-CM | POA: Diagnosis not present

## 2023-06-16 DIAGNOSIS — Z419 Encounter for procedure for purposes other than remedying health state, unspecified: Secondary | ICD-10-CM | POA: Diagnosis not present

## 2023-06-26 ENCOUNTER — Other Ambulatory Visit: Payer: Self-pay | Admitting: Cardiology

## 2023-06-26 DIAGNOSIS — I10 Essential (primary) hypertension: Secondary | ICD-10-CM

## 2023-07-17 DIAGNOSIS — Z419 Encounter for procedure for purposes other than remedying health state, unspecified: Secondary | ICD-10-CM | POA: Diagnosis not present

## 2023-08-05 ENCOUNTER — Inpatient Hospital Stay: Payer: No Typology Code available for payment source | Admitting: Hematology and Oncology

## 2023-08-05 ENCOUNTER — Inpatient Hospital Stay: Payer: No Typology Code available for payment source | Attending: Radiation Oncology

## 2023-08-17 DIAGNOSIS — Z419 Encounter for procedure for purposes other than remedying health state, unspecified: Secondary | ICD-10-CM | POA: Diagnosis not present

## 2023-08-21 ENCOUNTER — Other Ambulatory Visit: Payer: Self-pay | Admitting: Cardiology

## 2023-08-21 DIAGNOSIS — I10 Essential (primary) hypertension: Secondary | ICD-10-CM

## 2023-08-29 ENCOUNTER — Other Ambulatory Visit: Payer: Self-pay | Admitting: Cardiology

## 2023-08-29 DIAGNOSIS — I10 Essential (primary) hypertension: Secondary | ICD-10-CM

## 2023-09-16 DIAGNOSIS — Z419 Encounter for procedure for purposes other than remedying health state, unspecified: Secondary | ICD-10-CM | POA: Diagnosis not present

## 2023-10-17 DIAGNOSIS — Z419 Encounter for procedure for purposes other than remedying health state, unspecified: Secondary | ICD-10-CM | POA: Diagnosis not present

## 2023-11-05 NOTE — Telephone Encounter (Signed)
Telephone call  

## 2023-11-16 DIAGNOSIS — Z419 Encounter for procedure for purposes other than remedying health state, unspecified: Secondary | ICD-10-CM | POA: Diagnosis not present

## 2023-12-17 DIAGNOSIS — Z419 Encounter for procedure for purposes other than remedying health state, unspecified: Secondary | ICD-10-CM | POA: Diagnosis not present

## 2024-01-17 DIAGNOSIS — Z419 Encounter for procedure for purposes other than remedying health state, unspecified: Secondary | ICD-10-CM | POA: Diagnosis not present

## 2024-02-10 ENCOUNTER — Telehealth: Payer: Self-pay | Admitting: *Deleted

## 2024-02-10 ENCOUNTER — Other Ambulatory Visit: Payer: Self-pay | Admitting: *Deleted

## 2024-02-10 DIAGNOSIS — K862 Cyst of pancreas: Secondary | ICD-10-CM

## 2024-02-10 DIAGNOSIS — Z170 Estrogen receptor positive status [ER+]: Secondary | ICD-10-CM

## 2024-02-10 NOTE — Telephone Encounter (Signed)
 Received a call from centralized scheduling stating pt is out of town and will not be back until the middle of March, CS requested that expected date be changed for MRCP scan. Order was modified, spoke with Peggy from CS and she will call pt to follow up with rescheduling.

## 2024-02-11 ENCOUNTER — Ambulatory Visit (HOSPITAL_COMMUNITY): Payer: No Typology Code available for payment source

## 2024-02-14 DIAGNOSIS — Z419 Encounter for procedure for purposes other than remedying health state, unspecified: Secondary | ICD-10-CM | POA: Diagnosis not present

## 2024-02-24 ENCOUNTER — Other Ambulatory Visit: Payer: Self-pay | Admitting: Hematology and Oncology

## 2024-02-24 DIAGNOSIS — Z9889 Other specified postprocedural states: Secondary | ICD-10-CM

## 2024-03-01 ENCOUNTER — Other Ambulatory Visit: Payer: Self-pay | Admitting: Adult Health

## 2024-03-01 ENCOUNTER — Ambulatory Visit (HOSPITAL_COMMUNITY)
Admission: RE | Admit: 2024-03-01 | Discharge: 2024-03-01 | Disposition: A | Discharge status: Home / Self Care | Payer: No Typology Code available for payment source | Source: Ambulatory Visit | Attending: Adult Health | Admitting: Adult Health

## 2024-03-01 DIAGNOSIS — Z170 Estrogen receptor positive status [ER+]: Secondary | ICD-10-CM | POA: Insufficient documentation

## 2024-03-01 DIAGNOSIS — K862 Cyst of pancreas: Secondary | ICD-10-CM

## 2024-03-01 DIAGNOSIS — C50411 Malignant neoplasm of upper-outer quadrant of right female breast: Secondary | ICD-10-CM

## 2024-03-01 MED ORDER — GADOBUTROL 1 MMOL/ML IV SOLN
10.0000 mL | Freq: Once | INTRAVENOUS | Status: AC | PRN
  Administered 2024-03-01: 10 mL via INTRAVENOUS

## 2024-03-05 ENCOUNTER — Ambulatory Visit
Admission: RE | Admit: 2024-03-05 | Discharge: 2024-03-05 | Disposition: A | Discharge status: Home / Self Care | Source: Ambulatory Visit | Attending: Hematology and Oncology | Admitting: Hematology and Oncology

## 2024-03-05 DIAGNOSIS — Z9889 Other specified postprocedural states: Secondary | ICD-10-CM

## 2024-03-27 DIAGNOSIS — Z419 Encounter for procedure for purposes other than remedying health state, unspecified: Secondary | ICD-10-CM | POA: Diagnosis not present

## 2024-04-26 DIAGNOSIS — Z419 Encounter for procedure for purposes other than remedying health state, unspecified: Secondary | ICD-10-CM | POA: Diagnosis not present

## 2024-05-27 DIAGNOSIS — Z419 Encounter for procedure for purposes other than remedying health state, unspecified: Secondary | ICD-10-CM | POA: Diagnosis not present

## 2024-06-26 DIAGNOSIS — Z419 Encounter for procedure for purposes other than remedying health state, unspecified: Secondary | ICD-10-CM | POA: Diagnosis not present

## 2024-07-27 DIAGNOSIS — Z419 Encounter for procedure for purposes other than remedying health state, unspecified: Secondary | ICD-10-CM | POA: Diagnosis not present

## 2024-08-05 ENCOUNTER — Encounter: Payer: Self-pay | Admitting: Gastroenterology

## 2024-08-27 DIAGNOSIS — Z419 Encounter for procedure for purposes other than remedying health state, unspecified: Secondary | ICD-10-CM | POA: Diagnosis not present

## 2024-09-28 ENCOUNTER — Ambulatory Visit: Admitting: Gastroenterology

## 2024-09-28 ENCOUNTER — Encounter: Payer: Self-pay | Admitting: Gastroenterology

## 2024-09-28 VITALS — BP 126/78 | HR 95 | Ht 64.0 in | Wt 243.0 lb

## 2024-09-28 DIAGNOSIS — Z1211 Encounter for screening for malignant neoplasm of colon: Secondary | ICD-10-CM

## 2024-09-28 DIAGNOSIS — R1310 Dysphagia, unspecified: Secondary | ICD-10-CM

## 2024-09-28 MED ORDER — NA SULFATE-K SULFATE-MG SULF 17.5-3.13-1.6 GM/177ML PO SOLN: 1.0000 | Freq: Once | ORAL | 0 refills | Status: AC

## 2024-09-28 NOTE — Patient Instructions (Signed)
 You have been scheduled for an endoscopy and colonoscopy. Please follow the written instructions given to you at your visit today.  If you use inhalers (even only as needed), please bring them with you on the day of your procedure.  DO NOT TAKE 7 DAYS PRIOR TO TEST- Trulicity (dulaglutide) Ozempic, Wegovy (semaglutide) Mounjaro (tirzepatide) Bydureon Bcise (exanatide extended release)  DO NOT TAKE 1 DAY PRIOR TO YOUR TEST Rybelsus (semaglutide) Adlyxin (lixisenatide) Victoza (liraglutide) Byetta (exanatide) ________________________________________________________________________

## 2024-09-28 NOTE — Progress Notes (Signed)
 09/28/2024 Diane Miranda [id number] 07/27/1976   Discussed the use of AI scribe software for clinical note transcription with the patient, who gave verbal consent to proceed.  History of Present Illness Diane Miranda is a 48 year old female who presents with dysphagia and to discuss screening colonoscopy.  She has experienced food getting stuck in her esophagus for at least three years. This occurs if she does not make food very tiny and chew slowly. Foods with any chunk or thickness, such as meats and breads, tend to get stuck, requiring her to vomit or cough until she regurgitates. This issue is particularly problematic when eating out, as it happens almost every time, especially if she is talking while eating.  She has no issues with liquids, pills, or soft foods like yogurt and mashed potatoes. She does not experience regular heartburn or indigestion. No unintentional weight loss recently, though she did lose weight in the past during her breast cancer treatment due to stress and opting for more liquids to avoid choking.  Her family history includes her father and brother having similar esophageal issues, with her father having undergone esophageal stretching. There is no family history of esophageal, stomach, or colon cancer.  She has never had a colonoscopy in the past.  She says she moves her bowels regularly.  No rectal bleeding.     Past Medical History:  Diagnosis Date   Allergy 2000   Asthma    as child   Breast cancer ([facility])    Family history of lung cancer 03/08/2021   Family history of uterine cancer 03/08/2021   GERD (gastroesophageal reflux disease)    Headache    History of COVID-19    History of radiation therapy    Right breast- 10/09/21-11/29/21- Dr. Lynwood Nasuti   Hyperlipidemia    Hypertension    Port-A-Cath in place 09/11/2021   Pre-diabetes    Prediabetes    right breast ca dx'd 02/2021   Trimalleolar fracture of ankle, closed, left,  initial encounter    Past Surgical History:  Procedure Laterality Date   BREAST BIOPSY Right 03/22/2021   Procedure: PUNCH BIOPSY OF RIGHT AREOLA;  Surgeon: Aron Shoulders, MD;  Location: [facility];  Service: General;  Laterality: Right;   BREAST LUMPECTOMY WITH RADIOACTIVE SEED AND SENTINEL LYMPH NODE BIOPSY Right 08/29/2021   Procedure: RIGHT BREAST LUMPECTOMY WITH RADIOACTIVE SEED AND RIGHT AXILLARY SENTINEL LYMPH NODE BIOPSY;  Surgeon: Aron Shoulders, MD;  [facility];  Service: General;  Laterality: Right;   FRACTURE SURGERY  2018   NO PAST SURGERIES     ORIF ANKLE FRACTURE Left 07/10/2017   Procedure: OPEN REDUCTION INTERNAL FIXATION (ORIF) trimallolar ANKLE FRACTURE;  Surgeon: Kit Rush, MD;  Location: [facility];  Service: Orthopedics;  Laterality: Left;   PORT-A-CATH REMOVAL N/A 06/26/2022   Procedure: PORT REMOVAL;  Surgeon: Aron Shoulders, MD;  Location: [facility];  Service: General;  Laterality: N/A;   PORTACATH PLACEMENT Left 03/22/2021   Procedure: INSERTION PORT-A-CATH;  Surgeon: Aron Shoulders, MD;  Location: [facility];  Service: General;  Laterality: Left;   RADIOACTIVE SEED GUIDED AXILLARY SENTINEL LYMPH NODE Right 08/29/2021   Procedure: RADIOACTIVE SEED GUIDED AXILLARY SENTINEL LYMPH NODE EXCISION;  Surgeon: Aron Shoulders, MD;  Location[facility];  Service: General;  Laterality: Right;    reports that she has never smoked. She has never used smokeless tobacco. She reports current alcohol use. She reports that she does not  use drugs. family history includes Diabetes in her brother and father; Early death in her mother; Hearing loss in her maternal grandmother; Heart disease in her mother; Hypertension in her father, maternal grandmother, and mother; Lung cancer in an other family member; Throat cancer in an other family member; Uterine cancer (age of onset: 67) in an other family member. Allergies  Allergen Reactions   Hydrochlorothiazide  Anaphylaxis and Swelling      Outpatient Encounter Medications as of 09/28/2024  Medication Sig   fexofenadine (ALLEGRA) 60 MG tablet Take 1 tablet by mouth daily as needed (allergies).   losartan  (COZAAR ) 50 MG tablet TAKE 1 TABLET BY MOUTH EVERY EVENING.   metoprolol  succinate (TOPROL -XL) 100 MG 24 hr tablet TAKE 1 TABLET BY MOUTH EVERY DAY IN THE MORNING   Multiple Vitamins-Minerals (MULTIVITAMIN WITH MINERALS) tablet Take 1 tablet by mouth daily. Power   [DISCONTINUED] acetaminophen  (TYLENOL ) 500 MG tablet Take 1 tablet (500 mg total) by mouth 3 (three) times daily with meals as needed. Take with aleve  220 mg tablet   [DISCONTINUED] gabapentin  (NEURONTIN ) 300 MG capsule Take 1 capsule (300 mg total) by mouth at bedtime. (Patient not taking: Reported on 02/03/2023)   No facility-administered encounter medications on file as of 09/28/2024.    REVIEW OF SYSTEMS  : All other systems reviewed and negative except where noted in the History of Present Illness.   PHYSICAL EXAM: BP 126/78   Pulse 95   Ht 5' 4 (1.626 m)   Wt 243 lb (110.2 kg)   SpO2 97%   BMI 41.71 kg/m  General: Well developed AA female in no acute distress Head: Normocephalic and atraumatic Eyes:  Sclerae anicteric, conjunctiva pink. Ears: Normal auditory acuity Lungs: Clear throughout to auscultation; no W/R/R. Heart: Regular rate and rhythm; no M/R/G. Abdomen: Soft, non-distended.  BS present.  Non-tender. Musculoskeletal: Symmetrical with no gross deformities  Skin: No lesions on visible extremities Extremities: No edema  Neurological: Alert oriented x 4, grossly non-focal Psychological:  Alert and cooperative. Normal mood and affect  Assessment & Plan Dysphagia for solids for three years. Possible esophageal stricture. - Perform EGD with esophageal dilation with Dr. Legrand.  Colorectal cancer screening Colorectal cancer screening due. No family history or symptoms suggestive of colorectal cancer. -  Perform colonoscopy for screening. - Remove polyps during colonoscopy and determine follow-up based on characteristics.  The risks, benefits, and alternatives to EGD and colonoscopy were discussed with the patient and she consents to proceed.    CC:  Pavelock, Charlie HERO, MD

## 2024-09-29 NOTE — Progress Notes (Signed)
 ____________________________________________________________  Attending physician addendum:  Thank you for sending this case to me. I have reviewed the entire note and agree with the plan.   Victory Brand, MD  ____________________________________________________________

## 2024-10-27 DIAGNOSIS — Z419 Encounter for procedure for purposes other than remedying health state, unspecified: Secondary | ICD-10-CM | POA: Diagnosis not present

## 2024-10-29 ENCOUNTER — Encounter: Payer: Self-pay | Admitting: Gastroenterology

## 2024-10-29 ENCOUNTER — Ambulatory Visit (AMBULATORY_SURGERY_CENTER): Admitting: Gastroenterology

## 2024-10-29 VITALS — BP 138/98 | HR 97 | Temp 93.0°F | Resp 16 | Ht 64.0 in | Wt 243.0 lb

## 2024-10-29 DIAGNOSIS — K222 Esophageal obstruction: Secondary | ICD-10-CM

## 2024-10-29 DIAGNOSIS — D122 Benign neoplasm of ascending colon: Secondary | ICD-10-CM

## 2024-10-29 DIAGNOSIS — K5730 Diverticulosis of large intestine without perforation or abscess without bleeding: Secondary | ICD-10-CM | POA: Diagnosis not present

## 2024-10-29 DIAGNOSIS — D120 Benign neoplasm of cecum: Secondary | ICD-10-CM | POA: Diagnosis not present

## 2024-10-29 DIAGNOSIS — R1310 Dysphagia, unspecified: Secondary | ICD-10-CM

## 2024-10-29 DIAGNOSIS — Z1211 Encounter for screening for malignant neoplasm of colon: Secondary | ICD-10-CM

## 2024-10-29 DIAGNOSIS — R1319 Other dysphagia: Secondary | ICD-10-CM

## 2024-10-29 DIAGNOSIS — K2100 Gastro-esophageal reflux disease with esophagitis, without bleeding: Secondary | ICD-10-CM

## 2024-10-29 MED ORDER — SODIUM CHLORIDE 0.9 % IV SOLN: 500.0000 mL | Freq: Once | INTRAVENOUS | Status: DC

## 2024-10-29 NOTE — Progress Notes (Signed)
 No significant changes to clinical history since GI office visit on 09/28/24.  The patient is appropriate for an endoscopic procedure in the ambulatory setting.  - Victory Brand, MD

## 2024-10-29 NOTE — Progress Notes (Signed)
1535 Robinul 0.1 mg IV given due large amount of secretions upon assessment.  MD made aware, vss  

## 2024-10-29 NOTE — Progress Notes (Signed)
 Pt's states no medical or surgical changes since previsit or office visit.

## 2024-10-29 NOTE — Progress Notes (Signed)
 Called to room to assist during endoscopic procedure.  Patient ID and intended procedure confirmed with present staff. Received instructions for my participation in the procedure from the performing physician.

## 2024-10-29 NOTE — Patient Instructions (Addendum)
 Resume previous diet Continue present medications Await pathology results  Handouts/information given for polyps, diverticulosis   YOU HAD AN ENDOSCOPIC PROCEDURE TODAY AT THE [facility]:   Refer to the procedure report that was given to you for any specific questions about what was found during the examination.  If the procedure report does not answer your questions, please call your gastroenterologist to clarify.  If you requested that your care partner not be given the details of your procedure findings, then the procedure report has been included in a sealed envelope for you to review at your convenience later.  YOU SHOULD EXPECT: Some feelings of bloating in the abdomen. Passage of more gas than usual.  Walking can help get rid of the air that was put into your GI tract during the procedure and reduce the bloating. If you had a lower endoscopy (such as a colonoscopy or flexible sigmoidoscopy) you may notice spotting of blood in your stool or on the toilet paper. If you underwent a bowel prep for your procedure, you may not have a normal bowel movement for a few days.  Please Note:  You might notice some irritation and congestion in your nose or some drainage.  This is from the oxygen used during your procedure.  There is no need for concern and it should clear up in a day or so.  SYMPTOMS TO REPORT IMMEDIATELY:  Following lower endoscopy (colonoscopy):  Excessive amounts of blood in the stool  Significant tenderness or worsening of abdominal pains  Swelling of the abdomen that is new, acute  Fever of 100F or higher Following upper endoscopy (EGD)  Vomiting of blood or coffee ground material  New chest pain or pain under the shoulder blades  Painful or persistently difficult swallowing  New shortness of breath  Black, tarry-looking stools For urgent or emergent issues, a gastroenterologist can be reached at any hour by calling [phone]. Do not use MyChart messaging  for urgent concerns.   DIET:  We do recommend a small meal at first, but then you may proceed to your regular diet.  Drink plenty of fluids but you should avoid alcoholic beverages for 24 hours.  ACTIVITY:  You should plan to take it easy for the rest of today and you should NOT DRIVE or use heavy machinery until tomorrow (because of the sedation medicines used during the test).    FOLLOW UP: Our staff will call the number listed on your records the next business day following your procedure.  We will call around 7:15- 8:00 am to check on you and address any questions or concerns that you may have regarding the information given to you following your procedure. If we do not reach you, we will leave a message.     If any biopsies were taken you will be contacted by phone or by letter within the next 1-3 weeks.  Please call us  at [phone] if you have not heard about the biopsies in 3 weeks.    SIGNATURES/CONFIDENTIALITY: You and/or your care partner have signed paperwork which will be entered into your electronic medical record.  These signatures attest to the fact that that the information above on your After Visit Summary has been reviewed and is understood.  Full responsibility of the confidentiality of this discharge information lies with you and/or your care-partner.

## 2024-10-29 NOTE — Progress Notes (Signed)
 1613  Pt experienced laryngeal spasm with jaw thrust performed.

## 2024-10-29 NOTE — Op Note (Signed)
 [facility] Patient Name: Diane Miranda Procedure Date: 10/29/2024 3:01 PM MRN: [id number] Endoscopist: Victory L. Legrand , MD, [id number] Age: 48 Referring MD:  Date of Birth: August 25, 1976 Gender: Female Account #: [id number] Procedure:                Colonoscopy Indications:              Screening for colorectal malignant neoplasm, This                            is the patient's first colonoscopy Medicines:                Monitored Anesthesia Care Procedure:                Pre-Anesthesia Assessment:                           - Prior to the procedure, a History and Physical                            was performed, and patient medications and                            allergies were reviewed. The patient's tolerance of                            previous anesthesia was also reviewed. The risks                            and benefits of the procedure and the sedation                            options and risks were discussed with the patient.                            All questions were answered, and informed consent                            was obtained. Prior Anticoagulants: The patient has                            taken no anticoagulant or antiplatelet agents. ASA                            Grade Assessment: III - A patient with severe                            systemic disease. After reviewing the risks and                            benefits, the patient was deemed in satisfactory                            condition to undergo the procedure.  After obtaining informed consent, the colonoscope                            was passed under direct vision. Throughout the                            procedure, the patient's blood pressure, pulse, and                            oxygen saturations were monitored continuously. The                            CF [id number] #[id number] was introduced through the anus                            and advanced  to the the cecum, identified by                            appendiceal orifice and ileocecal valve. The                            colonoscopy was somewhat difficult due to a                            redundant colon. Successful completion of the                            procedure was aided by using manual pressure and                            straightening and shortening the scope to obtain                            bowel loop reduction. The patient tolerated the                            procedure well. The quality of the bowel                            preparation was good. The ileocecal valve,                            appendiceal orifice, and rectum were photographed. Scope In: 3:42:03 PM Scope Out: 3:53:47 PM Scope Withdrawal Time: 0 hours 9 minutes 18 seconds  Total Procedure Duration: 0 hours 11 minutes 44 seconds  Findings:                 The perianal and digital rectal examinations were                            normal.                           Repeat examination of right colon under NBI  performed.                           Two sessile polyps were found in the ascending                            colon and cecum. The polyps were 4 to 6 mm in size.                            These polyps were removed with a cold snare.                            Resection and retrieval were complete.                           Multiple diverticula were found in the left colon.                           The exam was otherwise without abnormality on                            direct and retroflexion views. Complications:            No immediate complications. Estimated Blood Loss:     Estimated blood loss was minimal. Impression:               - Two 4 to 6 mm polyps in the ascending colon and                            in the cecum, removed with a cold snare. Resected                            and retrieved.                           - Diverticulosis in the  left colon.                           - The examination was otherwise normal on direct                            and retroflexion views. Recommendation:           - Patient has a contact number available for                            emergencies. The signs and symptoms of potential                            delayed complications were discussed with the                            patient. Return to normal activities tomorrow.  Written discharge instructions were provided to the                            patient.                           - Resume previous diet.                           - Continue present medications.                           - Await pathology results.                           - Repeat colonoscopy is recommended for                            surveillance. The colonoscopy date will be                            determined after pathology results from today's                            exam become available for review. [name] L. Legrand, MD 10/29/2024 4:21:11 PM This report has been signed electronically.

## 2024-10-29 NOTE — Progress Notes (Signed)
 1615 Report given to PACU, vss

## 2024-10-29 NOTE — Op Note (Signed)
 [facility] Patient Name: Diane Miranda Procedure Date: 10/29/2024 3:01 PM MRN: [id number] Endoscopist: Victory L. Legrand , MD, [id number] Age: 48 Referring MD:  Date of Birth: 20-Jan-1976 Gender: Female Account #: [id number] Procedure:                Upper GI endoscopy Indications:              Esophageal dysphagia Medicines:                Monitored Anesthesia Care Procedure:                Pre-Anesthesia Assessment:                           - Prior to the procedure, a History and Physical                            was performed, and patient medications and                            allergies were reviewed. The patient's tolerance of                            previous anesthesia was also reviewed. The risks                            and benefits of the procedure and the sedation                            options and risks were discussed with the patient.                            All questions were answered, and informed consent                            was obtained. Prior Anticoagulants: The patient has                            taken no anticoagulant or antiplatelet agents. ASA                            Grade Assessment: III - A patient with severe                            systemic disease. After reviewing the risks and                            benefits, the patient was deemed in satisfactory                            condition to undergo the procedure.                           After obtaining informed consent, the endoscope was  passed under direct vision. Throughout the                            procedure, the patient's blood pressure, pulse, and                            oxygen saturations were monitored continuously. The                            Olympus Scope [id number] was introduced through the                            mouth, and advanced to the second part of duodenum.                            The upper GI endoscopy  was accomplished without                            difficulty. The patient tolerated the procedure. Scope In: Scope Out: Findings:                 One benign-appearing, intrinsic moderate stenosis                            was found at the gastroesophageal junction. This                            stenosis measured 1.2 cm (inner diameter) x less                            than one cm (in length). The stenosis was                            traversed. A guidewire was placed and the scope was                            withdrawn. Dilation was performed with a Savary                            dilator with mild resistance at 14 mm and moderate                            resistance at 16 mm. The dilation site was examined                            and showed moderate mucosal disruption and moderate                            improvement in luminal narrowing.                           Normal mucosa was found in the entire esophagus.  Several biopsies were obtained in the upper third                            of the esophagus and in the lower third of the                            esophagus with cold forceps for histology.                            (Evaluate for microscopic changes of GERD or EOE)                           Negative tug sign with biopsies.                           The stomach was normal.                           The cardia and gastric fundus were normal on                            retroflexion.                           The examined duodenum was normal. Complications:            No immediate complications. Estimated Blood Loss:     Estimated blood loss was minimal. Impression:               - Benign-appearing esophageal stenosis.                           - Normal mucosa was found in the entire esophagus.                           - Normal stomach.                           - Normal examined duodenum.                           - Several  biopsies were obtained in the upper third                            of the esophagus and in the lower third of the                            esophagus. Recommendation:           - Patient has a contact number available for                            emergencies. The signs and symptoms of potential                            delayed complications were discussed with the  patient. Return to normal activities tomorrow.                            Written discharge instructions were provided to the                            patient.                           - Resume previous diet.                           - Continue present medications.                           - Await pathology results.                           - See the other procedure note for documentation of                            additional recommendations.                           - Return to my office PRN. Eleshia Wooley L. Legrand, MD 10/29/2024 4:27:01 PM This report has been signed electronically.

## 2024-11-01 ENCOUNTER — Telehealth: Payer: Self-pay | Admitting: *Deleted

## 2024-11-01 NOTE — Telephone Encounter (Signed)
  Follow up Call-     10/29/2024    2:13 PM  Call back number  Post procedure Call Back phone  # [phone]  Permission to leave phone message Yes     Patient questions:  Do you have a fever, pain , or abdominal swelling? No. Pain Score  0 *  Have you tolerated food without any problems? Yes.    Have you been able to return to your normal activities? Yes.    Do you have any questions about your discharge instructions: Diet   No. Medications  No. Follow up visit  No.  Do you have questions or concerns about your Care? No.  Actions: * If pain score is 4 or above: No action needed, pain <4.

## 2024-11-03 LAB — SURGICAL PATHOLOGY

## 2024-11-05 ENCOUNTER — Ambulatory Visit: Payer: Self-pay | Admitting: Gastroenterology

## 2024-11-05 ENCOUNTER — Other Ambulatory Visit: Payer: Self-pay | Admitting: Gastroenterology

## 2024-11-05 DIAGNOSIS — K219 Gastro-esophageal reflux disease without esophagitis: Secondary | ICD-10-CM

## 2024-11-05 MED ORDER — OMEPRAZOLE 20 MG PO CPDR: 20.0000 mg | DELAYED_RELEASE_CAPSULE | Freq: Every day | ORAL | 3 refills | Status: AC
